# Patient Record
Sex: Female | Born: 1935 | Race: White | Hispanic: No | Marital: Married | Smoking: Never smoker
Health system: Southern US, Community
[De-identification: ages and names within clinical notes are randomized; demographics above are authoritative.]

## PROBLEM LIST (undated history)

## (undated) DIAGNOSIS — I2699 Other pulmonary embolism without acute cor pulmonale: Secondary | ICD-10-CM

## (undated) DIAGNOSIS — F32A Depression, unspecified: Secondary | ICD-10-CM

## (undated) DIAGNOSIS — L03032 Cellulitis of left toe: Secondary | ICD-10-CM

## (undated) DIAGNOSIS — E785 Hyperlipidemia, unspecified: Secondary | ICD-10-CM

## (undated) DIAGNOSIS — Q211 Atrial septal defect: Secondary | ICD-10-CM

## (undated) DIAGNOSIS — Z9289 Personal history of other medical treatment: Secondary | ICD-10-CM

## (undated) DIAGNOSIS — Q2112 Patent foramen ovale: Secondary | ICD-10-CM

## (undated) DIAGNOSIS — N189 Chronic kidney disease, unspecified: Secondary | ICD-10-CM

## (undated) DIAGNOSIS — L02612 Cutaneous abscess of left foot: Secondary | ICD-10-CM

## (undated) DIAGNOSIS — I10 Essential (primary) hypertension: Secondary | ICD-10-CM

## (undated) DIAGNOSIS — R002 Palpitations: Secondary | ICD-10-CM

## (undated) DIAGNOSIS — I639 Cerebral infarction, unspecified: Secondary | ICD-10-CM

## (undated) DIAGNOSIS — M1990 Unspecified osteoarthritis, unspecified site: Secondary | ICD-10-CM

## (undated) DIAGNOSIS — K219 Gastro-esophageal reflux disease without esophagitis: Secondary | ICD-10-CM

## (undated) DIAGNOSIS — K449 Diaphragmatic hernia without obstruction or gangrene: Secondary | ICD-10-CM

## (undated) DIAGNOSIS — F329 Major depressive disorder, single episode, unspecified: Secondary | ICD-10-CM

## (undated) HISTORY — DX: Palpitations: R00.2

## (undated) HISTORY — DX: Atrial septal defect: Q21.1

## (undated) HISTORY — DX: Personal history of other medical treatment: Z92.89

## (undated) HISTORY — PX: CHOLECYSTECTOMY: SHX55

## (undated) HISTORY — DX: Diaphragmatic hernia without obstruction or gangrene: K44.9

## (undated) HISTORY — DX: Hyperlipidemia, unspecified: E78.5

## (undated) HISTORY — DX: Patent foramen ovale: Q21.12

---

## 2000-04-29 ENCOUNTER — Encounter: Admission: RE | Admit: 2000-04-29 | Discharge: 2000-04-29 | Payer: Self-pay | Admitting: *Deleted

## 2000-04-29 ENCOUNTER — Encounter: Payer: Self-pay | Admitting: *Deleted

## 2001-05-05 ENCOUNTER — Encounter: Payer: Self-pay | Admitting: *Deleted

## 2001-05-05 ENCOUNTER — Encounter: Admission: RE | Admit: 2001-05-05 | Discharge: 2001-05-05 | Payer: Self-pay | Admitting: *Deleted

## 2001-07-23 ENCOUNTER — Ambulatory Visit (HOSPITAL_COMMUNITY): Admission: RE | Admit: 2001-07-23 | Discharge: 2001-07-23 | Payer: Self-pay | Admitting: Family Medicine

## 2001-07-23 ENCOUNTER — Other Ambulatory Visit: Admission: RE | Admit: 2001-07-23 | Discharge: 2001-07-23 | Payer: Self-pay | Admitting: Family Medicine

## 2001-07-23 ENCOUNTER — Encounter: Payer: Self-pay | Admitting: Family Medicine

## 2001-07-24 ENCOUNTER — Encounter: Payer: Self-pay | Admitting: Family Medicine

## 2001-07-24 ENCOUNTER — Ambulatory Visit (HOSPITAL_COMMUNITY): Admission: RE | Admit: 2001-07-24 | Discharge: 2001-07-24 | Payer: Self-pay | Admitting: Family Medicine

## 2001-08-01 ENCOUNTER — Encounter (INDEPENDENT_AMBULATORY_CARE_PROVIDER_SITE_OTHER): Payer: Self-pay | Admitting: Specialist

## 2001-08-01 ENCOUNTER — Other Ambulatory Visit: Admission: RE | Admit: 2001-08-01 | Discharge: 2001-08-01 | Payer: Self-pay | Admitting: *Deleted

## 2001-10-13 ENCOUNTER — Ambulatory Visit (HOSPITAL_COMMUNITY): Admission: RE | Admit: 2001-10-13 | Discharge: 2001-10-13 | Payer: Self-pay | Admitting: Gastroenterology

## 2002-02-04 ENCOUNTER — Ambulatory Visit (HOSPITAL_COMMUNITY): Admission: RE | Admit: 2002-02-04 | Discharge: 2002-02-04 | Payer: Self-pay | Admitting: Family Medicine

## 2002-02-04 ENCOUNTER — Encounter: Payer: Self-pay | Admitting: Family Medicine

## 2002-05-12 ENCOUNTER — Encounter: Payer: Self-pay | Admitting: Family Medicine

## 2002-05-12 ENCOUNTER — Encounter: Admission: RE | Admit: 2002-05-12 | Discharge: 2002-05-12 | Payer: Self-pay | Admitting: Family Medicine

## 2002-07-17 ENCOUNTER — Emergency Department (HOSPITAL_COMMUNITY): Admission: EM | Admit: 2002-07-17 | Discharge: 2002-07-17 | Payer: Self-pay | Admitting: Emergency Medicine

## 2002-09-29 ENCOUNTER — Encounter: Payer: Self-pay | Admitting: Family Medicine

## 2002-09-29 ENCOUNTER — Ambulatory Visit (HOSPITAL_COMMUNITY): Admission: RE | Admit: 2002-09-29 | Discharge: 2002-09-29 | Payer: Self-pay | Admitting: Family Medicine

## 2002-10-03 HISTORY — PX: CARDIAC CATHETERIZATION: SHX172

## 2002-10-05 ENCOUNTER — Ambulatory Visit (HOSPITAL_COMMUNITY): Admission: RE | Admit: 2002-10-05 | Discharge: 2002-10-05 | Payer: Self-pay | Admitting: Family Medicine

## 2002-10-26 ENCOUNTER — Ambulatory Visit (HOSPITAL_COMMUNITY): Admission: RE | Admit: 2002-10-26 | Discharge: 2002-10-26 | Payer: Self-pay | Admitting: Cardiology

## 2002-10-26 ENCOUNTER — Encounter: Payer: Self-pay | Admitting: Cardiology

## 2002-11-12 ENCOUNTER — Ambulatory Visit (HOSPITAL_COMMUNITY): Admission: RE | Admit: 2002-11-12 | Discharge: 2002-11-12 | Payer: Self-pay | Admitting: *Deleted

## 2003-05-14 ENCOUNTER — Encounter: Payer: Self-pay | Admitting: Family Medicine

## 2003-05-14 ENCOUNTER — Encounter: Admission: RE | Admit: 2003-05-14 | Discharge: 2003-05-14 | Payer: Self-pay | Admitting: Family Medicine

## 2003-08-18 ENCOUNTER — Encounter: Payer: Self-pay | Admitting: Family Medicine

## 2003-08-18 ENCOUNTER — Ambulatory Visit (HOSPITAL_COMMUNITY): Admission: RE | Admit: 2003-08-18 | Discharge: 2003-08-18 | Payer: Self-pay | Admitting: Family Medicine

## 2003-08-23 ENCOUNTER — Ambulatory Visit (HOSPITAL_COMMUNITY): Admission: RE | Admit: 2003-08-23 | Discharge: 2003-08-23 | Payer: Self-pay | Admitting: Family Medicine

## 2003-08-23 ENCOUNTER — Encounter: Payer: Self-pay | Admitting: Family Medicine

## 2003-12-16 ENCOUNTER — Inpatient Hospital Stay (HOSPITAL_COMMUNITY): Admission: AD | Admit: 2003-12-16 | Discharge: 2003-12-18 | Payer: Self-pay | Admitting: Family Medicine

## 2003-12-20 ENCOUNTER — Ambulatory Visit (HOSPITAL_COMMUNITY): Admission: RE | Admit: 2003-12-20 | Discharge: 2003-12-20 | Payer: Self-pay | Admitting: Family Medicine

## 2003-12-24 ENCOUNTER — Ambulatory Visit (HOSPITAL_BASED_OUTPATIENT_CLINIC_OR_DEPARTMENT_OTHER): Admission: RE | Admit: 2003-12-24 | Discharge: 2003-12-24 | Payer: Self-pay | Admitting: Urology

## 2004-05-15 ENCOUNTER — Encounter: Admission: RE | Admit: 2004-05-15 | Discharge: 2004-05-15 | Payer: Self-pay | Admitting: Family Medicine

## 2004-09-13 ENCOUNTER — Ambulatory Visit (HOSPITAL_COMMUNITY): Admission: RE | Admit: 2004-09-13 | Discharge: 2004-09-13 | Payer: Self-pay | Admitting: Family Medicine

## 2005-05-16 ENCOUNTER — Encounter: Admission: RE | Admit: 2005-05-16 | Discharge: 2005-05-16 | Payer: Self-pay | Admitting: Family Medicine

## 2005-09-26 ENCOUNTER — Ambulatory Visit (HOSPITAL_COMMUNITY): Admission: RE | Admit: 2005-09-26 | Discharge: 2005-09-26 | Payer: Self-pay | Admitting: Family Medicine

## 2006-05-20 ENCOUNTER — Encounter: Admission: RE | Admit: 2006-05-20 | Discharge: 2006-05-20 | Payer: Self-pay | Admitting: Family Medicine

## 2006-10-01 ENCOUNTER — Ambulatory Visit (HOSPITAL_COMMUNITY): Admission: RE | Admit: 2006-10-01 | Discharge: 2006-10-01 | Payer: Self-pay | Admitting: Family Medicine

## 2006-10-16 ENCOUNTER — Encounter (INDEPENDENT_AMBULATORY_CARE_PROVIDER_SITE_OTHER): Payer: Self-pay | Admitting: Specialist

## 2006-10-16 ENCOUNTER — Ambulatory Visit (HOSPITAL_COMMUNITY): Admission: RE | Admit: 2006-10-16 | Discharge: 2006-10-16 | Payer: Self-pay | Admitting: Gastroenterology

## 2006-12-03 DIAGNOSIS — Z9289 Personal history of other medical treatment: Secondary | ICD-10-CM

## 2006-12-03 HISTORY — DX: Personal history of other medical treatment: Z92.89

## 2007-05-22 ENCOUNTER — Encounter: Admission: RE | Admit: 2007-05-22 | Discharge: 2007-05-22 | Payer: Self-pay | Admitting: Family Medicine

## 2007-09-14 ENCOUNTER — Emergency Department (HOSPITAL_COMMUNITY): Admission: EM | Admit: 2007-09-14 | Discharge: 2007-09-14 | Payer: Self-pay | Admitting: Emergency Medicine

## 2007-09-19 ENCOUNTER — Ambulatory Visit (HOSPITAL_COMMUNITY): Admission: RE | Admit: 2007-09-19 | Discharge: 2007-09-19 | Payer: Self-pay | Admitting: Family Medicine

## 2007-09-23 ENCOUNTER — Encounter (HOSPITAL_COMMUNITY): Admission: RE | Admit: 2007-09-23 | Discharge: 2007-10-23 | Payer: Self-pay | Admitting: Family Medicine

## 2007-10-13 DIAGNOSIS — Z9289 Personal history of other medical treatment: Secondary | ICD-10-CM

## 2007-10-13 HISTORY — DX: Personal history of other medical treatment: Z92.89

## 2007-10-15 ENCOUNTER — Observation Stay (HOSPITAL_COMMUNITY): Admission: RE | Admit: 2007-10-15 | Discharge: 2007-10-16 | Payer: Self-pay | Admitting: General Surgery

## 2007-10-15 ENCOUNTER — Encounter (INDEPENDENT_AMBULATORY_CARE_PROVIDER_SITE_OTHER): Payer: Self-pay | Admitting: General Surgery

## 2007-11-24 ENCOUNTER — Ambulatory Visit (HOSPITAL_COMMUNITY): Admission: RE | Admit: 2007-11-24 | Discharge: 2007-11-24 | Payer: Self-pay | Admitting: Family Medicine

## 2008-05-24 ENCOUNTER — Encounter: Admission: RE | Admit: 2008-05-24 | Discharge: 2008-05-24 | Payer: Self-pay | Admitting: Family Medicine

## 2009-05-25 ENCOUNTER — Encounter: Admission: RE | Admit: 2009-05-25 | Discharge: 2009-05-25 | Payer: Self-pay | Admitting: Family Medicine

## 2010-01-09 ENCOUNTER — Ambulatory Visit (HOSPITAL_COMMUNITY): Admission: RE | Admit: 2010-01-09 | Discharge: 2010-01-09 | Payer: Self-pay | Admitting: Family Medicine

## 2010-05-29 ENCOUNTER — Encounter: Admission: RE | Admit: 2010-05-29 | Discharge: 2010-05-29 | Payer: Self-pay | Admitting: Family Medicine

## 2010-12-18 ENCOUNTER — Emergency Department (HOSPITAL_COMMUNITY)
Admission: EM | Admit: 2010-12-18 | Discharge: 2010-12-18 | Payer: Self-pay | Source: Home / Self Care | Admitting: Emergency Medicine

## 2010-12-20 LAB — BASIC METABOLIC PANEL
BUN: 16 mg/dL (ref 6–23)
CO2: 27 mEq/L (ref 19–32)
Calcium: 9.4 mg/dL (ref 8.4–10.5)
Chloride: 106 mEq/L (ref 96–112)
Creatinine, Ser: 0.89 mg/dL (ref 0.4–1.2)
GFR calc Af Amer: >60 mL/min (ref >60)
GFR calc non Af Amer: >60 mL/min (ref >60)
Glucose, Bld: 114 mg/dL — ABNORMAL HIGH (ref 70–99)
Potassium: 4.3 mEq/L (ref 3.5–5.1)
Sodium: 139 mEq/L (ref 135–145)

## 2010-12-20 LAB — DIFFERENTIAL
Basophils Absolute: 0 10*3/uL (ref 0.0–0.1)
Basophils Relative: 0 % (ref 0–1)
Eosinophils Absolute: 0.1 10*3/uL (ref 0.0–0.7)
Eosinophils Relative: 1 % (ref 0–5)
Lymphocytes Relative: 14 % (ref 12–46)
Lymphs Abs: 1.2 10*3/uL (ref 0.7–4.0)
Monocytes Absolute: 0.4 10*3/uL (ref 0.1–1.0)
Monocytes Relative: 4 % (ref 3–12)
Neutro Abs: 6.8 10*3/uL (ref 1.7–7.7)
Neutrophils Relative %: 81 % — ABNORMAL HIGH (ref 43–77)

## 2010-12-20 LAB — URINALYSIS, ROUTINE W REFLEX MICROSCOPIC
Bilirubin Urine: NEGATIVE
Leukocytes, UA: NEGATIVE
Nitrite: NEGATIVE
Protein, ur: 30 mg/dL — AB
Specific Gravity, Urine: >1.03 — ABNORMAL HIGH (ref 1.005–1.030)
Urine Glucose, Fasting: NEGATIVE mg/dL
Urobilinogen, UA: 0.2 mg/dL (ref 0.0–1.0)
pH: 5.5 (ref 5.0–8.0)

## 2010-12-20 LAB — CBC
HCT: 36.4 % (ref 36.0–46.0)
Hemoglobin: 12.4 g/dL (ref 12.0–15.0)
MCH: 31.4 pg (ref 26.0–34.0)
MCHC: 34.1 g/dL (ref 30.0–36.0)
MCV: 92.2 fL (ref 78.0–100.0)
Platelets: 204 10*3/uL (ref 150–400)
RBC: 3.95 MIL/uL (ref 3.87–5.11)
RDW: 12.3 % (ref 11.5–15.5)
WBC: 8.5 10*3/uL (ref 4.0–10.5)

## 2010-12-20 LAB — URINE MICROSCOPIC-ADD ON

## 2011-04-05 ENCOUNTER — Ambulatory Visit (HOSPITAL_COMMUNITY)
Admission: RE | Admit: 2011-04-05 | Discharge: 2011-04-05 | Disposition: A | Discharge status: Home / Self Care | Payer: Medicare Other | Source: Ambulatory Visit | Attending: Gastroenterology | Admitting: Gastroenterology

## 2011-04-05 DIAGNOSIS — K219 Gastro-esophageal reflux disease without esophagitis: Secondary | ICD-10-CM | POA: Insufficient documentation

## 2011-04-05 DIAGNOSIS — Z79899 Other long term (current) drug therapy: Secondary | ICD-10-CM | POA: Insufficient documentation

## 2011-04-05 DIAGNOSIS — K2960 Other gastritis without bleeding: Secondary | ICD-10-CM | POA: Insufficient documentation

## 2011-04-05 DIAGNOSIS — E7800 Pure hypercholesterolemia, unspecified: Secondary | ICD-10-CM | POA: Insufficient documentation

## 2011-04-05 DIAGNOSIS — Z7982 Long term (current) use of aspirin: Secondary | ICD-10-CM | POA: Insufficient documentation

## 2011-04-05 DIAGNOSIS — M1990 Unspecified osteoarthritis, unspecified site: Secondary | ICD-10-CM | POA: Insufficient documentation

## 2011-04-05 DIAGNOSIS — R195 Other fecal abnormalities: Secondary | ICD-10-CM | POA: Insufficient documentation

## 2011-04-05 DIAGNOSIS — R1310 Dysphagia, unspecified: Secondary | ICD-10-CM | POA: Insufficient documentation

## 2011-04-05 DIAGNOSIS — I10 Essential (primary) hypertension: Secondary | ICD-10-CM | POA: Insufficient documentation

## 2011-04-05 DIAGNOSIS — K222 Esophageal obstruction: Secondary | ICD-10-CM | POA: Insufficient documentation

## 2011-04-15 NOTE — Op Note (Signed)
Cassandra [name], [name] NO.:  [id number]  MEDICAL RECORD NO.:  [id number]           PATIENT TYPE:  O  LOCATION:  WLEN                         FACILITY:  [facility]  PHYSICIAN:  Danise Edge, M.D.   DATE OF BIRTH:  May 26, 1933  DATE OF PROCEDURE:  04/05/2011 DATE OF DISCHARGE:                              OPERATIVE REPORT   PROCEDURES DONE:  Esophagogastroduodenoscopy with esophageal stricture dilation and diagnostic colonoscopy.  REFERRING PHYSICIAN:  Edsel Petrin, D.O.  HISTORY:  [name] is a 77 year old female born 12-30-1932. The patient has chronic gastroesophageal reflux disease associated with a benign peptic stricture at the esophagogastric junction which required esophageal dilation in 2007.  In 2002, the patient underwent a normal screening colonoscopy.  The patient is experiencing intermittent solid food esophageal dysphagia and is scheduled to undergo an esophagogastroduodenoscopy with balloon dilation of the distal esophageal stricture.  She recently underwent her complete physical examination.  She submitted stool for Hemoccult testing.  Her stool returned positive for blood. She reports no difficulty having bowel movements and reports no blood in her stool.  The patient chronically takes aspirin 81 mg daily and naproxen 500 mg twice daily.  There is no history of peptic ulcer disease.  ENDOSCOPIST:  Danise Edge, M.D.  PREMEDICATIONS:  Propofol administered by Anesthesia.  PROCEDURE:  Esophagogastroduodenoscopy:  The patient was placed in the left lateral decubitus position.  The Pentax gastroscope was passed through the posterior hypopharynx into the proximal esophagus without difficulty.  The hypopharynx, larynx and vocal cords appeared normal.  Esophagoscopy:  The proximal and mid segments of the esophagus appeared normal.  There is a shallow superficial benign peptic stricture at the esophagogastric junction noted  at approximately 40 cm from the incisor teeth.  There is no endoscopic evidence for the presence of erosive esophagitis or Barrett's esophagus.  The esophageal stricture was dilated to 16.5 mm using the CRE balloon dilator.  Gastroscopy:  Retroflex view of the gastric cardia and fundus was normal.  The gastric body appeared normal.  In the gastric antrum, there were a few shallow prepyloric gastric erosions.  The pylorus appears normal.  Duodenoscopy:  The duodenal bulb and descending duodenum appeared normal.  ASSESSMENT: 1. Benign peptic stricture at the esophagogastric junction, dilated to     16.5 mm with the CRE balloon dilator. 2. Several small superficial mucosal erosions are noted in the     prepyloric gastric antrum probably related to the use of aspirin     and naproxen.  PROCEDURE:  Diagnostic colonoscopy:  Anal inspection and digital rectal exam were normal.  The Pentax pediatric colonoscope was introduced into the rectum and easily advanced to the cecum.  A normal-appearing ileocecal valve and appendiceal orifice were identified.  Colonic preparation for the exam today was good.  Rectum normal:  Retroflex view of the distal rectum normal. Sigmoid colon and descending colon normal. Splenic flexure normal. Transverse colon normal. Hepatic flexure normal. Ascending colon normal. Cecum and ileocecal valve normal.  ASSESSMENT:  Normal proctocolonoscopy to the cecum.  RECOMMENDATIONS:  Repeat screening colonoscopy is probably not necessary.  ______________________________ Danise Edge, M.D.     MJ/MEDQ  D:  04/05/2011  T:  04/05/2011  Job:  [id number]  cc:   Edsel Petrin, D.O. Fax: [phone]  Electronically Signed by Danise Edge M.D. on 04/15/2011 08:53:09 AM

## 2011-04-17 NOTE — Op Note (Signed)
NAMEARNICE, VANEPPS NO.:  [id number]   MEDICAL RECORD NO.:  [id number]          PATIENT TYPE:  OBV   LOCATION:  A339                          FACILITY:  [facility]   PHYSICIAN:  Dalia Heading, M.D.  DATE OF BIRTH:  Aug 05, 1933   DATE OF PROCEDURE:  10/15/2007  DATE OF DISCHARGE:                               OPERATIVE REPORT   PREOPERATIVE DIAGNOSIS:  Chronic cholecystitis.   POSTOPERATIVE DIAGNOSIS:  Chronic cholecystitis.   PROCEDURE:  Laparoscopic cholecystectomy.   SURGEON:  Franky Macho, M.D.   ANESTHESIA:  General endotracheal.   INDICATIONS:  The patient is a 77 year old white female who presents  with biliary colic secondary to chronic cholecystitis.  The risks and  benefits of the procedure including bleeding, infection, hepatobiliary  injury, and the possibly of an open procedure were fully explained to  the patient, who gave informed consent.   PROCEDURE NOTE:  The patient was placed in the supine position.  After  induction of general endotracheal anesthesia, the abdomen was prepped  and draped using the usual sterile technique with Betadine.  Surgical  site confirmation was performed.   An infraumbilical incision was made down to the fascia.  Veress needle  was introduced into the abdominal cavity and confirmation of placement  was done using the saline drop test.  The abdomen was then insufflated  to 16 mmHg pressure.  An 11 mm trocar was introduced into the abdominal  cavity under direct visualization without difficulty.  The patient was  placed in reverse Trendelenburg position.  An additional 11 mm trocar  was placed in the epigastric region and 5 mm trocars placed in the right  upper quadrant and right flank regions.  The liver was inspected and  noted to be within normal limits.  The gallbladder was retracted  superior and laterally.  The dissection was begun around the  infundibulum of the gallbladder.  The cystic duct was first  identified.  Juncture to the infundibulum fully identified.  Endo clips were placed  proximally and distally on the cystic duct and the cystic duct was  divided.  This likewise done on the cystic artery.  The gallbladder was  then freed away from the gallbladder fossa using Bovie electrocautery.  The gallbladder was delivered through the epigastric trocar site using  EndoCatch bag.  The gallbladder fossa was inspected.  No abnormal  bleeding or bile leakage was noted.  Surgicel was placed in the  gallbladder fossa.  All fluid and air were then evacuated from the  abdominal cavity prior to removal of the trocars.   All wounds were irrigated with normal saline.  All wounds were injected  with 0.5% Sensorcaine.  The infraumbilical fascia was reapproximated  using an 0 Vicryl interrupted suture.  All skin incisions were closed  using staples.  Betadine ointment and dry sterile dressings were  applied.   All tape and needle counts correct at the end of the procedure.  The  patient was extubated in the operating room and went back to recovery  room awake in stable condition.   COMPLICATIONS:  None.  SPECIMEN:  Gallbladder.   BLOOD LOSS:  Minimal.      Dalia Heading, M.D.  Electronically Signed     MAJ/MEDQ  D:  10/15/2007  T:  10/16/2007  Job:  [id number]   cc:   Dalia Heading, M.D.  Fax: [phone]   Dr. Venia Carbon, M.D.  Fax: [phone]

## 2011-04-17 NOTE — H&P (Signed)
NAMEAMBERLY, Cassandra [name]             ACCOUNT NO.:  [id number]   MEDICAL RECORD NO.:  [id number]          PATIENT TYPE:  AMB   LOCATION:  DAY                           FACILITY:  [facility]   PHYSICIAN:  Dalia Heading, M.D.  DATE OF BIRTH:  [birth year]   DATE OF ADMISSION:  DATE OF DISCHARGE:  LH                              HISTORY & PHYSICAL   CHIEF COMPLAINT:  Chronic cholecystitis.   HISTORY OF PRESENT ILLNESS:  The patient is a 77 year old white female  who was referred for evaluation and treatment of biliary colic secondary  to chronic cholecystitis.  She has been having right upper quadrant  abdominal pain, nausea, bloating for many weeks.  She does have fatty  food intolerance.  No fever, chills or jaundice had been noted.   PAST MEDICAL HISTORY:  1. Coronary artery disease.  2. Hypertension.  3. Chronic back pain.   PAST SURGICAL HISTORY:  Unremarkable.   CURRENT MEDICATIONS:  1. Metoprolol.  2. Nitrofurantoin.  3. Tramadol.  4. Promethazine.  5. Naprosyn.  6. Baby aspirin.   ALLERGIES:  No known drug allergies.   REVIEW OF SYSTEMS:  The patient denies drinking or smoking.  She denies  any recent chest pain, MI, CVA or diabetes mellitus.  She denies any  bleeding disorders.   PHYSICAL EXAMINATION:  GENERAL:  The patient is a well-developed, well-  nourished white female in no acute distress.  HEENT:  Examination reveals no scleral icterus.  LUNGS:  Clear to auscultation with equal breath sounds bilaterally.  HEART:  Examination reveals a regular rate and rhythm without S3, S4 or  murmurs.  ABDOMEN:  Soft and nondistended.  She is tender in the right upper  quadrant to palpation.  No hepatosplenomegaly, masses or hernias  identified.   DIAGNOSTIC DATA:  HIDA scan reveals chronic cholecystitis with a low  gallbladder ejection fraction.   IMPRESSION:  Chronic cholecystitis.   PLAN:  The patient is scheduled for laparoscopic cholecystectomy on  October 15, 2007.   The risks and benefits of the procedure including  bleeding, infection, hepatobiliary injury and the possibility of an open  procedure were fully explained to the patient, who gave informed  consent.  She is having a cardiac stress test by Dr. Domingo Sep prior to  surgery.      Dalia Heading, M.D.  Electronically Signed     MAJ/MEDQ  D:  10/09/2007  T:  10/09/2007  Job:  [id number]   cc:   Dalia Heading, M.D.  Fax: [phone]   Patrica Duel, M.D.  Fax: [phone]   Dani Gobble, MD  Fax: [phone]

## 2011-04-20 NOTE — H&P (Signed)
NAME:  Cassandra [name], Cassandra [name]                       ACCOUNT NO.:  [id number]   MEDICAL RECORD NO.:  [id number]                   PATIENT TYPE:  INP   LOCATION:  A338                                 FACILITY:  [facility]   PHYSICIAN:  Patrica Duel, M.D.                 DATE OF BIRTH:  14-Nov-1933   DATE OF ADMISSION:  12/16/2003  DATE OF DISCHARGE:                                HISTORY & PHYSICAL   CHIEF COMPLAINT:  Nausea, vomiting, and diarrhea.   HISTORY OF PRESENT ILLNESS:  This 77 year old female presents to the office  today reporting onset of some right lower quadrant pain that felt similar to  gas on Tuesday.  However, the pain has since localized into the right flank  and is quite severe.  It was so severe yesterday she was putting ice packs  on her flank and was unable to move.  As well, through the course of the  last 72 hours she has had persistent nausea, vomiting and diarrhea.  She is  unable to keep down even p.o. liquids.  She has been running a fever; which,  today in the office is 101.2.  She reports that her last episode of vomiting  was around 12 p.m. and she continued to have diarrhea through the day today.  She also complains of intense urinary frequency and urgency, but no dysuria  or hematuria.  She had a colonoscopy in 2002 which was completely normal  with no evidence of diverticular disease.  She had abdominal ultrasound in  2004 which showed only mild fatty liver with no evidence of gallbladder  disease or biliary duct dilatation.  She has no previous history of  abdominal abscesses or surgery.  She still has her appendix intact.  She  does have a history of irritable bowel syndrome.   REVIEW OF SYSTEMS:  She denies any chest pain, shortness of breath, cough,  sore throat, runny nose, blood in her stools, headaches, syncope or rash.  Review of systems is otherwise negative.   PAST MEDICAL HISTORY:  Significant for osteoarthritis and irritable bowel  syndrome and  mild hypertension.   CURRENT MEDICATIONS:  1. Zantac 150 mg p.o. b.i.d.  2. Naprosyn 500 mg once or twice daily.  3. Xanax 0.5 mg as needed.  4. Aspirin 81 mg daily.  5. Metoprolol 50 mg 1/2 tablet p.o. b.i.d.   ALLERGIES:  No known drug allergies.   PAST SURGICAL HISTORY:  Noncontributory.   SOCIAL HISTORY:  The patient is retired and has no history of tobacco or  alcohol use.  She has been brought here today by her grandson.   FAMILY HISTORY:  Father deceased of leukemia and emphysema.  Mother has  passed away.  Had a history of heart surgery, thyroid disease and  hypertension.  Grandparents generally deceased of unknown causes with the  exception of her maternal grandmother who died at 39 of a hemorrhage and  then hypertension.  She has 2 half-sisters one of whom has had knee  replacement.  She has 3 daughters 1 of whom has hypertension.  There is no  history of colon, pelvic, or breast cancer within the family.   PHYSICAL EXAMINATION:  VITAL SIGNS:  Age 77, weight is 190.5 which is  decreased about 4 pounds since her physical in September.  Temperature is 101.2 orally, pulse is 92 and regular.  Blood pressure  140/80.  Height is 69.5 inches.  GENERAL:  Mild to moderately ill, but alert, pleasant, 77 year old well-  developed, well-nourished, healthy appearing Caucasian female.  HEENT AND NECK:  Her neck is supple without lymphadenopathy.  Mucous  membranes are moist.  Face is symmetric without redness or rash.  There is  no obvious facial droop.  Speech is intact and appropriate.  CHEST:  Clear to auscultation bilaterally without wheezes, rales or rhonchi  or crackles.  CARDIAC:  Regular rate and rhythm without murmur, rub, or gallop.  ABDOMEN:  Abdomen is fairly obese with hypoactive but present bowel sounds  in all quadrants. She has some mild tenderness in the right lower quadrant,  but no rebound or guarding. She has no other focal abdominal tenderness,  rebound,  rigidity, or distention.  She has no appreciable organomegaly.  She  does have exquisite right CVA tenderness with compression which is  reproducible.  RECTAL:  Exam reveals a very soft, brownish-orange stool that is Hemoccult-  negative with normal rectal tone.  GENITOURINARY:  Exam is deferred otherwise; it is not pertinent to present  illness.  SKIN:  Generally pale without any evidence of obvious rash.  NEUROLOGIC:  Gait is stable.  Affect is flat, slightly lethargic, but  appropriate and cooperative.   OFFICE COURSE:  Urinalysis reveals pH is 5, specific gravity 1.020 with  evidence of white blood cells, positive nitrate, trace protein, positive  blood, and microscopic exam reveals white and red blood cells too numerous  to count.   ASSESSMENT:  1. Urinary tract infection with clinical evidence of acute right     pyelonephritis.  2. History of right lower quadrant pain with mild tenderness.  3. Nausea, vomiting, diarrhea for 72 hours.  4. Probable mild dehydration.  5. Fever.   PLAN:  The patient is to be admitted for fluid resuscitation.  She will be  started on IV Levaquin 500 mg daily.  Pending results of urine, blood  cultures, CBC, metabolic panel.  Obtain a STAT CT scan of the abdomen and  pelvis with contrast as well as a renal protocol scan to rule out kidney  stones as the source of a possible infection of flank pain and also rule out  any other intra-abdominal infection or acute process.  Specifically we are  looking for appendicitis or colonic abscess.  She will continue all of her  routine home medications and will also be placed on the majority of [facility]  Standing Orders. She is also briefly seen n the office by Dr. Nobie Putnam who  will follow her during this admission.  Discharged from the office in fair  condition for immediate transport to [facility] with her grandson.    _____________________________________   ___________________________________________  Tarri Fuller, P.A.                   Patrica Duel, M.D.   JR/MEDQ  D:  12/16/2003  T:  12/16/2003  Job:  [id number]

## 2011-04-20 NOTE — Op Note (Signed)
NAME:  Cassandra [name], Cassandra [name]                       ACCOUNT NO.:  [id number]   MEDICAL RECORD NO.:  [id number]                   PATIENT TYPE:  AMB   LOCATION:  ENDO                                 FACILITY:  [facility]   PHYSICIAN:  Dani Gobble, M.D.                  DATE OF BIRTH:  Nov 09, 1933   DATE OF PROCEDURE:  11/12/2002  DATE OF DISCHARGE:                                 OPERATIVE REPORT   PROCEDURE:  Transesophageal echocardiography.   INDICATION:  Evaluation for the possibility of PFO/ASD.  The patient is a 77-  year-old female who was initially evaluated for chest discomfort.  She  underwent a nuclear stress test which was abnormal prompting cardiac  catheterization, which revealed normal coronary arteries and normal left  ventricular systolic function.  An echocardiogram revealed a quite mobile  interatrial septal aneurysm prompting this study for definitive delineation  of a possible PFO/ASD.  The patient was brought to the endoscopy suite in  the fasting state.  She was sedated with Versed and fentanyl per the  protocol sheet.  Upon completion of the procedure, the probe was removed.  There were no immediate complications.   RESULTS:  1. Normal left ventricular chamber size and systolic function.  2. Aortic valve is thin, trileaflet, and pliable.  No aortic insufficiency     is noted.  3. Mitral valve is structurally normal with trivial mitral regurgitation.  4. Pulmonic valve is structurally normal without pulmonic insufficiency     noted.  5. The tricuspid valve was structurally normal with mild tricuspid     regurgitation.  6. The right atrium and right ventricle are normal in structure and     function.  7. Mild basal septal thickening is appreciated.  8. The left atrium is at the upper limits of normal to mildly dilated.  9. The left atrial appendage is small and has normal velocity.  10.      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  11.      Positive bubble study with minimal right to left shunt via a small     PFO.  12.      The aorta is without significant atheroma.   RECOMMENDATIONS:  Would recommend no change in current medical therapy at  this time, specifically, continue aspirin at 325 mg p.o. daily with strict  blood pressure control.                                               Dani Gobble, M.D.    AB/MEDQ  D:  11/12/2002  T:  11/13/2002  Job:  [id number]   cc:   Corrie Mckusick, M.D.  [phone]  [address] Laurell Josephs. A  [place]  [place] [address]  Fax: [phone]

## 2011-04-20 NOTE — Discharge Summary (Signed)
NAME:  Cassandra [name], Cassandra [name]                       ACCOUNT NO.:  [id number]   MEDICAL RECORD NO.:  [id number]                   PATIENT TYPE:  INP   LOCATION:  A338                                 FACILITY:  [facility]   PHYSICIAN:  Patrica Duel, M.D.                 DATE OF BIRTH:  May 05, 1933   DATE OF ADMISSION:  12/16/2003  DATE OF DISCHARGE:                                 DISCHARGE SUMMARY   DISCHARGE DIAGNOSES:  Pyelonephritis/ureterolithiasis, symptoms resolved.  E. coli on urine culture.   HISTORY OF PRESENT ILLNESS:  (For details regarding admission, please refer  to admitting note).  Briefly, this 77 year old female presented to the  office with abdominal pain, nausea, vomiting and was found to have urinary  tract infection and a clinical pyelonephritis.  She was admitted for  definitive therapy.   HOSPITAL COURSE:  The patient was placed on IV fluids as well as Levaquin.  A urine culture was obtained which revealed E. coli.  She had a CT scan  which revealed obstructing 3 x 7 mm calculus, right distal ureter.  The  stone appearing to be partially within the bladder.  No other renal calculi  were noted.  She had a mild leukocytosis on admission and this has resolved  and currently, she is stable and doing very well and requesting discharge.  Her pain has resolved.   Of note, she has not passed the stone from her bladder at this time.  An  ultrasound will be obtained as an outpatient to confirm passage and  resolution of hydroureter.   DISCHARGE MEDICATION:  Levaquin 500 mg daily.   FOLLOWUP:  She will be followed as an outpatient.     ___________________________________________                                         Patrica Duel, M.D.   MC/MEDQ  D:  12/18/2003  T:  12/18/2003  Job:  [id number]

## 2011-04-20 NOTE — Procedures (Signed)
[facility]  Patient:    Cassandra [name], Cassandra [name] Visit Number: [id number] MRN: [id number]          Service Type: Attending:  Verlin Grills, M.D. Dictated by:   Verlin Grills, M.D. Proc. Date: 10/13/01   CC:         Dorthey Sawyer, M.D., [facility], [address], [place] [address]                           Procedure Report  PROCEDURE:  Colonoscopy  ENDOSCOPIST: Verlin Grills, M.D.  PROCEDURE INDICATIONS:  Cassandra [name] (date of birth [birth year]) is a 77 year old female who is due for first screening colonoscopy with polypectomy to prevent colon cancer.  I explained to Cassandra [name] the complications associated with colonoscopy and polypectomy including a 15/1000 risk of bleeding and 03/999 risk of colon perforation requiring surgical repair.  Cassandra [name] has signed the operative permit.  PREMEDICATIONS:  Versed 7.5 mg, Demerol 50 mg.  ENDOSCOPE:  Olympus pediatric colonoscope.  DESCRIPTION OF PROCEDURE:  After obtaining informed consent the patient was placed in the left lateral decubitus position.  She was administered intravenous Demerol and intravenous Versed to achieve conscious sedation for the procedure. The patients blood pressure, oxygen saturation, and cardiac rhythm were monitored throughout the procedure and documented in the medical record.  Anal inspection was normal.  Digital rectal examination was normal.  The Olympus pediatric videocolonoscope was introduced into the rectum and easily advanced to the cecum with the patient remaining in the left lateral decubitus position.  Colonic preparation for the exam today was satisfactory.  Ms. Monds took the Visicol colonic preparation.  Rectum:  Normal.  Sigmoid colon and descending colon:  Normal.  Splenic Flexure:  Normal.  Transverse Colon:  Normal.  Hepatic Flexure:  Normal.  Ascending Colon:  Normal.  Cecum and ileocecal  valve:  Normal.  ASSESSMENT:  Normal proctocolonoscopy to the cecum.  No endoscopic evidence for the presence of colorectal neoplasia. Dictated by:   Verlin Grills, M.D. Attending:  Verlin Grills, M.D. DD:  10/13/01 TD:  10/14/01 Job: [id number]

## 2011-04-20 NOTE — Op Note (Signed)
Cassandra [name], Cassandra [name] NO.:  [id number]   MEDICAL RECORD NO.:  [id number]          PATIENT TYPE:  AMB   LOCATION:  ENDO                         FACILITY:  [facility]   PHYSICIAN:  Danise Edge, M.D.   DATE OF BIRTH:  12-07-1932   DATE OF PROCEDURE:  10/16/2006  DATE OF DISCHARGE:                                 OPERATIVE REPORT   PROCEDURE:  Esophagogastroduodenoscopy.   REFERRING PHYSICIAN:  Corrie Mckusick, M.D.   PROCEDURE INDICATIONS:  [name] is a 77 year old female born  1933/07/10.  Cassandra [name] has had intermittent solid food dysphagia  localized to the mid retrosternal area for years.  She does have  gastroesophageal reflux associated with a hiatal hernia and has been taking  Zantac to control her reflux symptoms.  On occasion, the obstructing bolus  Mobic on will pass with simply consuming liquids; on other occasions, she  has had to regurgitate the obstructing food bolus.  She denies weight loss  or gastrointestinal bleeding.  On October 13, 2001, her screening  proctocolonoscopy to the cecum was normal.   ALLERGIES:  CODEINE.   CHRONIC MEDICATIONS:  Aspirin, metoprolol, Zantac, Naprosyn.   PAST MEDICAL HISTORY:  Hypertension, gastroesophageal reflux disease, hiatal  hernia, osteoarthritis, remote hysterectomy.   ENDOSCOPIST:  Danise Edge, M.D.   PREMEDICATION:  Fentanyl 50 mcg, Versed 6 mg.   PROCEDURE:  After obtaining informed consent, Cassandra [name] was placed in  the left lateral decubitus position on the fluoroscopy table.  I  administered intravenous fentanyl and intravenous Versed to achieve  conscious sedation for the procedure.  The patient's blood pressure, oxygen  saturation and cardiac rhythm were monitored throughout the procedure and  documented in the medical record.   The Olympus gastroscope was passed through the posterior hypopharynx into  the proximal esophagus without difficulty.  The hypopharynx, larynx  and  vocal cords appeared normal.   Esophagoscopy:  The proximal, mid, and lower segments of the esophageal  mucosa appeared normal except for the presence of a benign peptic stricture  at the esophagogastric junction.   Gastroscopy:  Cassandra [name] has a small hiatal hernia.  Retroflex view of  the gastric cardia and fundus was normal.  The gastric body appeared normal.  There are scattered erosions without deep ulceration in the gastric antrum  probably due to her nonsteroidal anti-inflammatory medication.  The pylorus  is normal.  Biopsies were taken from the distal gastric antrum.   Duodenoscopy:  The duodenal bulb and descending duodenum appear normal.   Savary esophageal dilation:  The Savary dilator wire was passed through the  gastroscope and advanced to the distal gastric antrum as confirmed  endoscopically and fluoroscopically.  Under fluoroscopic guidance, the 15-mm  Savary dilator passed without resistance.  Repeat esophagogastrostomy  confirmed satisfactory dilation of the benign peptic stricture at the  esophagogastric junction and no gastric trauma due to the guidewire.   ASSESSMENT:  1. Gastroesophageal reflux associated with a benign peptic stricture at      the esophagogastric junction and a small hiatal hernia.  The 15-mm  Savary dilator was passed to dilate the benign peptic stricture.  2. Erosions in the distal gastric antrum probably due to aspirin and      Naprosyn.  Biopsies to rule out H pylori are pending.           ______________________________  Danise Edge, M.D.     MJ/MEDQ  D:  10/16/2006  T:  10/16/2006  Job:  [id number]   cc:   Corrie Mckusick, M.D.

## 2011-04-20 NOTE — Cardiovascular Report (Signed)
NAME:  Cassandra [name], Cassandra [name]                       ACCOUNT NO.:  [id number]   MEDICAL RECORD NO.:  [id number]                   PATIENT TYPE:  OIB   LOCATION:  2899                                 FACILITY:  [facility]   PHYSICIAN:  Nanetta Batty, M.D.                DATE OF BIRTH:  05-18-1933   DATE OF PROCEDURE:  DATE OF DISCHARGE:                              CARDIAC CATHETERIZATION   CLINICAL HISTORY:  The patient is a 77 year old white female patient of Drs.  Domingo Sep and Phillips Odor.  She has a history of chest pain with borderline  positive Cardiolite stress test and a long history of dyspnea.  Other  problems include hypertension.  There is a question of atrial septal defect  versus aneurysm.  The patient presents now for diagnostic coronary  arteriography to rule out an ischemic etiology.   DESCRIPTION OF PROCEDURE:  The patient was brought to the second floor [facility] in the postabsorptive state.  After  premedication with p.o. Valium, the right groin was prepped and shaved in  the usual sterile fashion.  Xylocaine, 1%, was used for local anesthesia.  A  #6 French sheath was inserted into the right femoral artery using the  standard Seldinger technique.  The #6 French right and left Judkins catheter  along with a #6 French pigtail catheter were used for selective coronary  angiography and left ventriculography, respectively.  Omnipaque dye was used  for the entirety of the case.  Retrograde aortic, left ventricular, and  pullback pressures were recorded.   HEMODYNAMICS:  1. Aortic systolic pressure 132, diastolic pressure 68.  2. Left ventricular systolic pressure 134, end diastolic pressure 4.   SELECTIVE CORONARY ANGIOGRAPHY:  1. Left main:  Normal.  2. LAD:  Normal.  3. Left circumflex:  Normal.  4. Right coronary artery:  Dominant and normal.  5. Left ventriculography:  RAO left ventriculogram using 25 cc of Omnipaque     dye at 12 cc/second.   There was an LVEF estimated at greater than 60%     without focal wall motion abnormalities.   IMPRESSION:  The patient has normal coronary arteries and normal left  ventricular function.  I believe her Cardiolite was false positive, possibly  related to breast attenuation and her chest pain is most likely noncardiac.   PLAN:  Discharge home later today and an outpatient transesophageal  echocardiogram to rule out ASD.   Sheaths were removed and pressure was held on the groin to achieve  hemostasis.  The patient left the lab in stable condition.                                                 Nanetta Batty, M.D.    Cordelia Pen  D:  10/26/2002  T:  10/26/2002  Job:  [id number]   cc:   Cardiac Catheterization Lab   [facility]   Dani Gobble, M.D.  [address]  [place]  [place] [address]  Fax: [phone]   Dorthey Sawyer, M.D.  [facility]  [place], [place]

## 2011-04-20 NOTE — Op Note (Signed)
NAME:  Cassandra [name], Cassandra [name]                       ACCOUNT NO.:  [id number]   MEDICAL RECORD NO.:  [id number]                   PATIENT TYPE:  AMB   LOCATION:  NESC                                 [facility]ACILITY:  [facility]   PHYSICIAN:  Boston Service, M.D.             DATE OF BIRTH:  [birth year]   DATE OF PROCEDURE:  12/24/2003  DATE OF DISCHARGE:                                 OPERATIVE REPORT   LOCAL MEDICAL DOCTOR:  Patrica Duel, MD.   PREOPERATIVE DIAGNOSES:  1. A 2-3 month history of atypical right-sided abdominal pain.  2. Long history of acute cystitis.  Recent x-rays demonstrated 7 mm calculus     impacted in the right distal ureter.   POSTOPERATIVE DIAGNOSES:  1. A 2-3 month history of atypical right-sided abdominal pain.  2. Long history of acute cystitis.  Recent x-rays demonstrated 7 mm calculus     impacted in the right distal ureter.   PROCEDURE:  1. Cystoscopy.  2. Retrograde.  3. Ureteroscopy.  4. Stone extraction.   ANESTHESIA:  General.   DRAINS:  None.   COMPLICATIONS:  None.   DESCRIPTION OF PROCEDURE:  The patient was prepped and draped in the dorsal  lithotomy position after institution of an adequate level of general  anesthesia.  Well-lubricated 21 French panendoscope was gently inserted at  the urethral meatus.  Normal urethra and sphincter.  Normal left ureteral  orifice.  Patient had obvious calcific density protruding from the right  ureteral orifice with edema and erythema of the surrounding urothelium.  Stone was unable to be manipulated.  Collins knife was selected, and a small  incision was made along the distal portion of the ureteral orifice for a  distance of probably 2-3 mm.  This released the tension on the stone, and  about a third of the stone could be seen to be protruding from the orifice.  It was gently grasped with alligator forceps and using a gentle rocking  motion, stone was extracted from the distal ureter and recovered intact.  Upon removal of the stone, there was immediate efflux of concentrated urine  at the right ureteral orifice.  Retrogrades showed considerable amount of  hydroureter and hydronephrosis which confirmed findings on CT scan.  Ureteroscope was inserted  without difficulty at the right ureteral orifice and advanced to a spot just  below the UPJ.  No additional stony fragments were identified within the  ureter.  The ureteroscope was withdrawn; bladder was drained.  The patient  was given a B&O suppository and returned to recovery in satisfactory  condition.                                               Boston Service, M.D.    RH/MEDQ  D:  12/24/2003  T:  12/24/2003  Job:  [id number]   cc:   Patrica Duel, M.D.  [address]  [place]  [place] [address]  Fax: [phone]

## 2011-04-26 ENCOUNTER — Other Ambulatory Visit: Payer: Self-pay | Admitting: Family Medicine

## 2011-04-26 DIAGNOSIS — Z1231 Encounter for screening mammogram for malignant neoplasm of breast: Secondary | ICD-10-CM

## 2011-05-31 ENCOUNTER — Ambulatory Visit
Admission: RE | Admit: 2011-05-31 | Discharge: 2011-05-31 | Disposition: A | Discharge status: Home / Self Care | Payer: Medicare Other | Source: Ambulatory Visit | Attending: Family Medicine | Admitting: Family Medicine

## 2011-05-31 DIAGNOSIS — Z1231 Encounter for screening mammogram for malignant neoplasm of breast: Secondary | ICD-10-CM

## 2011-09-08 ENCOUNTER — Other Ambulatory Visit (HOSPITAL_COMMUNITY): Payer: Self-pay | Admitting: Family Medicine

## 2011-09-08 ENCOUNTER — Ambulatory Visit (HOSPITAL_COMMUNITY)
Admission: RE | Admit: 2011-09-08 | Discharge: 2011-09-08 | Disposition: A | Discharge status: Home / Self Care | Payer: Medicare Other | Source: Ambulatory Visit | Attending: Family Medicine | Admitting: Family Medicine

## 2011-09-08 DIAGNOSIS — M25476 Effusion, unspecified foot: Secondary | ICD-10-CM | POA: Insufficient documentation

## 2011-09-08 DIAGNOSIS — M25579 Pain in unspecified ankle and joints of unspecified foot: Secondary | ICD-10-CM

## 2011-09-08 DIAGNOSIS — M25473 Effusion, unspecified ankle: Secondary | ICD-10-CM | POA: Insufficient documentation

## 2011-09-11 LAB — CBC
Hemoglobin: 12.6
MCHC: 33.7
MCV: 92.9
Platelets: 250
RDW: 12.9
WBC: 5.3

## 2011-09-11 LAB — BASIC METABOLIC PANEL
Calcium: 9.6
Chloride: 103

## 2011-09-11 LAB — HEPATIC FUNCTION PANEL
AST: 19
Albumin: 4
Bilirubin, Direct: 0.1
Total Protein: 6.4

## 2011-09-13 LAB — CBC
HCT: 39.3
Hemoglobin: 13.6
MCHC: 34.5
MCV: 92.9
RBC: 4.23
RDW: 12.3

## 2011-09-13 LAB — URINALYSIS, ROUTINE W REFLEX MICROSCOPIC
Ketones, ur: NEGATIVE
Protein, ur: NEGATIVE
Urobilinogen, UA: 1
pH: 5.5

## 2011-09-13 LAB — HEPATIC FUNCTION PANEL
Alkaline Phosphatase: 50
Indirect Bilirubin: 0.6

## 2011-09-13 LAB — I-STAT 8, (EC8 V) (CONVERTED LAB)
BUN: 17
Bicarbonate: 27.8 — ABNORMAL HIGH
Chloride: 107
Glucose, Bld: 107 — ABNORMAL HIGH
HCT: 41
Hemoglobin: 13.9
TCO2: 29
pCO2, Ven: 43 — ABNORMAL LOW

## 2011-09-13 LAB — URINE MICROSCOPIC-ADD ON

## 2011-09-13 LAB — DIFFERENTIAL
Eosinophils Absolute: 0.1
Lymphocytes Relative: 31

## 2011-10-09 ENCOUNTER — Other Ambulatory Visit (HOSPITAL_COMMUNITY): Payer: Self-pay | Admitting: Internal Medicine

## 2011-10-09 ENCOUNTER — Ambulatory Visit (HOSPITAL_COMMUNITY)
Admission: RE | Admit: 2011-10-09 | Discharge: 2011-10-09 | Disposition: A | Discharge status: Home / Self Care | Payer: Medicare Other | Source: Ambulatory Visit | Attending: Internal Medicine | Admitting: Internal Medicine

## 2011-10-09 DIAGNOSIS — M76899 Other specified enthesopathies of unspecified lower limb, excluding foot: Secondary | ICD-10-CM

## 2011-10-09 DIAGNOSIS — M79609 Pain in unspecified limb: Secondary | ICD-10-CM

## 2011-10-09 DIAGNOSIS — M25569 Pain in unspecified knee: Secondary | ICD-10-CM | POA: Insufficient documentation

## 2011-10-09 DIAGNOSIS — M25469 Effusion, unspecified knee: Secondary | ICD-10-CM | POA: Insufficient documentation

## 2012-01-16 ENCOUNTER — Other Ambulatory Visit: Payer: Self-pay

## 2012-01-16 ENCOUNTER — Emergency Department (HOSPITAL_COMMUNITY)
Admission: EM | Admit: 2012-01-16 | Discharge: 2012-01-16 | Disposition: A | Discharge status: Home / Self Care | Payer: Medicare Other | Attending: Emergency Medicine | Admitting: Emergency Medicine

## 2012-01-16 ENCOUNTER — Emergency Department (HOSPITAL_COMMUNITY): Payer: Medicare Other

## 2012-01-16 ENCOUNTER — Encounter (HOSPITAL_COMMUNITY): Payer: Self-pay | Admitting: *Deleted

## 2012-01-16 DIAGNOSIS — Z7982 Long term (current) use of aspirin: Secondary | ICD-10-CM | POA: Insufficient documentation

## 2012-01-16 DIAGNOSIS — I498 Other specified cardiac arrhythmias: Secondary | ICD-10-CM | POA: Insufficient documentation

## 2012-01-16 DIAGNOSIS — R51 Headache: Secondary | ICD-10-CM | POA: Insufficient documentation

## 2012-01-16 DIAGNOSIS — R109 Unspecified abdominal pain: Secondary | ICD-10-CM | POA: Insufficient documentation

## 2012-01-16 DIAGNOSIS — M129 Arthropathy, unspecified: Secondary | ICD-10-CM | POA: Insufficient documentation

## 2012-01-16 DIAGNOSIS — I10 Essential (primary) hypertension: Secondary | ICD-10-CM | POA: Insufficient documentation

## 2012-01-16 DIAGNOSIS — R42 Dizziness and giddiness: Secondary | ICD-10-CM

## 2012-01-16 HISTORY — DX: Essential (primary) hypertension: I10

## 2012-01-16 HISTORY — DX: Unspecified osteoarthritis, unspecified site: M19.90

## 2012-01-16 LAB — CBC
HCT: 35.8 % — ABNORMAL LOW (ref 36.0–46.0)
MCH: 31.8 pg (ref 26.0–34.0)
MCV: 94.2 fL (ref 78.0–100.0)
RDW: 12.7 % (ref 11.5–15.5)
WBC: 9.7 10*3/uL (ref 4.0–10.5)

## 2012-01-16 LAB — BASIC METABOLIC PANEL
BUN: 12 mg/dL (ref 6–23)
CO2: 30 mEq/L (ref 19–32)
Chloride: 103 mEq/L (ref 96–112)
Creatinine, Ser: 0.65 mg/dL (ref 0.50–1.10)

## 2012-01-16 LAB — DIFFERENTIAL
Basophils Absolute: 0 10*3/uL (ref 0.0–0.1)
Eosinophils Absolute: 0.1 10*3/uL (ref 0.0–0.7)
Eosinophils Relative: 1 % (ref 0–5)
Lymphocytes Relative: 20 % (ref 12–46)
Monocytes Absolute: 0.7 10*3/uL (ref 0.1–1.0)

## 2012-01-16 LAB — URINALYSIS, ROUTINE W REFLEX MICROSCOPIC
Bilirubin Urine: NEGATIVE
Glucose, UA: NEGATIVE mg/dL
Ketones, ur: NEGATIVE mg/dL
pH: 6.5 (ref 5.0–8.0)

## 2012-01-16 LAB — URINE MICROSCOPIC-ADD ON

## 2012-01-16 NOTE — Discharge Instructions (Signed)
A urine culture was sent. Your your doctor will hear from [place] if it grows bacteria not susceptible to medicine that you're on.    Arterial Hypertension Arterial hypertension (high blood pressure) is a condition of elevated pressure in your blood vessels. Hypertension over a long period of time is a risk factor for strokes, heart attacks, and heart failure. It is also the leading cause of kidney (renal) failure.  CAUSES   In Adults -- Over 90% of all hypertension has no known cause. This is called essential or primary hypertension. In the other 10% of people with hypertension, the increase in blood pressure is caused by another disorder. This is called secondary hypertension. Important causes of secondary hypertension are:   Heavy alcohol use.   Obstructive sleep apnea.   Hyperaldosterosim (Conn's syndrome).   Steroid use.   Chronic kidney failure.   Hyperparathyroidism.   Medications.   Renal artery stenosis.   Pheochromocytoma.   Cushing's disease.   Coarctation of the aorta.   Scleroderma renal crisis.   Licorice (in excessive amounts).   Drugs (cocaine, methamphetamine).  Your caregiver can explain any items above that apply to you.  In Children -- Secondary hypertension is more common and should always be considered.   Pregnancy -- Few women of childbearing age have high blood pressure. However, up to 10% of them develop hypertension of pregnancy. Generally, this will not harm the woman. It may be a sign of 3 complications of pregnancy: preeclampsia, HELLP syndrome, and eclampsia. Follow up and control with medication is necessary.  SYMPTOMS   This condition normally does not produce any noticeable symptoms. It is usually found during a routine exam.   Malignant hypertension is a late problem of high blood pressure. It may have the following symptoms:   Headaches.   Blurred vision.   End-organ damage (this means your kidneys, heart, lungs, and other organs are  being damaged).   Stressful situations can increase the blood pressure. If a person with normal blood pressure has their blood pressure go up while being seen by their caregiver, this is often termed "white coat hypertension." Its importance is not known. It may be related with eventually developing hypertension or complications of hypertension.   Hypertension is often confused with mental tension, stress, and anxiety.  DIAGNOSIS  The diagnosis is made by 3 separate blood pressure measurements. They are taken at least 1 week apart from each other. If there is organ damage from hypertension, the diagnosis may be made without repeat measurements. Hypertension is usually identified by having blood pressure readings:  Above 140/90 mmHg measured in both arms, at 3 separate times, over a couple weeks.   Over 130/80 mmHg should be considered a risk factor and may require treatment in patients with diabetes.  Blood pressure readings over 120/80 mmHg are called "pre-hypertension" even in non-diabetic patients. To get a true blood pressure measurement, use the following guidelines. Be aware of the factors that can alter blood pressure readings.  Take measurements at least 1 hour after caffeine.   Take measurements 30 minutes after smoking and without any stress. This is another reason to quit smoking - it raises your blood pressure.   Use a proper cuff size. Ask your caregiver if you are not sure about your cuff size.   Most home blood pressure cuffs are automatic. They will measure systolic and diastolic pressures. The systolic pressure is the pressure reading at the start of sounds. Diastolic pressure is the pressure at which  the sounds disappear. If you are elderly, measure pressures in multiple postures. Try sitting, lying or standing.   Sit at rest for a minimum of 5 minutes before taking measurements.   You should not be on any medications like decongestants. These are found in many cold  medications.   Record your blood pressure readings and review them with your caregiver.  If you have hypertension:  Your caregiver may do tests to be sure you do not have secondary hypertension (see "causes" above).   Your caregiver may also look for signs of metabolic syndrome. This is also called Syndrome X or Insulin Resistance Syndrome. You may have this syndrome if you have type 2 diabetes, abdominal obesity, and abnormal blood lipids in addition to hypertension.   Your caregiver will take your medical and family history and perform a physical exam.   Diagnostic tests may include blood tests (for glucose, cholesterol, potassium, and kidney function), a urinalysis, or an EKG. Other tests may also be necessary depending on your condition.  PREVENTION  There are important lifestyle issues that you can adopt to reduce your chance of developing hypertension:  Maintain a normal weight.   Limit the amount of salt (sodium) in your diet.   Exercise often.   Limit alcohol intake.   Get enough potassium in your diet. Discuss specific advice with your caregiver.   Follow a DASH diet (dietary approaches to stop hypertension). This diet is rich in fruits, vegetables, and low-fat dairy products, and avoids certain fats.  PROGNOSIS  Essential hypertension cannot be cured. Lifestyle changes and medical treatment can lower blood pressure and reduce complications. The prognosis of secondary hypertension depends on the underlying cause. Many people whose hypertension is controlled with medicine or lifestyle changes can live a normal, healthy life.  RISKS AND COMPLICATIONS  While high blood pressure alone is not an illness, it often requires treatment due to its short- and long-term effects on many organs. Hypertension increases your risk for:  CVAs or strokes (cerebrovascular accident).   Heart failure due to chronically high blood pressure (hypertensive cardiomyopathy).   Heart attack  (myocardial infarction).   Damage to the retina (hypertensive retinopathy).   Kidney failure (hypertensive nephropathy).  Your caregiver can explain list items above that apply to you. Treatment of hypertension can significantly reduce the risk of complications. TREATMENT   For overweight patients, weight loss and regular exercise are recommended. Physical fitness lowers blood pressure.   Mild hypertension is usually treated with diet and exercise. A diet rich in fruits and vegetables, fat-free dairy products, and foods low in fat and salt (sodium) can help lower blood pressure. Decreasing salt intake decreases blood pressure in a 1/3 of people.   Stop smoking if you are a smoker.  The steps above are highly effective in reducing blood pressure. While these actions are easy to suggest, they are difficult to achieve. Most patients with moderate or severe hypertension end up requiring medications to bring their blood pressure down to a normal level. There are several classes of medications for treatment. Blood pressure pills (antihypertensives) will lower blood pressure by their different actions. Lowering the blood pressure by 10 mmHg may decrease the risk of complications by as much as 25%. The goal of treatment is effective blood pressure control. This will reduce your risk for complications. Your caregiver will help you determine the best treatment for you according to your lifestyle. What is excellent treatment for one person, may not be for you. HOME CARE INSTRUCTIONS  Do not smoke.   Follow the lifestyle changes outlined in the "Prevention" section.   If you are on medications, follow the directions carefully. Blood pressure medications must be taken as prescribed. Skipping doses reduces their benefit. It also puts you at risk for problems.   Follow up with your caregiver, as directed.   If you are asked to monitor your blood pressure at home, follow the guidelines in the "Diagnosis"  section above.  SEEK MEDICAL CARE IF:   You think you are having medication side effects.   You have recurrent headaches or lightheadedness.   You have swelling in your ankles.   You have trouble with your vision.  SEEK IMMEDIATE MEDICAL CARE IF:   You have sudden onset of chest pain or pressure, difficulty breathing, or other symptoms of a heart attack.   You have a severe headache.   You have symptoms of a stroke (such as sudden weakness, difficulty speaking, difficulty walking).  MAKE SURE YOU:   Understand these instructions.   Will watch your condition.   Will get help right away if you are not doing well or get worse.  Document Released: 11/19/2005 Document Revised: 08/01/2011 Document Reviewed: 06/19/2007 [facility] Patient Information 2012 [place], [place].Dizziness Dizziness is a common problem. It is a feeling of unsteadiness or lightheadedness. You may feel like you are about to faint. Dizziness can lead to injury if you stumble or fall. A person of any age group can suffer from dizziness, but dizziness is more common in older adults. CAUSES  Dizziness can be caused by many different things, including:  Middle ear problems.   Standing for too long.   Infections.   An allergic reaction.   Aging.   An emotional response to something, such as the sight of blood.   Side effects of medicines.   Fatigue.   Problems with circulation or blood pressure.   Excess use of alcohol, medicines, or illegal drug use.   Breathing too fast (hyperventilation).   An arrhythmia or problems with your heart rhythm.   Anemia or a low blood count.   Pregnancy.   Vomiting, diarrhea, fever, or other illnesses that cause dehydration.   Diseases or conditions such as Parkinson's disease, high blood pressure (hypertension), diabetes, and thyroid problems.   Exposure to extreme heat.  DIAGNOSIS  To find the cause of your dizziness, your caregiver may do a physical exam, lab  tests, radiologic imaging scans, or an electrocardiography test (ECG).  TREATMENT  Treatment of dizziness depends on the cause of your symptoms and can vary greatly. HOME CARE INSTRUCTIONS   Drink enough fluids to keep your urine clear or pale yellow. This is especially important in very hot weather. In the elderly, it is also important in cold weather.   If your dizziness is caused by medicines, take them exactly as directed. When taking blood pressure medicines, it is especially important to get up slowly.   Rise slowly from chairs and steady yourself until you feel okay.   In the morning, first sit up on the side of the bed. When this seems okay, stand slowly while holding onto something until you know your balance is fine.   If you need to stand in one place for a long time, be sure to move your legs often. Tighten and relax the muscles in your legs while standing.   If dizziness continues to be a problem, have someone stay with you for a day or two. Do this until you  feel you are well enough to stay alone. Have the person call your caregiver if he or she notices changes in you that are concerning.   Do not drive or use heavy machinery if you feel dizzy.  SEEK IMMEDIATE MEDICAL CARE IF:   Your dizziness or lightheadedness gets worse.   You feel nauseous or vomit.   You develop problems with talking, walking, weakness, or using your arms, hands, or legs.   You are not thinking clearly or you have difficulty forming sentences. It may take a friend or family member to determine if your thinking is normal.   You develop chest pain, abdominal pain, shortness of breath, or sweating.   Your vision changes.   You notice any bleeding.   You have side effects from medicine that seems to be getting worse rather than better.  MAKE SURE YOU:   Understand these instructions.   Will watch your condition.   Will get help right away if you are not doing well or get worse.  Document  Released: 05/15/2001 Document Revised: 07/24/2011 Document Reviewed: 06/08/2011 [facility] Patient Information 2012 [place], [place].

## 2012-01-16 NOTE — ED Provider Notes (Signed)
History     CSN: [id number]  Arrival date & time 01/16/12  1253   First MD Initiated Contact with Patient 01/16/12 1359      Chief Complaint  Patient presents with  . Hypertension  . Dizziness    (Consider location/radiation/quality/duration/timing/severity/associated sxs/prior treatment) Patient is a 78 y.o. female presenting with hypertension. The history is provided by the patient.  Hypertension This is a new problem. Associated symptoms include abdominal pain and headaches. Pertinent negatives include no chest pain and no shortness of breath.   patient states she was at home and felt dizzy. She states she checked her blood pressure was elevated. He was 180/100. She states she feels like this her blood pressure is high. She is on medicines to keep her heart rate slowed, but not necessarily for blood pressure. She states she's currently on antibiotics for a urinary tract infection. She is halfway through a 14 day course. She states she does get abdominal pain and flank pain. These pains have been improving somewhat. No numbness or weakness. Chest pain. She does have a dull headache.  Past Medical History  Diagnosis Date  . Hypertension   . Arthritis     Past Surgical History  Procedure Date  . Cholecystectomy     No family history on file.  History  Substance Use Topics  . Smoking status: Never Smoker   . Smokeless tobacco: Not on file  . Alcohol Use: No    OB History    Grav Para Term Preterm Abortions TAB SAB Ect Mult Living                  Review of Systems  Constitutional: Negative for activity change and appetite change.  HENT: Negative for neck stiffness.   Eyes: Negative for pain.  Respiratory: Negative for chest tightness and shortness of breath.   Cardiovascular: Negative for chest pain and leg swelling.  Gastrointestinal: Positive for abdominal pain. Negative for nausea, vomiting and diarrhea.  Genitourinary: Positive for flank pain. Negative for  dysuria.  Musculoskeletal: Negative for back pain.  Skin: Negative for rash.  Neurological: Positive for dizziness and headaches. Negative for weakness and numbness.  Psychiatric/Behavioral: Negative for behavioral problems.    Allergies  Codeine and Hydrocodone  Home Medications   Current Outpatient Rx  Name Route Sig Dispense Refill  . ASPIRIN 81 MG PO CHEW Oral Chew 81 mg by mouth daily.    [name] CALCIUM CARBONATE-VITAMIN D 600-200 MG-UNIT PO TABS Oral Take 1 tablet by mouth daily.    [name] LATANOPROST 0.005 % OP SOLN Both Eyes Place 1 drop into both eyes at bedtime.    [name] MECLIZINE HCL 25 MG PO TABS Oral Take 25 mg by mouth 3 (three) times daily as needed. For dizziness    . METOPROLOL TARTRATE 25 MG PO TABS Oral Take 25 mg by mouth 2 (two) times daily.    [name] NAPROXEN 500 MG PO TABS Oral Take 500 mg by mouth 2 (two) times daily as needed. For pain    . NITROFURANTOIN MACROCRYSTAL 100 MG PO CAPS Oral Take 100 mg by mouth 2 (two) times daily.     [name] RANITIDINE HCL 150 MG PO TABS Oral Take 150 mg by mouth 2 (two) times daily.    [name] SIMVASTATIN 10 MG PO TABS Oral Take 10 mg by mouth at bedtime.    . TRAMADOL HCL 50 MG PO TABS Oral Take 50-100 mg by mouth every 6 (six) hours as needed. For pain    .  ZOLPIDEM TARTRATE 10 MG PO TABS Oral Take 10 mg by mouth at bedtime as needed. For sleep      BP 130/72  Pulse 58  Temp(Src) 97.9 F (36.6 C) (Oral)  Resp 16  Ht 5\' 9"  (1.753 m)  Wt 185 lb (83.915 kg)  BMI 27.32 kg/m2  SpO2 96%  Physical Exam  Nursing note and vitals reviewed. Constitutional: She is oriented to person, place, and time. She appears well-developed and well-nourished.  HENT:  Head: Normocephalic and atraumatic.  Eyes: EOM are normal. Pupils are equal, round, and reactive to light.  Neck: Normal range of motion. Neck supple.  Cardiovascular: Normal rate, regular rhythm and normal heart sounds.   No murmur heard. Pulmonary/Chest: Effort normal and breath sounds normal. No  respiratory distress. She has no wheezes. She has no rales.  Abdominal: Soft. Bowel sounds are normal. She exhibits no distension. There is no tenderness. There is no rebound and no guarding.  Musculoskeletal: Normal range of motion.  Neurological: She is alert and oriented to person, place, and time. No cranial nerve deficit.  Skin: Skin is warm and dry.  Psychiatric: She has a normal mood and affect. Her speech is normal.    ED Course  Procedures (including critical care time)  Labs Reviewed  CBC - Abnormal; Notable for the following:    RBC 3.80 (*)    HCT 35.8 (*)    All other components within normal limits  BASIC METABOLIC PANEL - Abnormal; Notable for the following:    Glucose, Bld 106 (*)    GFR calc non Af Amer 84 (*)    All other components within normal limits  URINALYSIS, ROUTINE W REFLEX MICROSCOPIC - Abnormal; Notable for the following:    Leukocytes, UA TRACE (*)    All other components within normal limits  URINE MICROSCOPIC-ADD ON - Abnormal; Notable for the following:    Squamous Epithelial / LPF FEW (*)    Bacteria, UA FEW (*)    All other components within normal limits  DIFFERENTIAL  TROPONIN I  URINE CULTURE   Dg Chest 2 View  01/16/2012  *RADIOLOGY REPORT*  Clinical Data: Dizziness, hypertension.  CHEST - 2 VIEW  Comparison: 11/26/2007.  10/01/2006.  Findings: Heart is upper limits normal in size.  Lungs are clear. No effusions.  No acute bony abnormality.  No change.  IMPRESSION: No active disease.  Original Report Authenticated By: Cyndie Chime, M.D.     1. Hypertension   2. Dizziness      Date: 21  Rhythm: sinus bradycardia  QRS Axis: normal  Intervals: normal  ST/T Wave abnormalities: nonspecific ST/T changes  Conduction Disutrbances:none  Narrative Interpretation:   Old EKG Reviewed: none available    MDM  Hypertension. Resolved on its own. She was dizzy during the event. This improved since his come down. Lab work is reassuring.  Enzymes are negative. EKG had some nonspecific changes. Urine showed a few white cells a few bacteria. She started on antibiotics. Culture was sent which will be followed as needed.        Juliet Rude. Rubin Payor, MD 01/16/12 1615

## 2012-01-16 NOTE — ED Notes (Signed)
Pt started experiencing dizziness, headache this morning while walking to mailbox.  Reports checked her bp at home and it was high.  Pt reports taking med for kidney infection and has had congestion x 3 days.  Speech clear at this time.

## 2012-01-17 LAB — URINE CULTURE
Colony Count: NO GROWTH
Culture  Setup Time: 201302140157
Culture: NO GROWTH

## 2012-03-04 ENCOUNTER — Other Ambulatory Visit: Payer: Self-pay | Admitting: Neurology

## 2012-03-04 DIAGNOSIS — G459 Transient cerebral ischemic attack, unspecified: Secondary | ICD-10-CM

## 2012-03-13 ENCOUNTER — Ambulatory Visit
Admission: RE | Admit: 2012-03-13 | Discharge: 2012-03-13 | Disposition: A | Discharge status: Home / Self Care | Payer: Medicare Other | Source: Ambulatory Visit | Attending: Neurology | Admitting: Neurology

## 2012-03-13 DIAGNOSIS — G459 Transient cerebral ischemic attack, unspecified: Secondary | ICD-10-CM

## 2012-03-13 MED ORDER — GADOBENATE DIMEGLUMINE 529 MG/ML IV SOLN
17.0000 mL | Freq: Once | INTRAVENOUS | Status: AC | PRN
  Administered 2012-03-13: 17 mL via INTRAVENOUS

## 2012-03-27 ENCOUNTER — Ambulatory Visit (HOSPITAL_COMMUNITY)
Admission: RE | Admit: 2012-03-27 | Discharge: 2012-03-27 | Disposition: A | Discharge status: Home / Self Care | Payer: Medicare Other | Source: Ambulatory Visit | Attending: Family Medicine | Admitting: Family Medicine

## 2012-03-27 ENCOUNTER — Other Ambulatory Visit (HOSPITAL_COMMUNITY): Payer: Self-pay | Admitting: Family Medicine

## 2012-03-27 DIAGNOSIS — N200 Calculus of kidney: Secondary | ICD-10-CM | POA: Insufficient documentation

## 2012-03-27 DIAGNOSIS — R109 Unspecified abdominal pain: Secondary | ICD-10-CM

## 2012-03-27 DIAGNOSIS — R911 Solitary pulmonary nodule: Secondary | ICD-10-CM | POA: Insufficient documentation

## 2012-03-27 DIAGNOSIS — R1031 Right lower quadrant pain: Secondary | ICD-10-CM | POA: Insufficient documentation

## 2012-04-21 ENCOUNTER — Other Ambulatory Visit: Payer: Self-pay | Admitting: Family Medicine

## 2012-04-21 DIAGNOSIS — Z1231 Encounter for screening mammogram for malignant neoplasm of breast: Secondary | ICD-10-CM

## 2012-06-03 ENCOUNTER — Ambulatory Visit
Admission: RE | Admit: 2012-06-03 | Discharge: 2012-06-03 | Disposition: A | Discharge status: Home / Self Care | Payer: Medicare Other | Source: Ambulatory Visit | Attending: Family Medicine | Admitting: Family Medicine

## 2012-06-03 DIAGNOSIS — Z1231 Encounter for screening mammogram for malignant neoplasm of breast: Secondary | ICD-10-CM

## 2012-06-12 ENCOUNTER — Encounter (INDEPENDENT_AMBULATORY_CARE_PROVIDER_SITE_OTHER): Payer: Medicare Other

## 2012-06-12 ENCOUNTER — Other Ambulatory Visit: Payer: Self-pay | Admitting: *Deleted

## 2012-06-12 DIAGNOSIS — M7989 Other specified soft tissue disorders: Secondary | ICD-10-CM

## 2012-06-12 DIAGNOSIS — M79609 Pain in unspecified limb: Secondary | ICD-10-CM

## 2012-09-18 ENCOUNTER — Other Ambulatory Visit: Payer: Self-pay | Admitting: Physical Medicine and Rehabilitation

## 2012-09-18 DIAGNOSIS — M545 Low back pain: Secondary | ICD-10-CM

## 2012-09-18 DIAGNOSIS — M79605 Pain in left leg: Secondary | ICD-10-CM

## 2012-09-24 ENCOUNTER — Other Ambulatory Visit: Payer: Medicare Other

## 2012-09-25 ENCOUNTER — Ambulatory Visit
Admission: RE | Admit: 2012-09-25 | Discharge: 2012-09-25 | Disposition: A | Discharge status: Home / Self Care | Payer: Medicare Other | Source: Ambulatory Visit | Attending: Physical Medicine and Rehabilitation | Admitting: Physical Medicine and Rehabilitation

## 2012-09-25 DIAGNOSIS — M545 Low back pain: Secondary | ICD-10-CM

## 2012-09-25 DIAGNOSIS — M79605 Pain in left leg: Secondary | ICD-10-CM

## 2012-09-29 ENCOUNTER — Other Ambulatory Visit (HOSPITAL_COMMUNITY): Payer: Self-pay | Admitting: Internal Medicine

## 2012-09-29 DIAGNOSIS — M810 Age-related osteoporosis without current pathological fracture: Secondary | ICD-10-CM

## 2012-09-30 ENCOUNTER — Ambulatory Visit (HOSPITAL_COMMUNITY)
Admission: RE | Admit: 2012-09-30 | Discharge: 2012-09-30 | Disposition: A | Discharge status: Home / Self Care | Payer: Medicare Other | Source: Ambulatory Visit | Attending: Internal Medicine | Admitting: Internal Medicine

## 2012-09-30 DIAGNOSIS — M810 Age-related osteoporosis without current pathological fracture: Secondary | ICD-10-CM

## 2012-09-30 DIAGNOSIS — Z780 Asymptomatic menopausal state: Secondary | ICD-10-CM | POA: Insufficient documentation

## 2012-09-30 DIAGNOSIS — M818 Other osteoporosis without current pathological fracture: Secondary | ICD-10-CM | POA: Insufficient documentation

## 2012-10-01 ENCOUNTER — Other Ambulatory Visit (HOSPITAL_COMMUNITY): Payer: Medicare Other

## 2013-04-28 ENCOUNTER — Other Ambulatory Visit: Payer: Self-pay

## 2013-04-28 DIAGNOSIS — Z1231 Encounter for screening mammogram for malignant neoplasm of breast: Secondary | ICD-10-CM

## 2013-05-22 ENCOUNTER — Emergency Department (HOSPITAL_COMMUNITY): Payer: Medicare Other

## 2013-05-22 ENCOUNTER — Emergency Department (HOSPITAL_COMMUNITY)
Admission: EM | Admit: 2013-05-22 | Discharge: 2013-05-22 | Disposition: A | Discharge status: Home / Self Care | Payer: Medicare Other | Attending: Emergency Medicine | Admitting: Emergency Medicine

## 2013-05-22 ENCOUNTER — Encounter (HOSPITAL_COMMUNITY): Payer: Self-pay | Admitting: *Deleted

## 2013-05-22 DIAGNOSIS — M549 Dorsalgia, unspecified: Secondary | ICD-10-CM | POA: Insufficient documentation

## 2013-05-22 DIAGNOSIS — Z9089 Acquired absence of other organs: Secondary | ICD-10-CM | POA: Insufficient documentation

## 2013-05-22 DIAGNOSIS — R109 Unspecified abdominal pain: Secondary | ICD-10-CM | POA: Insufficient documentation

## 2013-05-22 DIAGNOSIS — IMO0001 Reserved for inherently not codable concepts without codable children: Secondary | ICD-10-CM | POA: Insufficient documentation

## 2013-05-22 DIAGNOSIS — F3289 Other specified depressive episodes: Secondary | ICD-10-CM | POA: Insufficient documentation

## 2013-05-22 DIAGNOSIS — J029 Acute pharyngitis, unspecified: Secondary | ICD-10-CM | POA: Insufficient documentation

## 2013-05-22 DIAGNOSIS — R079 Chest pain, unspecified: Secondary | ICD-10-CM | POA: Insufficient documentation

## 2013-05-22 DIAGNOSIS — Z79899 Other long term (current) drug therapy: Secondary | ICD-10-CM | POA: Insufficient documentation

## 2013-05-22 DIAGNOSIS — H538 Other visual disturbances: Secondary | ICD-10-CM | POA: Insufficient documentation

## 2013-05-22 DIAGNOSIS — J3489 Other specified disorders of nose and nasal sinuses: Secondary | ICD-10-CM | POA: Insufficient documentation

## 2013-05-22 DIAGNOSIS — F329 Major depressive disorder, single episode, unspecified: Secondary | ICD-10-CM | POA: Insufficient documentation

## 2013-05-22 DIAGNOSIS — I10 Essential (primary) hypertension: Secondary | ICD-10-CM | POA: Insufficient documentation

## 2013-05-22 DIAGNOSIS — Z7982 Long term (current) use of aspirin: Secondary | ICD-10-CM | POA: Insufficient documentation

## 2013-05-22 DIAGNOSIS — R112 Nausea with vomiting, unspecified: Secondary | ICD-10-CM | POA: Insufficient documentation

## 2013-05-22 DIAGNOSIS — M129 Arthropathy, unspecified: Secondary | ICD-10-CM | POA: Insufficient documentation

## 2013-05-22 DIAGNOSIS — K5289 Other specified noninfective gastroenteritis and colitis: Secondary | ICD-10-CM | POA: Insufficient documentation

## 2013-05-22 DIAGNOSIS — K529 Noninfective gastroenteritis and colitis, unspecified: Secondary | ICD-10-CM

## 2013-05-22 HISTORY — DX: Major depressive disorder, single episode, unspecified: F32.9

## 2013-05-22 HISTORY — DX: Depression, unspecified: F32.A

## 2013-05-22 LAB — URINALYSIS, ROUTINE W REFLEX MICROSCOPIC
Glucose, UA: NEGATIVE mg/dL
Hgb urine dipstick: NEGATIVE
Ketones, ur: NEGATIVE mg/dL
Protein, ur: NEGATIVE mg/dL

## 2013-05-22 LAB — CBC WITH DIFFERENTIAL/PLATELET
Basophils Relative: 0 % (ref 0–1)
HCT: 38.3 % (ref 36.0–46.0)
Hemoglobin: 13 g/dL (ref 12.0–15.0)
Lymphocytes Relative: 17 % (ref 12–46)
MCHC: 33.9 g/dL (ref 30.0–36.0)
MCV: 86.8 fL (ref 78.0–100.0)
Monocytes Absolute: 0.6 10*3/uL (ref 0.1–1.0)
Monocytes Relative: 8 % (ref 3–12)
Neutro Abs: 5.4 10*3/uL (ref 1.7–7.7)

## 2013-05-22 LAB — BASIC METABOLIC PANEL
BUN: 20 mg/dL (ref 6–23)
CO2: 26 mEq/L (ref 19–32)
Chloride: 104 mEq/L (ref 96–112)
Creatinine, Ser: 0.91 mg/dL (ref 0.50–1.10)

## 2013-05-22 LAB — HEPATIC FUNCTION PANEL
ALT: 20 U/L (ref 0–35)
AST: 22 U/L (ref 0–37)
Bilirubin, Direct: 0.1 mg/dL (ref 0.0–0.3)
Total Bilirubin: 0.5 mg/dL (ref 0.3–1.2)

## 2013-05-22 MED ORDER — IOHEXOL 300 MG/ML  SOLN
100.0000 mL | Freq: Once | INTRAMUSCULAR | Status: AC | PRN
  Administered 2013-05-22: 100 mL via INTRAVENOUS

## 2013-05-22 MED ORDER — SODIUM CHLORIDE 0.9 % IV BOLUS (SEPSIS): 250.0000 mL | Freq: Once | INTRAVENOUS | Status: DC

## 2013-05-22 MED ORDER — SODIUM CHLORIDE 0.9 % IV SOLN: INTRAVENOUS | Status: DC

## 2013-05-22 MED ORDER — ONDANSETRON HCL 4 MG/2ML IJ SOLN
INTRAMUSCULAR | Status: AC
  Filled 2013-05-22: qty 2

## 2013-05-22 MED ORDER — SODIUM CHLORIDE 0.9 % IV SOLN
INTRAVENOUS | Status: DC
  Administered 2013-05-22: 09:00:00 via INTRAVENOUS

## 2013-05-22 MED ORDER — IOHEXOL 300 MG/ML  SOLN: 50.0000 mL | Freq: Once | INTRAMUSCULAR | Status: DC | PRN

## 2013-05-22 MED ORDER — IOHEXOL 300 MG/ML  SOLN
50.0000 mL | Freq: Once | INTRAMUSCULAR | Status: AC | PRN
  Administered 2013-05-22: 50 mL via ORAL

## 2013-05-22 MED ORDER — ONDANSETRON HCL 4 MG/2ML IJ SOLN: 4.0000 mg | Freq: Once | INTRAMUSCULAR | Status: DC

## 2013-05-22 MED ORDER — ONDANSETRON 8 MG PO TBDP: 8.0000 mg | ORAL_TABLET | Freq: Three times a day (TID) | ORAL | Status: DC | PRN

## 2013-05-22 NOTE — ED Notes (Signed)
Pt in CT.

## 2013-05-22 NOTE — ED Provider Notes (Signed)
History    This chart was scribed for Shelda Jakes, MD by Leone Payor, ED Scribe. This patient was seen in room [facility] and the patient's care was started 9:11 AM.   CSN: [id number]  Arrival date & time 05/22/13  0858   None     Chief Complaint  Patient presents with  . Emesis  . Diarrhea     The history is provided by the patient. No language interpreter was used.    HPI Comments: Cassandra [name] is a 79 y.o. female who presents to the Emergency Department complaining of ongoing, unchanged emesis and diarrhea that started about 1 week ago. She has associated abdominal pain. States 11 other family members have been sick but her symptoms are lasting longer than theirs. States she had black and tarry diarrhea 3-4 days ago. She rates the pain as 8-9/10 prior to her episode of emesis currently. She had several episodes of emesis and diarrhea when symptoms started but episodes have decreased because she has not been eating or drinking well.     Past Medical History  Diagnosis Date  . Hypertension   . Arthritis   . Depression     Past Surgical History  Procedure Laterality Date  . Cholecystectomy      No family history on file.  History  Substance Use Topics  . Smoking status: Never Smoker   . Smokeless tobacco: Not on file  . Alcohol Use: No    OB History   Grav Para Term Preterm Abortions TAB SAB Ect Mult Living                  Review of Systems  Constitutional: Negative for fever.  HENT: Positive for sore throat and rhinorrhea.   Eyes: Positive for visual disturbance.  Respiratory: Negative for cough and shortness of breath.   Cardiovascular: Positive for chest pain. Negative for leg swelling.  Gastrointestinal: Positive for nausea, vomiting, abdominal pain and diarrhea. Negative for blood in stool.  Genitourinary: Negative for dysuria.  Musculoskeletal: Positive for myalgias and back pain. Negative for joint swelling.  Skin: Negative for rash.   Neurological: Negative for headaches.  Hematological: Does not bruise/bleed easily.  Psychiatric/Behavioral: Negative for confusion.    Allergies  Codeine and Hydrocodone  Home Medications   Current Outpatient Rx  Name  Route  Sig  Dispense  Refill  . aspirin 81 MG chewable tablet   Oral   Chew 81 mg by mouth daily.         . Calcium Carbonate-Vitamin D (CALCIUM + D) 600-200 MG-UNIT TABS   Oral   Take 1 tablet by mouth daily.         [name] latanoprost (XALATAN) 0.005 % ophthalmic solution   Both Eyes   Place 1 drop into both eyes at bedtime.         . naproxen (NAPROSYN) 500 MG tablet   Oral   Take 500 mg by mouth 2 (two) times daily as needed. For pain         . ranitidine (ZANTAC) 150 MG tablet   Oral   Take 150 mg by mouth 2 (two) times daily.         . simvastatin (ZOCOR) 10 MG tablet   Oral   Take 10 mg by mouth at bedtime.         . traMADol (ULTRAM) 50 MG tablet   Oral   Take 50-100 mg by mouth every 6 (six) hours as needed. For  pain         . ondansetron (ZOFRAN ODT) 8 MG disintegrating tablet   Oral   Take 1 tablet (8 mg total) by mouth every 8 (eight) hours as needed for nausea.   20 tablet   0     BP 174/73  Pulse 57  Temp(Src) 98.3 F (36.8 C) (Oral)  Resp 16  Ht 5\' 8"  (1.727 m)  Wt 169 lb (76.658 kg)  BMI 25.7 kg/m2  SpO2 99%  Physical Exam  Nursing note and vitals reviewed. Constitutional: She is oriented to person, place, and time. She appears well-developed and well-nourished. No distress.  HENT:  Head: Normocephalic and atraumatic.  Mouth/Throat: Uvula is midline, oropharynx is clear and moist and mucous membranes are normal.  Eyes: EOM are normal.  Neck: Neck supple. No tracheal deviation present.  Cardiovascular: Normal rate, regular rhythm and normal heart sounds.   No murmur heard. Pulmonary/Chest: Effort normal and breath sounds normal. No respiratory distress. She has no wheezes. She has no rales.  Abdominal:  Soft. Bowel sounds are decreased. There is no tenderness.  Musculoskeletal: Normal range of motion.  Neurological: She is alert and oriented to person, place, and time.  Skin: Skin is warm and dry.  Psychiatric: She has a normal mood and affect. Her behavior is normal.    ED Course  Procedures (including critical care time)  DIAGNOSTIC STUDIES: Oxygen Saturation is 96% on RA, adequate by my interpretation.    COORDINATION OF CARE: 9:32 AM Discussed treatment plan with pt at bedside and pt agreed to plan.   Results for orders placed during the hospital encounter of 05/22/13  CBC WITH DIFFERENTIAL      Result Value Range   WBC 7.4  4.0 - 10.5 K/uL   RBC 4.41  3.87 - 5.11 MIL/uL   Hemoglobin 13.0  12.0 - 15.0 g/dL   HCT 84.6  96.2 - 95.2 %   MCV 86.8  78.0 - 100.0 fL   MCH 29.5  26.0 - 34.0 pg   MCHC 33.9  30.0 - 36.0 g/dL   RDW 84.1  32.4 - 40.1 %   Platelets 248  150 - 400 K/uL   Neutrophils Relative % 72  43 - 77 %   Neutro Abs 5.4  1.7 - 7.7 K/uL   Lymphocytes Relative 17  12 - 46 %   Lymphs Abs 1.3  0.7 - 4.0 K/uL   Monocytes Relative 8  3 - 12 %   Monocytes Absolute 0.6  0.1 - 1.0 K/uL   Eosinophils Relative 2  0 - 5 %   Eosinophils Absolute 0.2  0.0 - 0.7 K/uL   Basophils Relative 0  0 - 1 %   Basophils Absolute 0.0  0.0 - 0.1 K/uL  BASIC METABOLIC PANEL      Result Value Range   Sodium 140  135 - 145 mEq/L   Potassium 3.7  3.5 - 5.1 mEq/L   Chloride 104  96 - 112 mEq/L   CO2 26  19 - 32 mEq/L   Glucose, Bld 121 (*) 70 - 99 mg/dL   BUN 20  6 - 23 mg/dL   Creatinine, Ser 0.27  0.50 - 1.10 mg/dL   Calcium 9.4  8.4 - 25.3 mg/dL   GFR calc non Af Amer 59 (*) >90 mL/min   GFR calc Af Amer 69 (*) >90 mL/min  LIPASE, BLOOD      Result Value Range   Lipase 36  11 -  59 U/L  HEPATIC FUNCTION PANEL      Result Value Range   Total Protein 6.6  6.0 - 8.3 g/dL   Albumin 3.7  3.5 - 5.2 g/dL   AST 22  0 - 37 U/L   ALT 20  0 - 35 U/L   Alkaline Phosphatase 67  39 - 117  U/L   Total Bilirubin 0.5  0.3 - 1.2 mg/dL   Bilirubin, Direct 0.1  0.0 - 0.3 mg/dL   Indirect Bilirubin 0.4  0.3 - 0.9 mg/dL  TROPONIN I      Result Value Range   Troponin I <0.30  <0.30 ng/mL  URINALYSIS, ROUTINE W REFLEX MICROSCOPIC      Result Value Range   Color, Urine YELLOW  YELLOW   APPearance CLEAR  CLEAR   Specific Gravity, Urine <1.005 (*) 1.005 - 1.030   pH 5.5  5.0 - 8.0   Glucose, UA NEGATIVE  NEGATIVE mg/dL   Hgb urine dipstick NEGATIVE  NEGATIVE   Bilirubin Urine NEGATIVE  NEGATIVE   Ketones, ur NEGATIVE  NEGATIVE mg/dL   Protein, ur NEGATIVE  NEGATIVE mg/dL   Urobilinogen, UA 0.2  0.0 - 1.0 mg/dL   Nitrite NEGATIVE  NEGATIVE   Leukocytes, UA NEGATIVE  NEGATIVE   Dg Chest 2 View  05/22/2013   *RADIOLOGY REPORT*  Clinical Data: Diarrhea, emesis, hypertension  CHEST - 2 VIEW  Comparison: 01/16/2012  Findings:  Grossly unchanged cardiac silhouette and mediastinal contours.  The lungs appear hyperexpanded. There is mild eventration of the anterior medial aspect the right hemidiaphragm.  No focal airspace opacity.  No pleural effusion or pneumothorax.  No evidence of edema.  Unchanged bones.  Post cholecystectomy.  IMPRESSION:  Mild lung hyperexpansion without acute cardiopulmonary disease.   Original Report Authenticated By: Tacey Ruiz, MD   Ct Abdomen Pelvis W Contrast  05/22/2013   *RADIOLOGY REPORT*  Clinical Data: Right side abdominal pain, diarrhea  CT ABDOMEN AND PELVIS WITH CONTRAST  Technique:  Multidetector CT imaging of the abdomen and pelvis was performed following the standard protocol during bolus administration of intravenous contrast.  Contrast: 50mL OMNIPAQUE IOHEXOL 300 MG/ML  SOLN, OMNIPAQUE IOHEXOL 300 MG/ML  SOLN  Comparison: 03/27/2012  Findings: Two tiny nodules right base laterally again noted the largest measures 3 mm stable in size and appearance from prior exam.  Sagittal images of the spine shows again degenerative changes lumbar spine.  Liver,  spleen, pancreas and adrenals are unremarkable.  The patient is status post cholecystectomy.  Kidneys are symmetrical in size and enhancement.  Bilateral lobulated renal contour.  No hydronephrosis or hydroureter. Delayed renal images shows bilateral renal symmetrical excretion. Bilateral visualized proximal ureter is unremarkable.  No aortic aneurysm.  No small bowel obstruction.  No ascites or free air.  No adenopathy.  No pericecal inflammation.  Limited assessment of the colon which is not opacified with contrast.  The terminal ileum is unremarkable.  Uterus and adnexa are unremarkable.  Bilateral distal ureter is unremarkable.  No pelvic ascites or adenopathy.  Small bilateral inguinal hernia containing fat without evidence of acute complication.  IMPRESSION:  1.  No acute inflammatory process within abdomen or pelvis. 2.  Status post cholecystectomy. 3.  No pericecal inflammation. 4.  No small bowel obstruction.   Original Report Authenticated By: Natasha Mead, M.D.   Medications  sodium chloride 0.9 % bolus 250 mL (0 mLs Intravenous Stopped 05/22/13 1027)  ondansetron (ZOFRAN) injection 4 mg (not administered)  0.9 %  sodium chloride infusion ( Intravenous Stopped 05/22/13 1130)  iohexol (OMNIPAQUE) 300 MG/ML solution 50 mL (50 mLs Oral Contrast Given 05/22/13 1102)  iohexol (OMNIPAQUE) 300 MG/ML solution 100 mL (100 mLs Intravenous Contrast Given 05/22/13 1102)     Dg Chest 2 View  05/22/2013   *RADIOLOGY REPORT*  Clinical Data: Diarrhea, emesis, hypertension  CHEST - 2 VIEW  Comparison: 01/16/2012  Findings:  Grossly unchanged cardiac silhouette and mediastinal contours.  The lungs appear hyperexpanded. There is mild eventration of the anterior medial aspect the right hemidiaphragm.  No focal airspace opacity.  No pleural effusion or pneumothorax.  No evidence of edema.  Unchanged bones.  Post cholecystectomy.  IMPRESSION:  Mild lung hyperexpansion without acute cardiopulmonary disease.   Original  Report Authenticated By: Tacey Ruiz, MD   Ct Abdomen Pelvis W Contrast  05/22/2013   *RADIOLOGY REPORT*  Clinical Data: Right side abdominal pain, diarrhea  CT ABDOMEN AND PELVIS WITH CONTRAST  Technique:  Multidetector CT imaging of the abdomen and pelvis was performed following the standard protocol during bolus administration of intravenous contrast.  Contrast: 50mL OMNIPAQUE IOHEXOL 300 MG/ML  SOLN, OMNIPAQUE IOHEXOL 300 MG/ML  SOLN  Comparison: 03/27/2012  Findings: Two tiny nodules right base laterally again noted the largest measures 3 mm stable in size and appearance from prior exam.  Sagittal images of the spine shows again degenerative changes lumbar spine.  Liver, spleen, pancreas and adrenals are unremarkable.  The patient is status post cholecystectomy.  Kidneys are symmetrical in size and enhancement.  Bilateral lobulated renal contour.  No hydronephrosis or hydroureter. Delayed renal images shows bilateral renal symmetrical excretion. Bilateral visualized proximal ureter is unremarkable.  No aortic aneurysm.  No small bowel obstruction.  No ascites or free air.  No adenopathy.  No pericecal inflammation.  Limited assessment of the colon which is not opacified with contrast.  The terminal ileum is unremarkable.  Uterus and adnexa are unremarkable.  Bilateral distal ureter is unremarkable.  No pelvic ascites or adenopathy.  Small bilateral inguinal hernia containing fat without evidence of acute complication.  IMPRESSION:  1.  No acute inflammatory process within abdomen or pelvis. 2.  Status post cholecystectomy. 3.  No pericecal inflammation. 4.  No small bowel obstruction.   Original Report Authenticated By: Natasha Mead, M.D.     Date: 05/22/2013  Rate: 60  Rhythm: normal sinus rhythm  QRS Axis: normal  Intervals: normal  ST/T Wave abnormalities: nonspecific ST/T changes  Conduction Disutrbances:none  Narrative Interpretation:   Old EKG Reviewed: unchanged EKG is unchanged  compared to 01/16/2012   1. Gastroenteritis       MDM  Extensive workup without any significant findings. Suspect this may have just been a prolonged gastroenteritis-type illness. Patient improves significantly with fluid hydration but clinically no signs of dehydration no signs of dehydration by labs. CT scan of the abdomen without any acute intra-abdominal abnormalities. Patient improved significantly with Zofran we'll discharge home with Zofran she has tramadol already at home to take for the right-sided discomfort not able to specifically explain the right-sided pain. But no evidence of appendicitis.     I personally performed the services described in this documentation, which was scribed in my presence. The recorded information has been reviewed and is accurate.     Shelda Jakes, MD 05/22/13 1351

## 2013-05-22 NOTE — ED Notes (Signed)
Pt vomiting at this time 

## 2013-05-22 NOTE — ED Notes (Signed)
Pt finished contrast, CT aware. Nad.

## 2013-05-22 NOTE — ED Notes (Signed)
V/D since last Saturday. Pain to right abdomen also. Pt states 11 other family members have also been sick but hers sickness is lasting longer.

## 2013-05-22 NOTE — ED Notes (Signed)
Left in c/o family for transport home; instructions, prescriptions and f/u information given/reviewed - verbalizes understanding.  

## 2013-05-22 NOTE — ED Notes (Signed)
Attempted to obtain urine with nuns caps.  Urine missed nuns cap.  Pt states she will be able to go again in a few minutes.

## 2013-06-04 ENCOUNTER — Ambulatory Visit
Admission: RE | Admit: 2013-06-04 | Discharge: 2013-06-04 | Disposition: A | Discharge status: Home / Self Care | Payer: Medicare Other | Source: Ambulatory Visit

## 2013-06-04 DIAGNOSIS — Z1231 Encounter for screening mammogram for malignant neoplasm of breast: Secondary | ICD-10-CM

## 2013-11-03 ENCOUNTER — Encounter: Payer: Self-pay | Admitting: Cardiology

## 2013-11-03 DIAGNOSIS — Z9289 Personal history of other medical treatment: Secondary | ICD-10-CM

## 2013-11-03 DIAGNOSIS — R002 Palpitations: Secondary | ICD-10-CM | POA: Insufficient documentation

## 2013-11-03 DIAGNOSIS — I10 Essential (primary) hypertension: Secondary | ICD-10-CM | POA: Insufficient documentation

## 2013-11-03 DIAGNOSIS — Q211 Atrial septal defect: Secondary | ICD-10-CM | POA: Insufficient documentation

## 2013-11-03 DIAGNOSIS — Q2112 Patent foramen ovale: Secondary | ICD-10-CM | POA: Insufficient documentation

## 2013-11-03 DIAGNOSIS — E785 Hyperlipidemia, unspecified: Secondary | ICD-10-CM | POA: Insufficient documentation

## 2013-11-04 ENCOUNTER — Encounter: Payer: Self-pay | Admitting: Cardiovascular Disease

## 2013-11-04 ENCOUNTER — Ambulatory Visit (INDEPENDENT_AMBULATORY_CARE_PROVIDER_SITE_OTHER): Payer: Medicare Other | Admitting: Cardiovascular Disease

## 2013-11-04 VITALS — BP 140/80 | HR 58 | Ht 68.5 in | Wt 178.2 lb

## 2013-11-04 DIAGNOSIS — E785 Hyperlipidemia, unspecified: Secondary | ICD-10-CM

## 2013-11-04 DIAGNOSIS — I10 Essential (primary) hypertension: Secondary | ICD-10-CM

## 2013-11-04 NOTE — Assessment & Plan Note (Signed)
On statin therapy followed by her PCP 

## 2013-11-04 NOTE — Progress Notes (Signed)
11/04/2013 Cassandra [name]   03-29-1933  [id number]  Primary Physician Colette Ribas, MD Primary Cardiologist: Runell Gess MD [name]   HPI:  The patient is a 79 year old, mildly overweight, married Caucasian female, mother of 3, grandmother to 4 grandchildren who I last saw a year ago. Her risk factors for heart disease are positive for hyperlipidemia as well as hypertension. She was cath'd November 2003 revealing normal coronary arteries and normal left ventricular function. She does have a history of a small PFO which she is asymptomatic from. Her last echo performed in 2008 was normal. She is otherwise asymptomatic. Dr. Phillips Odor recently checked her lipid profile and told her that it was excellent. We'll obtain a copy of this.   Current Outpatient Prescriptions  Medication Sig Dispense Refill  . aspirin 81 MG chewable tablet Chew 81 mg by mouth daily.      [name] atenolol (TENORMIN) 100 MG tablet Take 1 tablet by mouth daily.      . Biotin 5000 MCG CAPS Take 10,000 mcg by mouth continuous dialysis.      . Calcium Carbonate-Vitamin D (CALCIUM + D) 600-200 MG-UNIT TABS Take 1 tablet by mouth daily.      . Cholecalciferol (VITAMIN D-3 PO) Take 400 Units by mouth daily.      . Cholecalciferol (VITAMIN D3) 2000 UNITS CHEW Chew 1 each by mouth daily.      . Cyanocobalamin (VITAMIN B-12 IJ) Inject as directed every 30 (thirty) days.      . dorzolamide (TRUSOPT) 2 % ophthalmic solution Place 1 drop into both eyes 3 (three) times daily.      [name] latanoprost (XALATAN) 0.005 % ophthalmic solution Place 1 drop into both eyes at bedtime.      [name] losartan (COZAAR) 50 MG tablet Take 1 tablet by mouth daily.      . mupirocin ointment (BACTROBAN) 2 % 2 (two) times daily.      . naproxen (NAPROSYN) 500 MG tablet Take 500 mg by mouth 2 (two) times daily as needed. For pain      . ondansetron (ZOFRAN ODT) 8 MG disintegrating tablet Take 1 tablet (8 mg total) by mouth every 8 (eight)  hours as needed for nausea.  20 tablet  0  . ranitidine (ZANTAC) 150 MG tablet Take 150 mg by mouth 2 (two) times daily.      . sertraline (ZOLOFT) 100 MG tablet Take 1 tablet by mouth daily.      . simvastatin (ZOCOR) 10 MG tablet Take 10 mg by mouth at bedtime.      . traMADol (ULTRAM) 50 MG tablet Take 50-100 mg by mouth every 6 (six) hours as needed. For pain       No current facility-administered medications for this visit.    Allergies  Allergen Reactions  . Codeine Shortness Of Breath and Nausea And Vomiting  . Hydrocodone Shortness Of Breath and Nausea And Vomiting    History   Social History  . Marital Status: Married    Spouse Name: N/A    Number of Children: N/A  . Years of Education: N/A   Occupational History  . Not on file.   Social History Main Topics  . Smoking status: Never Smoker   . Smokeless tobacco: Never Used  . Alcohol Use: No  . Drug Use: No  . Sexual Activity: Not on file   Other Topics Concern  . Not on file   Social History Narrative  .  No narrative on file     Review of Systems: General: negative for chills, fever, night sweats or weight changes.  Cardiovascular: negative for chest pain, dyspnea on exertion, edema, orthopnea, palpitations, paroxysmal nocturnal dyspnea or shortness of breath Dermatological: negative for rash Respiratory: negative for cough or wheezing Urologic: negative for hematuria Abdominal: negative for nausea, vomiting, diarrhea, bright red blood per rectum, melena, or hematemesis Neurologic: negative for visual changes, syncope, or dizziness All other systems reviewed and are otherwise negative except as noted above.    Blood pressure 140/80, pulse 58, height 5' 8.5" (1.74 m), weight 178 lb 3.2 oz (80.831 kg).  General appearance: alert and no distress Neck: no adenopathy, no carotid bruit, no JVD, supple, symmetrical, trachea midline and thyroid not enlarged, symmetric, no tenderness/mass/nodules Lungs: clear to  auscultation bilaterally Heart: regular rate and rhythm, S1, S2 normal, no murmur, click, rub or gallop Extremities: extremities normal, atraumatic, no cyanosis or edema  EKG Sinus bradycardia at 58 with nonspecific ST and T wave changes, unchanged from prior EKGs.  ASSESSMENT AND PLAN:   Hyperlipidemia On statin therapy followed by her PCP  Hypertension Well-controlled on current medications      Runell Gess MD [facility], [facility] 11/04/2013 9:27 AM

## 2013-11-04 NOTE — Patient Instructions (Signed)
Your physician wants you to follow-up in: 1 year with Dr Berry. You will receive a reminder letter in the mail two months in advance. If you don't receive a letter, please call our office to schedule the follow-up appointment.  

## 2013-11-04 NOTE — Assessment & Plan Note (Signed)
Well-controlled on current medications 

## 2014-04-28 ENCOUNTER — Other Ambulatory Visit: Payer: Self-pay

## 2014-04-28 DIAGNOSIS — Z1231 Encounter for screening mammogram for malignant neoplasm of breast: Secondary | ICD-10-CM

## 2014-06-08 ENCOUNTER — Ambulatory Visit
Admission: RE | Admit: 2014-06-08 | Discharge: 2014-06-08 | Disposition: A | Discharge status: Home / Self Care | Payer: Medicare Other | Source: Ambulatory Visit

## 2014-06-08 DIAGNOSIS — Z1231 Encounter for screening mammogram for malignant neoplasm of breast: Secondary | ICD-10-CM

## 2014-06-14 ENCOUNTER — Emergency Department (HOSPITAL_COMMUNITY)
Admission: EM | Admit: 2014-06-14 | Discharge: 2014-06-14 | Disposition: A | Discharge status: Home / Self Care | Payer: Medicare Other | Attending: Emergency Medicine | Admitting: Emergency Medicine

## 2014-06-14 ENCOUNTER — Encounter (HOSPITAL_COMMUNITY): Payer: Self-pay | Admitting: Emergency Medicine

## 2014-06-14 ENCOUNTER — Emergency Department (HOSPITAL_COMMUNITY): Payer: Medicare Other

## 2014-06-14 DIAGNOSIS — Y929 Unspecified place or not applicable: Secondary | ICD-10-CM | POA: Insufficient documentation

## 2014-06-14 DIAGNOSIS — Z791 Long term (current) use of non-steroidal anti-inflammatories (NSAID): Secondary | ICD-10-CM | POA: Insufficient documentation

## 2014-06-14 DIAGNOSIS — Z7982 Long term (current) use of aspirin: Secondary | ICD-10-CM | POA: Insufficient documentation

## 2014-06-14 DIAGNOSIS — S0990XA Unspecified injury of head, initial encounter: Secondary | ICD-10-CM | POA: Insufficient documentation

## 2014-06-14 DIAGNOSIS — Z79899 Other long term (current) drug therapy: Secondary | ICD-10-CM | POA: Insufficient documentation

## 2014-06-14 DIAGNOSIS — Q211 Atrial septal defect: Secondary | ICD-10-CM | POA: Insufficient documentation

## 2014-06-14 DIAGNOSIS — Q2111 Secundum atrial septal defect: Secondary | ICD-10-CM | POA: Insufficient documentation

## 2014-06-14 DIAGNOSIS — E785 Hyperlipidemia, unspecified: Secondary | ICD-10-CM | POA: Insufficient documentation

## 2014-06-14 DIAGNOSIS — W1809XA Striking against other object with subsequent fall, initial encounter: Secondary | ICD-10-CM | POA: Insufficient documentation

## 2014-06-14 DIAGNOSIS — I10 Essential (primary) hypertension: Secondary | ICD-10-CM | POA: Insufficient documentation

## 2014-06-14 DIAGNOSIS — H538 Other visual disturbances: Secondary | ICD-10-CM | POA: Insufficient documentation

## 2014-06-14 DIAGNOSIS — M129 Arthropathy, unspecified: Secondary | ICD-10-CM | POA: Insufficient documentation

## 2014-06-14 DIAGNOSIS — Y9389 Activity, other specified: Secondary | ICD-10-CM | POA: Insufficient documentation

## 2014-06-14 NOTE — ED Notes (Signed)
MD at bedside. 

## 2014-06-14 NOTE — ED Provider Notes (Signed)
CSN: [id number]     Arrival date & time 06/14/14  1602 History   First MD Initiated Contact with Patient 06/14/14 1740     Chief Complaint  Patient presents with  . Fall     (Consider location/radiation/quality/duration/timing/severity/associated sxs/prior Treatment) HPI 80 year old female with unsteady gait at baseline lost her balance and fell when she was trying to hold off of a folding chair which collapsed causing her to fall and she hit the back of her head on the ground outside and has no headache or loss of consciousness however had transient blurred vision without blindness or double vision or vertigo after her fall it lasted several minutes and is now resolved. She has no neck pain no back pain no chest pain no shortness breath no palpitations no focal or lateralizing weakness or numbness or other concerns. There is no change in speech swallowing or understanding. Her tetanus shot is up-to-date. Was no treatment prior to arrival. Her only concern now is that she has a small amount of bleeding from the back of her head and wants to make sure she does not have a scalp laceration. Past Medical History  Diagnosis Date  . Hypertension   . Arthritis   . Depression   . Hyperlipidemia   . PFO (patent foramen ovale)     small  . H/O echocardiogram 2008    prolapse of post. mitral leaflet, tr MR, tr TR, EF >55%, mild concentric LVH  . H/O cardiovascular stress test 10/13/2007    low risk scan, no changes from previous test  . Heart palpitations     controlled on BB  . Hiatal hernia    Past Surgical History  Procedure Laterality Date  . Cholecystectomy    . Cardiac catheterization  10/2002    normal coronary arteries, normal LV function   Family History  Problem Relation Age of Onset  . Heart disease Mother   . Cancer Father   . Kidney disease Brother   . Hypertension Daughter   . Hyperlipidemia Son    History  Substance Use Topics  . Smoking status: Never Smoker   .  Smokeless tobacco: Never Used  . Alcohol Use: No   OB History   Grav Para Term Preterm Abortions TAB SAB Ect Mult Living                 Review of Systems  10 Systems reviewed and are negative for acute change except as noted in the HPI.  Allergies  Codeine and Hydrocodone  Home Medications   Prior to Admission medications   Medication Sig Start Date End Date Taking? Authorizing Provider  aspirin 81 MG chewable tablet Chew 81 mg by mouth daily.   Yes Historical Provider, MD  atenolol (TENORMIN) 100 MG tablet Take 1 tablet by mouth daily. 08/26/13  Yes Historical Provider, MD  Biotin 5000 MCG CAPS Take 10,000 mcg by mouth continuous dialysis.   Yes Historical Provider, MD  Calcium Carbonate-Vitamin D (CALCIUM + D) 600-200 MG-UNIT TABS Take 1 tablet by mouth daily.   Yes Historical Provider, MD  Coenzyme Q10 (CO Q 10) 10 MG CAPS Take 1 capsule by mouth daily.   Yes Historical Provider, MD  Cyanocobalamin (VITAMIN B-12 IJ) Inject as directed every 30 (thirty) days.   Yes Historical Provider, MD  latanoprost (XALATAN) 0.005 % ophthalmic solution Place 1 drop into both eyes at bedtime.   Yes Historical Provider, MD  losartan (COZAAR) 100 MG tablet Take 100 mg by mouth  daily.   Yes Historical Provider, MD  naproxen (NAPROSYN) 500 MG tablet Take 500 mg by mouth daily. For pain   Yes Historical Provider, MD  ranitidine (ZANTAC) 150 MG tablet Take 150 mg by mouth 2 (two) times daily.   Yes Historical Provider, MD  sertraline (ZOLOFT) 100 MG tablet Take 1 tablet by mouth daily. 08/11/13  Yes Historical Provider, MD  SIMBRINZA 1-0.2 % SUSP Place 1 drop into both eyes 3 (three) times daily. 06/14/14  Yes Historical Provider, MD  simvastatin (ZOCOR) 10 MG tablet Take 10 mg by mouth at bedtime.   Yes Historical Provider, MD  traMADol (ULTRAM) 50 MG tablet Take 50-100 mg by mouth every 6 (six) hours as needed. For pain   Yes Historical Provider, MD   BP 153/65  Pulse 55  Temp(Src) 98 F (36.7 C)  (Oral)  Resp 16  Ht 5' 8.5" (1.74 m)  Wt 172 lb (78.019 kg)  BMI 25.77 kg/m2  SpO2 99% Physical Exam  Nursing note and vitals reviewed. Constitutional:  Awake, alert, nontoxic appearance with baseline speech for patient.  HENT:  Mouth/Throat: No oropharyngeal exudate.  Mild occipital scalp abrasion without swelling  Eyes: EOM are normal. Pupils are equal, round, and reactive to light. Right eye exhibits no discharge. Left eye exhibits no discharge.  Neck: Neck supple.  Cardiovascular: Normal rate and regular rhythm.   No murmur heard. Pulmonary/Chest: Effort normal and breath sounds normal. No stridor. No respiratory distress. She has no wheezes. She has no rales. She exhibits no tenderness.  Abdominal: Soft. Bowel sounds are normal. She exhibits no mass. There is no tenderness. There is no rebound.  Musculoskeletal: She exhibits no tenderness.  Baseline ROM, moves extremities with no obvious new focal weakness.  Lymphadenopathy:    She has no cervical adenopathy.  Neurological: She is alert.  Awake, alert, cooperative and aware of situation; motor strength bilaterally; sensation normal to light touch bilaterally; peripheral visual fields full to confrontation; no facial asymmetry; tongue midline; major cranial nerves appear intact; no pronator drift, normal finger to nose bilaterally, baseline gait without new ataxia.  Skin: No rash noted.  Psychiatric: She has a normal mood and affect.    ED Course  Procedures (including critical care time) Patient / Family / Caregiver informed of clinical course, understand medical decision-making process, and agree with plan. Labs Review Labs Reviewed - No data to display  Imaging Review No results found. Ct Head Wo Contrast  06/14/2014   CLINICAL DATA:  Headache after head injury.  EXAM: CT HEAD WITHOUT CONTRAST  TECHNIQUE: Contiguous axial images were obtained from the base of the skull through the vertex without intravenous contrast.   COMPARISON:  None.  FINDINGS: Bony calvarium appears intact. Mild diffuse cortical atrophy is noted. No mass effect or midline shift is noted. Ventricular size is within normal limits. There is no evidence of mass lesion, hemorrhage or acute infarction.  IMPRESSION: Mild diffuse cortical atrophy. No acute intracranial abnormality seen.   Electronically Signed   By: Roque Lias M.D.   On: 06/14/2014 18:27    EKG Interpretation None      MDM   Final diagnoses:  Minor head injury without loss of consciousness, initial encounter   I doubt any other EMC precluding discharge at this time including, but not necessarily limited to the following:ICH, CSI.     Hurman Horn, MD 06/18/14 [phone]

## 2014-06-14 NOTE — ED Notes (Signed)
Pt states she was attempting to place a bucket of squash in a chair and the chair folded up on her. States she fell and hit the back of her head. Pt states she has a laceration to the back of her head. No LOC but states things were black for a few seconds

## 2014-06-14 NOTE — Discharge Instructions (Signed)

## 2014-07-18 ENCOUNTER — Emergency Department (HOSPITAL_COMMUNITY): Payer: Medicare Other

## 2014-07-18 ENCOUNTER — Encounter (HOSPITAL_COMMUNITY): Payer: Self-pay | Admitting: Emergency Medicine

## 2014-07-18 ENCOUNTER — Inpatient Hospital Stay (HOSPITAL_COMMUNITY)
Admission: EM | Admit: 2014-07-18 | Discharge: 2014-07-23 | DRG: 572 | Disposition: A | Discharge status: Home Health | Payer: Medicare Other | Attending: Family Medicine | Admitting: Family Medicine

## 2014-07-18 DIAGNOSIS — L0291 Cutaneous abscess, unspecified: Secondary | ICD-10-CM

## 2014-07-18 DIAGNOSIS — L97509 Non-pressure chronic ulcer of other part of unspecified foot with unspecified severity: Secondary | ICD-10-CM | POA: Diagnosis present

## 2014-07-18 DIAGNOSIS — N390 Urinary tract infection, site not specified: Secondary | ICD-10-CM | POA: Diagnosis present

## 2014-07-18 DIAGNOSIS — M129 Arthropathy, unspecified: Secondary | ICD-10-CM | POA: Diagnosis present

## 2014-07-18 DIAGNOSIS — L02619 Cutaneous abscess of unspecified foot: Principal | ICD-10-CM | POA: Diagnosis present

## 2014-07-18 DIAGNOSIS — L851 Acquired keratosis [keratoderma] palmaris et plantaris: Secondary | ICD-10-CM | POA: Diagnosis present

## 2014-07-18 DIAGNOSIS — Z7982 Long term (current) use of aspirin: Secondary | ICD-10-CM | POA: Diagnosis not present

## 2014-07-18 DIAGNOSIS — Z79899 Other long term (current) drug therapy: Secondary | ICD-10-CM

## 2014-07-18 DIAGNOSIS — L03115 Cellulitis of right lower limb: Secondary | ICD-10-CM

## 2014-07-18 DIAGNOSIS — E785 Hyperlipidemia, unspecified: Secondary | ICD-10-CM | POA: Diagnosis present

## 2014-07-18 DIAGNOSIS — L0390 Cellulitis, unspecified: Secondary | ICD-10-CM | POA: Diagnosis present

## 2014-07-18 DIAGNOSIS — I10 Essential (primary) hypertension: Secondary | ICD-10-CM | POA: Diagnosis present

## 2014-07-18 DIAGNOSIS — M79609 Pain in unspecified limb: Secondary | ICD-10-CM | POA: Diagnosis present

## 2014-07-18 DIAGNOSIS — L97519 Non-pressure chronic ulcer of other part of right foot with unspecified severity: Secondary | ICD-10-CM | POA: Diagnosis present

## 2014-07-18 DIAGNOSIS — Z8249 Family history of ischemic heart disease and other diseases of the circulatory system: Secondary | ICD-10-CM

## 2014-07-18 DIAGNOSIS — L03119 Cellulitis of unspecified part of limb: Principal | ICD-10-CM

## 2014-07-18 LAB — CBC WITH DIFFERENTIAL/PLATELET
BASOS ABS: 0 10*3/uL (ref 0.0–0.1)
BASOS PCT: 0 % (ref 0–1)
EOS ABS: 0.1 10*3/uL (ref 0.0–0.7)
EOS PCT: 0 % (ref 0–5)
HCT: 34 % — ABNORMAL LOW (ref 36.0–46.0)
Hemoglobin: 11.4 g/dL — ABNORMAL LOW (ref 12.0–15.0)
Lymphocytes Relative: 7 % — ABNORMAL LOW (ref 12–46)
Lymphs Abs: 0.9 10*3/uL (ref 0.7–4.0)
MCH: 31 pg (ref 26.0–34.0)
MCHC: 33.5 g/dL (ref 30.0–36.0)
MCV: 92.4 fL (ref 78.0–100.0)
MONOS PCT: 6 % (ref 3–12)
Monocytes Absolute: 0.7 10*3/uL (ref 0.1–1.0)
NEUTROS ABS: 10.3 10*3/uL — AB (ref 1.7–7.7)
Neutrophils Relative %: 87 % — ABNORMAL HIGH (ref 43–77)
Platelets: 201 10*3/uL (ref 150–400)
RBC: 3.68 MIL/uL — ABNORMAL LOW (ref 3.87–5.11)
RDW: 13 % (ref 11.5–15.5)
WBC: 12 10*3/uL — ABNORMAL HIGH (ref 4.0–10.5)

## 2014-07-18 LAB — COMPREHENSIVE METABOLIC PANEL
ALT: 12 U/L (ref 0–35)
ANION GAP: 12 (ref 5–15)
AST: 14 U/L (ref 0–37)
Albumin: 3.4 g/dL — ABNORMAL LOW (ref 3.5–5.2)
Alkaline Phosphatase: 79 U/L (ref 39–117)
BILIRUBIN TOTAL: 0.8 mg/dL (ref 0.3–1.2)
BUN: 16 mg/dL (ref 6–23)
CALCIUM: 9.2 mg/dL (ref 8.4–10.5)
CHLORIDE: 107 meq/L (ref 96–112)
CO2: 24 mEq/L (ref 19–32)
CREATININE: 0.85 mg/dL (ref 0.50–1.10)
GFR calc Af Amer: 74 mL/min — ABNORMAL LOW (ref >90)
GFR calc non Af Amer: 63 mL/min — ABNORMAL LOW (ref >90)
Glucose, Bld: 101 mg/dL — ABNORMAL HIGH (ref 70–99)
Potassium: 3.8 mEq/L (ref 3.7–5.3)
Sodium: 143 mEq/L (ref 137–147)
TOTAL PROTEIN: 6.5 g/dL (ref 6.0–8.3)

## 2014-07-18 LAB — URINE MICROSCOPIC-ADD ON

## 2014-07-18 LAB — URINALYSIS, ROUTINE W REFLEX MICROSCOPIC
Bilirubin Urine: NEGATIVE
GLUCOSE, UA: NEGATIVE mg/dL
HGB URINE DIPSTICK: NEGATIVE
KETONES UR: NEGATIVE mg/dL
Nitrite: NEGATIVE
PROTEIN: NEGATIVE mg/dL
Specific Gravity, Urine: 1.01 (ref 1.005–1.030)
Urobilinogen, UA: 0.2 mg/dL (ref 0.0–1.0)
pH: 7 (ref 5.0–8.0)

## 2014-07-18 LAB — LACTIC ACID, PLASMA: LACTIC ACID, VENOUS: 0.8 mmol/L (ref 0.5–2.2)

## 2014-07-18 MED ORDER — CALCIUM CARBONATE-VITAMIN D 500-200 MG-UNIT PO TABS
1.0000 | ORAL_TABLET | Freq: Every day | ORAL | Status: DC
  Administered 2014-07-18 – 2014-07-23 (×6): 1 via ORAL
  Filled 2014-07-18 (×6): qty 1

## 2014-07-18 MED ORDER — LATANOPROST 0.005 % OP SOLN
1.0000 [drp] | Freq: Every day | OPHTHALMIC | Status: DC
  Administered 2014-07-20 – 2014-07-22 (×4): 1 [drp] via OPHTHALMIC
  Filled 2014-07-18: qty 2.5

## 2014-07-18 MED ORDER — VANCOMYCIN HCL IN DEXTROSE 1-5 GM/200ML-% IV SOLN
1000.0000 mg | Freq: Once | INTRAVENOUS | Status: AC
  Administered 2014-07-18: 1000 mg via INTRAVENOUS
  Filled 2014-07-18: qty 200

## 2014-07-18 MED ORDER — ASPIRIN 81 MG PO CHEW
81.0000 mg | CHEWABLE_TABLET | Freq: Every day | ORAL | Status: DC
  Administered 2014-07-18 – 2014-07-22 (×4): 81 mg via ORAL
  Filled 2014-07-18 (×5): qty 1

## 2014-07-18 MED ORDER — ATENOLOL 25 MG PO TABS
100.0000 mg | ORAL_TABLET | Freq: Every day | ORAL | Status: DC
  Administered 2014-07-18 – 2014-07-22 (×4): 100 mg via ORAL
  Filled 2014-07-18 (×5): qty 4

## 2014-07-18 MED ORDER — SIMVASTATIN 10 MG PO TABS
10.0000 mg | ORAL_TABLET | Freq: Every day | ORAL | Status: DC
  Administered 2014-07-18 – 2014-07-22 (×5): 10 mg via ORAL
  Filled 2014-07-18 (×5): qty 1

## 2014-07-18 MED ORDER — TRAMADOL HCL 50 MG PO TABS
50.0000 mg | ORAL_TABLET | Freq: Four times a day (QID) | ORAL | Status: DC | PRN
  Administered 2014-07-20: 50 mg via ORAL
  Filled 2014-07-18: qty 1

## 2014-07-18 MED ORDER — ACETAMINOPHEN 650 MG RE SUPP: 650.0000 mg | Freq: Four times a day (QID) | RECTAL | Status: DC | PRN

## 2014-07-18 MED ORDER — FAMOTIDINE 20 MG PO TABS
20.0000 mg | ORAL_TABLET | Freq: Two times a day (BID) | ORAL | Status: DC
  Administered 2014-07-18 – 2014-07-23 (×10): 20 mg via ORAL
  Filled 2014-07-18 (×10): qty 1

## 2014-07-18 MED ORDER — VANCOMYCIN HCL 10 G IV SOLR
1500.0000 mg | Freq: Once | INTRAVENOUS | Status: DC
  Filled 2014-07-18: qty 1500

## 2014-07-18 MED ORDER — PIPERACILLIN-TAZOBACTAM 3.375 G IVPB
3.3750 g | Freq: Three times a day (TID) | INTRAVENOUS | Status: DC
  Administered 2014-07-18 – 2014-07-23 (×14): 3.375 g via INTRAVENOUS
  Filled 2014-07-18 (×25): qty 50

## 2014-07-18 MED ORDER — VANCOMYCIN HCL IN DEXTROSE 750-5 MG/150ML-% IV SOLN
750.0000 mg | Freq: Two times a day (BID) | INTRAVENOUS | Status: DC
  Administered 2014-07-18 – 2014-07-21 (×6): 750 mg via INTRAVENOUS
  Filled 2014-07-18 (×6): qty 150

## 2014-07-18 MED ORDER — POTASSIUM CHLORIDE IN NACL 20-0.9 MEQ/L-% IV SOLN
INTRAVENOUS | Status: DC
  Administered 2014-07-18 – 2014-07-21 (×4): via INTRAVENOUS

## 2014-07-18 MED ORDER — LATANOPROST 0.005 % OP SOLN
OPHTHALMIC | Status: AC
  Filled 2014-07-18: qty 2.5

## 2014-07-18 MED ORDER — ONDANSETRON HCL 4 MG/2ML IJ SOLN: 4.0000 mg | Freq: Four times a day (QID) | INTRAMUSCULAR | Status: DC | PRN

## 2014-07-18 MED ORDER — ACETAMINOPHEN 325 MG PO TABS
650.0000 mg | ORAL_TABLET | Freq: Four times a day (QID) | ORAL | Status: DC | PRN
Start: 2014-07-18 — End: 2014-07-23

## 2014-07-18 MED ORDER — SERTRALINE HCL 50 MG PO TABS
100.0000 mg | ORAL_TABLET | Freq: Every day | ORAL | Status: DC
  Administered 2014-07-18 – 2014-07-22 (×5): 100 mg via ORAL
  Filled 2014-07-18 (×5): qty 2

## 2014-07-18 MED ORDER — ENOXAPARIN SODIUM 40 MG/0.4ML ~~LOC~~ SOLN
40.0000 mg | SUBCUTANEOUS | Status: DC
  Administered 2014-07-18 – 2014-07-22 (×4): 40 mg via SUBCUTANEOUS
  Filled 2014-07-18 (×4): qty 0.4

## 2014-07-18 MED ORDER — NAPROXEN 250 MG PO TABS
500.0000 mg | ORAL_TABLET | Freq: Every day | ORAL | Status: DC
  Administered 2014-07-18 – 2014-07-19 (×2): 500 mg via ORAL
  Filled 2014-07-18 (×3): qty 2

## 2014-07-18 MED ORDER — LOSARTAN POTASSIUM 50 MG PO TABS
100.0000 mg | ORAL_TABLET | Freq: Every day | ORAL | Status: DC
  Administered 2014-07-18 – 2014-07-22 (×5): 100 mg via ORAL
  Filled 2014-07-18 (×5): qty 2

## 2014-07-18 MED ORDER — ONDANSETRON HCL 4 MG PO TABS
4.0000 mg | ORAL_TABLET | Freq: Four times a day (QID) | ORAL | Status: DC | PRN
Start: 2014-07-18 — End: 2014-07-23

## 2014-07-18 NOTE — ED Notes (Signed)
Dr Knapp at bedside,  

## 2014-07-18 NOTE — H&P (Addendum)
[facility] History and Physical  RICKIE GUTIERRES ZOX:[id number] DOB: 07/22/1933 DOA: 07/18/2014  Referring physician: Ivery Quale, PA PCP: Colette Ribas, MD   Chief Complaint: swelling in foot  HPI: Cassandra [name] is a 80 y.o. female who was in her usual state of health approximately one week ago she had felt a small rock in her shoe. She did not pay any attention to it, and reports the following day she removed from her foot. Over the next week, she noticed worsening swelling, redness, pain in her right foot. She reports subjective fevers, although did not check her temperature. She describes some mild serous discharge from the wound. She is unable to walk on her foot. She was brought to the emergency room for evaluation where her foot was noted to be significantly swollen, erythematous. CT scan of the foot did not show any underlying foreign body or abscess. She is being admitted for further treatments   Review of Systems:  Pertinent positives as per HPI, otherwise negative  Past Medical History  Diagnosis Date  . Hypertension   . Arthritis   . Depression   . Hyperlipidemia   . PFO (patent foramen ovale)     small  . H/O echocardiogram 2008    prolapse of post. mitral leaflet, tr MR, tr TR, EF >55%, mild concentric LVH  . H/O cardiovascular stress test 10/13/2007    low risk scan, no changes from previous test  . Heart palpitations     controlled on BB  . Hiatal hernia    Past Surgical History  Procedure Laterality Date  . Cholecystectomy    . Cardiac catheterization  10/2002    normal coronary arteries, normal LV function   Social History:  reports that she has never smoked. She has never used smokeless tobacco. She reports that she does not drink alcohol or use illicit drugs.  Allergies  Allergen Reactions  . Codeine Shortness Of Breath and Nausea And Vomiting  . Hydrocodone Shortness Of Breath and Nausea And Vomiting    Family History  Problem  Relation Age of Onset  . Heart disease Mother   . Cancer Father   . Kidney disease Brother   . Hypertension Daughter   . Hyperlipidemia Son      Prior to Admission medications   Medication Sig Start Date End Date Taking? Authorizing Provider  aspirin 81 MG chewable tablet Chew 81 mg by mouth daily.   Yes Historical Provider, MD  atenolol (TENORMIN) 100 MG tablet Take 1 tablet by mouth daily. 08/26/13  Yes Historical Provider, MD  Biotin 5000 MCG CAPS Take 10,000 mcg by mouth daily.    Yes Historical Provider, MD  Calcium Carbonate-Vitamin D (CALCIUM + D) 600-200 MG-UNIT TABS Take 1 tablet by mouth daily.   Yes Historical Provider, MD  carboxymethylcellulose (REFRESH PLUS) 0.5 % SOLN Place 1 drop into both eyes 3 (three) times daily as needed (Dry Eyes).   Yes Historical Provider, MD  Coenzyme Q10 (CO Q 10) 10 MG CAPS Take 10 mg by mouth daily.    Yes Historical Provider, MD  Cyanocobalamin (VITAMIN B-12 IJ) Inject as directed every 30 (thirty) days.   Yes Historical Provider, MD  latanoprost (XALATAN) 0.005 % ophthalmic solution Place 1 drop into both eyes at bedtime.   Yes Historical Provider, MD  losartan (COZAAR) 100 MG tablet Take 100 mg by mouth daily.   Yes Historical Provider, MD  Multiple Vitamin (MULTIVITAMIN WITH MINERALS) TABS tablet Take 2 tablets by  mouth daily.   Yes Historical Provider, MD  naproxen (NAPROSYN) 500 MG tablet Take 500 mg by mouth daily. For pain   Yes Historical Provider, MD  ranitidine (ZANTAC) 150 MG tablet Take 150 mg by mouth 2 (two) times daily.   Yes Historical Provider, MD  sertraline (ZOLOFT) 100 MG tablet Take 100 mg by mouth daily.  08/11/13  Yes Historical Provider, MD  SIMBRINZA 1-0.2 % SUSP Place 1 drop into both eyes 3 (three) times daily. 06/14/14  Yes Historical Provider, MD  simvastatin (ZOCOR) 10 MG tablet Take 10 mg by mouth at bedtime.   Yes Historical Provider, MD  traMADol (ULTRAM) 50 MG tablet Take 50-100 mg by mouth every 6 (six) hours as  needed. For pain   Yes Historical Provider, MD   Physical Exam: Filed Vitals:   07/18/14 1131 07/18/14 1206 07/18/14 1259 07/18/14 1505  BP: 110/55 121/51  115/52  Pulse: 61 62  64  Temp: 97.9 F (36.6 C) 98 F (36.7 C)  99 F (37.2 C)  TempSrc: Oral Oral  Oral  Resp: 16   16  Height:  5\' 8"  (1.727 m)    Weight:   78.926 kg (174 lb)   SpO2: 97% 99%  98%    Wt Readings from Last 3 Encounters:  07/18/14 78.926 kg (174 lb)  06/14/14 78.019 kg (172 lb)  11/04/13 80.831 kg (178 lb 3.2 oz)    General:  Appears calm and comfortable Eyes: PERRL, normal lids, irises & conjunctiva ENT: grossly normal hearing, lips & tongue Neck: no LAD, masses or thyromegaly Cardiovascular: RRR, no m/r/g. No LE edema. Telemetry: SR, no arrhythmias  Respiratory: CTA bilaterally, no w/r/r. Normal respiratory effort. Abdomen: soft, ntnd Skin: Right foot appears to be edematous, erythematous and warm, more towards the medial aspect. On the plantar surface, there is a swollen area under callus that appears to be draining serous fluid Musculoskeletal: grossly normal tone BUE/BLE Psychiatric: grossly normal mood and affect, speech fluent and appropriate Neurologic: grossly non-focal.          Labs on Admission:  Basic Metabolic Panel:  Recent Labs Lab 07/18/14 0927  NA 143  K 3.8  CL 107  CO2 24  GLUCOSE 101*  BUN 16  CREATININE 0.85  CALCIUM 9.2   Liver Function Tests:  Recent Labs Lab 07/18/14 0927  AST 14  ALT 12  ALKPHOS 79  BILITOT 0.8  PROT 6.5  ALBUMIN 3.4*   No results found for this basename: LIPASE, AMYLASE,  in the last 168 hours No results found for this basename: AMMONIA,  in the last 168 hours CBC:  Recent Labs Lab 07/18/14 0927  WBC 12.0*  NEUTROABS 10.3*  HGB 11.4*  HCT 34.0*  MCV 92.4  PLT 201   Cardiac Enzymes: No results found for this basename: CKTOTAL, CKMB, CKMBINDEX, TROPONINI,  in the last 168 hours  BNP (last 3 results) No results found for  this basename: PROBNP,  in the last 8760 hours CBG: No results found for this basename: GLUCAP,  in the last 168 hours  Radiological Exams on Admission: Ct Foot Right Wo Contrast  07/18/2014   CLINICAL DATA:  Patient stepped on stone a few days ago later removing a small piece of drop. Now with foot pain and swelling. Questionable residual foreign body.  EXAM: CT OF THE RIGHT FOOT WITHOUT CONTRAST  TECHNIQUE: Multidetector CT imaging was performed according to the standard protocol. Multiplanar CT image reconstructions were also generated.  COMPARISON:  None.  FINDINGS: There is diffuse soft tissue edema over the plantar aspect of the forefoot centered on the first metatarsal head and first metatarsophalangeal joint. There is no radiopaque foreign body. There is no defined fluid collection to suggest an abscess.  No fracture. There is no bone resorption to suggest osteomyelitis. There are arthropathic changes involving multiple interphalangeal joints. Moderate-sized plantar and dorsal calcaneal spurs are noted. Bones are diffusely demineralized.  IMPRESSION: 1. No radiopaque foreign body. 2. No fracture. 3. No evidence of osteomyelitis. 4. Diffuse soft tissue edema/cellulitis centered along the plantar aspect of the forefoot below the first metatarsal head and first metatarsophalangeal joint. No defined abscess.   Electronically Signed   By: Amie Portland M.D.   On: 07/18/2014 10:04    Assessment/Plan Active Problems:   Hypertension   Hyperlipidemia   Cellulitis   Cellulitis of foot, right   1. Cellulitis of right foot. CT imaging does not indicate any underlying abscess. We'll treat empirically with intravenous antibiotics with vancomycin and Zosyn. Keep foot elevated. 2. Hypertension. Continue outpatient regimen 3. Hyperlipidemia. Continue statin 4.  possible UTI. Urinalysis indicates significant WBCs in urine. We'll followup on urine culture. She is on antibiotics.  Code Status: Full  code DVT Prophylaxis: Lovenox Family Communication: Discussed with patient and family at the bedside Disposition Plan: Discharge home once improved   Time spent:  [facility] Pager [phone]  **Disclaimer: This note may have been dictated with voice recognition software. Similar sounding words can inadvertently be transcribed and this note may contain transcription errors which may not have been corrected upon publication of note.**

## 2014-07-18 NOTE — Progress Notes (Signed)
ANTIBIOTIC CONSULT NOTE - INITIAL  Pharmacy Consult for Vancomycin and Zosyn Indication: cellulitis  Allergies  Allergen Reactions  . Codeine Shortness Of Breath and Nausea And Vomiting  . Hydrocodone Shortness Of Breath and Nausea And Vomiting   Patient Measurements: Height: 5\' 8"  (172.7 cm) Weight: 174 lb (78.926 kg) IBW/kg (Calculated) : 63.9  Vital Signs: Temp: 98 F (36.7 C) (08/16 1206) Temp src: Oral (08/16 1206) BP: 121/51 mmHg (08/16 1206) Pulse Rate: 62 (08/16 1206) Intake/Output from previous day:   Intake/Output from this shift:    Labs:  Recent Labs  07/18/14 0927  WBC 12.0*  HGB 11.4*  PLT 201  CREATININE 0.85   Estimated Creatinine Clearance: 59.2 ml/min (by C-G formula based on Cr of 0.85). No results found for this basename: VANCOTROUGH, VANCOPEAK, VANCORANDOM, GENTTROUGH, GENTPEAK, GENTRANDOM, TOBRATROUGH, TOBRAPEAK, TOBRARND, AMIKACINPEAK, AMIKACINTROU, AMIKACIN,  in the last 72 hours   Microbiology: No results found for this or any previous visit (from the past 720 hour(s)).  Medical History: Past Medical History  Diagnosis Date  . Hypertension   . Arthritis   . Depression   . Hyperlipidemia   . PFO (patent foramen ovale)     small  . H/O echocardiogram 2008    prolapse of post. mitral leaflet, tr MR, tr TR, EF >55%, mild concentric LVH  . H/O cardiovascular stress test 10/13/2007    low risk scan, no changes from previous test  . Heart palpitations     controlled on BB  . Hiatal hernia    Vancomycin 8/16>> Zosyn 8/16>>  Assessment: 80yo female admitted with cellulitis of foot.  Renal fxn is at baseline.  Estimated Creatinine Clearance: 59.2 ml/min (by C-G formula based on Cr of 0.85).  Pt received Vancomycin 1000mg  IV on admission  Goal of Therapy:  Vancomycin trough level 10-15 mcg/ml Eradicate infection.  Plan:  Vancomycin 750mg  IV q12hrs Zosyn 3.375gm IV q8h, each dose over 4 hrs Check Vancomycin trough at steady  state Monitor labs, renal fxn, and cultures  Cassandra [name], Cassandra [name] 07/18/2014,1:09 PM

## 2014-07-18 NOTE — ED Notes (Signed)
Report given to floor, Morrie SheldonAshley, [employer]

## 2014-07-18 NOTE — ED Provider Notes (Signed)
CSN: [id number]     Arrival date & time 07/18/14  0848 History   First MD Initiated Contact with Patient 07/18/14 [phone]     Chief Complaint  Patient presents with  . Flank Pain     (Consider location/radiation/quality/duration/timing/severity/associated sxs/prior Treatment) HPI Comments: Patient is a 80 year old female who presents to the emergency department with complaint of redness and swelling of the right first toe, as well as bilateral flank pain.  The patient states that about 2 weeks ago she stepped on a" black rock". A piece of the rock went through her shoe and into her foot. She has gradually noted increasing swelling and redness and warmth of the right great toe. She states she's noticed a little bit of" yellow" drainage from the bottom of her foot. She states at times she gets some tingling in her first and second toes. She also states that yesterday she noted some chills and felt as though she had the flu. She has only mild to moderate pain of the foot. She has not measured any temperature elevation.  The patient states that she passed a kidney stone last week. She has bilateral flank pain. And she is concerned she may have more problems with kidney stones. She has also noticed that she is going to bathroom more frequently. She has not noted any blood in the urine recently. She presents now for assistance with these medical issues.  PRIMARY CARE MD - Dr. Phillips Odor.  The history is provided by the patient.    Past Medical History  Diagnosis Date  . Hypertension   . Arthritis   . Depression   . Hyperlipidemia   . PFO (patent foramen ovale)     small  . H/O echocardiogram 2008    prolapse of post. mitral leaflet, tr MR, tr TR, EF >55%, mild concentric LVH  . H/O cardiovascular stress test 10/13/2007    low risk scan, no changes from previous test  . Heart palpitations     controlled on BB  . Hiatal hernia    Past Surgical History  Procedure Laterality Date  .  Cholecystectomy    . Cardiac catheterization  10/2002    normal coronary arteries, normal LV function   Family History  Problem Relation Age of Onset  . Heart disease Mother   . Cancer Father   . Kidney disease Brother   . Hypertension Daughter   . Hyperlipidemia Son    History  Substance Use Topics  . Smoking status: Never Smoker   . Smokeless tobacco: Never Used  . Alcohol Use: No   OB History   Grav Para Term Preterm Abortions TAB SAB Ect Mult Living                 Review of Systems  Constitutional: Negative for activity change.       All ROS Neg except as noted in HPI  HENT: Negative for nosebleeds.   Eyes: Negative for photophobia and discharge.  Respiratory: Negative for cough, shortness of breath and wheezing.   Cardiovascular: Negative for chest pain and palpitations.  Gastrointestinal: Negative for abdominal pain and blood in stool.  Genitourinary: Positive for dysuria and flank pain. Negative for frequency and hematuria.  Musculoskeletal: Positive for arthralgias. Negative for back pain and neck pain.  Skin: Positive for wound.  Neurological: Negative for dizziness, seizures and speech difficulty.  Psychiatric/Behavioral: Negative for hallucinations and confusion.      Allergies  Codeine and Hydrocodone  Home Medications  Prior to Admission medications   Medication Sig Start Date End Date Taking? Authorizing Provider  aspirin 81 MG chewable tablet Chew 81 mg by mouth daily.   Yes Historical Provider, MD  atenolol (TENORMIN) 100 MG tablet Take 1 tablet by mouth daily. 08/26/13  Yes Historical Provider, MD  Biotin 5000 MCG CAPS Take 10,000 mcg by mouth daily.    Yes Historical Provider, MD  Calcium Carbonate-Vitamin D (CALCIUM + D) 600-200 MG-UNIT TABS Take 1 tablet by mouth daily.   Yes Historical Provider, MD  carboxymethylcellulose (REFRESH PLUS) 0.5 % SOLN Place 1 drop into both eyes 3 (three) times daily as needed (Dry Eyes).   Yes Historical Provider,  MD  Coenzyme Q10 (CO Q 10) 10 MG CAPS Take 10 mg by mouth daily.    Yes Historical Provider, MD  Cyanocobalamin (VITAMIN B-12 IJ) Inject as directed every 30 (thirty) days.   Yes Historical Provider, MD  latanoprost (XALATAN) 0.005 % ophthalmic solution Place 1 drop into both eyes at bedtime.   Yes Historical Provider, MD  losartan (COZAAR) 100 MG tablet Take 100 mg by mouth daily.   Yes Historical Provider, MD  Multiple Vitamin (MULTIVITAMIN WITH MINERALS) TABS tablet Take 2 tablets by mouth daily.   Yes Historical Provider, MD  naproxen (NAPROSYN) 500 MG tablet Take 500 mg by mouth daily. For pain   Yes Historical Provider, MD  ranitidine (ZANTAC) 150 MG tablet Take 150 mg by mouth 2 (two) times daily.   Yes Historical Provider, MD  sertraline (ZOLOFT) 100 MG tablet Take 100 mg by mouth daily.  08/11/13  Yes Historical Provider, MD  SIMBRINZA 1-0.2 % SUSP Place 1 drop into both eyes 3 (three) times daily. 06/14/14  Yes Historical Provider, MD  simvastatin (ZOCOR) 10 MG tablet Take 10 mg by mouth at bedtime.   Yes Historical Provider, MD  traMADol (ULTRAM) 50 MG tablet Take 50-100 mg by mouth every 6 (six) hours as needed. For pain   Yes Historical Provider, MD   BP 147/101  Pulse 72  Temp(Src) 98 F (36.7 C) (Oral)  Resp 16  SpO2 99% Physical Exam  Nursing note and vitals reviewed. Constitutional: She is oriented to person, place, and time. She appears well-developed and well-nourished.  Non-toxic appearance.  HENT:  Head: Normocephalic.  Right Ear: Tympanic membrane and external ear normal.  Left Ear: Tympanic membrane and external ear normal.  Eyes: EOM and lids are normal. Pupils are equal, round, and reactive to light.  Neck: Normal range of motion. Neck supple. Carotid bruit is not present.  Cardiovascular: Normal rate, regular rhythm, intact distal pulses and normal pulses.   Murmur heard. Pulmonary/Chest: Breath sounds normal. No respiratory distress.  Abdominal: Soft. Bowel  sounds are normal. She exhibits no mass. There is no tenderness. There is no guarding.  Bilat CVAT  No mass or pulsating mass of the abd.  Musculoskeletal: Normal range of motion.  See images below. There is good range of motion with mild to moderate crepitus of the right hip and knee. There is no increased warmth or deformity of the anterior tibial area. There is redness and mild increased warmth of the dorsum of the right foot extending from the great toe to the mid dorsum of the foot. There is evidence of a puncture wound surrounded by pus like material under the skin surrounded by increased redness and warmth just below the metatarsal phalangeal head and extending to the mid foot. There is minimal tenderness of the dorsum or  the plantar surface of the foot. There is minimal drainage, and this is mostly yellow to clear. The dorsalis pedis pulse is 2+.  Lymphadenopathy:       Head (right side): No submandibular adenopathy present.       Head (left side): No submandibular adenopathy present.    She has no cervical adenopathy.  Neurological: She is alert and oriented to person, place, and time. She has normal strength. No cranial nerve deficit or sensory deficit.  Skin: Skin is warm and dry.  Psychiatric: She has a normal mood and affect. Her speech is normal.    ED Course  Procedures (including critical care time) Labs Review Labs Reviewed  URINALYSIS, ROUTINE W REFLEX MICROSCOPIC - Abnormal; Notable for the following:    APPearance HAZY (*)    Leukocytes, UA LARGE (*)    All other components within normal limits  CBC WITH DIFFERENTIAL - Abnormal; Notable for the following:    WBC 12.0 (*)    RBC 3.68 (*)    Hemoglobin 11.4 (*)    HCT 34.0 (*)    Neutrophils Relative % 87 (*)    Neutro Abs 10.3 (*)    Lymphocytes Relative 7 (*)    All other components within normal limits  COMPREHENSIVE METABOLIC PANEL - Abnormal; Notable for the following:    Glucose, Bld 101 (*)    Albumin 3.4  (*)    GFR calc non Af Amer 63 (*)    GFR calc Af Amer 74 (*)    All other components within normal limits  URINE MICROSCOPIC-ADD ON - Abnormal; Notable for the following:    Squamous Epithelial / LPF MANY (*)    Bacteria, UA FEW (*)    All other components within normal limits  LACTIC ACID, PLASMA    Imaging Review Ct Foot Right Wo Contrast  07/18/2014   CLINICAL DATA:  Patient stepped on stone a few days ago later removing a small piece of drop. Now with foot pain and swelling. Questionable residual foreign body.  EXAM: CT OF THE RIGHT FOOT WITHOUT CONTRAST  TECHNIQUE: Multidetector CT imaging was performed according to the standard protocol. Multiplanar CT image reconstructions were also generated.  COMPARISON:  None.  FINDINGS: There is diffuse soft tissue edema over the plantar aspect of the forefoot centered on the first metatarsal head and first metatarsophalangeal joint. There is no radiopaque foreign body. There is no defined fluid collection to suggest an abscess.  No fracture. There is no bone resorption to suggest osteomyelitis. There are arthropathic changes involving multiple interphalangeal joints. Moderate-sized plantar and dorsal calcaneal spurs are noted. Bones are diffusely demineralized.  IMPRESSION: 1. No radiopaque foreign body. 2. No fracture. 3. No evidence of osteomyelitis. 4. Diffuse soft tissue edema/cellulitis centered along the plantar aspect of the forefoot below the first metatarsal head and first metatarsophalangeal joint. No defined abscess.   Electronically Signed   By: Amie Portland M.D.   On: 07/18/2014 10:04     EKG Interpretation None      MDM Pt seen with me by Dr Lynelle Doctor.  Blood pressure is elevated at 147/101, the remainder of the vital signs are well within normal limits. Pulse oximetry is 99% on room air. Within normal limits by my interpretation.  Complete blood count shows an elevation in white blood cells of 12,000. The hemoglobin is slightly low  11.4, hematocrit slightly low at 34. There's no shift to the left. Comprehensive metabolic panel shows the glucose to be elevated  at 101, albumin low at 3.4, otherwise well within normal limits. Urine analysis shows a hazy L. specimen with a specific gravity 1.0.0. There is large leukocyte esterase present. The white blood cells are elevated at 21-50, the white blood cells are 0-2, and they're few bacteria present with many squamous cells.  CT scan of the right foot is negative for any foreign body, negative for fracture, negative for osteomyelitis. There is diffuse soft tissue edema cellulitis noted mostly at the plantar aspect of the forefoot between the first metatarsal head and the metatarsal phalangeal joint. There was no abscess noted.  Patient started on vancomycin. Patient discussed with [facility]. Pt to be admitted to Med/Surg room.    Final diagnoses:  None    **I have reviewed nursing notes, vital signs, and all appropriate lab and imaging results for this patient.Kathie Dike, PA-C 07/18/14 1102

## 2014-07-18 NOTE — ED Notes (Signed)
Pt complain of bilateral flank pain. States she passed a kidney stone last week. Also, states she stepped on a black rock about two weeks ago. Right great toe and ball of foot is red, swollen with yellow exudate

## 2014-07-18 NOTE — ED Provider Notes (Signed)
Patient reports she had a rock in her shoes that she ignore about 8 or 9 days ago. She states she did pull a small rock out of the bottom of her right foot. She reports since then she's been getting progressively more uncomfortable. Since last night she has developed redness and swelling of her foot. She describes undocumented fever and has been having chills.  Patient's noted to have moderate redness and swelling of her right great toe, metatarsals of the medial toes, the dorsum of the foot to the ankle. There is a laceration on the sole of her right foot under a bunion.  She has good equal pulses. She appears to have a collection of pus under the skin where she removed the rock. There isn't any femoral lymphadenopathy present yet.       Pt presents with abscess/cellulitis of her Right food after removal of a foreign body.    Medical screening examination/treatment/procedure(s) were conducted as a shared visit with non-physician practitioner(s) and myself.  I personally evaluated the patient during the encounter.   EKG Interpretation None       Devoria AlbeIva Neyla Gauntt, MD, Armando GangFACEP   Ward GivensIva L Akiya Morr, MD 07/18/14 1031

## 2014-07-18 NOTE — ED Provider Notes (Signed)
See prior note   Ward GivensIva L Ivanna Kocak, MD 07/18/14 1312

## 2014-07-18 NOTE — ED Notes (Addendum)
Pt c/o right flank pain, increase in urination for the past two weeks or so, has hx of kidney stones and states that she passed one two weeks ago, pt also stepped on rock with right foot a week ago, right ball of joint is red, swollen, yellow. Pt states that she did get a "black rock " out of her foot after stepping on the rock a few days later. Cms intact distal, pt also c/o generalized body aches, states "i felt like i had the flu",

## 2014-07-18 NOTE — ED Notes (Signed)
Hobson PA at bedside,  

## 2014-07-19 DIAGNOSIS — N390 Urinary tract infection, site not specified: Secondary | ICD-10-CM

## 2014-07-19 LAB — URINE CULTURE: Colony Count: 15000

## 2014-07-19 LAB — BASIC METABOLIC PANEL
ANION GAP: 11 (ref 5–15)
BUN: 15 mg/dL (ref 6–23)
CALCIUM: 8.9 mg/dL (ref 8.4–10.5)
CO2: 26 meq/L (ref 19–32)
Chloride: 109 mEq/L (ref 96–112)
Creatinine, Ser: 0.93 mg/dL (ref 0.50–1.10)
GFR calc Af Amer: 66 mL/min — ABNORMAL LOW (ref >90)
GFR calc non Af Amer: 57 mL/min — ABNORMAL LOW (ref >90)
Glucose, Bld: 110 mg/dL — ABNORMAL HIGH (ref 70–99)
Potassium: 4.3 mEq/L (ref 3.7–5.3)
Sodium: 146 mEq/L (ref 137–147)

## 2014-07-19 LAB — CBC
HCT: 32.5 % — ABNORMAL LOW (ref 36.0–46.0)
HEMOGLOBIN: 10.6 g/dL — AB (ref 12.0–15.0)
MCH: 30.6 pg (ref 26.0–34.0)
MCHC: 32.6 g/dL (ref 30.0–36.0)
MCV: 93.9 fL (ref 78.0–100.0)
Platelets: 194 10*3/uL (ref 150–400)
RBC: 3.46 MIL/uL — ABNORMAL LOW (ref 3.87–5.11)
RDW: 13.1 % (ref 11.5–15.5)
WBC: 7.9 10*3/uL (ref 4.0–10.5)

## 2014-07-19 NOTE — Consult Note (Signed)
Podiatry Consult Note  Reason for Consultation:  Right foot infection  History of Present Illness: Cassandra [name] is a 80 y.o. female who presented to the emergency room on 07/18/2014 complaining of redness, swelling pain foot.  She does recall finding a rock in her shoe about 2 weeks ago around the time the problem first began.  She has noticed some drainage from her right foot.  The drainage is yellow in color.  At the time of her admission, she was complaining of chills and flulike symptoms.  Currently, she feels better but right foot remains tender.  She denies history of diabetes.  Family members are present at bedside including her daughter.   Past Medical History  Diagnosis Date  . Hypertension   . Arthritis   . Depression   . Hyperlipidemia   . PFO (patent foramen ovale)     small  . H/O echocardiogram 2008    prolapse of post. mitral leaflet, tr MR, tr TR, EF >55%, mild concentric LVH  . H/O cardiovascular stress test 10/13/2007    low risk scan, no changes from previous test  . Heart palpitations     controlled on BB  . Hiatal hernia     Scheduled Meds: . aspirin  81 mg Oral Daily  . atenolol  100 mg Oral Daily  . calcium-vitamin D  1 tablet Oral Daily  . enoxaparin (LOVENOX) injection  40 mg Subcutaneous Q24H  . famotidine  20 mg Oral BID  . latanoprost  1 drop Both Eyes QHS  . losartan  100 mg Oral Daily  . naproxen  500 mg Oral Daily  . piperacillin-tazobactam (ZOSYN)  IV  3.375 g Intravenous Q8H  . sertraline  100 mg Oral Daily  . simvastatin  10 mg Oral QHS  . vancomycin  750 mg Intravenous Q12H   Continuous Infusions: . 0.9 % NaCl with KCl 20 mEq / L 75 mL/hr at 07/19/14 0700   PRN Meds:.acetaminophen, acetaminophen, ondansetron (ZOFRAN) IV, ondansetron, traMADol  Allergies  Allergen Reactions  . Codeine Shortness Of Breath and Nausea And Vomiting  . Hydrocodone Shortness Of Breath and Nausea And Vomiting   Past Surgical History  Procedure  Laterality Date  . Cholecystectomy    . Cardiac catheterization  10/2002    normal coronary arteries, normal LV function    Family History  Problem Relation Age of Onset  . Heart disease Mother   . Cancer Father   . Kidney disease Brother   . Hypertension Daughter   . Hyperlipidemia Son     Social History:  reports that she has never smoked. She has never used smokeless tobacco. She reports that she does not drink alcohol or use illicit drugs.  Review of Systems: Constitutional:  [No] fever.  Some chills. GI Symptoms:  [No] nausea.  [No] vomiting. Musculoskeletal:  Some flank pain.  Right foot pain with associated redness and swelling.     Physical Examination: Vital signs in last 24 hours:   Temp:  [98 F (36.7 C)-98.9 F (37.2 C)] 98.9 F (37.2 C) (08/17 1456) Pulse Rate:  [56-63] 56 (08/17 1456) Resp:  [16-18] 18 (08/17 1456) BP: (116-136)/(49-58) 136/49 mmHg (08/17 1456) SpO2:  [96 %-100 %] 100 % (08/17 [id number])  This 80 year old female is alert and in no acute distress.  She is sitting at bedside talking to her family members.  No dressing is in place on the right foot.  There is an open wound along the medial plantar aspect  of the right foot with surrounding erythema extending plantarly and dorsally to the midfoot.  The erythema does appear to have receded from a skin marker placed on her right foot.  An area of fluctuance is noted along the medial aspect of the right first metatarsophalangeal joint.  A focal amount of purulence was expressed.  Dorsalis pedis pulses diminished bilaterally.  Posterior tibial pulses palpable on the left.  I was unable to palpate a posterior tibial pulse on the right foot.  There is edema of the right foot.  The medial aspect of the right foot is tender to light touch.  Calves are soft and nontender.  Light touch sensation is grossly intact.  Lab/Test Results:   Recent Labs  07/18/14 0927 07/19/14 0530  WBC 12.0* 7.9  HGB 11.4* 10.6*  HCT  34.0* 32.5*  PLT 201 194  NA 143 146  K 3.8 4.3  CL 107 109  CO2 24 26  BUN 16 15  CREATININE 0.85 0.93  GLUCOSE 101* 110*  CALCIUM 9.2 8.9    No results found for this or any previous visit (from the past 240 hour(s)).   Ct Foot Right Wo Contrast  07/18/2014   CLINICAL DATA:  Patient stepped on stone a few days ago later removing a small piece of drop. Now with foot pain and swelling. Questionable residual foreign body.  EXAM: CT OF THE RIGHT FOOT WITHOUT CONTRAST  TECHNIQUE: Multidetector CT imaging was performed according to the standard protocol. Multiplanar CT image reconstructions were also generated.  COMPARISON:  None.  FINDINGS: There is diffuse soft tissue edema over the plantar aspect of the forefoot centered on the first metatarsal head and first metatarsophalangeal joint. There is no radiopaque foreign body. There is no defined fluid collection to suggest an abscess.  No fracture. There is no bone resorption to suggest osteomyelitis. There are arthropathic changes involving multiple interphalangeal joints. Moderate-sized plantar and dorsal calcaneal spurs are noted. Bones are diffusely demineralized.  IMPRESSION: 1. No radiopaque foreign body. 2. No fracture. 3. No evidence of osteomyelitis. 4. Diffuse soft tissue edema/cellulitis centered along the plantar aspect of the forefoot below the first metatarsal head and first metatarsophalangeal joint. No defined abscess.   Electronically Signed   By: Amie Portland M.D.   On: 07/18/2014 10:04    Assessment: 1.  Cellulitis, right foot. 2.  Abscess, right foot.  Plan: The podiatric pathology and treatment options were explained to the patient and her family.  Given the clinical presentation, I recommend incision and drainage/debridement of the right foot.  The benefits and risk of this procedure were explained.  Surgery is planned for Wednesday morning.  A noninvasive arterial study has been ordered.  Continue with current antibiotics  per primary team.  Thank you for this consultation and for allowing me to participate in the care of this patient.  Titianna Loomis IVAN 07/19/2014, 8:25 PM

## 2014-07-19 NOTE — Progress Notes (Signed)
Dr Craige CottaKirby paged for abd distention and urine retention; [facility], bladder scan completed with 200ml; patient uncomfortable having to get [facility] the [facility] every 15-30 minutes

## 2014-07-19 NOTE — Progress Notes (Signed)
[facility] PROGRESS NOTE  Cassandra [name] ZOX:[id number] DOB: 09-01-1933 DOA: 07/18/2014 PCP: Colette Ribas, MD  Assessment/Plan: 1. Cellulitis right foot. Initial CT imaging did not indicate any underlying abscess. She is on vancomycin and Zosyn and continues to have significant erythema. Small area of fluid collection is developing on the medial aspect of her great toe. Will continue with current antibiotics. We'll request podiatry evaluation for assistance 2. Hypertension. Will continue outpatient regimen 3. Hyperlipidemia. Continue statin. 4. Possible UTI. She's currently on antibiotics. Followup urine culture  Code Status: full code Family Communication: discussed with patient Disposition Plan: discharge home when improved   Consultants:    Procedures:    Antibiotics:  Vancomycin 8/16  Zosyn 8/16  HPI/Subjective: Feels that foot is improving, still painful   Objective: Filed Vitals:   07/19/14 1456  BP: 136/49  Pulse: 56  Temp: 98.9 F (37.2 C)  Resp: 18    Intake/Output Summary (Last 24 hours) at 07/19/14 1718 Last data filed at 07/19/14 1634  Gross per 24 hour  Intake   1702 ml  Output   1425 ml  Net    277 ml   Filed Weights   07/18/14 1259  Weight: 78.926 kg (174 lb)    Exam:   General:  NAD  Cardiovascular: S1, S2 RRR  Respiratory: CTA B  Abdomen: soft, nt, nd, bs+  Musculoskeletal: right foot erythema appears to be receding from demarcated borders, area of fluid collection on medial aspect of great toe is developing.   Data Reviewed: Basic Metabolic Panel:  Recent Labs Lab 07/18/14 0927 07/19/14 0530  NA 143 146  K 3.8 4.3  CL 107 109  CO2 24 26  GLUCOSE 101* 110*  BUN 16 15  CREATININE 0.85 0.93  CALCIUM 9.2 8.9   Liver Function Tests:  Recent Labs Lab 07/18/14 0927  AST 14  ALT 12  ALKPHOS 79  BILITOT 0.8  PROT 6.5  ALBUMIN 3.4*   No results found for this basename: LIPASE, AMYLASE,  in the last  168 hours No results found for this basename: AMMONIA,  in the last 168 hours CBC:  Recent Labs Lab 07/18/14 0927 07/19/14 0530  WBC 12.0* 7.9  NEUTROABS 10.3*  --   HGB 11.4* 10.6*  HCT 34.0* 32.5*  MCV 92.4 93.9  PLT 201 194   Cardiac Enzymes: No results found for this basename: CKTOTAL, CKMB, CKMBINDEX, TROPONINI,  in the last 168 hours BNP (last 3 results) No results found for this basename: PROBNP,  in the last 8760 hours CBG: No results found for this basename: GLUCAP,  in the last 168 hours  No results found for this or any previous visit (from the past 240 hour(s)).   Studies: Ct Foot Right Wo Contrast  07/18/2014   CLINICAL DATA:  Patient stepped on stone a few days ago later removing a small piece of drop. Now with foot pain and swelling. Questionable residual foreign body.  EXAM: CT OF THE RIGHT FOOT WITHOUT CONTRAST  TECHNIQUE: Multidetector CT imaging was performed according to the standard protocol. Multiplanar CT image reconstructions were also generated.  COMPARISON:  None.  FINDINGS: There is diffuse soft tissue edema over the plantar aspect of the forefoot centered on the first metatarsal head and first metatarsophalangeal joint. There is no radiopaque foreign body. There is no defined fluid collection to suggest an abscess.  No fracture. There is no bone resorption to suggest osteomyelitis. There are arthropathic changes involving multiple interphalangeal joints. Moderate-sized  plantar and dorsal calcaneal spurs are noted. Bones are diffusely demineralized.  IMPRESSION: 1. No radiopaque foreign body. 2. No fracture. 3. No evidence of osteomyelitis. 4. Diffuse soft tissue edema/cellulitis centered along the plantar aspect of the forefoot below the first metatarsal head and first metatarsophalangeal joint. No defined abscess.   Electronically Signed   By: Amie Portlandavid  Ormond M.D.   On: 07/18/2014 10:04    Scheduled Meds: . aspirin  81 mg Oral Daily  . atenolol  100 mg Oral  Daily  . calcium-vitamin D  1 tablet Oral Daily  . enoxaparin (LOVENOX) injection  40 mg Subcutaneous Q24H  . famotidine  20 mg Oral BID  . latanoprost  1 drop Both Eyes QHS  . losartan  100 mg Oral Daily  . naproxen  500 mg Oral Daily  . piperacillin-tazobactam (ZOSYN)  IV  3.375 g Intravenous Q8H  . sertraline  100 mg Oral Daily  . simvastatin  10 mg Oral QHS  . vancomycin  750 mg Intravenous Q12H   Continuous Infusions: . 0.9 % NaCl with KCl 20 mEq / L 75 mL/hr at 07/18/14 1840    Active Problems:   Hypertension   Hyperlipidemia   Cellulitis   Cellulitis of foot, right   UTI (urinary tract infection)    Time spent: 30mins    MEMON,JEHANZEB  [facility] Pager [phone]. If 7PM-7AM, please contact night-coverage at [email or web address], password [email or web address] 07/19/2014, 5:18 PM  LOS: 1 day

## 2014-07-19 NOTE — Care Management Note (Addendum)
    Page 1 of 2   07/23/2014     10:54:45 AM CARE MANAGEMENT NOTE 07/23/2014  Patient:  Cassandra [name],Cassandra [name]   Account Number:  [id number]  Date Initiated:  07/19/2014  Documentation initiated by:  Sharrie RothmanBLACKWELL,Analycia Khokhar C  Subjective/Objective Assessment:   Pt admitted from home with cellulitis. Pt lives with her husband and will return home at discharge. Pt is independent with ADL's.     Action/Plan:   No CM needs noted.   Anticipated DC Date:  07/20/2014   Anticipated DC Plan:  HOME/SELF CARE      DC Planning Services  CM consult      PAC Choice  DURABLE MEDICAL EQUIPMENT  HOME HEALTH   Choice offered to / List presented to:  C-1 Patient   DME arranged  [facility]an [facility]stWALKER - [facility]      DME agency  [facility]     [facility]  [facility]      [facility]   Status of service:  Completed, signed off Medicare Important Message given?  YES (If response is "NO", the following Medicare IM given date fields will be blank) Date Medicare IM given:  07/23/2014 Medicare IM given by:  Sharrie RothmanBLACKWELL,Herchel Hopkin C Date Additional Medicare IM given:   Additional Medicare IM given by:    Discharge Disposition:  HOME W HOME HEALTH SERVICES  Per UR Regulation:    If discussed at Long Length of Stay Meetings, dates discussed:    Comments:  07/23/14 1050 Arlyss Queenammy Arkel Cartwright, RN BSN CM Pt [facility] be [facility] (per pts choice). Alroy BailiffLinda Lothian of [facility] is aware and will collect the pts information from the chart. HH services to start within 48 hours of discharge. Rolling walk[facility] ([facility]). Pt and pts nurse aware of discharge arrangements.  07/19/14 1110 Arlyss Queenammy Evany Schecter, RN BSN CM

## 2014-07-19 NOTE — Progress Notes (Signed)
UR chart review completed.  

## 2014-07-20 ENCOUNTER — Inpatient Hospital Stay (HOSPITAL_COMMUNITY): Payer: Medicare Other

## 2014-07-20 DIAGNOSIS — L0291 Cutaneous abscess, unspecified: Secondary | ICD-10-CM

## 2014-07-20 DIAGNOSIS — L0390 Cellulitis, unspecified: Secondary | ICD-10-CM

## 2014-07-20 LAB — CBC
HCT: 32.9 % — ABNORMAL LOW (ref 36.0–46.0)
Hemoglobin: 10.8 g/dL — ABNORMAL LOW (ref 12.0–15.0)
MCH: 30.7 pg (ref 26.0–34.0)
MCHC: 32.8 g/dL (ref 30.0–36.0)
MCV: 93.5 fL (ref 78.0–100.0)
Platelets: 190 10*3/uL (ref 150–400)
RBC: 3.52 MIL/uL — AB (ref 3.87–5.11)
RDW: 12.9 % (ref 11.5–15.5)
WBC: 6 10*3/uL (ref 4.0–10.5)

## 2014-07-20 LAB — BASIC METABOLIC PANEL
Anion gap: 10 (ref 5–15)
BUN: 13 mg/dL (ref 6–23)
CALCIUM: 9.1 mg/dL (ref 8.4–10.5)
CO2: 25 meq/L (ref 19–32)
CREATININE: 0.99 mg/dL (ref 0.50–1.10)
Chloride: 110 mEq/L (ref 96–112)
GFR calc Af Amer: 61 mL/min — ABNORMAL LOW (ref >90)
GFR, EST NON AFRICAN AMERICAN: 53 mL/min — AB (ref >90)
GLUCOSE: 90 mg/dL (ref 70–99)
Potassium: 4.6 mEq/L (ref 3.7–5.3)
Sodium: 145 mEq/L (ref 137–147)

## 2014-07-20 LAB — VANCOMYCIN, TROUGH: Vancomycin Tr: 18.1 ug/mL (ref 10.0–20.0)

## 2014-07-20 NOTE — Progress Notes (Signed)
07/20/14 1201 Zosyn am dose not given per night shift RN since vancomycin not yet finished per report. Off schedule now due to loss of IV access this morning. Nursing attempting to gain new IV access. Notified pharmacy, stated to give next scheduled dose once new IV access obtained. Earnstine RegalAshley Demyan Fugate, RN

## 2014-07-20 NOTE — Progress Notes (Signed)
[facility] PROGRESS NOTE  Cassandra [name] ZOX:[id number] DOB: [birth year] DOA: 07/18/2014 PCP: Colette Ribas, MD  Assessment/Plan: 1. Cellulitis right foot. Initial CT imaging did not indicate any underlying abscess. She is on vancomycin and Zosyn and continued to have significant erythema. Small area of fluid collection was developing on the medial aspect of her great toe with concerns for developing abscess. Will continue with current antibiotics. Appreciate podiatry assistance.  Plans are for incision and drainage on 8/19. Arterial dopplers show ABIs greater than 1 bilaterally 2. Hypertension. Will continue outpatient regimen 3. Hyperlipidemia. Continue statin. 4. Possible UTI. Urine culture shows insignificant growth. Unlikely active infection.  Code Status: full code Family Communication: discussed with patient Disposition Plan: discharge home when improved   Consultants:  Podiatry  Procedures:    Antibiotics:  Vancomycin 8/16>>  Zosyn 8/16>>  HPI/Subjective: No new complaints.  Objective: Filed Vitals:   07/20/14 1718  BP: 141/54  Pulse: 54  Temp:   Resp:     Intake/Output Summary (Last 24 hours) at 07/20/14 1950 Last data filed at 07/20/14 1508  Gross per 24 hour  Intake    790 ml  Output    750 ml  Net     40 ml   Filed Weights   07/18/14 1259  Weight: 78.926 kg (174 lb)    Exam:   General:  NAD  Cardiovascular: S1, S2 RRR  Respiratory: CTA B  Abdomen: soft, nt, nd, bs+  Musculoskeletal: right foot has been wrapped with gauze and ACE bandage and was not removed  Data Reviewed: Basic Metabolic Panel:  Recent Labs Lab 07/18/14 0927 07/19/14 0530 07/20/14 0539  NA 143 146 145  K 3.8 4.3 4.6  CL 107 109 110  CO2 24 26 25   GLUCOSE 101* 110* 90  BUN 16 15 13   CREATININE 0.85 0.93 0.99  CALCIUM 9.2 8.9 9.1   Liver Function Tests:  Recent Labs Lab 07/18/14 0927  AST 14  ALT 12  ALKPHOS 79  BILITOT 0.8  PROT 6.5   ALBUMIN 3.4*   No results found for this basename: LIPASE, AMYLASE,  in the last 168 hours No results found for this basename: AMMONIA,  in the last 168 hours CBC:  Recent Labs Lab 07/18/14 0927 07/19/14 0530 07/20/14 0539  WBC 12.0* 7.9 6.0  NEUTROABS 10.3*  --   --   HGB 11.4* 10.6* 10.8*  HCT 34.0* 32.5* 32.9*  MCV 92.4 93.9 93.5  PLT 201 194 190   Cardiac Enzymes: No results found for this basename: CKTOTAL, CKMB, CKMBINDEX, TROPONINI,  in the last 168 hours BNP (last 3 results) No results found for this basename: PROBNP,  in the last 8760 hours CBG: No results found for this basename: GLUCAP,  in the last 168 hours  Recent Results (from the past 240 hour(s))  URINE CULTURE     Status: None   Collection Time    07/18/14  9:07 AM      Result Value Ref Range Status   Specimen Description URINE, CLEAN CATCH   Final   Special Requests NONE   Final   Culture  Setup Time     Final   Value: 07/18/2014 21:20     Performed at [facility] Count     Final   Value: 15,000 COLONIES/ML     Performed at [facility]   Culture     Final   Value: Multiple bacterial morphotypes present, none predominant. Suggest  appropriate recollection if clinically indicated.     [facility]   Report Status 07/19/2014 FINAL   Final     Studies: [place] Arterial Seg Multiple  07/20/2014   CLINICAL DATA:  Bilateral lower extremity rest pain and claudication  EXAM: NONINVASIVE PHYSIOLOGIC VASCULAR STUDY OF BILATERAL LOWER EXTREMITIES  TECHNIQUE: Evaluation of both lower extremities was performed at rest, including calculation of ankle-brachial indices, multiple segmental pressure evaluation, segmental Doppler and segmental pulse volume recording.  COMPARISON:  None.  FINDINGS: Right ABI:  1.18  Left ABI:  1.14  Right Lower Extremity: The femoral and posterior tibial waveforms are triphasic. Superficial femoral and popliteal waveforms hemi monophasic  appearance of unknown significance. Dorsalis pedis is monophasic. No pressure gradient  Left Lower Extremity: Left common femoral, femoral, popliteal, and posterior tibial waveforms are triphasic. Dorsalis pedis is monophasic.  IMPRESSION: No significant left lower extremity arterial disease.  There is a triphasic waveform in the right posterior tibial artery but monophasic waveforms in the mid thigh. I suspect that this is artifactual and that there is no significant arterial occlusive disease in the right lower extremity. If there is strong clinical concern for arterial insufficiency, CT angiogram can be performed.   Electronically Signed   By: Maryclare BeanArt  Hoss M.D.   On: 07/20/2014 12:14    Scheduled Meds: . aspirin  81 mg Oral Daily  . atenolol  100 mg Oral Daily  . calcium-vitamin D  1 tablet Oral Daily  . enoxaparin (LOVENOX) injection  40 mg Subcutaneous Q24H  . famotidine  20 mg Oral BID  . latanoprost  1 drop Both Eyes QHS  . losartan  100 mg Oral Daily  . naproxen  500 mg Oral Daily  . piperacillin-tazobactam (ZOSYN)  IV  3.375 g Intravenous Q8H  . sertraline  100 mg Oral Daily  . simvastatin  10 mg Oral QHS  . vancomycin  750 mg Intravenous Q12H   Continuous Infusions: . 0.9 % NaCl with KCl 20 mEq / L 75 mL/hr at 07/19/14 2325    Active Problems:   Hypertension   Hyperlipidemia   Cellulitis   Cellulitis of foot, right   UTI (urinary tract infection)    Time spent: 30mins    Marcus Groll  [facility] Pager [phone]. If 7PM-7AM, please contact night-coverage at [email or web address], password [email or web address] 07/20/2014, 7:50 PM  LOS: 2 days

## 2014-07-20 NOTE — Progress Notes (Signed)
Podiatry Progress Note  Subjective: Cassandra [name] is a 80 y.o. female is being followed for cellulitis of the right foot.  She denies any nausea, vomiting, fever or chills.  Past Medical History  Diagnosis Date  . Hypertension   . Arthritis   . Depression   . Hyperlipidemia   . PFO (patent foramen ovale)     small  . H/O echocardiogram 2008    prolapse of post. mitral leaflet, tr MR, tr TR, EF >55%, mild concentric LVH  . H/O cardiovascular stress test 10/13/2007    low risk scan, no changes from previous test  . Heart palpitations     controlled on BB  . Hiatal hernia     Scheduled Meds: . aspirin  81 mg Oral Daily  . atenolol  100 mg Oral Daily  . calcium-vitamin D  1 tablet Oral Daily  . enoxaparin (LOVENOX) injection  40 mg Subcutaneous Q24H  . famotidine  20 mg Oral BID  . latanoprost  1 drop Both Eyes QHS  . losartan  100 mg Oral Daily  . naproxen  500 mg Oral Daily  . piperacillin-tazobactam (ZOSYN)  IV  3.375 g Intravenous Q8H  . sertraline  100 mg Oral Daily  . simvastatin  10 mg Oral QHS  . vancomycin  750 mg Intravenous Q12H   Continuous Infusions: . 0.9 % NaCl with KCl 20 mEq / L 75 mL/hr at 07/19/14 2325   PRN Meds:.acetaminophen, acetaminophen, ondansetron (ZOFRAN) IV, ondansetron, traMADol  Allergies  Allergen Reactions  . Codeine Shortness Of Breath and Nausea And Vomiting  . Hydrocodone Shortness Of Breath and Nausea And Vomiting   Past Surgical History  Procedure Laterality Date  . Cholecystectomy    . Cardiac catheterization  10/2002    normal coronary arteries, normal LV function    Family History  Problem Relation Age of Onset  . Heart disease Mother   . Cancer Father   . Kidney disease Brother   . Hypertension Daughter   . Hyperlipidemia Son     Social History:  reports that she has never smoked. She has never used smokeless tobacco. She reports that she does not drink alcohol or use illicit drugs.  Physical  Examination: Vital signs in last 24 hours:   Temp:  [98 F (36.7 C)-98.9 F (37.2 C)] 98 F (36.7 C) (08/18 0500) Pulse Rate:  [50-56] 50 (08/18 0500) Resp:  [18-20] 18 (08/18 0500) BP: (136-159)/(49-58) 140/56 mmHg (08/18 0500) SpO2:  [98 %-100 %] 98 % (08/18 72050760)  This 80 year old female is alert and in no acute distress.  A dressing is in place on the right foot.  There is an open wound along the medial plantar aspect of the right foot with surrounding erythema extending plantarly and dorsally to the midfoot.  The erythema does appear to have receded from a skin marker placed on her right foot.  An area of fluctuance is noted along the medial aspect of the right first metatarsophalangeal joint.  Dorsalis pedis pulses diminished bilaterally.  Posterior tibial pulses palpable on the left.  I was unable to palpate a posterior tibial pulse on the right foot.  There is edema of the right foot.  The medial aspect of the right foot is tender to light touch.  Calves are soft and nontender.  Light touch sensation is grossly intact.  Lab/Test Results:    Recent Labs  07/19/14 0530 07/20/14 0539  WBC 7.9 6.0  HGB 10.6* 10.8*  HCT 32.5*  32.9*  PLT 194 190  NA 146 145  K 4.3 4.6  CL 109 110  CO2 26 25  BUN 15 13  CREATININE 0.93 0.99  GLUCOSE 110* 90  CALCIUM 8.9 9.1    Recent Results (from the past 240 hour(s))  URINE CULTURE     Status: None   Collection Time    07/18/14  9:07 AM      Result Value Ref Range Status   Specimen Description URINE, CLEAN CATCH   Final   Special Requests NONE   Final   Culture  Setup Time     Final   Value: 07/18/2014 21:20     Performed at [facility] Count     Final   Value: 15,000 COLONIES/ML     Performed at [facility]   Culture     Final   Value: Multiple bacterial morphotypes present, none predominant. Suggest appropriate recollection if clinically indicated.     Performed at [facility]   Report Status  07/19/2014 FINAL   Final     US Arterial Seg Multiple  07/20/2014   CLINICAL DATA:  Bilateral lower extremity rest pain and claudication  EXAM: NONINVASIVE PHYSIOLOGIC VASCULAR STUDY OF BILATERAL LOWER EXTREMITIES  TECHNIQUE: Evaluation of both lower extremities was performed at rest, including calculation of ankle-brachial indices, multiple segmental pressure evaluation, segmental Doppler and segmental pulse volume recording.  COMPARISON:  None.  FINDINGS: Right ABI:  1.18  Left ABI:  1.14  Right Lower Extremity: The femoral and posterior tibial waveforms are triphasic. Superficial femoral and popliteal waveforms hemi monophasic appearance of unknown significance. Dorsalis pedis is monophasic. No pressure gradient  Left Lower Extremity: Left common femoral, femoral, popliteal, and posterior tibial waveforms are triphasic. Dorsalis pedis is monophasic.  IMPRESSION: No significant left lower extremity arterial disease.  There is a triphasic waveform in the right posterior tibial artery but monophasic waveforms in the mid thigh. I suspect that this is artifactual and that there is no significant arterial occlusive disease in the right lower extremity. If there is strong clinical concern for arterial insufficiency, CT angiogram can be performed.   Electronically Signed   By: Maryclare Bean M.D.   On: 07/20/2014 12:14    Assessment: 1.  Cellulitis, right foot. 2.  Abscess, right foot.  Plan: A dressing was reapplied to the right foot.  Incision and drainage of the right foot is scheduled for 7:30 AM tomorrow.  The benefits and risks were explained to the patient.  A consent will be obtained.  She is NPO after midnight.    Lawrie Tunks IVAN 07/20/2014, 1:00 PM

## 2014-07-21 ENCOUNTER — Inpatient Hospital Stay (HOSPITAL_COMMUNITY): Payer: Medicare Other | Admitting: Anesthesiology

## 2014-07-21 ENCOUNTER — Encounter (HOSPITAL_COMMUNITY): Payer: Medicare Other | Admitting: Anesthesiology

## 2014-07-21 ENCOUNTER — Encounter (HOSPITAL_COMMUNITY)
Admission: EM | Disposition: A | Discharge status: Home Health | Payer: Self-pay | Source: Home / Self Care | Attending: Internal Medicine

## 2014-07-21 ENCOUNTER — Encounter (HOSPITAL_COMMUNITY): Payer: Self-pay | Admitting: *Deleted

## 2014-07-21 HISTORY — PX: INCISION AND DRAINAGE: SHX5863

## 2014-07-21 LAB — BASIC METABOLIC PANEL
Anion gap: 8 (ref 5–15)
BUN: 11 mg/dL (ref 6–23)
CALCIUM: 9.1 mg/dL (ref 8.4–10.5)
CHLORIDE: 110 meq/L (ref 96–112)
CO2: 28 meq/L (ref 19–32)
CREATININE: 1.06 mg/dL (ref 0.50–1.10)
GFR calc Af Amer: 56 mL/min — ABNORMAL LOW (ref >90)
GFR calc non Af Amer: 49 mL/min — ABNORMAL LOW (ref >90)
Glucose, Bld: 98 mg/dL (ref 70–99)
Potassium: 4.7 mEq/L (ref 3.7–5.3)
Sodium: 146 mEq/L (ref 137–147)

## 2014-07-21 LAB — CBC
HEMATOCRIT: 33.3 % — AB (ref 36.0–46.0)
HEMOGLOBIN: 10.8 g/dL — AB (ref 12.0–15.0)
MCH: 30.2 pg (ref 26.0–34.0)
MCHC: 32.4 g/dL (ref 30.0–36.0)
MCV: 93 fL (ref 78.0–100.0)
Platelets: 238 10*3/uL (ref 150–400)
RBC: 3.58 MIL/uL — ABNORMAL LOW (ref 3.87–5.11)
RDW: 12.7 % (ref 11.5–15.5)
WBC: 5.6 10*3/uL (ref 4.0–10.5)

## 2014-07-21 LAB — SURGICAL PCR SCREEN
MRSA, PCR: NEGATIVE
Staphylococcus aureus: NEGATIVE

## 2014-07-21 SURGERY — INCISION AND DRAINAGE
Anesthesia: Monitor Anesthesia Care | Site: Foot | Laterality: Right

## 2014-07-21 MED ORDER — ONDANSETRON HCL 4 MG/2ML IJ SOLN: 4.0000 mg | Freq: Once | INTRAMUSCULAR | Status: DC | PRN

## 2014-07-21 MED ORDER — FENTANYL CITRATE 0.05 MG/ML IJ SOLN
25.0000 ug | INTRAMUSCULAR | Status: AC
  Administered 2014-07-21 (×2): 25 ug via INTRAVENOUS

## 2014-07-21 MED ORDER — 0.9 % SODIUM CHLORIDE (POUR BTL) OPTIME
TOPICAL | Status: DC | PRN
  Administered 2014-07-21: 1000 mL

## 2014-07-21 MED ORDER — ONDANSETRON HCL 4 MG/2ML IJ SOLN
4.0000 mg | Freq: Once | INTRAMUSCULAR | Status: AC
  Administered 2014-07-21: 4 mg via INTRAVENOUS

## 2014-07-21 MED ORDER — LIDOCAINE HCL 1 % IJ SOLN
INTRAMUSCULAR | Status: DC | PRN
  Administered 2014-07-21: 10 mg via INTRADERMAL

## 2014-07-21 MED ORDER — FENTANYL CITRATE 0.05 MG/ML IJ SOLN
INTRAMUSCULAR | Status: AC
  Filled 2014-07-21: qty 2

## 2014-07-21 MED ORDER — MIDAZOLAM HCL 2 MG/2ML IJ SOLN
1.0000 mg | INTRAMUSCULAR | Status: DC | PRN
  Administered 2014-07-21: 2 mg via INTRAVENOUS

## 2014-07-21 MED ORDER — PROPOFOL 10 MG/ML IV BOLUS
INTRAVENOUS | Status: DC | PRN
  Administered 2014-07-21: 10 mg via INTRAVENOUS

## 2014-07-21 MED ORDER — BUPIVACAINE HCL (PF) 0.5 % IJ SOLN
INTRAMUSCULAR | Status: AC
  Filled 2014-07-21: qty 30

## 2014-07-21 MED ORDER — ONDANSETRON HCL 4 MG/2ML IJ SOLN
INTRAMUSCULAR | Status: AC
  Filled 2014-07-21: qty 2

## 2014-07-21 MED ORDER — PROPOFOL 10 MG/ML IV BOLUS
INTRAVENOUS | Status: AC
  Filled 2014-07-21: qty 20

## 2014-07-21 MED ORDER — FENTANYL CITRATE 0.05 MG/ML IJ SOLN: 25.0000 ug | INTRAMUSCULAR | Status: DC | PRN

## 2014-07-21 MED ORDER — MIDAZOLAM HCL 2 MG/2ML IJ SOLN
INTRAMUSCULAR | Status: AC
  Filled 2014-07-21: qty 2

## 2014-07-21 MED ORDER — BUPIVACAINE HCL (PF) 0.5 % IJ SOLN
INTRAMUSCULAR | Status: DC | PRN
  Administered 2014-07-21: 20 mL

## 2014-07-21 MED ORDER — LACTATED RINGERS IV SOLN
INTRAVENOUS | Status: DC
  Administered 2014-07-21: 1000 mL via INTRAVENOUS

## 2014-07-21 MED ORDER — VANCOMYCIN HCL 500 MG IV SOLR
500.0000 mg | Freq: Two times a day (BID) | INTRAVENOUS | Status: DC
  Administered 2014-07-21 – 2014-07-23 (×4): 500 mg via INTRAVENOUS
  Filled 2014-07-21 (×10): qty 500

## 2014-07-21 MED ORDER — PROPOFOL INFUSION 10 MG/ML OPTIME
INTRAVENOUS | Status: DC | PRN
  Administered 2014-07-21: 25 ug/kg/min via INTRAVENOUS

## 2014-07-21 SURGICAL SUPPLY — 33 items
BAG HAMPER (MISCELLANEOUS) ×2 IMPLANT
BANDAGE ELASTIC 4 VELCRO NS (GAUZE/BANDAGES/DRESSINGS) ×2 IMPLANT
BLADE SURG 15 STRL LF DISP TIS (BLADE) IMPLANT
BLADE SURG 15 STRL SS (BLADE) ×4
BNDG GAUZE ELAST 4 BULKY (GAUZE/BANDAGES/DRESSINGS) ×2 IMPLANT
CHLORAPREP W/TINT 26ML (MISCELLANEOUS) ×2 IMPLANT
CLOTH BEACON ORANGE TIMEOUT ST (SAFETY) ×2 IMPLANT
COVER LIGHT HANDLE STERIS (MISCELLANEOUS) ×4 IMPLANT
CUFF TOURNIQUET SINGLE 18IN (TOURNIQUET CUFF) ×1 IMPLANT
DECANTER SPIKE VIAL GLASS SM (MISCELLANEOUS) ×2 IMPLANT
DRSG ADAPTIC 3X8 NADH LF (GAUZE/BANDAGES/DRESSINGS) ×2 IMPLANT
ELECT REM PT RETURN 9FT ADLT (ELECTROSURGICAL) ×2
ELECTRODE REM PT RTRN 9FT ADLT (ELECTROSURGICAL) ×1 IMPLANT
GAUZE SPONGE 4X4 12PLY STRL (GAUZE/BANDAGES/DRESSINGS) ×2 IMPLANT
GLOVE BIO SURGEON STRL SZ7.5 (GLOVE) ×2 IMPLANT
GLOVE BIOGEL PI IND STRL 7.0 (GLOVE) IMPLANT
GLOVE BIOGEL PI INDICATOR 7.0 (GLOVE) ×3
GLOVE ECLIPSE 6.5 STRL STRAW (GLOVE) ×1 IMPLANT
GLOVE EXAM NITRILE LRG STRL (GLOVE) ×1 IMPLANT
GLOVE OPTIFIT SS 6.5 STRL BRWN (GLOVE) ×1 IMPLANT
GOWN STRL REUS W/TWL LRG LVL3 (GOWN DISPOSABLE) ×6 IMPLANT
KIT ROOM TURNOVER APOR (KITS) ×2 IMPLANT
MARKER SKIN DUAL TIP RULER LAB (MISCELLANEOUS) ×2 IMPLANT
NDL HYPO 27GX1-1/4 (NEEDLE) ×1 IMPLANT
NEEDLE HYPO 27GX1-1/4 (NEEDLE) ×4 IMPLANT
NS IRRIG 1000ML POUR BTL (IV SOLUTION) ×2 IMPLANT
PACK BASIC LIMB (CUSTOM PROCEDURE TRAY) ×2 IMPLANT
PAD ARMBOARD 7.5X6 YLW CONV (MISCELLANEOUS) ×2 IMPLANT
SET BASIN LINEN APH (SET/KITS/TRAYS/PACK) ×2 IMPLANT
SPONGE LAP 18X18 X RAY DECT (DISPOSABLE) ×1 IMPLANT
SYRINGE CONTROL L 12CC (SYRINGE) ×4 IMPLANT
SYRINGE CONTROL LL 12CC (SYRINGE) ×2 IMPLANT
TOWEL OR 17X26 4PK STRL BLUE (TOWEL DISPOSABLE) ×1 IMPLANT

## 2014-07-21 NOTE — Anesthesia Preprocedure Evaluation (Signed)
Anesthesia Evaluation  Patient identified by MRN, date of birth, ID band Patient awake    Reviewed: Allergy & Precautions, H&P , NPO status , Patient's Chart, lab work & pertinent test results, reviewed documented beta blocker date and time   Airway Mallampati: I TM Distance: >3 FB     Dental  (+) Edentulous Upper, Edentulous Lower   Pulmonary  breath sounds clear to auscultation        Cardiovascular hypertension, Pt. on medications and Pt. on home beta blockers + Valvular Problems/Murmurs (hx patent foramen ovale) MVP Rhythm:Regular Rate:Normal     Neuro/Psych PSYCHIATRIC DISORDERS Depression    GI/Hepatic hiatal hernia,   Endo/Other    Renal/GU      Musculoskeletal   Abdominal   Peds  Hematology   Anesthesia Other Findings   Reproductive/Obstetrics                           Anesthesia Physical Anesthesia Plan  ASA: III  Anesthesia Plan: MAC   Post-op Pain Management:    Induction: Intravenous  Airway Management Planned: Simple Face Mask  Additional Equipment:   Intra-op Plan:   Post-operative Plan:   Informed Consent: I have reviewed the patients History and Physical, chart, labs and discussed the procedure including the risks, benefits and alternatives for the proposed anesthesia with the patient or authorized representative who has indicated his/her understanding and acceptance.     Plan Discussed with:   Anesthesia Plan Comments:         Anesthesia Quick Evaluation

## 2014-07-21 NOTE — Progress Notes (Signed)
UR chart review completed.  

## 2014-07-21 NOTE — Op Note (Signed)
OPERATIVE NOTE  DATE OF PROCEDURE:  07/21/2014  SURGEON:   Dallas SchimkeBenjamin Ivan Dasan Hardman, DPM  OR STAFF:   Circulator: Larwance RoteHolly Renee Protzek, RN Relief Scrub: Nicki Reaperynthia S Wrenn, RN Scrub Person: Hurshel PartyAngela Maria Witt, CST   PREOPERATIVE DIAGNOSIS:   1.  Abscess, right foot (ICD-9 682.7) 2.  Cellulitis, right foot (ICD-9 682.7) 3.  Pain, right foot (ICD-9 729.5)  POSTOPERATIVE DIAGNOSIS: 1.  Abscess, right foot (ICD-9 682.7) 2.  Cellulitis, right foot (ICD-9 682.7) 3.  Ulceration, right foot (ICD-9 707.15) 4.  Pain, right foot (ICD-9 729.5)  PROCEDURE: Incision and drainage of abscess, right foot (CPT 20005)  ANESTHESIA:  Monitor Anesthesia Care   HEMOSTASIS:   Pneumatic ankle tourniquet set at 250 mmHg  ESTIMATED BLOOD LOSS:   Minimal (<5 cc)  MATERIALS USED:  Iodoform 1/2 inch packing  INJECTABLES: 0.5% Marcaine plain  PATHOLOGY:   1.  Aerobic culture 2.  Anaerobic culture  COMPLICATIONS:   None  DESCRIPTION OF THE PROCEDURE:   The patient was brought to the operating room and placed on the operative table in the supine position.  A pneumatic ankle tourniquet was applied to the patient's ankle.  Following sedation, the surgical site was anesthetized with 0.5% Marcaine plain.  The foot was then prepped, scrubbed, and draped in the usual sterile technique.  The foot was elevated and the pneumatic ankle tourniquet inflated to 250 mmHg.    Attention was directed to the plantar aspect of the first metatarsal head.  A hemorrhagic hyperkeratotic lesion was identified.  Following excisional debridement using a #15 blade a full-thickness ulceration was encountered.  The wound bed was comprised of fibrotic tissue.  The fibrotic tissue was debrided using a #15 blade.  The ulceration tracked medially and dorsally for the medial aspect of the first metatarsal head.  A pocket of purulence was encountered and expressed.  Attention was directed to the medial aspect of the first  metatarsophalangeal joint of the right foot.  A bulla was noted along the medial aspect of the first metatarsal head.  The bulla was deroofed.  3 distinct full-thickness ulcerations were identified deep to the blood.  There was undermining of the skin bridge connecting the ulcerations.  The skin appeared nonviable and was debrided using a #15 blade down to and including subcutaneous tissue.  Significant nonviable and fibrous tissue was debrided from the wound bed.  The inferior margin tunneled plantarly and was continuous with the plantar ulceration.  Aerobic and anaerobic cultures were obtained.  The wounds were irrigated with copious amounts of sterile irrigant.  The wound was packed with 1/2 inch iodoform gauze.  A sterile compressive dressing was applied to the operative foot.  The pneumatic ankle tourniquet was deflated and a prompt hyperemic response was noted to all digits of the operative foot.   The patient tolerated the procedure well.  The patient was then transferred to PACU with vital signs stable and vascular status intact to all toes of the operative foot.  Following a period of postoperative monitoring, the patient will be transferred to her room.

## 2014-07-21 NOTE — Progress Notes (Signed)
ANTIBIOTIC CONSULT NOTE - follow up  Pharmacy Consult for Vancomycin and Zosyn Indication: cellulitis  Allergies  Allergen Reactions  . Codeine Shortness Of Breath and Nausea And Vomiting  . Hydrocodone Shortness Of Breath and Nausea And Vomiting   Patient Measurements: Height: 5\' 8"  (172.7 cm) Weight: 174 lb (78.926 kg) IBW/kg (Calculated) : 63.9  Vital Signs: Temp: 97.4 F (36.3 C) (08/19 0944) Temp src: Oral (08/19 0944) BP: 144/53 mmHg (08/19 0944) Pulse Rate: 46 (08/19 0944) Intake/Output from previous day: 08/18 0701 - 08/19 0700 In: 480 [P.O.:480] Out: 2350 [Urine:2350] Intake/Output from this shift: Total I/O In: 600 [I.V.:600] Out: 610 [Urine:600; Blood:10]  Labs:  Recent Labs  07/19/14 0530 07/20/14 0539 07/21/14 0555  WBC 7.9 6.0 5.6  HGB 10.6* 10.8* 10.8*  PLT 194 190 238  CREATININE 0.93 0.99 1.06   Estimated Creatinine Clearance: 47.5 ml/min (by C-G formula based on Cr of 1.06).  Recent Labs  07/20/14 1642  VANCOTROUGH 18.1    Microbiology: Recent Results (from the past 720 hour(s))  URINE CULTURE     Status: None   Collection Time    07/18/14  9:07 AM      Result Value Ref Range Status   Specimen Description URINE, CLEAN CATCH   Final   Special Requests NONE   Final   Culture  Setup Time     Final   Value: 07/18/2014 21:20     Performed at [facility] Count     Final   Value: 15,000 COLONIES/ML     Performed at [facility]   Culture     Final   Value: Multiple bacterial morphotypes present, none predominant. Suggest appropriate recollection if clinically indicated.     Performed at [facility]   Report Status 07/19/2014 FINAL   Final  SURGICAL PCR SCREEN     Status: None   Collection Time    07/20/14 11:55 PM      Result Value Ref Range Status   MRSA, PCR NEGATIVE  NEGATIVE Final   Staphylococcus aureus NEGATIVE  NEGATIVE Final   Comment:            The Xpert SA Assay (FDA     approved for  NASAL specimens     in patients over 36 years of age),     is one component of     a comprehensive surveillance     program.  Test performance has     been validated by [employer] for patients greater     than or equal to 20 year old.     It is not intended     to diagnose infection nor to     guide or monitor treatment.   Medical History: Past Medical History  Diagnosis Date  . Hypertension   . Arthritis   . Depression   . Hyperlipidemia   . PFO (patent foramen ovale)     small  . H/O echocardiogram 2008    prolapse of post. mitral leaflet, tr MR, tr TR, EF >55%, mild concentric LVH  . H/O cardiovascular stress test 10/13/2007    low risk scan, no changes from previous test  . Heart palpitations     controlled on BB  . Hiatal hernia    Vancomycin 8/16>> Zosyn 8/16>>  Assessment: 80yo female admitted with cellulitis of foot.  Renal fxn is stable.   Estimated Creatinine Clearance: 47.5 ml/min (by C-G formula based  on Cr of 1.06).   Trough level is slightly above goal for cellulitis diagnosis.    Goal of Therapy:  Vancomycin trough level 10-15 mcg/ml Eradicate infection.  Plan:  Reduce Vancomycin to 500mg  IV q12hrs Zosyn 3.375gm IV q8h, each dose over 4 hrs Check Vancomycin trough level weekly or sooner if indicated Monitor labs, renal fxn, and cultures  Valrie HartHall, Josephina Melcher A 07/21/2014,9:59 AM

## 2014-07-21 NOTE — Progress Notes (Signed)
  PROGRESS NOTE  Cassandra [name] YNW:[id number] DOB: 07/21/1933 DOA: 07/18/2014 PCP: Colette RibasGOLDING, JOHJudieth Keens CABOT, MD  Summary: 80 year old woman who found a small rock in her shoe which became embedded in her foot. This was subsequently removed. She developed increased swelling and erythema. She was admitted for cellulitis of the right foot.  Assessment/Plan: 1. Cellulitis right foot. Status post incision and drainage 8/19. CT negative for abscess. 2. Hypertension. Stable.   Overall stable. Continue antibiotics, followup wound care per podiatry. Hopefully in the next 48 hours on oral antibiotics.  Code Status: full code DVT prophylaxis: Lovenox Family Communication: none present Disposition Plan: home   Brendia Sacksaniel Cecila Satcher, MD  [facility]  Pager [phone] If 7PM-7AM, please contact night-coverage at [email or web address], password [email or web address] 07/21/2014, 3:59 PM  LOS: 3 days   Consultants:  Podiatry  Procedures: 8/19 Incision and drainage of abscess, right foot (CPT 20005)   Antibiotics:  Vancomycin 8/16  >>  Zosyn 8/16 >>    HPI/Subjective: Status post incision and drainage today. Feels much better today.  Objective: Filed Vitals:   07/21/14 1020 07/21/14 1050 07/21/14 1150 07/21/14 1250  BP: 154/60 105/58 118/67 152/53  Pulse: 46 48 50 52  Temp: 97.6 F (36.4 C)   97.5 F (36.4 C)  TempSrc: Oral   Oral  Resp: 18 18 20 20   Height:      Weight:      SpO2: 100% 98% 99% 98%    Intake/Output Summary (Last 24 hours) at 07/21/14 1559 Last data filed at 07/21/14 1300  Gross per 24 hour  Intake   1080 ml  Output   2360 ml  Net  -1280 ml     Filed Weights   07/18/14 1259  Weight: 78.926 kg (174 lb)    Exam:     Afebrile, vital signs stable. No hypoxia. Gen. Appears calm and comfortable.  Psych. Alert. Speech fluent and clear.  Cardiovascular. Regular rate and rhythm. No murmur, rub or gallop.  Respiratory. Clear to auscultation bilaterally. No wheezes, rales or  rhonchi. Normal respiratory effort.  Data Reviewed:  Chemistry: Basic metabolic panel unremarkable  Heme: CBC stable, no leukocytosis. Hemoglobin stable 10.8.  ID: Foot cultures pending  Scheduled Meds: . aspirin  81 mg Oral Daily  . atenolol  100 mg Oral Daily  . calcium-vitamin D  1 tablet Oral Daily  . enoxaparin (LOVENOX) injection  40 mg Subcutaneous Q24H  . famotidine  20 mg Oral BID  . latanoprost  1 drop Both Eyes QHS  . losartan  100 mg Oral Daily  . piperacillin-tazobactam (ZOSYN)  IV  3.375 g Intravenous Q8H  . sertraline  100 mg Oral Daily  . simvastatin  10 mg Oral QHS  . vancomycin  500 mg Intravenous Q12H   Continuous Infusions: . 0.9 % NaCl with KCl 20 mEq / L 75 mL/hr at 07/21/14 1106    Principal Problem:   Cellulitis of foot, right Active Problems:   Hypertension   Hyperlipidemia   Time spent 20 minutes

## 2014-07-21 NOTE — Transfer of Care (Signed)
Immediate Anesthesia Transfer of Care Note  Patient: Cassandra [name]  Procedure(s) Performed: Procedure(s): INCISION AND DRAINAGE (Right)  Patient Location: PACU  Anesthesia Type:MAC  Level of Consciousness: awake  Airway & Oxygen Therapy: Patient Spontanous Breathing and Patient connected to face mask oxygen  Post-op Assessment: Report given to PACU RN  Post vital signs: Reviewed and stable  Complications: No apparent anesthesia complications

## 2014-07-21 NOTE — Anesthesia Postprocedure Evaluation (Signed)
  Anesthesia Post-op Note  Patient: Cassandra KeensBetty R [name]  Procedure(s) Performed: Procedure(s): INCISION AND DRAINAGE (Right)  Patient Location: PACU  Anesthesia Type:MAC  Level of Consciousness: awake, alert  and oriented  Airway and Oxygen Therapy: Patient Spontanous Breathing and Patient connected to face mask oxygen  Post-op Pain: none  Post-op Assessment: Post-op Vital signs reviewed, Patient's Cardiovascular Status Stable, Respiratory Function Stable, Patent Airway and No signs of Nausea or vomiting  Post-op Vital Signs: Reviewed and stable  Last Vitals:  Filed Vitals:   07/21/14 0715  BP: 153/70  Pulse:   Temp:   Resp: 21    Complications: No apparent anesthesia complications

## 2014-07-22 ENCOUNTER — Encounter (HOSPITAL_COMMUNITY): Payer: Self-pay | Admitting: Podiatry

## 2014-07-22 MED ORDER — POVIDONE-IODINE 10 % EX SOLN
CUTANEOUS | Status: AC
  Administered 2014-07-22: 12:00:00
  Filled 2014-07-22: qty 118

## 2014-07-22 NOTE — Addendum Note (Signed)
Addendum created 07/22/14 0755 by Earleen NewportAmy A Adams, CRNA   Modules edited: Notes Section   Notes Section:  File: [id number]

## 2014-07-22 NOTE — Anesthesia Postprocedure Evaluation (Signed)
  Anesthesia Post-op Note  Patient: Cassandra KeensBetty R Stille  Procedure(s) Performed: Procedure(s): INCISION AND DRAINAGE (Right)  Patient Location: Room 311  Anesthesia Type:MAC  Level of Consciousness: awake, alert , oriented and patient cooperative  Airway and Oxygen Therapy: Patient Spontanous Breathing  Post-op Pain: mild  Post-op Assessment: Post-op Vital signs reviewed, Patient's Cardiovascular Status Stable, Respiratory Function Stable, Patent Airway and Pain level controlled  Post-op Vital Signs: Reviewed and stable  Last Vitals:  Filed Vitals:   07/22/14 0540  BP: 141/58  Pulse: 50  Temp: 36.8 C  Resp: 20    Complications: No apparent anesthesia complications

## 2014-07-22 NOTE — Progress Notes (Signed)
Podiatry Progress Note  Subjective: Cassandra [name] is a 80 y.o. female POD#1 following incision and drainage of the right foot.  Her pain is well controlled.  She denies any nausea, vomiting, fever or chills.  Her daughter is present for today's visit.  Past Medical History  Diagnosis Date  . Hypertension   . Arthritis   . Depression   . Hyperlipidemia   . PFO (patent foramen ovale)     small  . H/O echocardiogram 2008    prolapse of post. mitral leaflet, tr MR, tr TR, EF >55%, mild concentric LVH  . H/O cardiovascular stress test 10/13/2007    low risk scan, no changes from previous test  . Heart palpitations     controlled on BB  . Hiatal hernia     Scheduled Meds: . aspirin  81 mg Oral Daily  . atenolol  100 mg Oral Daily  . calcium-vitamin D  1 tablet Oral Daily  . enoxaparin (LOVENOX) injection  40 mg Subcutaneous Q24H  . famotidine  20 mg Oral BID  . latanoprost  1 drop Both Eyes QHS  . losartan  100 mg Oral Daily  . piperacillin-tazobactam (ZOSYN)  IV  3.375 g Intravenous Q8H  . sertraline  100 mg Oral Daily  . simvastatin  10 mg Oral QHS  . vancomycin  500 mg Intravenous Q12H   Continuous Infusions:   PRN Meds:.acetaminophen, acetaminophen, ondansetron (ZOFRAN) IV, ondansetron, traMADol  Allergies  Allergen Reactions  . Codeine Shortness Of Breath and Nausea And Vomiting  . Hydrocodone Shortness Of Breath and Nausea And Vomiting   Past Surgical History  Procedure Laterality Date  . Cholecystectomy    . Cardiac catheterization  10/2002    normal coronary arteries, normal LV function  . Incision and drainage Right 07/21/2014    Procedure: INCISION AND DRAINAGE;  Surgeon: Dallas SchimkeBenjamin Ivan Christianjames Soule, DPM;  Location: [facility];  Service: Podiatry;  Laterality: Right;    Family History  Problem Relation Age of Onset  . Heart disease Mother   . Cancer Father   . Kidney disease Brother   . Hypertension Daughter   . Hyperlipidemia Son     Social History:  reports that she has never smoked. She has never used smokeless tobacco. She reports that she does not drink alcohol or use illicit drugs.  Physical Examination: Vital signs in last 24 hours:   Temp:  [98.3 F (36.8 C)-98.7 F (37.1 C)] 98.3 F (36.8 C) (08/20 1500) Pulse Rate:  [50-62] 52 (08/20 1500) Resp:  [20] 20 (08/20 1500) BP: (122-141)/(55-58) 122/55 mmHg (08/20 1500) SpO2:  [92 %-98 %] 92 % (08/20 1500)  Surgical dressing is clean, dry and intact without strikethrough.  Erythema is improved.  Wound bed is comprised of fibrotic and granular tissue.  Pedal pulses are palpable.  Calves are soft and nontender.  Lab/Test Results:    Recent Labs  07/20/14 0539 07/21/14 0555  WBC 6.0 5.6  HGB 10.8* 10.8*  HCT 32.9* 33.3*  PLT 190 238  NA 145 146  K 4.6 4.7  CL 110 110  CO2 25 28  BUN 13 11  CREATININE 0.99 1.06  GLUCOSE 90 98  CALCIUM 9.1 9.1    Recent Results (from the past 240 hour(s))  URINE CULTURE     Status: None   Collection Time    07/18/14  9:07 AM      Result Value Ref Range Status   Specimen Description URINE, CLEAN CATCH   Final  Special Requests NONE   Final   Culture  Setup Time     Final   Value: 07/18/2014 21:20     Performed at [facility] Count     Final   Value: 15,000 COLONIES/ML     Performed at [facility]   Culture     Final   Value: Multiple bacterial morphotypes present, none predominant. Suggest appropriate recollection if clinically indicated.     Performed at [facility]   Report Status 07/19/2014 FINAL   Final  SURGICAL PCR SCREEN     Status: None   Collection Time    07/20/14 11:55 PM      Result Value Ref Range Status   MRSA, PCR NEGATIVE  NEGATIVE Final   Staphylococcus aureus NEGATIVE  NEGATIVE Final   Comment:            The Xpert SA Assay (FDA     approved for NASAL specimens     in patients over 34 years of age),     is one component of     a comprehensive surveillance      program.  Test performance has     been validated by [employer] for patients greater     than or equal to 64 year old.     It is not intended     to diagnose infection nor to     guide or monitor treatment.  CULTURE, ROUTINE-ABSCESS     Status: None   Collection Time    07/21/14  8:12 AM      Result Value Ref Range Status   Specimen Description ABSCESS RIGHT FOOT   Final   Special Requests ZOSYN 3.375g   Final   Gram Stain     Final   Value: FEW WBC PRESENT, PREDOMINANTLY MONONUCLEAR     RARE SQUAMOUS EPITHELIAL CELLS PRESENT     NO ORGANISMS SEEN     Performed at [facility]   Culture     Final   Value: NO GROWTH 1 DAY     Performed at [facility]   Report Status PENDING   Incomplete  ANAEROBIC CULTURE     Status: None   Collection Time    07/21/14  8:12 AM      Result Value Ref Range Status   Specimen Description ABSCESS RIGHT FOOT   Final   Special Requests ZOSYN 3.375g   Final   Gram Stain     Final   Value: FEW WBC PRESENT, PREDOMINANTLY MONONUCLEAR     RARE SQUAMOUS EPITHELIAL CELLS PRESENT     NO ORGANISMS SEEN     Performed at [facility]   Culture     Final   Value: NO ANAEROBES ISOLATED; CULTURE IN PROGRESS FOR 5 DAYS     Performed at [facility]   Report Status PENDING   Incomplete     No results found.  Assessment: POD #1 S/P I&D of the right foot,  Plan: The packing was changed.  A dressing was reapplied to the right foot.  I will change her dressing at lunch tomorrow.  She is okay for discharge on PO antibiotics.  I recommend Cipro 500 mg BID x 10 days.  Her dressing and packing needs to be changed daily.  She is to follow-up with me at my [place] office on 07/27/2014 at 10:00 AM.  Amalio Loe IVAN 07/22/2014, 7:50 PM

## 2014-07-22 NOTE — Progress Notes (Signed)
  PROGRESS NOTE  Cassandra [name] [id number] DOB: 01/26/1933 DOA: 07/18/2014 PCP: Colette RibasGOLDING, JOHN CABOT, MD  Summary: 80 year old woman who found a small rock in her shoe which became embedded in her foot. This was subsequently removed. She developed increased swelling and erythema. She was admitted for cellulitis of the right foot.  Assessment/Plan: 1. Cellulitis right foot. Remains stable. Status post incision and drainage 8/19. CT negative for abscess. 2. Hypertension. Remains stable.   Stable. Continue antibiotics. Likely change to oral antibiotics and discharged on 8/21.   Discussed with granddaughter at bedside.  Code Status: full code DVT prophylaxis: Lovenox Family Communication:  Disposition Plan: home   Brendia Sacksaniel Goodrich, MD  [facility]  Pager [phone] If 7PM-7AM, please contact night-coverage at [email or web address], password [email or web address] 07/22/2014, 12:07 PM  LOS: 4 days   Consultants:  Podiatry  Procedures: 8/19 Incision and drainage of abscess, right foot (CPT 20005)   Antibiotics:  Vancomycin 8/16  >>  Zosyn 8/16 >>    HPI/Subjective: Doing fine. No foot pain.  Objective: Filed Vitals:   07/21/14 1150 07/21/14 1250 07/21/14 2209 07/22/14 0540  BP: 118/67 152/53 135/58 141/58  Pulse: 50 52 62 50  Temp:  97.5 F (36.4 C) 98.7 F (37.1 C) 98.3 F (36.8 C)  TempSrc:  Oral Oral Oral  Resp: 20 20 20 20   Height:      Weight:      SpO2: 99% 98% 98% 97%    Intake/Output Summary (Last 24 hours) at 07/22/14 1207 Last data filed at 07/22/14 1020  Gross per 24 hour  Intake    640 ml  Output   2350 ml  Net  -1710 ml     Filed Weights   07/18/14 1259  Weight: 78.926 kg (174 lb)    Exam:     Afebrile, vitals stable. No hypoxia.  Gen. Appears calm and comfortable. Speech fluent and clear.  Respiratory clear to auscultation bilaterally. No wheezes, rales or rhonchi. Normal respiratory effort.  Cardiovascular regular rate and rhythm. No murmur, rub  or gallop.  Data Reviewed:  Excellent urine output  Wound cultures pending. MRSA screen negative.  Scheduled Meds: . aspirin  81 mg Oral Daily  . atenolol  100 mg Oral Daily  . calcium-vitamin D  1 tablet Oral Daily  . enoxaparin (LOVENOX) injection  40 mg Subcutaneous Q24H  . famotidine  20 mg Oral BID  . latanoprost  1 drop Both Eyes QHS  . losartan  100 mg Oral Daily  . piperacillin-tazobactam (ZOSYN)  IV  3.375 g Intravenous Q8H  . sertraline  100 mg Oral Daily  . simvastatin  10 mg Oral QHS  . vancomycin  500 mg Intravenous Q12H   Continuous Infusions:    Principal Problem:   Cellulitis of foot, right Active Problems:   Hypertension   Hyperlipidemia   Time spent 15 minutes

## 2014-07-23 LAB — BASIC METABOLIC PANEL
Anion gap: 9 (ref 5–15)
BUN: 11 mg/dL (ref 6–23)
CO2: 29 mEq/L (ref 19–32)
Calcium: 9.4 mg/dL (ref 8.4–10.5)
Chloride: 108 mEq/L (ref 96–112)
Creatinine, Ser: 1.12 mg/dL — ABNORMAL HIGH (ref 0.50–1.10)
GFR calc Af Amer: 53 mL/min — ABNORMAL LOW (ref >90)
GFR, EST NON AFRICAN AMERICAN: 45 mL/min — AB (ref >90)
GLUCOSE: 100 mg/dL — AB (ref 70–99)
POTASSIUM: 4.5 meq/L (ref 3.7–5.3)
Sodium: 146 mEq/L (ref 137–147)

## 2014-07-23 MED ORDER — SODIUM CHLORIDE 0.9 % IJ SOLN
3.0000 mL | Freq: Two times a day (BID) | INTRAMUSCULAR | Status: DC
  Administered 2014-07-23: 3 mL via INTRAVENOUS

## 2014-07-23 MED ORDER — SODIUM CHLORIDE 0.9 % IV SOLN
250.0000 mL | INTRAVENOUS | Status: DC | PRN
  Administered 2014-07-23: 250 mL via INTRAVENOUS

## 2014-07-23 MED ORDER — SODIUM CHLORIDE 0.9 % IJ SOLN: 3.0000 mL | INTRAMUSCULAR | Status: DC | PRN

## 2014-07-23 MED ORDER — CIPROFLOXACIN HCL 500 MG PO TABS: 500.0000 mg | ORAL_TABLET | Freq: Two times a day (BID) | ORAL | Status: DC

## 2014-07-23 MED ORDER — CIPROFLOXACIN HCL 250 MG PO TABS: 500.0000 mg | ORAL_TABLET | Freq: Two times a day (BID) | ORAL | Status: DC

## 2014-07-23 NOTE — Progress Notes (Signed)
Discharge instructions discussed with pt and family.  IV d/ced w/o adverse effects.  Pt was taken to entrance via wheelchair to meet family at car.

## 2014-07-23 NOTE — Evaluation (Signed)
Physical Therapy Evaluation Patient Details Name: Cassandra [name] MRN: [id number] DOB: 02/18/1933 Today's Date: 07/23/2014   History of Present Illness  Cassandra [name] is a 80 y.o. female who was in her usual state of health approximately one week ago she had felt a small rock in her shoe. She did not pay any attention to it, and reports the following day she removed from her foot. Over the next week, she noticed worsening swelling, redness, pain in her right foot. She reports subjective fevers, although did not check her temperature. She describes some mild serous discharge from the wound. She is unable to walk on her foot. She was brought to the emergency room for evaluation where her foot was noted to be significantly swollen, erythematous. CT scan of the foot did not show any underlying foreign body or abscess. She is being admitted for further treatments.   Pt underwent I&D on 07/21/14 secodnary to abscess, cellulitis, and ulceration to Rt foot.    Clinical Impression  Pt is a 80 year old female who presents to PT after undergoing I&D on the Rt foot on 07/21/14.  Pt does not report complaints of pain in the Rt foot, with complaints only of pressure with WB.  Pt was (I) with bed mobility skills, mod (I) with transfers, and mod (I)/supervision and use of RW for gait of 100 feet.  Noted good step length and foot clearance during gait, though decreased cadence and WB to Rt foot during toe off secondary to recent surgical procedure.  Pt is at baseline level of function from acute PT standpoint, though may benefit from OOPT services in the future once wound heals to address ROM, balance, proprioception, and activity tolerance once allowed by MD.  Pt does report a trip and fall resulting in a concussion and passing of a kidney stone in the past month which has decreased pt activity tolerance, which may require OOPT services.  Recommend use of RW for gait to decrease WB to surgical foot.      Follow Up  Recommendations  (Pt may benefit from OOPT services after wound heals for ROM/proprioception/balance/endurance if needed)    Equipment Recommendations  Rolling walker with 5" wheels    Recommendations for Other Services       Precautions / Restrictions Precautions Precautions: Fall Precaution Comments: Wound to Rt foot Restrictions Weight Bearing Restrictions: No      Mobility  Bed Mobility Overal bed mobility: Independent                Transfers Overall transfer level: Modified independent Equipment used: Rolling walker (2 wheeled)                Ambulation/Gait Ambulation/Gait assistance: Modified independent (Device/Increase time);Supervision Ambulation Distance (Feet): 100 Feet Assistive device: Rolling walker (2 wheeled) Gait Pattern/deviations: WFL(Within Functional Limits)   Gait velocity interpretation: Below normal speed for age/gender General Gait Details: Educated pt on technique for ambulation with use of RW.       Balance Overall balance assessment: No apparent balance deficits (not formally assessed)                                           Pertinent Vitals/Pain Pain Assessment:  (Complaints of pressure with WB)    Home Living Family/patient expects to be discharged to:: Private residence Living Arrangements: Spouse/significant other (Daughters live nearby) Available Help  at Discharge: Family;Available 24 hours/day Type of Home: House Home Access: Stairs to enter Entrance Stairs-Rails: Left Entrance Stairs-Number of Steps: 2 steps to front door, 5-6 steps to back door with Lt handrail Home Layout: Laundry or work area in basement (Basement stairs with (B) handrail) Home Equipment: None Additional Comments: Tub shower    Prior Function Level of Independence: Independent         Comments: Pt reports (I) with bed mobility skills, transfers, and ambulatory skills in home and in community.  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          Extremity/Trunk Assessment               Lower Extremity Assessment: RLE deficits/detail RLE Deficits / Details: Rt foot wound packed and ACE wrap applied.  Pt able to complete complete AROM through available ROM (limited due to bandage) without complaints of pain.        Communication   Communication: No difficulties  Cognition Arousal/Alertness: Awake/alert Behavior During Therapy: WFL for tasks assessed/performed Overall Cognitive Status: Within Functional Limits for tasks assessed                               Assessment/Plan    PT Assessment Patent does not need any further PT services  PT Diagnosis     PT Problem List    PT Treatment Interventions     PT Goals (Current goals can be found in the Care Plan section) Acute Rehab PT Goals PT Goal Formulation: No goals set, d/c therapy     End of Session Equipment Utilized During Treatment: Gait belt Activity Tolerance: Patient tolerated treatment well Patient left: in bed;with call bell/phone within reach           Time: 0810-0840 PT Time Calculation (min): 30 min   Charges:   PT Evaluation $Initial PT Evaluation Tier I: 1 Procedure PT Treatments $Self Care/Home Management: 8-22 (Educated pt on appropriate HEP, use of RW, possible need for OOPT services after wound heals)    Cassandra [name] 07/23/2014, 8:55 AM

## 2014-07-23 NOTE — Discharge Summary (Signed)
Physician Discharge Summary  Cassandra [name] ZOX:[id number] DOB: [birth year] DOA: 07/18/2014  PCP: Colette Ribas, MD  Admit date: 07/18/2014 Discharge date: 07/23/2014  Recommendations for Outpatient Follow-up:  1. Resolution of cellulitis, abscess right foot 2. Follow-up wound culture 3. Home health RN for wound care assistance   Follow-up Information   Follow up with [facility].   Contact information:   [address] [place] [address] [phone]       Follow up with Dallas Schimke, DPM On 07/27/2014. (10 AM)    Specialty:  Podiatry   Contact information:   [address] [place] [address] [phone]       Follow up with Colette Ribas, MD. Schedule an appointment as soon as possible for a visit in 2 weeks.   Specialty:  Family Medicine   Contact information:   [address] [place] [address] [phone]      Discharge Diagnoses:  1. Cellulitis, abscess right foot 2. Hypertension  Discharge Condition: Improved Disposition: Home with home health RN  Diet recommendation: Heart healthy  Filed Weights   07/18/14 1259  Weight: 78.926 kg (174 lb)    History of present illness:  80 year old woman who found a small rock in her shoe which became embedded in her foot. This was subsequently removed. She developed increased swelling and erythema. She was admitted for cellulitis of the right foot.  Hospital Course:  Cassandra [name] was treated with empiric antibiotics and seen in consultation with podiatry. She underwent incision and drainage of right foot abscess and has clinically improved. She has now been cleared for discharge by podiatry, we will complete oral antibiotics and followup next week. Wound care per dietary, home health will assist.  Consultants:  Podiatry  PT: rolling walker Procedures:  8/19 Incision and drainage of abscess, right foot (CPT 20005)  Antibiotics:  Vancomycin 8/16 >>  8/21  Zosyn 8/16 >> 8/21  Ciprofloxacin 8/21 >> 8/30  Discharge Instructions  Discharge Instructions   Diet - low sodium heart healthy    Complete by:  As directed      Discharge instructions    Complete by:  As directed   Call physician or seek immediate medical attention for increased pain, drainage, fever or worsening of condition. Be sure to finish antibiotic Cipro. Dressing and packing needs to be changed daily. May put weight on right heel while wearing a postoperative shoe. Follow-up with Dr. Nolen Mu at [place] office on 07/27/2014 at 10:00 AM.            Medication List         aspirin 81 MG chewable tablet  Chew 81 mg by mouth daily.     atenolol 100 MG tablet  Commonly known as:  TENORMIN  Take 1 tablet by mouth daily.     Biotin 5000 MCG Caps  Take 10,000 mcg by mouth daily.     Calcium Carbonate-Vitamin D 600-200 MG-UNIT Tabs  Take 1 tablet by mouth daily.     carboxymethylcellulose 0.5 % Soln  Commonly known as:  REFRESH PLUS  Place 1 drop into both eyes 3 (three) times daily as needed (Dry Eyes).     ciprofloxacin 500 MG tablet  Commonly known as:  CIPRO  Take 1 tablet (500 mg total) by mouth 2 (two) times daily. Start evening 8/21.     Co Q 10 10 MG Caps  Take 10 mg by mouth daily.     latanoprost 0.005 %  ophthalmic solution  Commonly known as:  XALATAN  Place 1 drop into both eyes at bedtime.     losartan 100 MG tablet  Commonly known as:  COZAAR  Take 100 mg by mouth daily.     multivitamin with minerals Tabs tablet  Take 2 tablets by mouth daily.     naproxen 500 MG tablet  Commonly known as:  NAPROSYN  Take 500 mg by mouth daily. For pain     ranitidine 150 MG tablet  Commonly known as:  ZANTAC  Take 150 mg by mouth 2 (two) times daily.     sertraline 100 MG tablet  Commonly known as:  ZOLOFT  Take 100 mg by mouth daily.     SIMBRINZA 1-0.2 % Susp  Generic drug:  Brinzolamide-Brimonidine  Place 1 drop into both eyes 3 (three)  times daily.     simvastatin 10 MG tablet  Commonly known as:  ZOCOR  Take 10 mg by mouth at bedtime.     traMADol 50 MG tablet  Commonly known as:  ULTRAM  Take 50-100 mg by mouth every 6 (six) hours as needed. For pain     VITAMIN B-12 IJ  Inject as directed every 30 (thirty) days.       Allergies  Allergen Reactions  . Codeine Shortness Of Breath and Nausea And Vomiting  . Hydrocodone Shortness Of Breath and Nausea And Vomiting    The results of significant diagnostics from this hospitalization (including imaging, microbiology, ancillary and laboratory) are listed below for reference.    Significant Diagnostic Studies: Ct Foot Right Wo Contrast  07/18/2014   CLINICAL DATA:  Patient stepped on stone a few days ago later removing a small piece of drop. Now with foot pain and swelling. Questionable residual foreign body.  EXAM: CT OF THE RIGHT FOOT WITHOUT CONTRAST  TECHNIQUE: Multidetector CT imaging was performed according to the standard protocol. Multiplanar CT image reconstructions were also generated.  COMPARISON:  None.  FINDINGS: There is diffuse soft tissue edema over the plantar aspect of the forefoot centered on the first metatarsal head and first metatarsophalangeal joint. There is no radiopaque foreign body. There is no defined fluid collection to suggest an abscess.  No fracture. There is no bone resorption to suggest osteomyelitis. There are arthropathic changes involving multiple interphalangeal joints. Moderate-sized plantar and dorsal calcaneal spurs are noted. Bones are diffusely demineralized.  IMPRESSION: 1. No radiopaque foreign body. 2. No fracture. 3. No evidence of osteomyelitis. 4. Diffuse soft tissue edema/cellulitis centered along the plantar aspect of the forefoot below the first metatarsal head and first metatarsophalangeal joint. No defined abscess.   Electronically Signed   By: Amie Portland M.D.   On: 07/18/2014 10:04   US Arterial Seg Multiple  07/20/2014    CLINICAL DATA:  Bilateral lower extremity rest pain and claudication  EXAM: NONINVASIVE PHYSIOLOGIC VASCULAR STUDY OF BILATERAL LOWER EXTREMITIES  TECHNIQUE: Evaluation of both lower extremities was performed at rest, including calculation of ankle-brachial indices, multiple segmental pressure evaluation, segmental Doppler and segmental pulse volume recording.  COMPARISON:  None.  FINDINGS: Right ABI:  1.18  Left ABI:  1.14  Right Lower Extremity: The femoral and posterior tibial waveforms are triphasic. Superficial femoral and popliteal waveforms hemi monophasic appearance of unknown significance. Dorsalis pedis is monophasic. No pressure gradient  Left Lower Extremity: Left common femoral, femoral, popliteal, and posterior tibial waveforms are triphasic. Dorsalis pedis is monophasic.  IMPRESSION: No significant left lower extremity arterial disease.  There  is a triphasic waveform in the right posterior tibial artery but monophasic waveforms in the mid thigh. I suspect that this is artifactual and that there is no significant arterial occlusive disease in the right lower extremity. If there is strong clinical concern for arterial insufficiency, CT angiogram can be performed.   Electronically Signed   By: Maryclare BeanArt  Hoss M.D.   On: 07/20/2014 12:14    Microbiology: Recent Results (from the past 240 hour(s))  URINE CULTURE     Status: None   Collection Time    07/18/14  9:07 AM      Result Value Ref Range Status   Specimen Description URINE, CLEAN CATCH   Final   Special Requests NONE   Final   Culture  Setup Time     Final   Value: 07/18/2014 21:20     Performed at [facility]   Colony Count     Final   Value: 15,000 COLONIES/ML     Performed at [facility]   Culture     Final   Value: Multiple bacterial morphotypes present, none predominant. Suggest appropriate recollection if clinically indicated.     Performed at [facility]   Report Status 07/19/2014 FINAL   Final    SURGICAL PCR SCREEN     Status: None   Collection Time    07/20/14 11:55 PM      Result Value Ref Range Status   MRSA, PCR NEGATIVE  NEGATIVE Final   Staphylococcus aureus NEGATIVE  NEGATIVE Final   Comment:            The Xpert SA Assay (FDA     approved for NASAL specimens     in patients over 78 years of age),     is one component of     a comprehensive surveillance     program.  Test performance has     been validated by [facility] for patients greater     than or equal to 78 year old.     It is not intended     to diagnose infection nor to     guide or monitor treatment.  CULTURE, ROUTINE-ABSCESS     Status: None   Collection Time    07/21/14  8:12 AM      Result Value Ref Range Status   Specimen Description ABSCESS RIGHT FOOT   Final   Special Requests ZOSYN 3.375g   Final   Gram Stain     Final   Value: FEW WBC PRESENT, PREDOMINANTLY MONONUCLEAR     RARE SQUAMOUS EPITHELIAL CELLS PRESENT     NO ORGANISMS SEEN     Performed at [facility]   Culture     Final   Value: NO GROWTH 2 DAYS     Performed at [facility]   Report Status PENDING   Incomplete  ANAEROBIC CULTURE     Status: None   Collection Time    07/21/14  8:12 AM      Result Value Ref Range Status   Specimen Description ABSCESS RIGHT FOOT   Final   Special Requests ZOSYN 3.375g   Final   Gram Stain     Final   Value: FEW WBC PRESENT, PREDOMINANTLY MONONUCLEAR     RARE SQUAMOUS EPITHELIAL CELLS PRESENT     NO ORGANISMS SEEN     Performed at [facility]   Culture     Final  Value: NO ANAEROBES ISOLATED; CULTURE IN PROGRESS FOR 5 DAYS     Performed at [facility]   Report Status PENDING   Incomplete     Labs: Basic Metabolic Panel:  Recent Labs Lab 07/18/14 0927 07/19/14 0530 07/20/14 0539 07/21/14 0555 07/23/14 0519  NA 143 146 145 146 146  K 3.8 4.3 4.6 4.7 4.5  CL 107 109 110 110 108  CO2 24 26 25 28 29   GLUCOSE 101* 110* 90 98 100*  BUN 16 15  13 11 11   CREATININE 0.85 0.93 0.99 1.06 1.12*  CALCIUM 9.2 8.9 9.1 9.1 9.4   Liver Function Tests:  Recent Labs Lab 07/18/14 0927  AST 14  ALT 12  ALKPHOS 79  BILITOT 0.8  PROT 6.5  ALBUMIN 3.4*   CBC:  Recent Labs Lab 07/18/14 0927 07/19/14 0530 07/20/14 0539 07/21/14 0555  WBC 12.0* 7.9 6.0 5.6  NEUTROABS 10.3*  --   --   --   HGB 11.4* 10.6* 10.8* 10.8*  HCT 34.0* 32.5* 32.9* 33.3*  MCV 92.4 93.9 93.5 93.0  PLT 201 194 190 238    Principal Problem:   Cellulitis of foot, right Active Problems:   Hypertension   Hyperlipidemia   Time coordinating discharge: 25 minutes  Signed:  Brendia Sacks, MD [facility] 07/23/2014, 2:19 PM

## 2014-07-23 NOTE — Progress Notes (Addendum)
Podiatry Progress Note  Subjective: Cassandra [name] is a 80 y.o. female POD #2 following incision and drainage of the right foot.  Her pain is well controlled.  She denies any nausea, vomiting, fever or chills.    Past Medical History  Diagnosis Date  . Hypertension   . Arthritis   . Depression   . Hyperlipidemia   . PFO (patent foramen ovale)     small  . H/O echocardiogram 2008    prolapse of post. mitral leaflet, tr MR, tr TR, EF >55%, mild concentric LVH  . H/O cardiovascular stress test 10/13/2007    low risk scan, no changes from previous test  . Heart palpitations     controlled on BB  . Hiatal hernia     Scheduled Meds: . aspirin  81 mg Oral Daily  . atenolol  100 mg Oral Daily  . calcium-vitamin D  1 tablet Oral Daily  . enoxaparin (LOVENOX) injection  40 mg Subcutaneous Q24H  . famotidine  20 mg Oral BID  . latanoprost  1 drop Both Eyes QHS  . losartan  100 mg Oral Daily  . piperacillin-tazobactam (ZOSYN)  IV  3.375 g Intravenous Q8H  . sertraline  100 mg Oral Daily  . simvastatin  10 mg Oral QHS  . sodium chloride  3 mL Intravenous Q12H  . vancomycin  500 mg Intravenous Q12H   Continuous Infusions:   PRN Meds:.sodium chloride, acetaminophen, acetaminophen, ondansetron (ZOFRAN) IV, ondansetron, sodium chloride, traMADol  Allergies  Allergen Reactions  . Codeine Shortness Of Breath and Nausea And Vomiting  . Hydrocodone Shortness Of Breath and Nausea And Vomiting   Past Surgical History  Procedure Laterality Date  . Cholecystectomy    . Cardiac catheterization  10/2002    normal coronary arteries, normal LV function  . Incision and drainage Right 07/21/2014    Procedure: INCISION AND DRAINAGE;  Surgeon: Dallas SchimkeBenjamin Ivan Thaila Bottoms, DPM;  Location: [facility];  Service: Podiatry;  Laterality: Right;    Family History  Problem Relation Age of Onset  . Heart disease Mother   . Cancer Father   . Kidney disease Brother   . Hypertension Daughter   .  Hyperlipidemia Son     Social History:  reports that she has never smoked. She has never used smokeless tobacco. She reports that she does not drink alcohol or use illicit drugs.  Physical Examination: Vital signs in last 24 hours:   Temp:  [97.8 F (36.6 C)-98.3 F (36.8 C)] 97.8 F (36.6 C) (08/21 0605) Pulse Rate:  [51-52] 51 (08/21 0605) Resp:  [20] 20 (08/21 0605) BP: (122-160)/(55-70) 160/70 mmHg (08/21 0605) SpO2:  [92 %-100 %] 100 % (08/21 0605)  Surgical dressing is clean, dry and intact without strikethrough.  Erythema is improved.  Wound bed is comprised of fibrotic and granular tissue.  Pedal pulses are palpable.  Calves are soft and nontender.  Lab/Test Results:    Recent Labs  07/21/14 0555 07/23/14 0519  WBC 5.6  --   HGB 10.8*  --   HCT 33.3*  --   PLT 238  --   NA 146 146  K 4.7 4.5  CL 110 108  CO2 28 29  BUN 11 11  CREATININE 1.06 1.12*  GLUCOSE 98 100*  CALCIUM 9.1 9.4    Recent Results (from the past 240 hour(s))  URINE CULTURE     Status: None   Collection Time    07/18/14  9:07 AM  Result Value Ref Range Status   Specimen Description URINE, CLEAN CATCH   Final   Special Requests NONE   Final   Culture  Setup Time     Final   Value: 07/18/2014 21:20     Performed at [facility] Count     Final   Value: 15,000 COLONIES/ML     Performed at [facility]   Culture     Final   Value: Multiple bacterial morphotypes present, none predominant. Suggest appropriate recollection if clinically indicated.     Performed at [facility]   Report Status 07/19/2014 FINAL   Final  SURGICAL PCR SCREEN     Status: None   Collection Time    07/20/14 11:55 PM      Result Value Ref Range Status   MRSA, PCR NEGATIVE  NEGATIVE Final   Staphylococcus aureus NEGATIVE  NEGATIVE Final   Comment:            The Xpert SA Assay (FDA     approved for NASAL specimens     in patients over 21 years of age),     is one component  of     a comprehensive surveillance     program.  Test performance has     been validated by [facility] for patients greater     than or equal to 65 year old.     It is not intended     to diagnose infection nor to     guide or monitor treatment.  CULTURE, ROUTINE-ABSCESS     Status: None   Collection Time    07/21/14  8:12 AM      Result Value Ref Range Status   Specimen Description ABSCESS RIGHT FOOT   Final   Special Requests ZOSYN 3.375g   Final   Gram Stain     Final   Value: FEW WBC PRESENT, PREDOMINANTLY MONONUCLEAR     RARE SQUAMOUS EPITHELIAL CELLS PRESENT     NO ORGANISMS SEEN     Performed at [facility]   Culture     Final   Value: NO GROWTH 2 DAYS     Performed at [facility]   Report Status PENDING   Incomplete  ANAEROBIC CULTURE     Status: None   Collection Time    07/21/14  8:12 AM      Result Value Ref Range Status   Specimen Description ABSCESS RIGHT FOOT   Final   Special Requests ZOSYN 3.375g   Final   Gram Stain     Final   Value: FEW WBC PRESENT, PREDOMINANTLY MONONUCLEAR     RARE SQUAMOUS EPITHELIAL CELLS PRESENT     NO ORGANISMS SEEN     Performed at [facility]   Culture     Final   Value: NO ANAEROBES ISOLATED; CULTURE IN PROGRESS FOR 5 DAYS     Performed at [facility]   Report Status PENDING   Incomplete     No results found.  Assessment: POD #2 S/P I&D of the right foot,  Plan: The packing was changed.  A dressing was reapplied to the right foot.  She is okay for discharge on PO antibiotics.  I recommend Cipro 500 mg BID x 10 days.  Her dressing and packing needs to be changed daily.  She may put weight on her right heel while wearing a postoperative shoe.  She is to follow-up with me at my [place] office on 07/27/2014 at 10:00 AM.  Ferman Hamming IVAN 07/23/2014, 12:27 PM

## 2014-07-23 NOTE — Progress Notes (Signed)
UR chart review completed.  

## 2014-07-23 NOTE — Progress Notes (Signed)
  PROGRESS NOTE  Cassandra KeensBetty R [name] ZOX:[id number] DOB: 07/23/1933 DOA: 07/18/2014 PCP: Colette RibasGOLDING, JOHN CABOT, MD  Summary: 80 year old woman who found a small rock in her shoe which became embedded in her foot. This was subsequently removed. She developed increased swelling and erythema. She was admitted for cellulitis of the right foot.  Assessment/Plan: 1. Cellulitis right foot. Stable, clear for discharge per Dr. Nolen MuMcKinney. Status post incision and drainage 8/19. CT negative for abscess. 2. Hypertension. Stable.   [facility], rolling walker  Cipro per Dr. Nolen MuMcKinney  Dressing and packing needs to be changed daily.   May put weight on her right heel while wearing a postoperative shoe.   She is to follow-up with Dr. Nolen MuMcKinney at [place] on 07/27/2014 at 10:00 AM.  Brendia Sacksaniel Goodrich, MD  [facility]  Pager [phone] If 7PM-7AM, please contact night-coverage at [email or web address], password [email or web address] 07/23/2014, 2:05 PM  LOS: 5 days   Consultants:  Podiatry  PT: rolling walker  Procedures: 8/19 Incision and drainage of abscess, right foot (CPT 20005)   Antibiotics:  Vancomycin 8/16  >> 8/21 Zosyn 8/16 >> 8/21 Ciprofloxacin 8/21 >> 8/30  HPI/Subjective: Overall doing well.  Objective: Filed Vitals:   07/21/14 2209 07/22/14 0540 07/22/14 1500 07/23/14 0605  BP: 135/58 141/58 122/55 160/70  Pulse: 62 50 52 51  Temp: 98.7 F (37.1 C) 98.3 F (36.8 C) 98.3 F (36.8 C) 97.8 F (36.6 C)  TempSrc: Oral Oral Oral Oral  Resp: 20 20 20 20   Height:      Weight:      SpO2: 98% 97% 92% 100%    Intake/Output Summary (Last 24 hours) at 07/23/14 1405 Last data filed at 07/23/14 1241  Gross per 24 hour  Intake    843 ml  Output   1425 ml  Net   -582 ml     Filed Weights   07/18/14 1259  Weight: 78.926 kg (174 lb)    Exam:     Afebrile, vital signs stable. No hypoxia.  Gen. Appears calm and comfortable.  Cardiovascular regular rate and rhythm. No murmur,  rub or gallop.  Respiratory clear to auscultation bilaterally. No wheezes, rales or rhonchi. Normal respiratory effort.  Right foot bandaged.  Data Reviewed:  Basic metabolic panel unremarkable.  Scheduled Meds: . aspirin  81 mg Oral Daily  . atenolol  100 mg Oral Daily  . calcium-vitamin D  1 tablet Oral Daily  . enoxaparin (LOVENOX) injection  40 mg Subcutaneous Q24H  . famotidine  20 mg Oral BID  . latanoprost  1 drop Both Eyes QHS  . losartan  100 mg Oral Daily  . piperacillin-tazobactam (ZOSYN)  IV  3.375 g Intravenous Q8H  . sertraline  100 mg Oral Daily  . simvastatin  10 mg Oral QHS  . sodium chloride  3 mL Intravenous Q12H  . vancomycin  500 mg Intravenous Q12H   Continuous Infusions:    Principal Problem:   Cellulitis of foot, right Active Problems:   Hypertension   Hyperlipidemia

## 2014-07-24 LAB — CULTURE, ROUTINE-ABSCESS: Culture: NO GROWTH

## 2014-07-26 LAB — ANAEROBIC CULTURE

## 2014-07-26 NOTE — Progress Notes (Signed)
UR chart review completed.  

## 2014-09-14 ENCOUNTER — Other Ambulatory Visit (HOSPITAL_COMMUNITY): Payer: Self-pay | Admitting: Podiatry

## 2014-09-14 DIAGNOSIS — M8580 Other specified disorders of bone density and structure, unspecified site: Principal | ICD-10-CM

## 2014-09-14 DIAGNOSIS — M869 Osteomyelitis, unspecified: Principal | ICD-10-CM

## 2014-09-14 DIAGNOSIS — M659 Synovitis and tenosynovitis, unspecified: Principal | ICD-10-CM

## 2014-09-14 DIAGNOSIS — L709 Acne, unspecified: Principal | ICD-10-CM

## 2014-09-14 DIAGNOSIS — L403 Pustulosis palmaris et plantaris: Secondary | ICD-10-CM

## 2014-09-15 ENCOUNTER — Encounter (HOSPITAL_COMMUNITY): Payer: Self-pay | Admitting: Emergency Medicine

## 2014-09-15 ENCOUNTER — Emergency Department (HOSPITAL_COMMUNITY): Payer: Medicare Other

## 2014-09-15 ENCOUNTER — Inpatient Hospital Stay (HOSPITAL_COMMUNITY)
Admission: EM | Admit: 2014-09-15 | Discharge: 2014-09-22 | DRG: 478 | Disposition: A | Discharge status: Skilled Nursing Facility | Payer: Medicare Other | Attending: Internal Medicine | Admitting: Internal Medicine

## 2014-09-15 ENCOUNTER — Ambulatory Visit (HOSPITAL_COMMUNITY)
Admission: RE | Admit: 2014-09-15 | Discharge: 2014-09-15 | Disposition: A | Discharge status: Home / Self Care | Payer: Medicare Other | Source: Ambulatory Visit | Attending: Podiatry | Admitting: Podiatry

## 2014-09-15 DIAGNOSIS — Q2112 Patent foramen ovale: Secondary | ICD-10-CM

## 2014-09-15 DIAGNOSIS — M7989 Other specified soft tissue disorders: Secondary | ICD-10-CM | POA: Diagnosis not present

## 2014-09-15 DIAGNOSIS — M8580 Other specified disorders of bone density and structure, unspecified site: Principal | ICD-10-CM

## 2014-09-15 DIAGNOSIS — L03039 Cellulitis of unspecified toe: Secondary | ICD-10-CM | POA: Diagnosis present

## 2014-09-15 DIAGNOSIS — L97519 Non-pressure chronic ulcer of other part of right foot with unspecified severity: Secondary | ICD-10-CM

## 2014-09-15 DIAGNOSIS — M869 Osteomyelitis, unspecified: Principal | ICD-10-CM

## 2014-09-15 DIAGNOSIS — E780 Pure hypercholesterolemia: Secondary | ICD-10-CM | POA: Diagnosis present

## 2014-09-15 DIAGNOSIS — Z7982 Long term (current) use of aspirin: Secondary | ICD-10-CM

## 2014-09-15 DIAGNOSIS — Z9889 Other specified postprocedural states: Secondary | ICD-10-CM

## 2014-09-15 DIAGNOSIS — Z79899 Other long term (current) drug therapy: Secondary | ICD-10-CM

## 2014-09-15 DIAGNOSIS — L403 Pustulosis palmaris et plantaris: Secondary | ICD-10-CM

## 2014-09-15 DIAGNOSIS — E785 Hyperlipidemia, unspecified: Secondary | ICD-10-CM

## 2014-09-15 DIAGNOSIS — I10 Essential (primary) hypertension: Secondary | ICD-10-CM

## 2014-09-15 DIAGNOSIS — Z8249 Family history of ischemic heart disease and other diseases of the circulatory system: Secondary | ICD-10-CM

## 2014-09-15 DIAGNOSIS — M009 Pyogenic arthritis, unspecified: Secondary | ICD-10-CM | POA: Diagnosis not present

## 2014-09-15 DIAGNOSIS — D649 Anemia, unspecified: Secondary | ICD-10-CM

## 2014-09-15 DIAGNOSIS — B9561 Methicillin susceptible Staphylococcus aureus infection as the cause of diseases classified elsewhere: Secondary | ICD-10-CM | POA: Diagnosis present

## 2014-09-15 DIAGNOSIS — E876 Hypokalemia: Secondary | ICD-10-CM | POA: Diagnosis present

## 2014-09-15 DIAGNOSIS — Q211 Atrial septal defect: Secondary | ICD-10-CM

## 2014-09-15 DIAGNOSIS — Z9289 Personal history of other medical treatment: Secondary | ICD-10-CM

## 2014-09-15 DIAGNOSIS — Z809 Family history of malignant neoplasm, unspecified: Secondary | ICD-10-CM

## 2014-09-15 DIAGNOSIS — L02612 Cutaneous abscess of left foot: Secondary | ICD-10-CM

## 2014-09-15 DIAGNOSIS — R002 Palpitations: Secondary | ICD-10-CM

## 2014-09-15 DIAGNOSIS — M659 Synovitis and tenosynovitis, unspecified: Principal | ICD-10-CM

## 2014-09-15 DIAGNOSIS — L709 Acne, unspecified: Principal | ICD-10-CM

## 2014-09-15 DIAGNOSIS — L03032 Cellulitis of left toe: Secondary | ICD-10-CM

## 2014-09-15 DIAGNOSIS — M1990 Unspecified osteoarthritis, unspecified site: Secondary | ICD-10-CM | POA: Diagnosis present

## 2014-09-15 HISTORY — DX: Cutaneous abscess of left foot: L02.612

## 2014-09-15 HISTORY — DX: Cellulitis of left toe: L03.032

## 2014-09-15 MED ORDER — SODIUM CHLORIDE 0.9 % IV BOLUS (SEPSIS)
500.0000 mL | Freq: Once | INTRAVENOUS | Status: AC
  Administered 2014-09-16: 500 mL via INTRAVENOUS

## 2014-09-15 MED ORDER — GADOBENATE DIMEGLUMINE 529 MG/ML IV SOLN
15.0000 mL | Freq: Once | INTRAVENOUS | Status: AC | PRN
  Administered 2014-09-15: 15 mL via INTRAVENOUS

## 2014-09-15 MED ORDER — SODIUM CHLORIDE 0.9 % IV SOLN
INTRAVENOUS | Status: DC
  Administered 2014-09-16: via INTRAVENOUS

## 2014-09-15 MED ORDER — MORPHINE SULFATE 4 MG/ML IJ SOLN
4.0000 mg | Freq: Once | INTRAMUSCULAR | Status: AC
  Administered 2014-09-16: 4 mg via INTRAVENOUS
  Filled 2014-09-15: qty 1

## 2014-09-15 MED ORDER — VANCOMYCIN HCL IN DEXTROSE 1-5 GM/200ML-% IV SOLN
1000.0000 mg | Freq: Once | INTRAVENOUS | Status: AC
  Administered 2014-09-16: 1000 mg via INTRAVENOUS
  Filled 2014-09-15: qty 200

## 2014-09-15 MED ORDER — PIPERACILLIN-TAZOBACTAM 3.375 G IVPB 30 MIN
3.3750 g | Freq: Once | INTRAVENOUS | Status: AC
  Administered 2014-09-16: 3.375 g via INTRAVENOUS
  Filled 2014-09-15: qty 50

## 2014-09-15 MED ORDER — ONDANSETRON HCL 4 MG/2ML IJ SOLN
4.0000 mg | Freq: Once | INTRAMUSCULAR | Status: AC
  Administered 2014-09-16: 4 mg via INTRAVENOUS
  Filled 2014-09-15: qty 2

## 2014-09-15 NOTE — ED Provider Notes (Signed)
CSN: [id number]     Arrival date & time 09/15/14  2002 History  This chart was scribed for Ward Givens, MD by Murriel Hopper, ED Scribe. This patient was seen in room [facility] and the patient's care was started at 11:18 PM.   Chief Complaint  Patient presents with  . Foot Pain     (Consider location/radiation/quality/duration/timing/severity/associated sxs/prior Treatment) The history is provided by the patient. No language interpreter was used.     HPI Comments: Cassandra [name] is a 80 y.o. female who presents to the Emergency Department complaining of pain in L big toe with associated swelling, redness, and drainage that began about a week ago. Pt notes onset of fever and chills 2 days ago with temperature of 99. Pt notes that two days PTA toe did not have as much edema, or swelling.. Pt was seen by Dr. Nolen Mu, a foot specialist, yesterday in followup for the infection she had in her right great toe in August when she was admitted. and he did a xray of her left foot when she pointed out it was starting to swell. Dr. Nolen Mu sent her to get an MRI on foot today. Her next appointment with Dr. Nolen Mu is in two days. She reports today the toe has developed a purple area on it and is starting to drain fluid. Pt denies having diabetes or lack of circulation in the foot. Pt also denies Nausea, Vomiting, or Diarrhea.    PCP: Dr. Alycia Rossetti Dr Nolen Mu   Past Medical History  Diagnosis Date  . Hypertension   . Arthritis   . Depression   . Hyperlipidemia   . PFO (patent foramen ovale)     small  . H/O echocardiogram 2008    prolapse of post. mitral leaflet, tr MR, tr TR, EF >55%, mild concentric LVH  . H/O cardiovascular stress test 10/13/2007    low risk scan, no changes from previous test  . Heart palpitations     controlled on BB  . Hiatal hernia    Past Surgical History  Procedure Laterality Date  . Cholecystectomy    . Cardiac catheterization  10/2002    normal  coronary arteries, normal LV function  . Incision and drainage Right 07/21/2014    Procedure: INCISION AND DRAINAGE;  Surgeon: Dallas Schimke, DPM;  Location: [facility];  Service: Podiatry;  Laterality: Right;   Family History  Problem Relation Age of Onset  . Heart disease Mother   . Cancer Father   . Kidney disease Brother   . Hypertension Daughter   . Hyperlipidemia Son    History  Substance Use Topics  . Smoking status: Never Smoker   . Smokeless tobacco: Never Used  . Alcohol Use: No   Lives at home Lives alone  OB History   Grav Para Term Preterm Abortions TAB SAB Ect Mult Living                 Review of Systems  Constitutional: Positive for fever and chills.  Skin: Positive for color change and wound.  All other systems reviewed and are negative.     Allergies  Codeine and Hydrocodone  Home Medications   Prior to Admission medications   Medication Sig Start Date End Date Taking? Authorizing Provider  aspirin 81 MG chewable tablet Chew 81 mg by mouth daily.   Yes Historical Provider, MD  atenolol (TENORMIN) 100 MG tablet Take 1 tablet by mouth daily. 08/26/13  Yes Historical Provider, MD  Biotin 5000 MCG CAPS Take 10,000 mcg by mouth daily.    Yes Historical Provider, MD  Calcium Carbonate-Vitamin D (CALCIUM + D) 600-200 MG-UNIT TABS Take 1 tablet by mouth daily.   Yes Historical Provider, MD  Coenzyme Q10 (CO Q 10) 10 MG CAPS Take 10 mg by mouth daily.    Yes Historical Provider, MD  latanoprost (XALATAN) 0.005 % ophthalmic solution Place 1 drop into both eyes at bedtime.   Yes Historical Provider, MD  Multiple Vitamin (MULTIVITAMIN WITH MINERALS) TABS tablet Take 2 tablets by mouth daily.   Yes Historical Provider, MD  ranitidine (ZANTAC) 150 MG tablet Take 150 mg by mouth 2 (two) times daily.   Yes Historical Provider, MD  sertraline (ZOLOFT) 100 MG tablet Take 100 mg by mouth daily.  08/11/13  Yes Historical Provider, MD  silver sulfADIAZINE (SILVADENE) 1  % cream Apply 1 application topically 2 (two) times daily. 08/31/14  Yes Historical Provider, MD  SIMBRINZA 1-0.2 % SUSP Place 1 drop into both eyes 3 (three) times daily. 06/14/14  Yes Historical Provider, MD  simvastatin (ZOCOR) 10 MG tablet Take 10 mg by mouth at bedtime.   Yes Historical Provider, MD  carboxymethylcellulose (REFRESH PLUS) 0.5 % SOLN Place 1 drop into both eyes 3 (three) times daily as needed (Dry Eyes).    Historical Provider, MD  Cyanocobalamin (VITAMIN B-12 IJ) Inject as directed every 30 (thirty) days.    Historical Provider, MD  naproxen (NAPROSYN) 500 MG tablet Take 500 mg by mouth daily. For pain    Historical Provider, MD  traMADol (ULTRAM) 50 MG tablet Take 50-100 mg by mouth every 6 (six) hours as needed. For pain    Historical Provider, MD   BP 114/48  Pulse 87  Temp(Src) 98.9 F (37.2 C) (Oral)  Resp 20  Ht 5\' 8"  (1.727 m)  Wt 175 lb (79.379 kg)  BMI 26.61 kg/m2  SpO2 97%  Vital signs normal   Physical Exam  Nursing note and vitals reviewed. Constitutional: She is oriented to person, place, and time. She appears well-developed and well-nourished.  Non-toxic appearance. She does not appear ill. No distress.  HENT:  Head: Normocephalic and atraumatic.  Right Ear: External ear normal.  Left Ear: External ear normal.  Nose: Nose normal. No mucosal edema or rhinorrhea.  Mouth/Throat: Oropharynx is clear and moist and mucous membranes are normal. No dental abscesses or uvula swelling.  Eyes: Conjunctivae and EOM are normal. Pupils are equal, round, and reactive to light.  Neck: Normal range of motion and full passive range of motion without pain. Neck supple.  Cardiovascular: Normal rate, regular rhythm and normal heart sounds.  Exam reveals no gallop and no friction rub.   No murmur heard. Pulmonary/Chest: Effort normal and breath sounds normal. No respiratory distress. She has no wheezes. She has no rhonchi. She has no rales. She exhibits no tenderness and  no crepitus.  Abdominal: Soft. Normal appearance and bowel sounds are normal. She exhibits no distension. There is no tenderness. There is no rebound and no guarding.  Musculoskeletal: Normal range of motion. She exhibits edema and tenderness.  Red streak half-way up left lower leg with bright red discoloration of distal foot and swelling. Swelling to L great toe with purplish discoloration and drainage. See Photo.  Neurological: She is alert and oriented to person, place, and time. She has normal strength. No cranial nerve deficit.  Skin: Skin is warm, dry and intact. No rash noted. No erythema. No pallor.  Psychiatric: She has a normal mood and affect. Her speech is normal and behavior is normal. Her mood appears not anxious.        ED Course  Procedures (including critical care time)  Medications  0.9 %  sodium chloride infusion ( Intravenous New Bag/Given 09/16/14 0025)  vancomycin (VANCOCIN) IVPB 1000 mg/200 mL premix (1,000 mg Intravenous New Bag/Given 09/16/14 0104)  piperacillin-tazobactam (ZOSYN) IVPB 3.375 g (0 g Intravenous Stopped 09/16/14 0056)  morphine 4 MG/ML injection 4 mg (4 mg Intravenous Given 09/16/14 0001)  ondansetron (ZOFRAN) injection 4 mg (4 mg Intravenous Given 09/16/14 0002)  sodium chloride 0.9 % bolus 500 mL (0 mLs Intravenous Stopped 09/16/14 0104)  sodium chloride 0.9 % bolus 1,000 mL (1,000 mLs Intravenous New Bag/Given 09/16/14 0158)     DIAGNOSTIC STUDIES: Oxygen Saturation is 97% on room air, normal by my interpretation.    COORDINATION OF CARE: 11:30 PM Discussed treatment plan with pt at bedside and pt agreed to plan. Patient was given IV fluids, pain and nausea medication. She was started on broad-spectrum antibiotics for presumed infected toe with possible osteomyelitis.  Review of prior studies shows patient had outpatient MRI of her foot today. However it has not been read by the radiologist. Dr. Adriana Reams x-ray was done in his office and is  not available to me. Patient was admitted in August for an infection in her right great toe. At that time, August 18 she had arterial studies done which showed she had normal arterial blood flow in her left lower leg.  23:50 I spoke to radiology who felt her MR could be read in the am since she was not going to the OR tonight.   01:50 Pt given her test results and is agreeable to admission, wants to stay at [facility], states her orthopedics are [facility].   01:57 Dr Barnie Del admit to med-surg team 2   Labs Review Results for orders placed during the hospital encounter of 09/15/14  CBC WITH DIFFERENTIAL      Result Value Ref Range   WBC 20.5 (*) 4.0 - 10.5 K/uL   RBC 3.70 (*) 3.87 - 5.11 MIL/uL   Hemoglobin 11.3 (*) 12.0 - 15.0 g/dL   HCT 91.4 (*) 78.2 - 95.6 %   MCV 90.0  78.0 - 100.0 fL   MCH 30.5  26.0 - 34.0 pg   MCHC 33.9  30.0 - 36.0 g/dL   RDW 21.3  08.6 - 57.8 %   Platelets 237  150 - 400 K/uL   Neutrophils Relative % 88 (*) 43 - 77 %   Neutro Abs 18.0 (*) 1.7 - 7.7 K/uL   Lymphocytes Relative 5 (*) 12 - 46 %   Lymphs Abs 1.0  0.7 - 4.0 K/uL   Monocytes Relative 7  3 - 12 %   Monocytes Absolute 1.4 (*) 0.1 - 1.0 K/uL   Eosinophils Relative 0  0 - 5 %   Eosinophils Absolute 0.0  0.0 - 0.7 K/uL   Basophils Relative 0  0 - 1 %   Basophils Absolute 0.0  0.0 - 0.1 K/uL  COMPREHENSIVE METABOLIC PANEL      Result Value Ref Range   Sodium 136 (*) 137 - 147 mEq/L   Potassium 4.0  3.7 - 5.3 mEq/L   Chloride 100  96 - 112 mEq/L   CO2 25  19 - 32 mEq/L   Glucose, Bld 115 (*) 70 - 99 mg/dL   BUN 20  6 -  23 mg/dL   Creatinine, Ser 1.610.91  0.50 - 1.10 mg/dL   Calcium 9.5  8.4 - 09.610.5 mg/dL   Total Protein 7.2  6.0 - 8.3 g/dL   Albumin 3.4 (*) 3.5 - 5.2 g/dL   AST 14  0 - 37 U/L   ALT 13  0 - 35 U/L   Alkaline Phosphatase 107  39 - 117 U/L   Total Bilirubin 1.2  0.3 - 1.2 mg/dL   GFR calc non Af Amer 58 (*) >90 mL/min   GFR calc Af Amer 68 (*) >90 mL/min   Anion gap 11  5 -  15  SEDIMENTATION RATE      Result Value Ref Range   Sed Rate 66 (*) 0 - 22 mm/hr  LACTIC ACID, PLASMA      Result Value Ref Range   Lactic Acid, Venous 1.2  0.5 - 2.2 mmol/L  CBG MONITORING, ED      Result Value Ref Range   Glucose-Capillary 123 (*) 70 - 99 mg/dL    Laboratory interpretation all normal except for leukocytosis and elevated sedimentation rate    Imaging Review Dg Toe Great Left  09/16/2014   CLINICAL DATA:  Left great toe swelling and drainage. Initial encounter.  EXAM: LEFT GREAT TOE  COMPARISON:  Left forefoot MRI - 09/15/2014  FINDINGS: There is diffuse soft tissue swelling about the great toe. This finding is associated with osteolysis involving the base of the distal phalanx of the great toe and deformity of the IP joint. No definite fracture or dislocation. No radiopaque foreign body.  Remaining joint spaces appear preserved.  IMPRESSION: Findings worrisome for septic arthritis involving the IP joint of the great toe with osteomyelitis involving at least the base of the distal phalanx of the great toe. Correlation with MRI performed 09/15/2014 is recommended.   Electronically Signed   By: Simonne ComeJohn  Watts M.D.   On: 09/16/2014 01:13     EKG Interpretation None      MDM   Final diagnoses:  Septic arthritis of interphalangeal joint of toe of left foot   Plan admission  Devoria AlbeIva Allean Montfort, MD, FACEP   I personally performed the services described in this documentation, which was scribed in my presence. The recorded information has been reviewed and considered.  Devoria AlbeIva Shamonica Schadt, MD, Armando GangFACEP    Ward GivensIva L Narda Fundora, MD 09/16/14 [phone]

## 2014-09-15 NOTE — ED Notes (Signed)
Pt c/o left foot pain x one week with drainage from great toe.

## 2014-09-16 ENCOUNTER — Encounter (HOSPITAL_COMMUNITY): Payer: Self-pay | Admitting: Internal Medicine

## 2014-09-16 DIAGNOSIS — Z79899 Other long term (current) drug therapy: Secondary | ICD-10-CM | POA: Diagnosis not present

## 2014-09-16 DIAGNOSIS — L97519 Non-pressure chronic ulcer of other part of right foot with unspecified severity: Secondary | ICD-10-CM | POA: Diagnosis present

## 2014-09-16 DIAGNOSIS — I10 Essential (primary) hypertension: Secondary | ICD-10-CM

## 2014-09-16 DIAGNOSIS — D649 Anemia, unspecified: Secondary | ICD-10-CM | POA: Diagnosis present

## 2014-09-16 DIAGNOSIS — E876 Hypokalemia: Secondary | ICD-10-CM | POA: Diagnosis present

## 2014-09-16 DIAGNOSIS — E785 Hyperlipidemia, unspecified: Secondary | ICD-10-CM | POA: Diagnosis present

## 2014-09-16 DIAGNOSIS — Z809 Family history of malignant neoplasm, unspecified: Secondary | ICD-10-CM | POA: Diagnosis not present

## 2014-09-16 DIAGNOSIS — M869 Osteomyelitis, unspecified: Secondary | ICD-10-CM | POA: Diagnosis present

## 2014-09-16 DIAGNOSIS — Z7982 Long term (current) use of aspirin: Secondary | ICD-10-CM | POA: Diagnosis not present

## 2014-09-16 DIAGNOSIS — M009 Pyogenic arthritis, unspecified: Secondary | ICD-10-CM | POA: Diagnosis present

## 2014-09-16 DIAGNOSIS — L03039 Cellulitis of unspecified toe: Secondary | ICD-10-CM | POA: Diagnosis present

## 2014-09-16 DIAGNOSIS — B9561 Methicillin susceptible Staphylococcus aureus infection as the cause of diseases classified elsewhere: Secondary | ICD-10-CM | POA: Diagnosis present

## 2014-09-16 DIAGNOSIS — L03032 Cellulitis of left toe: Secondary | ICD-10-CM

## 2014-09-16 DIAGNOSIS — M00872 Arthritis due to other bacteria, left ankle and foot: Secondary | ICD-10-CM

## 2014-09-16 DIAGNOSIS — M7989 Other specified soft tissue disorders: Secondary | ICD-10-CM | POA: Diagnosis present

## 2014-09-16 DIAGNOSIS — M1990 Unspecified osteoarthritis, unspecified site: Secondary | ICD-10-CM | POA: Diagnosis present

## 2014-09-16 DIAGNOSIS — Z8249 Family history of ischemic heart disease and other diseases of the circulatory system: Secondary | ICD-10-CM | POA: Diagnosis not present

## 2014-09-16 DIAGNOSIS — L02612 Cutaneous abscess of left foot: Secondary | ICD-10-CM | POA: Diagnosis present

## 2014-09-16 DIAGNOSIS — R002 Palpitations: Secondary | ICD-10-CM | POA: Diagnosis present

## 2014-09-16 DIAGNOSIS — E780 Pure hypercholesterolemia: Secondary | ICD-10-CM | POA: Diagnosis present

## 2014-09-16 HISTORY — DX: Cellulitis of left toe: L02.612

## 2014-09-16 HISTORY — DX: Cellulitis of left toe: L03.032

## 2014-09-16 LAB — CBC WITH DIFFERENTIAL/PLATELET
Basophils Absolute: 0 10*3/uL (ref 0.0–0.1)
Basophils Relative: 0 % (ref 0–1)
Eosinophils Absolute: 0 10*3/uL (ref 0.0–0.7)
Eosinophils Relative: 0 % (ref 0–5)
HCT: 33.3 % — ABNORMAL LOW (ref 36.0–46.0)
Hemoglobin: 11.3 g/dL — ABNORMAL LOW (ref 12.0–15.0)
Lymphocytes Relative: 5 % — ABNORMAL LOW (ref 12–46)
Lymphs Abs: 1 10*3/uL (ref 0.7–4.0)
MCH: 30.5 pg (ref 26.0–34.0)
MCHC: 33.9 g/dL (ref 30.0–36.0)
MCV: 90 fL (ref 78.0–100.0)
Monocytes Absolute: 1.4 10*3/uL — ABNORMAL HIGH (ref 0.1–1.0)
Monocytes Relative: 7 % (ref 3–12)
Neutro Abs: 18 10*3/uL — ABNORMAL HIGH (ref 1.7–7.7)
Neutrophils Relative %: 88 % — ABNORMAL HIGH (ref 43–77)
Platelets: 237 10*3/uL (ref 150–400)
RBC: 3.7 MIL/uL — ABNORMAL LOW (ref 3.87–5.11)
RDW: 13.5 % (ref 11.5–15.5)
WBC: 20.5 10*3/uL — ABNORMAL HIGH (ref 4.0–10.5)

## 2014-09-16 LAB — COMPREHENSIVE METABOLIC PANEL WITH GFR
ALT: 13 U/L (ref 0–35)
AST: 14 U/L (ref 0–37)
Albumin: 3.4 g/dL — ABNORMAL LOW (ref 3.5–5.2)
Alkaline Phosphatase: 107 U/L (ref 39–117)
Anion gap: 11 (ref 5–15)
BUN: 20 mg/dL (ref 6–23)
CO2: 25 meq/L (ref 19–32)
Calcium: 9.5 mg/dL (ref 8.4–10.5)
Chloride: 100 meq/L (ref 96–112)
Creatinine, Ser: 0.91 mg/dL (ref 0.50–1.10)
GFR calc Af Amer: 68 mL/min — ABNORMAL LOW
GFR calc non Af Amer: 58 mL/min — ABNORMAL LOW
Glucose, Bld: 115 mg/dL — ABNORMAL HIGH (ref 70–99)
Potassium: 4 meq/L (ref 3.7–5.3)
Sodium: 136 meq/L — ABNORMAL LOW (ref 137–147)
Total Bilirubin: 1.2 mg/dL (ref 0.3–1.2)
Total Protein: 7.2 g/dL (ref 6.0–8.3)

## 2014-09-16 LAB — CBG MONITORING, ED: Glucose-Capillary: 123 mg/dL — ABNORMAL HIGH (ref 70–99)

## 2014-09-16 LAB — SEDIMENTATION RATE: Sed Rate: 66 mm/h — ABNORMAL HIGH (ref 0–22)

## 2014-09-16 LAB — LACTIC ACID, PLASMA: Lactic Acid, Venous: 1.2 mmol/L (ref 0.5–2.2)

## 2014-09-16 MED ORDER — MORPHINE SULFATE 2 MG/ML IJ SOLN
1.0000 mg | INTRAMUSCULAR | Status: DC | PRN
  Administered 2014-09-20: 1 mg via INTRAVENOUS
  Filled 2014-09-16: qty 1

## 2014-09-16 MED ORDER — ACETAMINOPHEN 325 MG PO TABS
650.0000 mg | ORAL_TABLET | Freq: Four times a day (QID) | ORAL | Status: DC | PRN
  Administered 2014-09-16 – 2014-09-17 (×2): 650 mg via ORAL
  Filled 2014-09-16 (×3): qty 2

## 2014-09-16 MED ORDER — LATANOPROST 0.005 % OP SOLN
1.0000 [drp] | Freq: Every day | OPHTHALMIC | Status: DC
  Administered 2014-09-16 – 2014-09-21 (×6): 1 [drp] via OPHTHALMIC
  Filled 2014-09-16: qty 2.5

## 2014-09-16 MED ORDER — ONDANSETRON HCL 4 MG PO TABS: 4.0000 mg | ORAL_TABLET | Freq: Four times a day (QID) | ORAL | Status: DC | PRN

## 2014-09-16 MED ORDER — SODIUM CHLORIDE 0.9 % IV BOLUS (SEPSIS)
1000.0000 mL | Freq: Once | INTRAVENOUS | Status: AC
  Administered 2014-09-16: 1000 mL via INTRAVENOUS

## 2014-09-16 MED ORDER — POLYVINYL ALCOHOL 1.4 % OP SOLN: 1.0000 [drp] | Freq: Three times a day (TID) | OPHTHALMIC | Status: DC | PRN

## 2014-09-16 MED ORDER — ATENOLOL 25 MG PO TABS
100.0000 mg | ORAL_TABLET | Freq: Every day | ORAL | Status: DC
  Administered 2014-09-16 – 2014-09-17 (×2): 100 mg via ORAL
  Filled 2014-09-16 (×2): qty 4

## 2014-09-16 MED ORDER — FAMOTIDINE 20 MG PO TABS
20.0000 mg | ORAL_TABLET | Freq: Two times a day (BID) | ORAL | Status: DC
  Administered 2014-09-16 – 2014-09-22 (×11): 20 mg via ORAL
  Filled 2014-09-16 (×11): qty 1

## 2014-09-16 MED ORDER — ONDANSETRON HCL 4 MG/2ML IJ SOLN: 4.0000 mg | Freq: Four times a day (QID) | INTRAMUSCULAR | Status: DC | PRN

## 2014-09-16 MED ORDER — SIMVASTATIN 10 MG PO TABS
10.0000 mg | ORAL_TABLET | Freq: Every day | ORAL | Status: DC
  Administered 2014-09-16 – 2014-09-21 (×6): 10 mg via ORAL
  Filled 2014-09-16 (×6): qty 1

## 2014-09-16 MED ORDER — SODIUM CHLORIDE 0.9 % IV SOLN: INTRAVENOUS | Status: AC

## 2014-09-16 MED ORDER — SERTRALINE HCL 50 MG PO TABS
100.0000 mg | ORAL_TABLET | Freq: Every day | ORAL | Status: DC
  Administered 2014-09-16 – 2014-09-22 (×5): 100 mg via ORAL
  Filled 2014-09-16 (×5): qty 2

## 2014-09-16 MED ORDER — ACETAMINOPHEN 650 MG RE SUPP: 650.0000 mg | Freq: Four times a day (QID) | RECTAL | Status: DC | PRN

## 2014-09-16 MED ORDER — DOCUSATE SODIUM 100 MG PO CAPS
100.0000 mg | ORAL_CAPSULE | Freq: Two times a day (BID) | ORAL | Status: DC
  Administered 2014-09-16 – 2014-09-22 (×11): 100 mg via ORAL
  Filled 2014-09-16 (×11): qty 1

## 2014-09-16 MED ORDER — PIPERACILLIN-TAZOBACTAM 3.375 G IVPB
3.3750 g | Freq: Three times a day (TID) | INTRAVENOUS | Status: DC
  Administered 2014-09-16 – 2014-09-22 (×18): 3.375 g via INTRAVENOUS
  Filled 2014-09-16 (×24): qty 50

## 2014-09-16 MED ORDER — VANCOMYCIN HCL 10 G IV SOLR
1250.0000 mg | INTRAVENOUS | Status: AC
  Administered 2014-09-16 – 2014-09-19 (×4): 1250 mg via INTRAVENOUS
  Filled 2014-09-16 (×5): qty 1250

## 2014-09-16 MED ORDER — ALBUTEROL SULFATE (2.5 MG/3ML) 0.083% IN NEBU: 2.5000 mg | INHALATION_SOLUTION | RESPIRATORY_TRACT | Status: DC | PRN

## 2014-09-16 MED ORDER — TRAMADOL HCL 50 MG PO TABS
50.0000 mg | ORAL_TABLET | Freq: Four times a day (QID) | ORAL | Status: DC | PRN
  Administered 2014-09-18 – 2014-09-19 (×3): 50 mg via ORAL
  Administered 2014-09-19 – 2014-09-21 (×5): 100 mg via ORAL
  Filled 2014-09-16 (×3): qty 2
  Filled 2014-09-16: qty 1
  Filled 2014-09-16 (×2): qty 2
  Filled 2014-09-16 (×2): qty 1

## 2014-09-16 NOTE — Progress Notes (Signed)
ANTIBIOTIC CONSULT NOTE - INITIAL  Pharmacy Consult for vancomycin & Zosyn Indication: septic arthritis / osteomyelitis of left foot great toe  Allergies  Allergen Reactions  . Codeine Shortness Of Breath and Nausea And Vomiting  . Hydrocodone Shortness Of Breath and Nausea And Vomiting    Patient Measurements: Height: 5\' 8"  (172.7 cm) Weight: 179 lb 4.8 oz (81.33 kg) IBW/kg (Calculated) : 63.9 Adjusted Body Weight: 70 kg  Vital Signs: Temp: 98.2 F (36.8 C) (10/15 0306) Temp Source: Oral (10/15 0306) BP: 107/50 mmHg (10/15 0306) Pulse Rate: 71 (10/15 0306) Intake/Output from previous day:   Intake/Output from this shift:    Labs:  Recent Labs  09/15/14 2345  WBC 20.5*  HGB 11.3*  PLT 237  CREATININE 0.91   Estimated Creatinine Clearance: 56.1 ml/min (by C-G formula based on Cr of 0.91). No results found for this basename: VANCOTROUGH, VANCOPEAK, VANCORANDOM, GENTTROUGH, GENTPEAK, GENTRANDOM, TOBRATROUGH, TOBRAPEAK, TOBRARND, AMIKACINPEAK, AMIKACINTROU, AMIKACIN,  in the last 72 hours   Microbiology: No results found for this or any previous visit (from the past 720 hour(s)).  Medical History: Past Medical History  Diagnosis Date  . Hypertension   . Arthritis   . Depression   . Hyperlipidemia   . PFO (patent foramen ovale)     small  . H/O echocardiogram 2008    prolapse of post. mitral leaflet, tr MR, tr TR, EF >55%, mild concentric LVH  . H/O cardiovascular stress test 10/13/2007    low risk scan, no changes from previous test  . Heart palpitations     controlled on BB  . Hiatal hernia     Medications:  Scheduled:  . atenolol  100 mg Oral Daily  . docusate sodium  100 mg Oral BID  . famotidine  20 mg Oral BID  . latanoprost  1 drop Both Eyes QHS  . piperacillin-tazobactam (ZOSYN)  IV  3.375 g Intravenous Q8H  . sertraline  100 mg Oral Daily  . simvastatin  10 mg Oral QHS  . vancomycin  1,250 mg Intravenous Q24H   Infusions:  . sodium  chloride     PRN: acetaminophen, acetaminophen, albuterol, morphine injection, ondansetron (ZOFRAN) IV, ondansetron, polyvinyl alcohol, traMADol  Assessment: [age]yr female with R/O osteomyelitis / septic arthritis of left great toe (consult requests were filled out for cellulitis).  Single doses of vancomycin 1gm and Zosyn 3.375gm given approx 0100 tonight  Goal of Therapy:  Desire vancomycin trough serum level to be 12-6715mcg/ml.  Will utilize standard Zosyn regimen since CrCl>4720ml/min  Plan:  1.  Vancomycin 1250mg  IV q24h 2.  Zosyn 3.375gm IV q8h (4hr infusions) 3.  Monitor for indices of infection and renal function 4.  Measure actual steady state trough serum vancomycin level as clinically indicated.  Thayden Lemire, Lloyd HugerNeil E 09/16/2014,3:08 AM

## 2014-09-16 NOTE — Plan of Care (Signed)
Problem: Phase I Progression Outcomes Goal: Pain controlled with appropriate interventions Outcome: Completed/Met Date Met:  09/16/14 Patient denies pain at this time.

## 2014-09-16 NOTE — Progress Notes (Signed)
UR chart review completed.  

## 2014-09-16 NOTE — ED Notes (Signed)
Dr. Lynelle DoctorKnapp stated to leave wound on left great toe uncovered.

## 2014-09-16 NOTE — Consult Note (Signed)
Podiatry Consult Note  Reason for Consultation:  Septic joint left great toe.  History of Present Illness: Cassandra [name] is a 80 year old female who is well-known to me from my private office.  She has been followed for wounds of her right foot which continue to improve.  Her course has been uneventful until now.  On Tuesday, she presented with a red, hot, edematous left great toe.  She would never say exactly when the problem began.  She denied any trauma or inciting event.  Radiographs were performed which revealed increase in the interphalangeal joint space with osseous changes of the distal phalanx.  No well-defined erosive changes of the distal phalanx were visible.  However, given the appearance of the toe there was concern for osteomyelitis.  An MRI with contrast was ordered and performed on 09/15/2014.  Her dressing was removed last night and she has worsening in the appearance of her toe.  She went to the emergency room for evaluation and was admitted.  owever, she presented to my office on Tuesday with recent onset   Past Medical History  Diagnosis Date  . Hypertension   . Arthritis   . Depression   . Hyperlipidemia   . PFO (patent foramen ovale)     small  . H/O echocardiogram 2008    prolapse of post. mitral leaflet, tr MR, tr TR, EF >55%, mild concentric LVH  . H/O cardiovascular stress test 10/13/2007    low risk scan, no changes from previous test  . Heart palpitations     controlled on BB  . Hiatal hernia    Scheduled Meds: . atenolol  100 mg Oral Daily  . docusate sodium  100 mg Oral BID  . famotidine  20 mg Oral BID  . latanoprost  1 drop Both Eyes QHS  . piperacillin-tazobactam (ZOSYN)  IV  3.375 g Intravenous Q8H  . sertraline  100 mg Oral Daily  . simvastatin  10 mg Oral QHS  . vancomycin  1,250 mg Intravenous Q24H   Continuous Infusions:  PRN Meds:.acetaminophen, acetaminophen, albuterol, morphine injection, ondansetron (ZOFRAN) IV, ondansetron, polyvinyl  alcohol, traMADol  Allergies  Allergen Reactions  . Codeine Shortness Of Breath and Nausea And Vomiting  . Hydrocodone Shortness Of Breath and Nausea And Vomiting   Past Surgical History  Procedure Laterality Date  . Cholecystectomy    . Cardiac catheterization  10/2002    normal coronary arteries, normal LV function  . Incision and drainage Right 07/21/2014    Procedure: INCISION AND DRAINAGE;  Surgeon: Dallas SchimkeBenjamin Ivan Alonia Dibuono, DPM;  Location: [facility];  Service: Podiatry;  Laterality: Right;   Family History  Problem Relation Age of Onset  . Heart disease Mother   . Cancer Father   . Kidney disease Brother   . Hypertension Daughter   . Hyperlipidemia Son    Social History:  reports that she has never smoked. She has never used smokeless tobacco. She reports that she does not drink alcohol or use illicit drugs.  Review of Systems: Significant for skin changes and edema of the left great toe.  Significant for fever and chills.  She denies any nausea or vomiting.  Physical Examination: Vital signs in last 24 hours:   Temp:  [98.2 F (36.8 C)-99.3 F (37.4 C)] 99.3 F (37.4 C) (10/15 1344) Pulse Rate:  [71-87] 84 (10/15 1344) Resp:  [18-20] 18 (10/15 1344) BP: (106-122)/(48-61) 122/55 mmHg (10/15 1344) SpO2:  [93 %-100 %] 93 % (10/15 1344) Weight:  [175 lb (  79.379 kg)-179 lb 4.8 oz (81.33 kg)] 179 lb 4.8 oz (81.33 kg) (10/15 0306)  Right foot:  A dressing is intact.  There is a full-thickness wound present along the medial aspect of the right first metatarsophalangeal joint.  The wound bed is granular.  There are no acute signs of infection present.  There is a full-thickness ulceration present along the distal aspect of the great toe.  The ulceration is granular.  There are no acute signs of infection present.  Dorsalis pedis pulse is palpable.  Posterior tibial pulse is palpable. The calf is soft and nontender.  Left foot:  No dressing is in place.  The appearance of the left  great toe looked significantly worse from Tuesday.  The left great toe is red, warm and edematous.  There is an oval area of dark discoloration along the medial aspect of the left great toe with underlying fluctuance consistent with an abscess.  The left great toe is tender to palpation.  Dorsalis pedis pulse is palpable.  Posterior tibial pulses palpable.  There is edema of the right foot and lower leg.  The calf is soft and nontender.  Lab/Test Results:   Recent Labs  09/15/14 2345  WBC 20.5*  HGB 11.3*  HCT 33.3*  PLT 237  NA 136*  K 4.0  CL 100  CO2 25  BUN 20  CREATININE 0.91  GLUCOSE 115*  CALCIUM 9.5    No results found for this or any previous visit (from the past 240 hour(s)).   Mr Foot Left W Wo Contrast  09/16/2014   CLINICAL DATA:  Great toe pain and swelling for 1 week. Toenail fell off 1 year ago. Chronic drainage. SAPHO syndrome.  EXAM: MRI OF THE LEFT FOREFOOT WITHOUT AND WITH CONTRAST  TECHNIQUE: Multiplanar, multisequence MR imaging was performed both before and after administration of intravenous contrast.  CONTRAST:  15mL MULTIHANCE GADOBENATE DIMEGLUMINE 529 MG/ML IV SOLN  COMPARISON:  09/16/2014 radiographs  FINDINGS: Articular erosion of the base of the distal phalanx great toe all with abnormal soft tissue enhancement, abnormal fluid in the interphalangeal joint, and fluid extension from the joint especially medially and in the plantar region forming a cavity suspicious for abscess but possibly draining to the medial scan. Abnormal erosion and irregularity of the distal articular surface of the proximal phalanx with edema signal and enhancement in the proximal phalanx and surrounding soft tissues.  Low grade edema tracks along the plantar musculature of the foot. Lisfranc ligament intact and Lisfranc alignment normal. The remaining toes and bony structures appear intact.  IMPRESSION: 1. Suspected septic arthritis of the interphalangeal joint with extensive  extracapsular surrounding fluid collection compatible with abscess tracking in the toe and possibly draining to the medial skin. Surrounding soft tissue enhancement and bony destructive findings along the interphalangeal joint. The bony findings are clearly worse compared to the prior CT scan of 07/18/2014. This is not a typical joint to be involved in SAPHO syndrome, and the patient's age makes chronic recurrent multifocal osteomyelitis (CRMO) unlikely. Gout arthropathy is considered unlikely given the degree of fluid collections. The joint should probably be drained.   Electronically Signed   By: Herbie Baltimore M.D.   On: 09/16/2014 09:02   Dg Toe Great Left  09/16/2014   CLINICAL DATA:  Left great toe swelling and drainage. Initial encounter.  EXAM: LEFT GREAT TOE  COMPARISON:  Left forefoot MRI - 09/15/2014  FINDINGS: There is diffuse soft tissue swelling about the great toe.  This finding is associated with osteolysis involving the base of the distal phalanx of the great toe and deformity of the IP joint. No definite fracture or dislocation. No radiopaque foreign body.  Remaining joint spaces appear preserved.  IMPRESSION: Findings worrisome for septic arthritis involving the IP joint of the great toe with osteomyelitis involving at least the base of the distal phalanx of the great toe. Correlation with MRI performed 09/15/2014 is recommended.   Electronically Signed   By: Simonne ComeJohn  Watts M.D.   On: 09/16/2014 01:13    Assessment: 1.  Cellulitis of the left hallux. 2.  Septic joint of the interphalangeal joint of the left hallux. 3.  Concern for abscess of the left hallux. 4.  Concern for underlying osteomyelitis of the distal phalanx of the left hallux. 5.  Stable wound an ulceration of the right hallux.  Plan: The exam findings, diagnoses and treatment options were explained to the patient.  Given the clinical, radiographic and MRI findings, I recommend incision and drainage of the left hallux  with biopsy of the distal phalanx.  I have explained to the patient that she is at risk for partial or total amputation of the left great toe.  She understands this.  The planned surgical procedure was explained to her.  The benefits and risk of the procedure were explained.  All questions were answered.  No guarantees were given.  She has been placed nothing by mouth after midnight.  Surgery is scheduled for tomorrow at 8:45 AM.  Dallas SchimkeMCKINNEY,Goldy Calandra IVAN 09/16/2014, 5:24 PM

## 2014-09-16 NOTE — Care Management Note (Addendum)
    Page 1 of 1   09/22/2014     4:05:56 PM CARE MANAGEMENT NOTE 09/22/2014  Patient:  Cassandra KeensDAVENPORT,Cassandra R   Account Number:  [id number]  Date Initiated:  09/16/2014  Documentation initiated by:  Sharrie RothmanBLACKWELL,Cochise Dinneen C  Subjective/Objective Assessment:   Pt admitted from home with septic arthritis/osteomylitis of left great toe. Pt lives with her husband and will return home at discharge. Pt was active with [facility] in the recent past and has a rolling walker.     Action/Plan:   Will continue to follow for discharge planning needs.   Anticipated DC Date:  09/20/2014   Anticipated DC Plan:  HOME W HOME HEALTH SERVICES      DC Planning Services  CM consult      Choice offered to / List presented to:             Status of service:  Completed, signed off Medicare Important Message given?  YES (If response is "NO", the following Medicare IM given date fields will be blank) Date Medicare IM given:  09/17/2014 Medicare IM given by:  Arlyss QueenBLACKWELL,Alexza Norbeck C Date Additional Medicare IM given:  09/22/2014 Additional Medicare IM given by:  Sharrie RothmanAMMY C Albirtha Grinage  Discharge Disposition:  SKILLED NURSING FACILITY  Per UR Regulation:    If discussed at Long Length of Stay Meetings, dates discussed:    Comments:  09/22/14 1605 Arlyss Queenammy Prerna Harold, RN BNS CM Pt discharged to [facility] today. CSW to arrange discharge to facility.  09/17/14 1405 Arlyss Queenammy Omesha Bowerman, RN BSN CM Pt having surgery today. Will continue to follow for discharge planning needs. ? need for IV AB at discharge.  09/16/14 1455 Arlyss Queenammy Jinger Middlesworth, RN BSN CM

## 2014-09-16 NOTE — H&P (Signed)
[facility] History and Physical  NICLOE FRONTERA OXB:[id number] DOB: [birth year] DOA: 09/15/2014   PCP: Purvis Kilts, MD  Specialists: Dr. Caprice Beaver is her podiatrist  Chief Complaint:  Swelling and pain in the left first toe  HPI: Cassandra [name] is a 80 y.o. female with a past medical history of hypertension, hypercholesterolemia, who underwent incision and drainage of an abscess in the right foot in August. She went for followup with her podiatrist yesterday and was noted to have swelling of the left first toe. Patient is somewhat vague regarding the time of onset. She doesn't answer specifically. She tells me that all of a sudden yesterday her toe became worse. Dr. Caprice Beaver, apparently obtain a x-ray in his office and referred her for MRI. And, then because her toe was swelling up she decided to come in to the hospital. She has some pain in that toe, which is worse with walking. She is unable to quantify the pain. It's a dull pain. She had fever with a temperature up to 99.7, with some chills. No radiation of the pain. Denies any other symptoms at this time. She is worried about losing that toe. History is somewhat limited.  Home Medications: Prior to Admission medications   Medication Sig Start Date End Date Taking? Authorizing Provider  aspirin 81 MG chewable tablet Chew 81 mg by mouth daily.   Yes Historical Provider, MD  atenolol (TENORMIN) 100 MG tablet Take 1 tablet by mouth daily. 08/26/13  Yes Historical Provider, MD  Biotin 5000 MCG CAPS Take 10,000 mcg by mouth daily.    Yes Historical Provider, MD  Calcium Carbonate-Vitamin D (CALCIUM + D) 600-200 MG-UNIT TABS Take 1 tablet by mouth daily.   Yes Historical Provider, MD  Coenzyme Q10 (CO Q 10) 10 MG CAPS Take 10 mg by mouth daily.    Yes Historical Provider, MD  latanoprost (XALATAN) 0.005 % ophthalmic solution Place 1 drop into both eyes at bedtime.   Yes Historical Provider, MD  Multiple Vitamin (MULTIVITAMIN WITH  MINERALS) TABS tablet Take 2 tablets by mouth daily.   Yes Historical Provider, MD  ranitidine (ZANTAC) 150 MG tablet Take 150 mg by mouth 2 (two) times daily.   Yes Historical Provider, MD  sertraline (ZOLOFT) 100 MG tablet Take 100 mg by mouth daily.  08/11/13  Yes Historical Provider, MD  silver sulfADIAZINE (SILVADENE) 1 % cream Apply 1 application topically 2 (two) times daily. 08/31/14  Yes Historical Provider, MD  SIMBRINZA 1-0.2 % SUSP Place 1 drop into both eyes 3 (three) times daily. 06/14/14  Yes Historical Provider, MD  simvastatin (ZOCOR) 10 MG tablet Take 10 mg by mouth at bedtime.   Yes Historical Provider, MD  carboxymethylcellulose (REFRESH PLUS) 0.5 % SOLN Place 1 drop into both eyes 3 (three) times daily as needed (Dry Eyes).    Historical Provider, MD  Cyanocobalamin (VITAMIN B-12 IJ) Inject as directed every 30 (thirty) days.    Historical Provider, MD  naproxen (NAPROSYN) 500 MG tablet Take 500 mg by mouth daily. For pain    Historical Provider, MD  traMADol (ULTRAM) 50 MG tablet Take 50-100 mg by mouth every 6 (six) hours as needed. For pain    Historical Provider, MD    Allergies:  Allergies  Allergen Reactions  . Codeine Shortness Of Breath and Nausea And Vomiting  . Hydrocodone Shortness Of Breath and Nausea And Vomiting    Past Medical History: Past Medical History  Diagnosis Date  . Hypertension   .  Arthritis   . Depression   . Hyperlipidemia   . PFO (patent foramen ovale)     small  . H/O echocardiogram 2008    prolapse of post. mitral leaflet, tr MR, tr TR, EF >55%, mild concentric LVH  . H/O cardiovascular stress test 10/13/2007    low risk scan, no changes from previous test  . Heart palpitations     controlled on BB  . Hiatal hernia     Past Surgical History  Procedure Laterality Date  . Cholecystectomy    . Cardiac catheterization  10/2002    normal coronary arteries, normal LV function  . Incision and drainage Right 07/21/2014    Procedure:  INCISION AND DRAINAGE;  Surgeon: Marcheta Grammes, DPM;  Location: [facility];  Service: Podiatry;  Laterality: Right;    Social History: She lives in [place] with her husband. Denies smoking, alcohol use or illicit drug use. Usually independent with daily activities.  Family History:  Family History  Problem Relation Age of Onset  . Heart disease Mother   . Cancer Father   . Kidney disease Brother   . Hypertension Daughter   . Hyperlipidemia Son      Review of Systems - History obtained from the patient General ROS: negative Psychological ROS: negative Ophthalmic ROS: negative ENT ROS: negative Allergy and Immunology ROS: negative Hematological and Lymphatic ROS: negative Endocrine ROS: negative Respiratory ROS: no cough, shortness of breath, or wheezing Cardiovascular ROS: no chest pain or dyspnea on exertion Gastrointestinal ROS: no abdominal pain, change in bowel habits, or black or bloody stools Genito-Urinary ROS: no dysuria, trouble voiding, or hematuria Musculoskeletal ROS: as in hpi Neurological ROS: no TIA or stroke symptoms Dermatological ROS: redness of left toe and foot  Physical Examination  Filed Vitals:   09/15/14 2200 09/16/14 0003 09/16/14 0022 09/16/14 0130  BP: 106/53 113/61 113/61 109/57  Pulse: 80 81 80 80  Temp:   99 F (37.2 C)   TempSrc:   Oral   Resp:      Height:      Weight:      SpO2:   98%     BP 109/57  Pulse 80  Temp(Src) 99 F (37.2 C) (Oral)  Resp 20  Ht _0  (1.727 m)  Wt 79.379 kg (175 lb)  BMI 26.61 kg/m2  SpO2 98%  General appearance: alert, cooperative, appears stated age and no distress Head: Normocephalic, without obvious abnormality, atraumatic Eyes: conjunctivae/corneas clear. PERRL, EOM's intact.  Throat: lips, mucosa, and tongue normal; teeth and gums normal Neck: no adenopathy, no carotid bruit, no JVD, supple, symmetrical, trachea midline and thyroid not enlarged, symmetric, no  tenderness/mass/nodules Resp: clear to auscultation bilaterally Cardio: regular rate and rhythm, S1, S2 normal, no murmur, click, rub or gallop GI: soft, non-tender; bowel sounds normal; no masses,  no organomegaly Extremities: Swelling of the left first toe. Tender. Limited range of motion. Pulses: 2+ and symmetric Skin: Erythema with draining wound over the left first toe Neurologic: Alert and oriented x3. No focal neurological deficits are noted.  Laboratory Data: Results for orders placed during the hospital encounter of 09/15/14 (from the past 48 hour(s))  CBC WITH DIFFERENTIAL     Status: Abnormal   Collection Time    09/15/14 11:45 PM      Result Value Ref Range   WBC 20.5 (*) 4.0 - 10.5 K/uL   RBC 3.70 (*) 3.87 - 5.11 MIL/uL   Hemoglobin 11.3 (*) 12.0 - 15.0 g/dL  HCT 33.3 (*) 36.0 - 46.0 %   MCV 90.0  78.0 - 100.0 fL   MCH 30.5  26.0 - 34.0 pg   MCHC 33.9  30.0 - 36.0 g/dL   RDW 13.5  11.5 - 15.5 %   Platelets 237  150 - 400 K/uL   Neutrophils Relative % 88 (*) 43 - 77 %   Neutro Abs 18.0 (*) 1.7 - 7.7 K/uL   Lymphocytes Relative 5 (*) 12 - 46 %   Lymphs Abs 1.0  0.7 - 4.0 K/uL   Monocytes Relative 7  3 - 12 %   Monocytes Absolute 1.4 (*) 0.1 - 1.0 K/uL   Eosinophils Relative 0  0 - 5 %   Eosinophils Absolute 0.0  0.0 - 0.7 K/uL   Basophils Relative 0  0 - 1 %   Basophils Absolute 0.0  0.0 - 0.1 K/uL  COMPREHENSIVE METABOLIC PANEL     Status: Abnormal   Collection Time    09/15/14 11:45 PM      Result Value Ref Range   Sodium 136 (*) 137 - 147 mEq/L   Potassium 4.0  3.7 - 5.3 mEq/L   Chloride 100  96 - 112 mEq/L   CO2 25  19 - 32 mEq/L   Glucose, Bld 115 (*) 70 - 99 mg/dL   BUN 20  6 - 23 mg/dL   Creatinine, Ser 0.91  0.50 - 1.10 mg/dL   Calcium 9.5  8.4 - 10.5 mg/dL   Total Protein 7.2  6.0 - 8.3 g/dL   Albumin 3.4 (*) 3.5 - 5.2 g/dL   AST 14  0 - 37 U/L   ALT 13  0 - 35 U/L   Alkaline Phosphatase 107  39 - 117 U/L   Total Bilirubin 1.2  0.3 - 1.2 mg/dL    GFR calc non Af Amer 58 (*) >90 mL/min   GFR calc Af Amer 68 (*) >90 mL/min   Comment: (NOTE)     The eGFR has been calculated using the CKD EPI equation.     This calculation has not been validated in all clinical situations.     eGFR's persistently <90 mL/min signify possible Chronic Kidney     Disease.   Anion gap 11  5 - 15  SEDIMENTATION RATE     Status: Abnormal   Collection Time    09/15/14 11:45 PM      Result Value Ref Range   Sed Rate 66 (*) 0 - 22 mm/hr  LACTIC ACID, PLASMA     Status: None   Collection Time    09/15/14 11:45 PM      Result Value Ref Range   Lactic Acid, Venous 1.2  0.5 - 2.2 mmol/L  CBG MONITORING, ED     Status: Abnormal   Collection Time    09/16/14  1:10 AM      Result Value Ref Range   Glucose-Capillary 123 (*) 70 - 99 mg/dL    Radiology Reports: Dg Toe Great Left  09/16/2014   CLINICAL DATA:  Left great toe swelling and drainage. Initial encounter.  EXAM: LEFT GREAT TOE  COMPARISON:  Left forefoot MRI - 09/15/2014  FINDINGS: There is diffuse soft tissue swelling about the great toe. This finding is associated with osteolysis involving the base of the distal phalanx of the great toe and deformity of the IP joint. No definite fracture or dislocation. No radiopaque foreign body.  Remaining joint spaces appear preserved.  IMPRESSION: Findings  worrisome for septic arthritis involving the IP joint of the great toe with osteomyelitis involving at least the base of the distal phalanx of the great toe. Correlation with MRI performed 09/15/2014 is recommended.   Electronically Signed   By: Sandi Mariscal M.D.   On: 09/16/2014 01:13    Problem List  Principal Problem:   Septic arthritis of interphalangeal joint of toe of left foot Active Problems:   Hypertension   Cellulitis of toe, left   Assessment: This is a 80 year old, Caucasian female, who presents with pain and swelling of the left first toe. She has cellulitis of the toe and evidence for possible  septic arthritis and osteomyelitis. MRI was done last night however report is not available.   Plan: #1 septic arthritis/osteomyelitis involving the first toe on the left: Her white count is significantly elevated. Lactic acid is normal. She is hemodynamically stable. We'll get her admitted and started on intravenous vancomycin and Zosyn. We'll consult Dr. Caprice Beaver, as she most likely will end up requiring surgical intervention. Keep her n.p.o. for now. Pain control. Follow up on MRI report in the morning.  #2 history of glaucoma: Continue with eye drops.  #3 history of hypertension, and palpitations: Continue with atenolol.   DVT Prophylaxis: SCDs for now Code Status: Full code Family Communication: Discussed with the patient  Disposition Plan: Admit to MedSurg   Further management decisions will depend on results of further testing and patient's response to treatment.   [facility]  [facility] Pager [phone]  If 7PM-7AM, please contact night-coverage [email or web address] Password [email or web address]  09/16/2014, 2:07 AM

## 2014-09-16 NOTE — Progress Notes (Signed)
The patient is a 80 year old woman with a history of hyperlipidemia, hypertension, and abscess of her right foot, who was admitted by Dr. Rito EhrlichKrishnan on this morning for swelling and pain in her left great toe. Radiographic studies with an x-ray and MRI revealed suspected septic arthritis of the first interphalangeal joint, query multifocal osteomyelitis versus gout arthropathy. The patient was briefly seen and examined. Her chart, vitals signs, and laboratory studies were reviewed. We'll continue current management, but will discontinue n.p.o. status until her podiatrist, Dr. Nolen MuMcKinney, sees her. He has been notified of her admission.

## 2014-09-17 ENCOUNTER — Inpatient Hospital Stay (HOSPITAL_COMMUNITY): Payer: Medicare Other | Admitting: Anesthesiology

## 2014-09-17 ENCOUNTER — Encounter (HOSPITAL_COMMUNITY)
Admission: EM | Disposition: A | Discharge status: Skilled Nursing Facility | Payer: Self-pay | Source: Home / Self Care | Attending: Internal Medicine

## 2014-09-17 ENCOUNTER — Encounter (HOSPITAL_COMMUNITY): Payer: Self-pay | Admitting: Internal Medicine

## 2014-09-17 ENCOUNTER — Encounter (HOSPITAL_COMMUNITY): Payer: Medicare Other | Admitting: Anesthesiology

## 2014-09-17 ENCOUNTER — Inpatient Hospital Stay (HOSPITAL_COMMUNITY): Payer: Medicare Other

## 2014-09-17 DIAGNOSIS — D649 Anemia, unspecified: Secondary | ICD-10-CM | POA: Diagnosis present

## 2014-09-17 DIAGNOSIS — L02612 Cutaneous abscess of left foot: Secondary | ICD-10-CM

## 2014-09-17 HISTORY — PX: INCISION AND DRAINAGE: SHX5863

## 2014-09-17 HISTORY — PX: BONE BIOPSY: SHX375

## 2014-09-17 LAB — COMPREHENSIVE METABOLIC PANEL
ALK PHOS: 104 U/L (ref 39–117)
ALT: 11 U/L (ref 0–35)
ANION GAP: 10 (ref 5–15)
AST: 13 U/L (ref 0–37)
Albumin: 2.6 g/dL — ABNORMAL LOW (ref 3.5–5.2)
BUN: 16 mg/dL (ref 6–23)
CALCIUM: 8.7 mg/dL (ref 8.4–10.5)
CO2: 25 meq/L (ref 19–32)
Chloride: 107 mEq/L (ref 96–112)
Creatinine, Ser: 0.93 mg/dL (ref 0.50–1.10)
GFR calc Af Amer: 66 mL/min — ABNORMAL LOW (ref >90)
GFR, EST NON AFRICAN AMERICAN: 57 mL/min — AB (ref >90)
GLUCOSE: 105 mg/dL — AB (ref 70–99)
POTASSIUM: 3.7 meq/L (ref 3.7–5.3)
SODIUM: 142 meq/L (ref 137–147)
TOTAL PROTEIN: 6.3 g/dL (ref 6.0–8.3)
Total Bilirubin: 0.7 mg/dL (ref 0.3–1.2)

## 2014-09-17 LAB — CBC
HCT: 29.8 % — ABNORMAL LOW (ref 36.0–46.0)
Hemoglobin: 9.9 g/dL — ABNORMAL LOW (ref 12.0–15.0)
MCH: 30.3 pg (ref 26.0–34.0)
MCHC: 33.2 g/dL (ref 30.0–36.0)
MCV: 91.1 fL (ref 78.0–100.0)
PLATELETS: 217 10*3/uL (ref 150–400)
RBC: 3.27 MIL/uL — ABNORMAL LOW (ref 3.87–5.11)
RDW: 13.8 % (ref 11.5–15.5)
WBC: 12.4 10*3/uL — ABNORMAL HIGH (ref 4.0–10.5)

## 2014-09-17 LAB — SURGICAL PCR SCREEN
MRSA, PCR: NEGATIVE
STAPHYLOCOCCUS AUREUS: POSITIVE — AB

## 2014-09-17 SURGERY — INCISION AND DRAINAGE
Anesthesia: Monitor Anesthesia Care | Site: Toe | Laterality: Left

## 2014-09-17 MED ORDER — PROPOFOL 10 MG/ML IV BOLUS
INTRAVENOUS | Status: AC
  Filled 2014-09-17: qty 20

## 2014-09-17 MED ORDER — ATENOLOL 25 MG PO TABS
50.0000 mg | ORAL_TABLET | Freq: Every day | ORAL | Status: DC
  Administered 2014-09-18 – 2014-09-21 (×3): 50 mg via ORAL
  Filled 2014-09-17 (×3): qty 2
  Filled 2014-09-17: qty 1

## 2014-09-17 MED ORDER — ONDANSETRON HCL 4 MG/2ML IJ SOLN: 4.0000 mg | Freq: Once | INTRAMUSCULAR | Status: DC | PRN

## 2014-09-17 MED ORDER — MIDAZOLAM HCL 5 MG/5ML IJ SOLN
INTRAMUSCULAR | Status: DC | PRN
  Administered 2014-09-17: 1 mg via INTRAVENOUS

## 2014-09-17 MED ORDER — LACTATED RINGERS IV SOLN
INTRAVENOUS | Status: DC
  Administered 2014-09-17: 09:00:00 via INTRAVENOUS

## 2014-09-17 MED ORDER — MIDAZOLAM HCL 2 MG/2ML IJ SOLN
1.0000 mg | INTRAMUSCULAR | Status: DC | PRN
  Administered 2014-09-17: 2 mg via INTRAVENOUS
  Filled 2014-09-17: qty 2

## 2014-09-17 MED ORDER — FENTANYL CITRATE 0.05 MG/ML IJ SOLN: 25.0000 ug | INTRAMUSCULAR | Status: DC | PRN

## 2014-09-17 MED ORDER — FENTANYL CITRATE 0.05 MG/ML IJ SOLN
25.0000 ug | INTRAMUSCULAR | Status: AC
  Administered 2014-09-17 (×2): 25 ug via INTRAVENOUS
  Filled 2014-09-17: qty 2

## 2014-09-17 MED ORDER — FENTANYL CITRATE 0.05 MG/ML IJ SOLN
INTRAMUSCULAR | Status: AC
  Filled 2014-09-17: qty 2

## 2014-09-17 MED ORDER — BUPIVACAINE HCL (PF) 0.5 % IJ SOLN
INTRAMUSCULAR | Status: AC
  Filled 2014-09-17: qty 30

## 2014-09-17 MED ORDER — PROPOFOL INFUSION 10 MG/ML OPTIME
INTRAVENOUS | Status: DC | PRN
  Administered 2014-09-17: 50 ug/kg/min via INTRAVENOUS

## 2014-09-17 MED ORDER — BUPIVACAINE HCL (PF) 0.5 % IJ SOLN
INTRAMUSCULAR | Status: DC | PRN
  Administered 2014-09-17: 20 mL

## 2014-09-17 MED ORDER — 0.9 % SODIUM CHLORIDE (POUR BTL) OPTIME
TOPICAL | Status: DC | PRN
  Administered 2014-09-17: 1000 mL

## 2014-09-17 MED ORDER — FENTANYL CITRATE 0.05 MG/ML IJ SOLN
INTRAMUSCULAR | Status: DC | PRN
  Administered 2014-09-17: 25 ug via INTRAVENOUS

## 2014-09-17 MED ORDER — MIDAZOLAM HCL 2 MG/2ML IJ SOLN
INTRAMUSCULAR | Status: AC
  Filled 2014-09-17: qty 2

## 2014-09-17 SURGICAL SUPPLY — 60 items
APL SKNCLS STERI-STRIP NONHPOA (GAUZE/BANDAGES/DRESSINGS) ×2
BAG HAMPER (MISCELLANEOUS) ×3 IMPLANT
BANDAGE CONFORM 2X5YD N/S (GAUZE/BANDAGES/DRESSINGS) ×4 IMPLANT
BANDAGE ELASTIC 4 VELCRO NS (GAUZE/BANDAGES/DRESSINGS) ×3 IMPLANT
BANDAGE ELASTIC 4 VELCRO ST LF (GAUZE/BANDAGES/DRESSINGS) ×4 IMPLANT
BANDAGE ESMARK 4X12 BL STRL LF (DISPOSABLE) ×2 IMPLANT
BANDAGE GAUZE ELAST BULKY 4 IN (GAUZE/BANDAGES/DRESSINGS) ×2 IMPLANT
BENZOIN TINCTURE PRP APPL 2/3 (GAUZE/BANDAGES/DRESSINGS) ×3 IMPLANT
BLADE 15 SAFETY STRL DISP (BLADE) ×2 IMPLANT
BLADE AVERAGE 25X9 (BLADE) IMPLANT
BNDG CMPR 12X4 ELC STRL LF (DISPOSABLE) ×2
BNDG CONFORM 2 STRL LF (GAUZE/BANDAGES/DRESSINGS) ×3 IMPLANT
BNDG ESMARK 4X12 BLUE STRL LF (DISPOSABLE) ×3
BNDG GAUZE ELAST 4 BULKY (GAUZE/BANDAGES/DRESSINGS) ×3 IMPLANT
CHLORAPREP W/TINT 26ML (MISCELLANEOUS) ×1 IMPLANT
CLOTH BEACON ORANGE TIMEOUT ST (SAFETY) ×3 IMPLANT
COVER LIGHT HANDLE STERIS (MISCELLANEOUS) ×6 IMPLANT
CUFF TOURN SGL LL 12 (TOURNIQUET CUFF) IMPLANT
CUFF TOURNIQUET SINGLE 18IN (TOURNIQUET CUFF) ×3 IMPLANT
CUFF TOURNIQUET SINGLE 24IN (TOURNIQUET CUFF) IMPLANT
DECANTER SPIKE VIAL GLASS SM (MISCELLANEOUS) ×3 IMPLANT
DRAPE OEC MINIVIEW 54X84 (DRAPES) ×1 IMPLANT
DRSG ADAPTIC 3X8 NADH LF (GAUZE/BANDAGES/DRESSINGS) ×3 IMPLANT
DURA STEPPER LG (CAST SUPPLIES) IMPLANT
DURA STEPPER MED (CAST SUPPLIES) IMPLANT
DURA STEPPER SML (CAST SUPPLIES) IMPLANT
DURA STEPPER XL (SOFTGOODS) IMPLANT
ELECT REM PT RETURN 9FT ADLT (ELECTROSURGICAL) ×3
ELECTRODE REM PT RTRN 9FT ADLT (ELECTROSURGICAL) ×2 IMPLANT
GAUZE IODOFORM PACK 1/2 7832 (GAUZE/BANDAGES/DRESSINGS) ×2 IMPLANT
GAUZE SPONGE 4X4 12PLY STRL (GAUZE/BANDAGES/DRESSINGS) ×3 IMPLANT
GLOVE BIO SURGEON STRL SZ7.5 (GLOVE) ×3 IMPLANT
GLOVE EXAM NITRILE PF MED BLUE (GLOVE) ×2 IMPLANT
GLOVE INDICATOR 7.0 STRL GRN (GLOVE) ×2 IMPLANT
GLOVE SS BIOGEL STRL SZ 6.5 (GLOVE) ×1 IMPLANT
GLOVE SUPERSENSE BIOGEL SZ 6.5 (GLOVE) ×1
GOWN STRL REUS W/TWL LRG LVL3 (GOWN DISPOSABLE) ×6 IMPLANT
KIT ROOM TURNOVER APOR (KITS) ×3 IMPLANT
MANIFOLD NEPTUNE II (INSTRUMENTS) ×3 IMPLANT
MARKER SKIN DUAL TIP RULER LAB (MISCELLANEOUS) ×3 IMPLANT
NDL BIOPSY JAMSHIDI 11X4 (NEEDLE) IMPLANT
NDL HYPO 27GX1-1/4 (NEEDLE) ×2 IMPLANT
NEEDLE BIOPSY JAMSHIDI 11X4 (NEEDLE) ×3 IMPLANT
NEEDLE HYPO 27GX1-1/4 (NEEDLE) ×6 IMPLANT
NS IRRIG 1000ML POUR BTL (IV SOLUTION) ×3 IMPLANT
PACK BASIC LIMB (CUSTOM PROCEDURE TRAY) ×3 IMPLANT
PAD ARMBOARD 7.5X6 YLW CONV (MISCELLANEOUS) ×3 IMPLANT
RASP SM TEAR CROSS CUT (RASP) IMPLANT
SET BASIN LINEN APH (SET/KITS/TRAYS/PACK) ×3 IMPLANT
SOL PREP PROV IODINE SCRUB 4OZ (MISCELLANEOUS) ×3 IMPLANT
SPONGE GAUZE 4X4 12PLY (GAUZE/BANDAGES/DRESSINGS) ×4 IMPLANT
SPONGE LAP 18X18 X RAY DECT (DISPOSABLE) ×3 IMPLANT
SUT ETHILON 4 0 PS 2 18 (SUTURE) IMPLANT
SUT MON AB 2-0 CT1 36 (SUTURE) ×1 IMPLANT
SUT PROLENE 4 0 PS 2 18 (SUTURE) ×1 IMPLANT
SUT VIC AB 4-0 PS2 27 (SUTURE) ×3 IMPLANT
SUT VICRYL AB 3-0 FS1 BRD 27IN (SUTURE) ×3 IMPLANT
SWAB CULTURE LIQ STUART DBL (MISCELLANEOUS) ×2 IMPLANT
SYRINGE CONTROL L 12CC (SYRINGE) ×6 IMPLANT
SYRINGE CONTROL LL 12CC (SYRINGE) ×2 IMPLANT

## 2014-09-17 NOTE — Anesthesia Postprocedure Evaluation (Signed)
  Anesthesia Post-op Note  Patient: Cassandra [name]  Procedure(s) Performed: Procedure(s): INCISION AND DRAINAGE 1ST TOE LEFT FOOT (Left) BONE BIOPSY (Left)  Patient Location: PACU  Anesthesia Type:MAC  Level of Consciousness: awake, alert , oriented and patient cooperative  Airway and Oxygen Therapy: Patient Spontanous Breathing  Post-op Pain: 2 /10, mild  Post-op Assessment: Post-op Vital signs reviewed, Patient's Cardiovascular Status Stable, Respiratory Function Stable, Patent Airway and Pain level controlled  Post-op Vital Signs: Reviewed and stable  Last Vitals:  Filed Vitals:   09/17/14 1015  BP: 115/58  Pulse: 56  Temp:   Resp: 11    Complications: No apparent anesthesia complications

## 2014-09-17 NOTE — Anesthesia Preprocedure Evaluation (Signed)
Anesthesia Evaluation  Patient identified by MRN, date of birth, ID band Patient awake    Reviewed: Allergy & Precautions, H&P , NPO status , Patient's Chart, lab work & pertinent test results, reviewed documented beta blocker date and time   Airway Mallampati: I TM Distance: >3 FB     Dental  (+) Edentulous Upper, Edentulous Lower   Pulmonary  breath sounds clear to auscultation        Cardiovascular hypertension, Pt. on medications and Pt. on home beta blockers + Valvular Problems/Murmurs (hx patent foramen ovale) MVP Rhythm:Regular Rate:Normal     Neuro/Psych PSYCHIATRIC DISORDERS Depression    GI/Hepatic hiatal hernia,   Endo/Other    Renal/GU      Musculoskeletal  (+) Arthritis -,   Abdominal   Peds  Hematology   Anesthesia Other Findings   Reproductive/Obstetrics                           Anesthesia Physical Anesthesia Plan  ASA: III  Anesthesia Plan: MAC   Post-op Pain Management:    Induction: Intravenous  Airway Management Planned: Simple Face Mask  Additional Equipment:   Intra-op Plan:   Post-operative Plan:   Informed Consent: I have reviewed the patients History and Physical, chart, labs and discussed the procedure including the risks, benefits and alternatives for the proposed anesthesia with the patient or authorized representative who has indicated his/her understanding and acceptance.     Plan Discussed with:   Anesthesia Plan Comments:         Anesthesia Quick Evaluation  

## 2014-09-17 NOTE — Progress Notes (Signed)
BRIEF OPERATIVE NOTE  SURGEON:   Dallas SchimkeBenjamin Ivan Dacari Beckstrand, DPM  OR STAFF:   Circulator: Eliane Decreeatherine Jann Page, RN Scrub Person: Hurshel PartyAngela Maria Witt, CST   PREOPERATIVE DIAGNOSIS:   1.  Cellulitis of the left hallux 2.  Abscess of the left hallux. 3.  Septic interphalangeal joint of the left hallux 4.  Concern for osteomyelitis of the distal phalanx of the left hallux    POSTOPERATIVE DIAGNOSIS: Same  PROCEDURE: 1.  Incision and drainage of the left hallux and foot 2.  Bone biopsy of the distal phalanx of the left hallux  ANESTHESIA:  Monitor Anesthesia Care   HEMOSTASIS:   Pneumatic ankle tourniquet set at 250 mmHg  ESTIMATED BLOOD LOSS:   Minimal (<5 cc)  MATERIALS USED:  Jamshidi needle  INJECTABLES: Marcaine 0.5% plain; 20mL  PATHOLOGY:   1.  Aerobic culture 2.  Anaerobic culture 3.  Bone biopsy of the distal phalanx for pathology  COMPLICATIONS:   None

## 2014-09-17 NOTE — Transfer of Care (Signed)
Immediate Anesthesia Transfer of Care Note  Patient: Cassandra [name]  Procedure(s) Performed: Procedure(s): INCISION AND DRAINAGE 1ST TOE LEFT FOOT (Left) AMPUTATION 1ST DIGIT LEFT FOOT (Left)  Patient Location: PACU  Anesthesia Type:MAC  Level of Consciousness: awake and patient cooperative  Airway & Oxygen Therapy: Patient Spontanous Breathing and Patient connected to face mask oxygen  Post-op Assessment: Report given to PACU RN, Post -op Vital signs reviewed and stable and Patient moving all extremities  Post vital signs: Reviewed and stable  Complications: No apparent anesthesia complications

## 2014-09-17 NOTE — Progress Notes (Signed)
[facility] PROGRESS NOTE  Judieth KeensBetty R Hynson WUJ:[id number] DOB: 04/22/1933 DOA: 09/15/2014 PCP: Colette RibasGOLDING, JOHN CABOT, MD    Code Status: Full code Family Communication: Discussed with patient and daughter Disposition Plan: Discharge when clinically appropriate.   Consultants:  Podiatrist, Dr. Nolen MuMcKinney  Procedures:  09/17/14-planned incision and drainage of left hallux abscess  Antibiotics:  Zosyn 10/15>>  Vancomycin 10/15>>  HPI/Subjective: The patient is sitting up in bed, getting ready for surgery. She has no complaints.  Objective: Filed Vitals:   09/17/14 0804  BP: 114/69  Pulse: 64  Temp: 98.7 F (37.1 C)  Resp: 12   temperature max 101.1. oxygen saturation 99% on room air.  Intake/Output Summary (Last 24 hours) at 09/17/14 0837 Last data filed at 09/16/14 1900  Gross per 24 hour  Intake    830 ml  Output    300 ml  Net    530 ml   Filed Weights   09/15/14 2005 09/16/14 0306  Weight: 79.379 kg (175 lb) 81.33 kg (179 lb 4.8 oz)    Exam:   General:  Pleasant elderly woman sitting up in bed, in no acute distress.  Cardiovascular: S1, S2, with no murmurs rubs or gallops.  Respiratory:  Clear to auscultation bilaterally.  Abdomen: Positive bowel sounds, soft, nontender, nondistended.  Musculoskeletal/extremities: Bandage is on both feet-clean and dry and not taken off, but per exam yesterday the left foot was edematous and boggy with a swollen right great toe with ecchymosis and bluish blackish tint and with a small blood blister; palpation of fluctuance.  Neurologic: The patient is alert and oriented x3. Cranial nerves II through XII are intact.  Data Reviewed: Basic Metabolic Panel:  Recent Labs Lab 09/15/14 2345 09/17/14 0517  NA 136* 142  K 4.0 3.7  CL 100 107  CO2 25 25  GLUCOSE 115* 105*  BUN 20 16  CREATININE 0.91 0.93  CALCIUM 9.5 8.7   Liver Function Tests:  Recent Labs Lab 09/15/14 2345 09/17/14 0517  AST 14 13   ALT 13 11  ALKPHOS 107 104  BILITOT 1.2 0.7  PROT 7.2 6.3  ALBUMIN 3.4* 2.6*   No results found for this basename: LIPASE, AMYLASE,  in the last 168 hours No results found for this basename: AMMONIA,  in the last 168 hours CBC:  Recent Labs Lab 09/15/14 2345 09/17/14 0517  WBC 20.5* 12.4*  NEUTROABS 18.0*  --   HGB 11.3* 9.9*  HCT 33.3* 29.8*  MCV 90.0 91.1  PLT 237 217   Cardiac Enzymes: No results found for this basename: CKTOTAL, CKMB, CKMBINDEX, TROPONINI,  in the last 168 hours BNP (last 3 results) No results found for this basename: PROBNP,  in the last 8760 hours CBG:  Recent Labs Lab 09/16/14 0110  GLUCAP 123*    Recent Results (from the past 240 hour(s))  WOUND CULTURE     Status: None   Collection Time    09/16/14 12:17 AM      Result Value Ref Range Status   Specimen Description WOUND LEFT FOOT   Final   Special Requests NONE   Final   Gram Stain PENDING   Incomplete   Culture     Final   Value: MODERATE STAPHYLOCOCCUS AUREUS     Note: RIFAMPIN AND GENTAMICIN SHOULD NOT BE USED AS SINGLE DRUGS FOR TREATMENT OF STAPH INFECTIONS.     Performed at [facility]   Report Status PENDING   Incomplete     Studies: Mr  Foot Left W Wo Contrast  09/16/2014   CLINICAL DATA:  Great toe pain and swelling for 1 week. Toenail fell off 1 year ago. Chronic drainage. SAPHO syndrome.  EXAM: MRI OF THE LEFT FOREFOOT WITHOUT AND WITH CONTRAST  TECHNIQUE: Multiplanar, multisequence MR imaging was performed both before and after administration of intravenous contrast.  CONTRAST:  15mL MULTIHANCE GADOBENATE DIMEGLUMINE 529 MG/ML IV SOLN  COMPARISON:  09/16/2014 radiographs  FINDINGS: Articular erosion of the base of the distal phalanx great toe all with abnormal soft tissue enhancement, abnormal fluid in the interphalangeal joint, and fluid extension from the joint especially medially and in the plantar region forming a cavity suspicious for abscess but possibly draining  to the medial scan. Abnormal erosion and irregularity of the distal articular surface of the proximal phalanx with edema signal and enhancement in the proximal phalanx and surrounding soft tissues.  Low grade edema tracks along the plantar musculature of the foot. Lisfranc ligament intact and Lisfranc alignment normal. The remaining toes and bony structures appear intact.  IMPRESSION: 1. Suspected septic arthritis of the interphalangeal joint with extensive extracapsular surrounding fluid collection compatible with abscess tracking in the toe and possibly draining to the medial skin. Surrounding soft tissue enhancement and bony destructive findings along the interphalangeal joint. The bony findings are clearly worse compared to the prior CT scan of 07/18/2014. This is not a typical joint to be involved in SAPHO syndrome, and the patient's age makes chronic recurrent multifocal osteomyelitis (CRMO) unlikely. Gout arthropathy is considered unlikely given the degree of fluid collections. The joint should probably be drained.   Electronically Signed   By: Herbie Baltimore M.D.   On: 09/16/2014 09:02   Dg Toe Great Left  09/16/2014   CLINICAL DATA:  Left great toe swelling and drainage. Initial encounter.  EXAM: LEFT GREAT TOE  COMPARISON:  Left forefoot MRI - 09/15/2014  FINDINGS: There is diffuse soft tissue swelling about the great toe. This finding is associated with osteolysis involving the base of the distal phalanx of the great toe and deformity of the IP joint. No definite fracture or dislocation. No radiopaque foreign body.  Remaining joint spaces appear preserved.  IMPRESSION: Findings worrisome for septic arthritis involving the IP joint of the great toe with osteomyelitis involving at least the base of the distal phalanx of the great toe. Correlation with MRI performed 09/15/2014 is recommended.   Electronically Signed   By: Simonne Come M.D.   On: 09/16/2014 01:13    Scheduled Meds: . [facility] HOLD]  atenolol  100 mg Oral Daily  . [facility] HOLD] docusate sodium  100 mg Oral BID  . [facility] HOLD] famotidine  20 mg Oral BID  . [[facility] HOLD] latanoprost  1 drop Both Eyes QHS  . [[facility] HOLD] piperacillin-tazobactam (ZOSYN)  IV  3.375 g Intravenous Q8H  . [facility] HOLD] sertraline  100 mg Oral Daily  . [facility] HOLD] simvastatin  10 mg Oral QHS  . [facility] HOLD] vancomycin  1,250 mg Intravenous Q24H   Continuous Infusions:   Assessment and plan:  Principal Problem:   Cellulitis and abscess of toe of left foot Active Problems:   Septic arthritis of interphalangeal joint of toe of left foot   Osteomyelitis of toe of left foot   Hypertension   Normocytic anemia   1. Cellulitis/abscess/possible osteomyelitis of the left great/septic arthritis of the interphalangeal joint left hallux. She was febrile overnight, but afebrile now. Her white blood cell count is trending downward. The  patient was evaluated by podiatrist Dr. Nolen MuMcKinney. Incision and drainage scheduled for this morning. Per Dr. Nolen MuMcKinney, she is at risk with partial or total amputation of the left great toe. Drainage culture prior to the operation is growing out moderate Staphylococcus aureus-sensitivities pending. We'll continue vancomycin and Zosyn. Further wound care per Dr. Nolen MuMcKinney.  Normocytic anemia. This is likely the consequence of acute illness. We'll order an anemia panel. We'll continue to monitor for need of transfusion.  Hypertension. We'll continue atenolol, but will decrease the dose to 50 mg as her blood pressure is low-normal.  Time spent: 35 minutes    [facility] Center[facility]ISHER,Sevyn Markham  [facility] Pager [phone]. If 7PM-7AM, please contact night-coverage at [email or web address], password [email or web address] 09/17/2014, 8:37 AM  LOS: 2 days

## 2014-09-17 NOTE — Op Note (Signed)
OPERATIVE NOTE  DATE OF PROCEDURE:  09/17/2014  SURGEON:   Dallas SchimkeBenjamin Ivan Daveigh Batty, DPM  OR STAFF:   Circulator: Eliane Decreeatherine Jann Page, RN Scrub Person: Hurshel PartyAngela Maria Witt, CST   PREOPERATIVE DIAGNOSIS:   1.  Cellulitis of the left hallux 2.  Abscess of the left hallux. 3.  Septic interphalangeal joint of the left hallux 4.  Concern for osteomyelitis of the distal phalanx of the left hallux    POSTOPERATIVE DIAGNOSIS: Same  PROCEDURE: 1.  Incision and drainage of the left hallux and foot 2.  Bone biopsy of the distal phalanx of the left hallux  ANESTHESIA:  Monitor Anesthesia Care   HEMOSTASIS:   Pneumatic ankle tourniquet set at 250 mmHg  ESTIMATED BLOOD LOSS:   Minimal (<5 cc)  MATERIALS USED:  Jamshidi needle  INJECTABLES: Marcaine 0.5% plain; 20mL  PATHOLOGY:   1.  Aerobic culture 2.  Anaerobic culture 3.  Bone biopsy of the distal phalanx for pathology  COMPLICATIONS:   None  DESCRIPTION OF THE PROCEDURE:   The patient was brought to the operating room and placed on the operative table in the supine position.  A pneumatic ankle tourniquet was applied to the patient's ankle.  Following sedation, the surgical site was anesthetized with 0.5% Marcaine plain.  The foot was then prepped, scrubbed, and draped in the usual sterile technique.  The pneumatic ankle tourniquet inflated to 250 mmHg.    Attention was directed to the dorsal these aspect of the left great toe.  There was onycholysis of the distal 90% of the left great toenail.  The left great toenail was removed using a hemostat.  Attention was directed to the dorsal medial aspect of the left great toe.  An area of fluctuance was appreciated.  A linear longitudinal incision was made medial to the nail.  Fluctuance was expressed from the wound.  The wound tracked proximally and laterally.  The wound also tracked plantarly into the first interspace.  A second linear incision was made along the plantar aspect of the  left foot to allow for exposure plantarly.  The surgical wound was continuous with the first interspace laterally and extended to the medial aspect of the first metatarsalgia joint and dorsally to the medial aspect of the left great toe to the initial skin incision.  Aerobic and anaerobic cultures were obtained.  The wound was irrigated with copious amounts of sterile irrigant.  The proximal and distal phalanges were identified.  Both bones were firm.  A Jamshidi needle was used to perform a bone biopsy to the proximal medial aspect of the distal phalanx.  The specimen was sent to pathology for evaluation.  The surgical wounds were again irrigated with copious amounts of sterile irrigant.  Both wounds were packed with quarter-inch iodoform gauze.  A sterile compressive dressing was applied to the operative foot.  The pneumatic ankle tourniquet was deflated and a prompt hyperemic response was noted to all digits of the left foot.  The patient tolerated the procedure well.  The patient was then transferred to PACU with vital signs stable and vascular status intact to all toes of the operative foot.  Following a period of postoperative monitoring, the patient will be transferred back to the floor.

## 2014-09-18 LAB — WOUND CULTURE: GRAM STAIN: NONE SEEN

## 2014-09-18 LAB — CBC
HEMATOCRIT: 28.4 % — AB (ref 36.0–46.0)
HEMOGLOBIN: 9.4 g/dL — AB (ref 12.0–15.0)
MCH: 30 pg (ref 26.0–34.0)
MCHC: 33.1 g/dL (ref 30.0–36.0)
MCV: 90.7 fL (ref 78.0–100.0)
Platelets: 266 10*3/uL (ref 150–400)
RBC: 3.13 MIL/uL — AB (ref 3.87–5.11)
RDW: 14 % (ref 11.5–15.5)
WBC: 14 10*3/uL — ABNORMAL HIGH (ref 4.0–10.5)

## 2014-09-18 LAB — BASIC METABOLIC PANEL
Anion gap: 10 (ref 5–15)
BUN: 14 mg/dL (ref 6–23)
CALCIUM: 8.5 mg/dL (ref 8.4–10.5)
CO2: 25 meq/L (ref 19–32)
Chloride: 108 mEq/L (ref 96–112)
Creatinine, Ser: 0.87 mg/dL (ref 0.50–1.10)
GFR calc Af Amer: 72 mL/min — ABNORMAL LOW (ref >90)
GFR, EST NON AFRICAN AMERICAN: 62 mL/min — AB (ref >90)
Glucose, Bld: 93 mg/dL (ref 70–99)
Potassium: 3.4 mEq/L — ABNORMAL LOW (ref 3.7–5.3)
Sodium: 143 mEq/L (ref 137–147)

## 2014-09-18 MED ORDER — POTASSIUM CHLORIDE CRYS ER 20 MEQ PO TBCR
20.0000 meq | EXTENDED_RELEASE_TABLET | Freq: Every day | ORAL | Status: AC
  Administered 2014-09-18: 20 meq via ORAL
  Filled 2014-09-18: qty 1

## 2014-09-18 NOTE — Addendum Note (Signed)
Addendum created 09/18/14 1151 by Earleen NewportAmy A Adams, CRNA   Modules edited: Notes Section   Notes Section:  File: [id number]

## 2014-09-18 NOTE — Progress Notes (Signed)
Subjective: Cassandra [name] was seen today for dressing change of both feet.  She is postoperative day 1 following incision and drainage of the left foot with bone biopsy of the distal phalanx of the left great toe.  Overall, her pain is well controlled.  She states that she feels better than she did before surgery.  She states that "someone told me that Dr. Hilda Lias may be consulted".  Her daughter and granddaughter present for today's visit.  Dr. Sherrie Mustache was present for the dressing change.   Past Medical History  Diagnosis Date  . Hypertension   . Arthritis   . Depression   . Hyperlipidemia   . PFO (patent foramen ovale)     small  . H/O echocardiogram 2008    prolapse of post. mitral leaflet, tr MR, tr TR, EF >55%, mild concentric LVH  . H/O cardiovascular stress test 10/13/2007    low risk scan, no changes from previous test  . Heart palpitations     controlled on BB  . Hiatal hernia   . Cellulitis and abscess of toe of left foot 09/16/2014   Scheduled Meds: . atenolol  50 mg Oral Daily  . docusate sodium  100 mg Oral BID  . famotidine  20 mg Oral BID  . latanoprost  1 drop Both Eyes QHS  . piperacillin-tazobactam (ZOSYN)  IV  3.375 g Intravenous Q8H  . potassium chloride  20 mEq Oral Daily  . sertraline  100 mg Oral Daily  . simvastatin  10 mg Oral QHS  . vancomycin  1,250 mg Intravenous Q24H   Continuous Infusions:  PRN Meds:.acetaminophen, acetaminophen, albuterol, morphine injection, ondansetron (ZOFRAN) IV, ondansetron, polyvinyl alcohol, traMADol  Allergies  Allergen Reactions  . Codeine Shortness Of Breath and Nausea And Vomiting  . Hydrocodone Shortness Of Breath and Nausea And Vomiting   Past Surgical History  Procedure Laterality Date  . Cholecystectomy    . Cardiac catheterization  10/2002    normal coronary arteries, normal LV function  . Incision and drainage Right 07/21/2014    Procedure: INCISION AND DRAINAGE;  Surgeon: Dallas Schimke, DPM;   Location: [facility];  Service: Podiatry;  Laterality: Right;  . Incision and drainage Left 09/17/2014    Procedure: INCISION AND DRAINAGE 1ST TOE LEFT FOOT;  Surgeon: Dallas Schimke, DPM;  Location: [facility];  Service: Podiatry;  Laterality: Left;  . Bone biopsy Left 09/17/2014    Procedure: BONE BIOPSY;  Surgeon: Dallas Schimke, DPM;  Location: [facility];  Service: Podiatry;  Laterality: Left;   Family History  Problem Relation Age of Onset  . Heart disease Mother   . Cancer Father   . Kidney disease Brother   . Hypertension Daughter   . Hyperlipidemia Son    Social History:  reports that she has never smoked. She has never used smokeless tobacco. She reports that she does not drink alcohol or use illicit drugs.  Physical Examination: Vital signs in last 24 hours:   Temp:  [98.1 F (36.7 C)-99.8 F (37.7 C)] 98.1 F (36.7 C) (10/17 0611) Pulse Rate:  [71-79] 71 (10/17 1048) Resp:  [13-20] 13 (10/17 0611) BP: (116-143)/(46-66) 129/65 mmHg (10/17 1048) SpO2:  [97 %-99 %] 99 % (10/17 0611)  Right foot:  A dressing is clean, dry and intact.  There is a full-thickness wound present along the medial aspect of the right first metatarsophalangeal joint.  The wound bed is granular.  There are no acute signs of infection present.  There is a full-thickness ulceration present along the distal aspect of the great toe.  The ulceration is granular.  There are no acute signs of infection present.  Overall, both wounds are stable and appear to be progressing well.  Dorsalis pedis pulse is palpable.  Posterior tibial pulse is palpable. The calf is soft and nontender.  Left foot:  A surgical dressing is clean, dry and intact.  No strikethrogh.  There is persistent erythema of the left great toe.  The left great toenail is absent.  Packing is present in the surgical wounds dorsally and plantarly.  The packing was removed.  No purulence was expressed with proximal or distal palpation of the left  foot.  The wound beds are fibrotic.  No malodor was appreciated today.  Dorsalis pedis pulse is palpable.  Posterior tibial pulses palpable.  Lab/Test Results:    Recent Labs  09/17/14 0517 09/18/14 0552  WBC 12.4* 14.0*  HGB 9.9* 9.4*  HCT 29.8* 28.4*  PLT 217 266  NA 142 143  K 3.7 3.4*  CL 107 108  CO2 25 25  BUN 16 14  CREATININE 0.93 0.87  GLUCOSE 105* 93  CALCIUM 8.7 8.5    Recent Results (from the past 240 hour(s))  WOUND CULTURE     Status: None   Collection Time    09/16/14 12:17 AM      Result Value Ref Range Status   Specimen Description WOUND LEFT FOOT   Final   Special Requests NONE   Final   Gram Stain     Final   Value: NO WBC SEEN     NO SQUAMOUS EPITHELIAL CELLS SEEN     RARE GRAM POSITIVE COCCI     IN PAIRS     Performed at [facility]   Culture     Final   Value: MODERATE STAPHYLOCOCCUS AUREUS     Note: RIFAMPIN AND GENTAMICIN SHOULD NOT BE USED AS SINGLE DRUGS FOR TREATMENT OF STAPH INFECTIONS.     Performed at [facility]   Report Status 09/18/2014 FINAL   Final   Organism ID, Bacteria STAPHYLOCOCCUS AUREUS   Final  SURGICAL PCR SCREEN     Status: Abnormal   Collection Time    09/17/14  7:41 AM      Result Value Ref Range Status   MRSA, PCR NEGATIVE  NEGATIVE Final   Staphylococcus aureus POSITIVE (*) NEGATIVE Final   Comment:            The Xpert SA Assay (FDA     approved for NASAL specimens     in patients over 78 years of age),     is one component of     a comprehensive surveillance     program.  Test performance has     been validated by [facility] for patients greater     than or equal to 78 year old.     It is not intended     to diagnose infection nor to     guide or monitor treatment.     RESULT CALLED TO, READ BACK BY AND VERIFIED WITH:     JENIFFER AT 1106 ON 09/17/14 BY S.VANHOORNE  WOUND CULTURE     Status: None   Collection Time    09/17/14 10:27 AM      Result Value Ref Range Status    Specimen Description TOE LEFT BIG   Final   Special Requests  NONE   Final   Gram Stain     Final   Value: NO WBC SEEN     NO SQUAMOUS EPITHELIAL CELLS SEEN     MODERATE GRAM POSITIVE COCCI     IN PAIRS IN CLUSTERS     Performed at [facility]   Culture     Final   Value: Culture reincubated for better growth     Performed at [facility]   Report Status PENDING   Incomplete  ANAEROBIC CULTURE     Status: None   Collection Time    09/17/14 10:27 AM      Result Value Ref Range Status   Specimen Description TOE LEFT BIG   Final   Special Requests NONE   Final   Gram Stain     Final   Value: NO WBC SEEN     NO SQUAMOUS EPITHELIAL CELLS SEEN     MODERATE GRAM POSITIVE COCCI     IN PAIRS IN CLUSTERS     Performed at [facility]   Culture     Final   Value: NO ANAEROBES ISOLATED; CULTURE IN PROGRESS FOR 5 DAYS     Performed at [facility]   Report Status PENDING   Incomplete     Dg Foot Complete Left  09/17/2014   CLINICAL DATA:  Postop incision and drainage great toe  EXAM: LEFT FOOT - COMPLETE 3+ VIEW  COMPARISON:  09/16/2014  FINDINGS: Soft tissue swelling of the great toe. There is a wound along the medial aspect of the toe. Erosive changes in the interphalangeal joints suggestive of osteomyelitis unchanged. No new findings.  IMPRESSION: Postop changes as above. Findings likely related to septic arthritis of the first interphalangeal joint.   Electronically Signed   By: Marlan Palauharles  Clark M.D.   On: 09/17/2014 11:42    Assessment: 1.  Postoperative day one status post incision and drainage of left great toe and bone biopsy of distal phalanx of left great toe 2.  Cellulitis of the left hallux. 3.  Septic joint of the interphalangeal joint of the left hallux. 4.  Concern for underlying osteomyelitis of the distal phalanx of the left hallux. 5.  Stable wound an ulceration of the right hallux.  Plan: The surgical wounds of the left foot were packed  with iodoform gauze and covered with a dry dressing.  A dressing was applied to the right foot.  I explained to the patient that the appearance of the left great toe is very concerning and she may require an amputation of the left great toe.  I would like to see how she progresses over the next 24 hours.  If an amputation is performed, she will require 10-14 days of oral antibiotics based on culture results.  If an amputation is not performed, I anticipate that she will need approximately 6 weeks of IV antibiotic therapy.  I explained to her that I am not aware of a consult for orthopedic surgery.  I also stressed the importance of monitoring her feet daily.  I advised her to notify my office immediately of any changes in the future.  I explained to her the importance of prompt treatment.  I will change her dressings tomorrow.  I appreciate Dr. Bjorn PippinFischer's assistance with the care of Ms. Dipalma.  Sameul Tagle IVAN 09/18/2014, 1:18 PM

## 2014-09-18 NOTE — Anesthesia Postprocedure Evaluation (Signed)
  Anesthesia Post-op Note  Patient: Cassandra [name]  Procedure(s) Performed: Procedure(s): INCISION AND DRAINAGE 1ST TOE LEFT FOOT (Left) BONE BIOPSY (Left)  Patient Location:Room 302  Anesthesia Type:MAC  Level of Consciousness: awake, alert , oriented and patient cooperative  Airway and Oxygen Therapy: Patient Spontanous Breathing  Post-op Pain: mild  Post-op Assessment: Post-op Vital signs reviewed, Patient's Cardiovascular Status Stable, Respiratory Function Stable, Patent Airway, No signs of Nausea or vomiting and Pain level controlled  Post-op Vital Signs: Reviewed and stable  Last Vitals:  Filed Vitals:   09/18/14 1048  BP: 129/65  Pulse: 71  Temp:   Resp:     Complications: No apparent anesthesia complications

## 2014-09-18 NOTE — Progress Notes (Signed)
[facility] PROGRESS NOTE  Cassandra [name] JXB:[id number] DOB: 02/28/1933 DOA: 09/15/2014 PCP: Colette RibasGOLDING, JOHN CABOT, MD    Code Status: Full code Family Communication: Discussed with patient and daughter Disposition Plan: Discharge when clinically appropriate   Consultants:  Podiatrist, Dr. Nolen MuMcKinney  Procedures:  09/17/14-planned incision and drainage of left hallux abscess and foot; bone biopsy of the distal phalanx of the left hallux.  Antibiotics:  Zosyn 10/15>>  Vancomycin 10/15>>  HPI/Subjective: The patient has no new complaints. She does have some left foot soreness.  Objective: Filed Vitals:   09/18/14 1048  BP: 129/65  Pulse: 71  Temp:   Resp:    temperature 98.1. Respiratory rate 13. Oxygen saturation 99% on room air.  Intake/Output Summary (Last 24 hours) at 09/18/14 1222 Last data filed at 09/18/14 0900  Gross per 24 hour  Intake    560 ml  Output    400 ml  Net    160 ml   Filed Weights   09/15/14 2005 09/16/14 0306  Weight: 79.379 kg (175 lb) 81.33 kg (179 lb 4.8 oz)    Exam:   General:  Pleasant elderly woman sitting up in bed, in no acute distress.  Cardiovascular: S1, S2, with no murmurs rubs or gallops.  Respiratory:  Clear to auscultation bilaterally.  Abdomen: Positive bowel sounds, soft, nontender, nondistended.  Musculoskeletal/extremities: Postoperative bandages are on both feet-not taken off (will examine with Dr. Nolen MuMcKinney later). Patient able to wiggle her toes with no difficulty. No ankle edema.  Neurologic: The patient is alert and oriented x3. Cranial nerves II through XII are intact.  Data Reviewed: Basic Metabolic Panel:  Recent Labs Lab 09/15/14 2345 09/17/14 0517 09/18/14 0552  NA 136* 142 143  K 4.0 3.7 3.4*  CL 100 107 108  CO2 25 25 25   GLUCOSE 115* 105* 93  BUN 20 16 14   CREATININE 0.91 0.93 0.87  CALCIUM 9.5 8.7 8.5   Liver Function Tests:  Recent Labs Lab 09/15/14 2345 09/17/14 0517   AST 14 13  ALT 13 11  ALKPHOS 107 104  BILITOT 1.2 0.7  PROT 7.2 6.3  ALBUMIN 3.4* 2.6*   No results found for this basename: LIPASE, AMYLASE,  in the last 168 hours No results found for this basename: AMMONIA,  in the last 168 hours CBC:  Recent Labs Lab 09/15/14 2345 09/17/14 0517 09/18/14 0552  WBC 20.5* 12.4* 14.0*  NEUTROABS 18.0*  --   --   HGB 11.3* 9.9* 9.4*  HCT 33.3* 29.8* 28.4*  MCV 90.0 91.1 90.7  PLT 237 217 266   Cardiac Enzymes: No results found for this basename: CKTOTAL, CKMB, CKMBINDEX, TROPONINI,  in the last 168 hours BNP (last 3 results) No results found for this basename: PROBNP,  in the last 8760 hours CBG:  Recent Labs Lab 09/16/14 0110  GLUCAP 123*    Recent Results (from the past 240 hour(s))  WOUND CULTURE     Status: None   Collection Time    09/16/14 12:17 AM      Result Value Ref Range Status   Specimen Description WOUND LEFT FOOT   Final   Special Requests NONE   Final   Gram Stain     Final   Value: NO WBC SEEN     NO SQUAMOUS EPITHELIAL CELLS SEEN     RARE GRAM POSITIVE COCCI     IN PAIRS     Performed at [facility]   Culture  Final   Value: MODERATE STAPHYLOCOCCUS AUREUS     Note: RIFAMPIN AND GENTAMICIN SHOULD NOT BE USED AS SINGLE DRUGS FOR TREATMENT OF STAPH INFECTIONS.     Performed at [facility]   Report Status 09/18/2014 FINAL   Final   Organism ID, Bacteria STAPHYLOCOCCUS AUREUS   Final  SURGICAL PCR SCREEN     Status: Abnormal   Collection Time    09/17/14  7:41 AM      Result Value Ref Range Status   MRSA, PCR NEGATIVE  NEGATIVE Final   Staphylococcus aureus POSITIVE (*) NEGATIVE Final   Comment:            The Xpert SA Assay (FDA     approved for NASAL specimens     in patients over 78 years of age),     is one component of     a comprehensive surveillance     program.  Test performance has     been validated by [facility] for patients greater     than or equal to 1 year  old.     It is not intended     to diagnose infection nor to     guide or monitor treatment.     RESULT CALLED TO, READ BACK BY AND VERIFIED WITH:     JENIFFER AT 1106 ON 09/17/14 BY S.VANHOORNE  WOUND CULTURE     Status: None   Collection Time    09/17/14 10:27 AM      Result Value Ref Range Status   Specimen Description TOE LEFT BIG   Final   Special Requests NONE   Final   Gram Stain     Final   Value: NO WBC SEEN     NO SQUAMOUS EPITHELIAL CELLS SEEN     MODERATE GRAM POSITIVE COCCI     IN PAIRS IN CLUSTERS     Performed at [facility]   Culture     Final   Value: Culture reincubated for better growth     Performed at [facility]   Report Status PENDING   Incomplete  ANAEROBIC CULTURE     Status: None   Collection Time    09/17/14 10:27 AM      Result Value Ref Range Status   Specimen Description TOE LEFT BIG   Final   Special Requests NONE   Final   Gram Stain     Final   Value: NO WBC SEEN     NO SQUAMOUS EPITHELIAL CELLS SEEN     MODERATE GRAM POSITIVE COCCI     IN PAIRS IN CLUSTERS     Performed at [facility]   Culture     Final   Value: NO ANAEROBES ISOLATED; CULTURE IN PROGRESS FOR 5 DAYS     Performed at [facility]   Report Status PENDING   Incomplete     Studies: Dg Foot Complete Left  09/17/2014   CLINICAL DATA:  Postop incision and drainage great toe  EXAM: LEFT FOOT - COMPLETE 3+ VIEW  COMPARISON:  09/16/2014  FINDINGS: Soft tissue swelling of the great toe. There is a wound along the medial aspect of the toe. Erosive changes in the interphalangeal joints suggestive of osteomyelitis unchanged. No new findings.  IMPRESSION: Postop changes as above. Findings likely related to septic arthritis of the first interphalangeal joint.   Electronically Signed   By: Marlan Palauharles  Clark M.D.  On: 09/17/2014 11:42    Scheduled Meds: . atenolol  50 mg Oral Daily  . docusate sodium  100 mg Oral BID  . famotidine  20 mg Oral BID  .  latanoprost  1 drop Both Eyes QHS  . piperacillin-tazobactam (ZOSYN)  IV  3.375 g Intravenous Q8H  . sertraline  100 mg Oral Daily  . simvastatin  10 mg Oral QHS  . vancomycin  1,250 mg Intravenous Q24H   Continuous Infusions:   Assessment and plan:  Principal Problem:   Cellulitis and abscess of toe of left foot Active Problems:   Septic arthritis of interphalangeal joint of toe of left foot   Osteomyelitis of toe of left foot   Hypertension   Normocytic anemia   1. Staphylococcus aureus cellulitis/abscess/possible osteomyelitis of the left great/septic arthritis of the interphalangeal joint left hallux. Postoperative day #1, status post incision and drainage of left hallux and foot and bone biopsy of the distal phalanx of the left hallux. Wound culture prior to the operation is growing Staphylococcus aureus. Perioperative wound culture/bone biopsy is pending. She is now afebrile. Overall, her white blood cell count has trended downward. We'll continue vancomycin and Zosyn. We'll await Dr. Loralie Champagne followup recommendations. Not sure if the patient will need IV antibiotics and/or skilled nursing facility placement for wound care. This will be discussed with Dr. Nolen Mu.  Normocytic anemia. This is likely the consequence of acute illness and possibly hemodilution from IV fluids.. Anemia panel will be ordered.Maryclare Labrador continue to monitor for need of transfusion.  Hypertension. The dose of atenolol was decreased to 50 mg from 100 mg secondary to low-normal blood pressures. Blood pressure is currently stable and within normal limits.  Mild hypokalemia. Will supplement.  Time spent: 35 minutes    [facility]  [facility] Pager [phone]. If 7PM-7AM, please contact night-coverage at [email or web address], password [facility] 09/18/2014, 12:22 PM  LOS: 3 days

## 2014-09-19 LAB — IRON AND TIBC
IRON: 22 ug/dL — AB (ref 42–135)
Saturation Ratios: 14 % — ABNORMAL LOW (ref 20–55)
TIBC: 160 ug/dL — ABNORMAL LOW (ref 250–470)
UIBC: 138 ug/dL (ref 125–400)

## 2014-09-19 LAB — VANCOMYCIN, TROUGH: VANCOMYCIN TR: 9.8 ug/mL — AB (ref 10.0–20.0)

## 2014-09-19 LAB — FOLATE: Folate: 15.5 ng/mL

## 2014-09-19 LAB — BASIC METABOLIC PANEL
ANION GAP: 9 (ref 5–15)
BUN: 15 mg/dL (ref 6–23)
CALCIUM: 9 mg/dL (ref 8.4–10.5)
CO2: 27 meq/L (ref 19–32)
Chloride: 107 mEq/L (ref 96–112)
Creatinine, Ser: 0.87 mg/dL (ref 0.50–1.10)
GFR calc non Af Amer: 62 mL/min — ABNORMAL LOW (ref >90)
GFR, EST AFRICAN AMERICAN: 72 mL/min — AB (ref >90)
Glucose, Bld: 99 mg/dL (ref 70–99)
Potassium: 3.4 mEq/L — ABNORMAL LOW (ref 3.7–5.3)
SODIUM: 143 meq/L (ref 137–147)

## 2014-09-19 LAB — FERRITIN: FERRITIN: 231 ng/mL (ref 10–291)

## 2014-09-19 LAB — CBC
HCT: 27.2 % — ABNORMAL LOW (ref 36.0–46.0)
Hemoglobin: 9 g/dL — ABNORMAL LOW (ref 12.0–15.0)
MCH: 29.9 pg (ref 26.0–34.0)
MCHC: 33.1 g/dL (ref 30.0–36.0)
MCV: 90.4 fL (ref 78.0–100.0)
Platelets: 262 10*3/uL (ref 150–400)
RBC: 3.01 MIL/uL — AB (ref 3.87–5.11)
RDW: 13.6 % (ref 11.5–15.5)
WBC: 9.2 10*3/uL (ref 4.0–10.5)

## 2014-09-19 LAB — TSH: TSH: 3.18 u[IU]/mL (ref 0.350–4.500)

## 2014-09-19 LAB — VITAMIN B12: Vitamin B-12: 1469 pg/mL — ABNORMAL HIGH (ref 211–911)

## 2014-09-19 MED ORDER — VANCOMYCIN HCL IN DEXTROSE 750-5 MG/150ML-% IV SOLN
750.0000 mg | Freq: Two times a day (BID) | INTRAVENOUS | Status: DC
  Administered 2014-09-20 – 2014-09-22 (×5): 750 mg via INTRAVENOUS
  Filled 2014-09-19 (×7): qty 150

## 2014-09-19 MED ORDER — POTASSIUM CHLORIDE CRYS ER 20 MEQ PO TBCR
30.0000 meq | EXTENDED_RELEASE_TABLET | Freq: Every day | ORAL | Status: AC
  Administered 2014-09-19: 30 meq via ORAL
  Filled 2014-09-19 (×2): qty 1

## 2014-09-19 NOTE — Plan of Care (Signed)
Problem: Phase III Progression Outcomes Goal: Voiding independently Outcome: Completed/Met Date Met:  09/19/14 Pt continues to void independently with bedpan.

## 2014-09-19 NOTE — Progress Notes (Signed)
Notified pharmacy of patient's Vanc Trough.  Waiting on return call from pharmacy.  Will not give Vancomycin until pharmacy has confirmed.  Will continue to monitor patient.

## 2014-09-19 NOTE — Progress Notes (Signed)
ANTIBIOTIC CONSULT NOTE - FOLLOW UP  Pharmacy Consult for Vancomycin and Zosyn  Indication: cellulitis of the left hallux, and potential underlying osteomyelitis of the distal phalanx of the left hallux.  Allergies  Allergen Reactions  . Codeine Shortness Of Breath and Nausea And Vomiting  . Hydrocodone Shortness Of Breath and Nausea And Vomiting    Patient Measurements: Height: 5\' 8"  (172.7 cm) Weight: 179 lb 4.8 oz (81.33 kg) IBW/kg (Calculated) : 63.9  Vital Signs: BP: 139/66 mmHg (10/18 1427) Pulse Rate: 58 (10/18 1427) Intake/Output from previous day: 10/17 0701 - 10/18 0700 In: 650 [P.O.:600; IV Piggyback:50] Out: -  Intake/Output from this shift: Total I/O In: 480 [P.O.:480] Out: 200 [Urine:200]  Labs:  Recent Labs  09/17/14 0517 09/18/14 0552 09/19/14 0536  WBC 12.4* 14.0* 9.2  HGB 9.9* 9.4* 9.0*  PLT 217 266 262  CREATININE 0.93 0.87 0.87   Estimated Creatinine Clearance: 58.7 ml/min (by C-G formula based on Cr of 0.87).  Recent Labs  09/19/14 1653  VANCOTROUGH 9.8*     Microbiology: Recent Results (from the past 720 hour(s))  WOUND CULTURE     Status: None   Collection Time    09/16/14 12:17 AM      Result Value Ref Range Status   Specimen Description WOUND LEFT FOOT   Final   Special Requests NONE   Final   Gram Stain     Final   Value: NO WBC SEEN     NO SQUAMOUS EPITHELIAL CELLS SEEN     RARE GRAM POSITIVE COCCI     IN PAIRS     Performed at [facility]   Culture     Final   Value: MODERATE STAPHYLOCOCCUS AUREUS     Note: RIFAMPIN AND GENTAMICIN SHOULD NOT BE USED AS SINGLE DRUGS FOR TREATMENT OF STAPH INFECTIONS.     Performed at [facility]   Report Status 09/18/2014 FINAL   Final   Organism ID, Bacteria STAPHYLOCOCCUS AUREUS   Final  SURGICAL PCR SCREEN     Status: Abnormal   Collection Time    09/17/14  7:41 AM      Result Value Ref Range Status   MRSA, PCR NEGATIVE  NEGATIVE Final   Staphylococcus aureus  POSITIVE (*) NEGATIVE Final   Comment:            The Xpert SA Assay (FDA     approved for NASAL specimens     in patients over 421 years of age),     is one component of     a comprehensive surveillance     program.  Test performance has     been validated by [facility] for patients greater     than or equal to 501 year old.     It is not intended     to diagnose infection nor to     guide or monitor treatment.     RESULT CALLED TO, READ BACK BY AND VERIFIED WITH:     JENIFFER AT 1106 ON 09/17/14 BY S.VANHOORNE  WOUND CULTURE     Status: None   Collection Time    09/17/14 10:27 AM      Result Value Ref Range Status   Specimen Description TOE LEFT BIG   Final   Special Requests NONE   Final   Gram Stain     Final   Value: NO WBC SEEN     NO SQUAMOUS EPITHELIAL CELLS  SEEN     MODERATE GRAM POSITIVE COCCI     IN PAIRS IN CLUSTERS     Performed at [facility]   Culture     Final   Value: MODERATE STAPHYLOCOCCUS AUREUS     Note: RIFAMPIN AND GENTAMICIN SHOULD NOT BE USED AS SINGLE DRUGS FOR TREATMENT OF STAPH INFECTIONS.     Performed at [facility]   Report Status PENDING   Incomplete  ANAEROBIC CULTURE     Status: None   Collection Time    09/17/14 10:27 AM      Result Value Ref Range Status   Specimen Description TOE LEFT BIG   Final   Special Requests NONE   Final   Gram Stain     Final   Value: NO WBC SEEN     NO SQUAMOUS EPITHELIAL CELLS SEEN     MODERATE GRAM POSITIVE COCCI     IN PAIRS IN CLUSTERS     Performed at [facility]   Culture     Final   Value: NO ANAEROBES ISOLATED; CULTURE IN PROGRESS FOR 5 DAYS     Performed at [facility]   Report Status PENDING   Incomplete    Anti-infectives   Start     Dose/Rate Route Frequency Ordered Stop   09/16/14 1800  vancomycin (VANCOCIN) 1,250 mg in sodium chloride 0.9 % 250 mL IVPB     1,250 mg 166.7 mL/hr over 90 Minutes Intravenous Every 24 hours 09/16/14 0307     09/16/14  0800  piperacillin-tazobactam (ZOSYN) IVPB 3.375 g     3.375 g 12.5 mL/hr over 240 Minutes Intravenous Every 8 hours 09/16/14 0307     09/15/14 2345  vancomycin (VANCOCIN) IVPB 1000 mg/200 mL premix     1,000 mg 200 mL/hr over 60 Minutes Intravenous  Once 09/15/14 2330 09/16/14 0204   09/15/14 2330  piperacillin-tazobactam (ZOSYN) IVPB 3.375 g     3.375 g 100 mL/hr over 30 Minutes Intravenous  Once 09/15/14 2330 09/16/14 0056      Assessment: Okay for Protocol, renal stable.  Micro = MSSA to date.  Left great toe amputation planned for AM.  Trough below goal.  Goal of Therapy:  Vancomycin trough level 15-20 mcg/ml  Plan:  Zosyn 3.375gm IV every 8 hours. Follow-up micro data, labs, vitals.  Continue Vancomycin 1250mg  IV dose this PM and increase to 750mg  IV every 12 hours in AM. Measure antibiotic drug levels at steady state Follow up culture results   Mady GemmaHayes, Cyril Woodmansee R 09/19/2014,6:03 PM

## 2014-09-19 NOTE — Progress Notes (Signed)
ANTIBIOTIC CONSULT NOTE - FOLLOW UP  Pharmacy Consult for Vancomycin and Zosyn  Indication: cellulitis of the left hallux, and potential underlying osteomyelitis of the distal phalanx of the left hallux.  Allergies  Allergen Reactions  . Codeine Shortness Of Breath and Nausea And Vomiting  . Hydrocodone Shortness Of Breath and Nausea And Vomiting    Patient Measurements: Height: 5\' 8"  (172.7 cm) Weight: 179 lb 4.8 oz (81.33 kg) IBW/kg (Calculated) : 63.9  Vital Signs: Temp: 98.1 F (36.7 C) (10/18 0500) Temp Source: Oral (10/18 0500) BP: 134/58 mmHg (10/18 0936) Pulse Rate: 66 (10/18 0936) Intake/Output from previous day: 10/17 0701 - 10/18 0700 In: 650 [P.O.:600; IV Piggyback:50] Out: -  Intake/Output from this shift:    Labs:  Recent Labs  09/17/14 0517 09/18/14 0552 09/19/14 0536  WBC 12.4* 14.0* 9.2  HGB 9.9* 9.4* 9.0*  PLT 217 266 262  CREATININE 0.93 0.87 0.87   Estimated Creatinine Clearance: 58.7 ml/min (by C-G formula based on Cr of 0.87). No results found for this basename: VANCOTROUGH, VANCOPEAK, VANCORANDOM, GENTTROUGH, GENTPEAK, GENTRANDOM, TOBRATROUGH, TOBRAPEAK, TOBRARND, AMIKACINPEAK, AMIKACINTROU, AMIKACIN,  in the last 72 hours   Microbiology: Recent Results (from the past 720 hour(s))  WOUND CULTURE     Status: None   Collection Time    09/16/14 12:17 AM      Result Value Ref Range Status   Specimen Description WOUND LEFT FOOT   Final   Special Requests NONE   Final   Gram Stain     Final   Value: NO WBC SEEN     NO SQUAMOUS EPITHELIAL CELLS SEEN     RARE GRAM POSITIVE COCCI     IN PAIRS     Performed at [facility]   Culture     Final   Value: MODERATE STAPHYLOCOCCUS AUREUS     Note: RIFAMPIN AND GENTAMICIN SHOULD NOT BE USED AS SINGLE DRUGS FOR TREATMENT OF STAPH INFECTIONS.     Performed at [facility]   Report Status 09/18/2014 FINAL   Final   Organism ID, Bacteria STAPHYLOCOCCUS AUREUS   Final  SURGICAL PCR  SCREEN     Status: Abnormal   Collection Time    09/17/14  7:41 AM      Result Value Ref Range Status   MRSA, PCR NEGATIVE  NEGATIVE Final   Staphylococcus aureus POSITIVE (*) NEGATIVE Final   Comment:            The Xpert SA Assay (FDA     approved for NASAL specimens     in patients over 78 years of age),     is one component of     a comprehensive surveillance     program.  Test performance has     been validated by [facility] for patients greater     than or equal to 78 year old.     It is not intended     to diagnose infection nor to     guide or monitor treatment.     RESULT CALLED TO, READ BACK BY AND VERIFIED WITH:     JENIFFER AT 1106 ON 09/17/14 BY S.VANHOORNE  WOUND CULTURE     Status: None   Collection Time    09/17/14 10:27 AM      Result Value Ref Range Status   Specimen Description TOE LEFT BIG   Final   Special Requests NONE   Final   Gram Stain  Final   Value: NO WBC SEEN     NO SQUAMOUS EPITHELIAL CELLS SEEN     MODERATE GRAM POSITIVE COCCI     IN PAIRS IN CLUSTERS     Performed at [facility]   Culture     Final   Value: MODERATE STAPHYLOCOCCUS AUREUS     Note: RIFAMPIN AND GENTAMICIN SHOULD NOT BE USED AS SINGLE DRUGS FOR TREATMENT OF STAPH INFECTIONS.     Performed at [facility]   Report Status PENDING   Incomplete  ANAEROBIC CULTURE     Status: None   Collection Time    09/17/14 10:27 AM      Result Value Ref Range Status   Specimen Description TOE LEFT BIG   Final   Special Requests NONE   Final   Gram Stain     Final   Value: NO WBC SEEN     NO SQUAMOUS EPITHELIAL CELLS SEEN     MODERATE GRAM POSITIVE COCCI     IN PAIRS IN CLUSTERS     Performed at [facility]   Culture     Final   Value: NO ANAEROBES ISOLATED; CULTURE IN PROGRESS FOR 5 DAYS     Performed at [facility]   Report Status PENDING   Incomplete    Anti-infectives   Start     Dose/Rate Route Frequency Ordered Stop   09/16/14 1800   vancomycin (VANCOCIN) 1,250 mg in sodium chloride 0.9 % 250 mL IVPB     1,250 mg 166.7 mL/hr over 90 Minutes Intravenous Every 24 hours 09/16/14 0307     09/16/14 0800  piperacillin-tazobactam (ZOSYN) IVPB 3.375 g     3.375 g 12.5 mL/hr over 240 Minutes Intravenous Every 8 hours 09/16/14 0307     09/15/14 2345  vancomycin (VANCOCIN) IVPB 1000 mg/200 mL premix     1,000 mg 200 mL/hr over 60 Minutes Intravenous  Once 09/15/14 2330 09/16/14 0204   09/15/14 2330  piperacillin-tazobactam (ZOSYN) IVPB 3.375 g     3.375 g 100 mL/hr over 30 Minutes Intravenous  Once 09/15/14 2330 09/16/14 0056      Assessment: Okay for Protocol, renal stable.  Micro = MSSA to date.  Final ABX plan pending per amputation decision.  Goal of Therapy:  Vancomycin trough level 15-20 mcg/ml  Plan:  Zosyn 3.375gm IV every 8 hours. Follow-up micro data, labs, vitals.  Continue Vancomycin 1250mg  IV every 24 hours, trough this PM. Measure antibiotic drug levels at steady state Follow up culture results   Mady GemmaHayes, Ronan Duecker R 09/19/2014,11:42 AM

## 2014-09-19 NOTE — Progress Notes (Signed)
Subjective: Cassandra [name] is postoperative day 2 status post incision and drainage of abscess of left great toe.  She denies any nausea, vomiting, fever or chills.  Her daughter is present for today's visit.  Past Medical History  Diagnosis Date  . Hypertension   . Arthritis   . Depression   . Hyperlipidemia   . PFO (patent foramen ovale)     small  . H/O echocardiogram 2008    prolapse of post. mitral leaflet, tr MR, tr TR, EF >55%, mild concentric LVH  . H/O cardiovascular stress test 10/13/2007    low risk scan, no changes from previous test  . Heart palpitations     controlled on BB  . Hiatal hernia   . Cellulitis and abscess of toe of left foot 09/16/2014   Scheduled Meds: . atenolol  50 mg Oral Daily  . docusate sodium  100 mg Oral BID  . famotidine  20 mg Oral BID  . latanoprost  1 drop Both Eyes QHS  . piperacillin-tazobactam (ZOSYN)  IV  3.375 g Intravenous Q8H  . sertraline  100 mg Oral Daily  . simvastatin  10 mg Oral QHS  . vancomycin  1,250 mg Intravenous Q24H   Continuous Infusions:  PRN Meds:.acetaminophen, acetaminophen, albuterol, morphine injection, ondansetron (ZOFRAN) IV, ondansetron, polyvinyl alcohol, traMADol  Allergies  Allergen Reactions  . Codeine Shortness Of Breath and Nausea And Vomiting  . Hydrocodone Shortness Of Breath and Nausea And Vomiting   Past Surgical History  Procedure Laterality Date  . Cholecystectomy    . Cardiac catheterization  10/2002    normal coronary arteries, normal LV function  . Incision and drainage Right 07/21/2014    Procedure: INCISION AND DRAINAGE;  Surgeon: Dallas SchimkeBenjamin Ivan Leianna Barga, DPM;  Location: [facility];  Service: Podiatry;  Laterality: Right;  . Incision and drainage Left 09/17/2014    Procedure: INCISION AND DRAINAGE 1ST TOE LEFT FOOT;  Surgeon: Dallas SchimkeBenjamin Ivan Jeremey Bascom, DPM;  Location: [facility];  Service: Podiatry;  Laterality: Left;  . Bone biopsy Left 09/17/2014    Procedure: BONE BIOPSY;  Surgeon: Dallas SchimkeBenjamin Ivan  Alessio Bogan, DPM;  Location: [facility];  Service: Podiatry;  Laterality: Left;   Family History  Problem Relation Age of Onset  . Heart disease Mother   . Cancer Father   . Kidney disease Brother   . Hypertension Daughter   . Hyperlipidemia Son    Social History:  reports that she has never smoked. She has never used smokeless tobacco. She reports that she does not drink alcohol or use illicit drugs.  Physical Examination: Vital signs in last 24 hours:   Temp:  [98.1 F (36.7 C)-98.5 F (36.9 C)] 98.1 F (36.7 C) (10/18 0500) Pulse Rate:  [66-74] 66 (10/18 0936) Resp:  [18] 18 (10/18 0500) BP: (132-140)/(58-72) 134/58 mmHg (10/18 0936) SpO2:  [94 %-97 %] 94 % (10/18 0500)  Right foot:  A dressing is clean, dry and intact.  There is a full-thickness wound present along the medial aspect of the right first metatarsophalangeal joint.  The wound bed is granular.  There are no acute signs of infection present.  There is a full-thickness ulceration present along the distal aspect of the great toe.  The ulceration is granular.  There are no acute signs of infection present.  Dorsalis pedis pulse is palpable.  Posterior tibial pulse is palpable. The calf is soft and nontender.  Left foot:  A surgical dressing is clean, dry and intact.  Mild strikethrough is present  along the medial aspect of the left great toe.  There is persistent erythema of the left great toe.  The left great toenail is absent.  Packing is present in the surgical wounds dorsally and plantarly.  The packing was removed.  The wound probes to bone.  Appearance of the left great toe is unchanged from yesterday.  Dorsalis pedis pulse is palpable.  Posterior tibial pulses palpable.  Lab/Test Results:    Recent Labs  09/18/14 0552 09/19/14 0536  WBC 14.0* 9.2  HGB 9.4* 9.0*  HCT 28.4* 27.2*  PLT 266 262  NA 143 143  K 3.4* 3.4*  CL 108 107  CO2 25 27  BUN 14 15  CREATININE 0.87 0.87  GLUCOSE 93 99  CALCIUM 8.5 9.0     Recent Results (from the past 240 hour(s))  WOUND CULTURE     Status: None   Collection Time    09/16/14 12:17 AM      Result Value Ref Range Status   Specimen Description WOUND LEFT FOOT   Final   Special Requests NONE   Final   Gram Stain     Final   Value: NO WBC SEEN     NO SQUAMOUS EPITHELIAL CELLS SEEN     RARE GRAM POSITIVE COCCI     IN PAIRS     Performed at [facility]   Culture     Final   Value: MODERATE STAPHYLOCOCCUS AUREUS     Note: RIFAMPIN AND GENTAMICIN SHOULD NOT BE USED AS SINGLE DRUGS FOR TREATMENT OF STAPH INFECTIONS.     Performed at [facility]   Report Status 09/18/2014 FINAL   Final   Organism ID, Bacteria STAPHYLOCOCCUS AUREUS   Final  SURGICAL PCR SCREEN     Status: Abnormal   Collection Time    09/17/14  7:41 AM      Result Value Ref Range Status   MRSA, PCR NEGATIVE  NEGATIVE Final   Staphylococcus aureus POSITIVE (*) NEGATIVE Final   Comment:            The Xpert SA Assay (FDA     approved for NASAL specimens     in patients over 50 years of age),     is one component of     a comprehensive surveillance     program.  Test performance has     been validated by [employer] for patients greater     than or equal to 15 year old.     It is not intended     to diagnose infection nor to     guide or monitor treatment.     RESULT CALLED TO, READ BACK BY AND VERIFIED WITH:     JENIFFER AT 1106 ON 09/17/14 BY S.VANHOORNE  WOUND CULTURE     Status: None   Collection Time    09/17/14 10:27 AM      Result Value Ref Range Status   Specimen Description TOE LEFT BIG   Final   Special Requests NONE   Final   Gram Stain     Final   Value: NO WBC SEEN     NO SQUAMOUS EPITHELIAL CELLS SEEN     MODERATE GRAM POSITIVE COCCI     IN PAIRS IN CLUSTERS     Performed at [facility]   Culture     Final   Value: MODERATE STAPHYLOCOCCUS AUREUS     Note: RIFAMPIN AND  GENTAMICIN SHOULD NOT BE USED AS SINGLE DRUGS FOR TREATMENT  OF STAPH INFECTIONS.     [facility]   Report Status PENDING   Incomplete  ANAEROBIC CULTURE     Status: None   Collection Time    09/17/14 10:27 AM      Result Value Ref Range Status   Specimen Description TOE LEFT BIG   Final   Special Requests NONE   Final   Gram Stain     Final   Value: NO WBC SEEN     NO SQUAMOUS EPITHELIAL CELLS SEEN     MODERATE GRAM POSITIVE COCCI     IN PAIRS IN CLUSTERS     [facility]   Culture     Final   Value: NO ANAEROBES ISOLATED; CULTURE IN PROGRESS FOR 5 DAYS     [facility]   Report Status PENDING   Incomplete     No results found.  Assessment: 1.  Postoperative day two status post incision and drainage of left great toe and bone biopsy of distal phalanx of left great toe 2.  Cellulitis of the left hallux. 3.  Septic joint of the interphalangeal joint of the left hallux. 4.  Osteomyelitis of the distal phalanx of the left hallux. 5.  Stable wound an ulceration of the right hallux.  Plan: Given the clinical findings, I recommend amputation of the left great toe.  The benefits and risk of the procedure were explained to the patient.  All questions were answered.  No guarantees were given.  A written consent will be obtained.  She has been placed NPO after midnight.  Surgery is scheduled for 7:30 in the morning.    Jazmyne Beauchesne IVAN 09/19/2014, 1:11 PM

## 2014-09-19 NOTE — Progress Notes (Signed)
[facility] PROGRESS NOTE  Cassandra [name] WUJ:[id number] DOB: 08/19/1933 DOA: 09/15/2014 PCP: Colette RibasGOLDING, JOHN CABOT, MD    Code Status: Full code Family Communication: Discussed with patient and daughter Disposition Plan: Discharge when clinically appropriate   Consultants:  Podiatrist, Dr. Nolen MuMcKinney  Procedures:  09/17/14-Incision and drainage of left hallux abscess and foot; bone biopsy of the distal phalanx of the left hallux.  Antibiotics:  Zosyn 10/15>>  Vancomycin 10/15>>  HPI/Subjective: The patient has no new complaints. She does have some left foot soreness.  Objective: Filed Vitals:   09/19/14 1427  BP: 139/66  Pulse: 58  Temp:   Resp: 18   temperature 98.1. Oxygen saturation 99% on room air.  Intake/Output Summary (Last 24 hours) at 09/19/14 1436 Last data filed at 09/19/14 1201  Gross per 24 hour  Intake    720 ml  Output    200 ml  Net    520 ml   Filed Weights   09/15/14 2005 09/16/14 0306  Weight: 79.379 kg (175 lb) 81.33 kg (179 lb 4.8 oz)    Exam:   General:  Pleasant elderly woman sitting up in bed, in no acute distress.  Cardiovascular: S1, S2, with no murmurs rubs or gallops.  Respiratory:  Clear to auscultation bilaterally.  Abdomen: Positive bowel sounds, soft, nontender, nondistended.  Musculoskeletal/extremities: left foot with postoperative bandage off. Right toe with mildly erythematous incision site and significant decrease in edema and fluctuance compared to prior to the I&D. Open wound without malodorous drainage. Right foot with small healing ulceration on the first metatarsal plantar surface.  Neurologic: The patient is alert and oriented x3. Cranial nerves II through XII are intact.  Data Reviewed: Basic Metabolic Panel:  Recent Labs Lab 09/15/14 2345 09/17/14 0517 09/18/14 0552 09/19/14 0536  NA 136* 142 143 143  K 4.0 3.7 3.4* 3.4*  CL 100 107 108 107  CO2 25 25 25 27   GLUCOSE 115* 105* 93 99  BUN 20  16 14 15   CREATININE 0.91 0.93 0.87 0.87  CALCIUM 9.5 8.7 8.5 9.0   Liver Function Tests:  Recent Labs Lab 09/15/14 2345 09/17/14 0517  AST 14 13  ALT 13 11  ALKPHOS 107 104  BILITOT 1.2 0.7  PROT 7.2 6.3  ALBUMIN 3.4* 2.6*   No results found for this basename: LIPASE, AMYLASE,  in the last 168 hours No results found for this basename: AMMONIA,  in the last 168 hours CBC:  Recent Labs Lab 09/15/14 2345 09/17/14 0517 09/18/14 0552 09/19/14 0536  WBC 20.5* 12.4* 14.0* 9.2  NEUTROABS 18.0*  --   --   --   HGB 11.3* 9.9* 9.4* 9.0*  HCT 33.3* 29.8* 28.4* 27.2*  MCV 90.0 91.1 90.7 90.4  PLT 237 217 266 262   Cardiac Enzymes: No results found for this basename: CKTOTAL, CKMB, CKMBINDEX, TROPONINI,  in the last 168 hours BNP (last 3 results) No results found for this basename: PROBNP,  in the last 8760 hours CBG:  Recent Labs Lab 09/16/14 0110  GLUCAP 123*    Recent Results (from the past 240 hour(s))  WOUND CULTURE     Status: None   Collection Time    09/16/14 12:17 AM      Result Value Ref Range Status   Specimen Description WOUND LEFT FOOT   Final   Special Requests NONE   Final   Gram Stain     Final   Value: NO WBC SEEN     NO  SQUAMOUS EPITHELIAL CELLS SEEN     RARE GRAM POSITIVE COCCI     IN PAIRS     Performed at [facility]   Culture     Final   Value: MODERATE STAPHYLOCOCCUS AUREUS     Note: RIFAMPIN AND GENTAMICIN SHOULD NOT BE USED AS SINGLE DRUGS FOR TREATMENT OF STAPH INFECTIONS.     Performed at [facility]   Report Status 09/18/2014 FINAL   Final   Organism ID, Bacteria STAPHYLOCOCCUS AUREUS   Final  SURGICAL PCR SCREEN     Status: Abnormal   Collection Time    09/17/14  7:41 AM      Result Value Ref Range Status   MRSA, PCR NEGATIVE  NEGATIVE Final   Staphylococcus aureus POSITIVE (*) NEGATIVE Final   Comment:            The Xpert SA Assay (FDA     approved for NASAL specimens     in patients over 45 years of age),      is one component of     a comprehensive surveillance     program.  Test performance has     been validated by [employer] for patients greater     than or equal to 84 year old.     It is not intended     to diagnose infection nor to     guide or monitor treatment.     RESULT CALLED TO, READ BACK BY AND VERIFIED WITH:     JENIFFER AT 1106 ON 09/17/14 BY S.VANHOORNE  WOUND CULTURE     Status: None   Collection Time    09/17/14 10:27 AM      Result Value Ref Range Status   Specimen Description TOE LEFT BIG   Final   Special Requests NONE   Final   Gram Stain     Final   Value: NO WBC SEEN     NO SQUAMOUS EPITHELIAL CELLS SEEN     MODERATE GRAM POSITIVE COCCI     IN PAIRS IN CLUSTERS     Performed at [facility]   Culture     Final   Value: MODERATE STAPHYLOCOCCUS AUREUS     Note: RIFAMPIN AND GENTAMICIN SHOULD NOT BE USED AS SINGLE DRUGS FOR TREATMENT OF STAPH INFECTIONS.     Performed at [facility]   Report Status PENDING   Incomplete  ANAEROBIC CULTURE     Status: None   Collection Time    09/17/14 10:27 AM      Result Value Ref Range Status   Specimen Description TOE LEFT BIG   Final   Special Requests NONE   Final   Gram Stain     Final   Value: NO WBC SEEN     NO SQUAMOUS EPITHELIAL CELLS SEEN     MODERATE GRAM POSITIVE COCCI     IN PAIRS IN CLUSTERS     Performed at [facility]   Culture     Final   Value: NO ANAEROBES ISOLATED; CULTURE IN PROGRESS FOR 5 DAYS     Performed at [facility]   Report Status PENDING   Incomplete     Studies: No results found.  Scheduled Meds: . atenolol  50 mg Oral Daily  . docusate sodium  100 mg Oral BID  . famotidine  20 mg Oral BID  . latanoprost  1 drop Both Eyes QHS  .  piperacillin-tazobactam (ZOSYN)  IV  3.375 g Intravenous Q8H  . sertraline  100 mg Oral Daily  . simvastatin  10 mg Oral QHS  . vancomycin  1,250 mg Intravenous Q24H   Continuous Infusions:   Assessment and  plan:  Principal Problem:   Cellulitis and abscess of toe of left foot Active Problems:   Septic arthritis of interphalangeal joint of toe of left foot   Osteomyelitis of toe of left foot   Hypertension   Normocytic anemia   1. Staphylococcus aureus cellulitis/abscess/possible osteomyelitis of the left great/septic arthritis of the interphalangeal joint left hallux. Postoperative day #2, status post incision and drainage of left hallux and foot and bone biopsy of the distal phalanx of the left hallux. Wound culture prior to the operation is growing Staphylococcus aureus. Perioperative wound culture/bone biopsy is also consistent with Staphylococcus aureus. She is now afebrile. Overall, her white blood cell count has trended downward. Dr. Nolen MuMcKinney plans an amputation of the left great toe on 10/19. We'll continue vancomycin and Zosyn.  Normocytic anemia. This is likely the consequence of acute illness and possibly hemodilution from IV fluids. Also from mild perioperative blood loss. Anemia panel was ordered and results are pending. We'll continue to monitor for need of transfusion.  Hypertension. The dose of atenolol was decreased to 50 mg from 100 mg secondary to low-normal blood pressures. Blood pressure is currently stable and within normal limits.  Mild hypokalemia. Will continue to supplement.  Time spent: 20 minutes    Cassandra [name],Cassandra [name]  [facility] Pager [phone]. If 7PM-7AM, please contact night-coverage at [email or web address], password [email or web address] 09/19/2014, 2:36 PM  LOS: 4 days

## 2014-09-20 ENCOUNTER — Encounter (HOSPITAL_COMMUNITY): Payer: Self-pay | Admitting: *Deleted

## 2014-09-20 ENCOUNTER — Encounter (HOSPITAL_COMMUNITY)
Admission: EM | Disposition: A | Discharge status: Skilled Nursing Facility | Payer: Self-pay | Source: Home / Self Care | Attending: Internal Medicine

## 2014-09-20 ENCOUNTER — Encounter (HOSPITAL_COMMUNITY): Payer: Medicare Other | Admitting: Anesthesiology

## 2014-09-20 ENCOUNTER — Inpatient Hospital Stay (HOSPITAL_COMMUNITY): Payer: Medicare Other

## 2014-09-20 ENCOUNTER — Other Ambulatory Visit: Payer: Self-pay

## 2014-09-20 ENCOUNTER — Inpatient Hospital Stay (HOSPITAL_COMMUNITY): Payer: Medicare Other | Admitting: Anesthesiology

## 2014-09-20 HISTORY — PX: AMPUTATION: SHX166

## 2014-09-20 LAB — BASIC METABOLIC PANEL
Anion gap: 10 (ref 5–15)
BUN: 11 mg/dL (ref 6–23)
CALCIUM: 9.1 mg/dL (ref 8.4–10.5)
CO2: 29 meq/L (ref 19–32)
CREATININE: 0.86 mg/dL (ref 0.50–1.10)
Chloride: 106 mEq/L (ref 96–112)
GFR calc Af Amer: 73 mL/min — ABNORMAL LOW (ref >90)
GFR calc non Af Amer: 63 mL/min — ABNORMAL LOW (ref >90)
Glucose, Bld: 91 mg/dL (ref 70–99)
Potassium: 4 mEq/L (ref 3.7–5.3)
SODIUM: 145 meq/L (ref 137–147)

## 2014-09-20 LAB — URINALYSIS, ROUTINE W REFLEX MICROSCOPIC
Bilirubin Urine: NEGATIVE
Glucose, UA: NEGATIVE mg/dL
Hgb urine dipstick: NEGATIVE
KETONES UR: NEGATIVE mg/dL
LEUKOCYTES UA: NEGATIVE
NITRITE: NEGATIVE
PH: 6.5 (ref 5.0–8.0)
Protein, ur: NEGATIVE mg/dL
Urobilinogen, UA: 0.2 mg/dL (ref 0.0–1.0)

## 2014-09-20 LAB — WOUND CULTURE: Gram Stain: NONE SEEN

## 2014-09-20 LAB — MAGNESIUM: Magnesium: 2.1 mg/dL (ref 1.5–2.5)

## 2014-09-20 LAB — HEMOGLOBIN A1C
Hgb A1c MFr Bld: 5.6 % (ref <5.7)
Mean Plasma Glucose: 114 mg/dL (ref <117)

## 2014-09-20 SURGERY — AMPUTATION DIGIT
Anesthesia: Monitor Anesthesia Care | Site: Toe | Laterality: Left

## 2014-09-20 MED ORDER — LIDOCAINE HCL (PF) 1 % IJ SOLN
INTRAMUSCULAR | Status: AC
  Filled 2014-09-20: qty 5

## 2014-09-20 MED ORDER — FENTANYL CITRATE 0.05 MG/ML IJ SOLN
25.0000 ug | INTRAMUSCULAR | Status: AC
  Administered 2014-09-20 (×2): 25 ug via INTRAVENOUS

## 2014-09-20 MED ORDER — FENTANYL CITRATE 0.05 MG/ML IJ SOLN
INTRAMUSCULAR | Status: AC
  Filled 2014-09-20: qty 2

## 2014-09-20 MED ORDER — SUCCINYLCHOLINE CHLORIDE 20 MG/ML IJ SOLN
INTRAMUSCULAR | Status: AC
  Filled 2014-09-20: qty 1

## 2014-09-20 MED ORDER — MIDAZOLAM HCL 2 MG/2ML IJ SOLN
INTRAMUSCULAR | Status: AC
  Filled 2014-09-20: qty 2

## 2014-09-20 MED ORDER — LATANOPROST 0.005 % OP SOLN
OPHTHALMIC | Status: AC
  Filled 2014-09-20: qty 2.5

## 2014-09-20 MED ORDER — BUPIVACAINE HCL (PF) 0.5 % IJ SOLN
INTRAMUSCULAR | Status: DC | PRN
  Administered 2014-09-20: 10 mL

## 2014-09-20 MED ORDER — PROPOFOL 10 MG/ML IV BOLUS
INTRAVENOUS | Status: AC
  Filled 2014-09-20: qty 20

## 2014-09-20 MED ORDER — EPHEDRINE SULFATE 50 MG/ML IJ SOLN
INTRAMUSCULAR | Status: AC
  Filled 2014-09-20: qty 1

## 2014-09-20 MED ORDER — LACTATED RINGERS IV SOLN
INTRAVENOUS | Status: DC
  Administered 2014-09-20: 07:00:00 via INTRAVENOUS

## 2014-09-20 MED ORDER — ONDANSETRON HCL 4 MG/2ML IJ SOLN: 4.0000 mg | Freq: Once | INTRAMUSCULAR | Status: DC | PRN

## 2014-09-20 MED ORDER — 0.9 % SODIUM CHLORIDE (POUR BTL) OPTIME
TOPICAL | Status: DC | PRN
  Administered 2014-09-20: 1000 mL

## 2014-09-20 MED ORDER — VANCOMYCIN HCL IN DEXTROSE 750-5 MG/150ML-% IV SOLN
INTRAVENOUS | Status: AC
  Filled 2014-09-20: qty 150

## 2014-09-20 MED ORDER — BUPIVACAINE HCL (PF) 0.5 % IJ SOLN
INTRAMUSCULAR | Status: AC
  Filled 2014-09-20: qty 30

## 2014-09-20 MED ORDER — SODIUM CHLORIDE 0.9 % IJ SOLN
INTRAMUSCULAR | Status: AC
  Filled 2014-09-20: qty 10

## 2014-09-20 MED ORDER — PROPOFOL INFUSION 10 MG/ML OPTIME
INTRAVENOUS | Status: DC | PRN
  Administered 2014-09-20: 50 ug/kg/min via INTRAVENOUS

## 2014-09-20 MED ORDER — LACTATED RINGERS IV SOLN
INTRAVENOUS | Status: DC | PRN
  Administered 2014-09-20: 07:00:00 via INTRAVENOUS

## 2014-09-20 MED ORDER — MIDAZOLAM HCL 5 MG/5ML IJ SOLN
INTRAMUSCULAR | Status: DC | PRN
  Administered 2014-09-20 (×2): 1 mg via INTRAVENOUS

## 2014-09-20 MED ORDER — FENTANYL CITRATE 0.05 MG/ML IJ SOLN: 25.0000 ug | INTRAMUSCULAR | Status: DC | PRN

## 2014-09-20 MED ORDER — BACITRACIN 50000 UNITS IM SOLR
INTRAMUSCULAR | Status: AC
  Filled 2014-09-20: qty 1

## 2014-09-20 MED ORDER — FENTANYL CITRATE 0.05 MG/ML IJ SOLN
INTRAMUSCULAR | Status: DC | PRN
  Administered 2014-09-20 (×2): 50 ug via INTRAVENOUS

## 2014-09-20 MED ORDER — MIDAZOLAM HCL 2 MG/2ML IJ SOLN
1.0000 mg | INTRAMUSCULAR | Status: DC | PRN
  Administered 2014-09-20: 2 mg via INTRAVENOUS
  Filled 2014-09-20: qty 2

## 2014-09-20 MED ORDER — ARTIFICIAL TEARS OP OINT
TOPICAL_OINTMENT | OPHTHALMIC | Status: AC
  Filled 2014-09-20: qty 3.5

## 2014-09-20 MED ORDER — SODIUM CHLORIDE 0.9 % IR SOLN
Status: DC | PRN
  Administered 2014-09-20: 08:00:00

## 2014-09-20 MED ORDER — LIDOCAINE HCL (CARDIAC) 10 MG/ML IV SOLN
INTRAVENOUS | Status: DC | PRN
  Administered 2014-09-20: 50 mg via INTRAVENOUS

## 2014-09-20 SURGICAL SUPPLY — 44 items
BAG HAMPER (MISCELLANEOUS) ×2 IMPLANT
BANDAGE ELASTIC 4 VELCRO NS (GAUZE/BANDAGES/DRESSINGS) ×2 IMPLANT
BANDAGE ESMARK 4X12 BL STRL LF (DISPOSABLE) ×1 IMPLANT
BLADE 15 SAFETY STRL DISP (BLADE) ×5 IMPLANT
BNDG CMPR 12X4 ELC STRL LF (DISPOSABLE) ×1
BNDG CONFORM 2 STRL LF (GAUZE/BANDAGES/DRESSINGS) ×2 IMPLANT
BNDG ESMARK 4X12 BLUE STRL LF (DISPOSABLE) ×2
BNDG GAUZE ELAST 4 BULKY (GAUZE/BANDAGES/DRESSINGS) ×2 IMPLANT
CLOTH BEACON ORANGE TIMEOUT ST (SAFETY) ×2 IMPLANT
COVER LIGHT HANDLE STERIS (MISCELLANEOUS) ×4 IMPLANT
CUFF TOURNIQUET SINGLE 18IN (TOURNIQUET CUFF) ×2 IMPLANT
DECANTER SPIKE VIAL GLASS SM (MISCELLANEOUS) ×2 IMPLANT
DRSG ADAPTIC 3X8 NADH LF (GAUZE/BANDAGES/DRESSINGS) ×2 IMPLANT
ELECT REM PT RETURN 9FT ADLT (ELECTROSURGICAL) ×2
ELECTRODE REM PT RTRN 9FT ADLT (ELECTROSURGICAL) ×1 IMPLANT
GAUZE PACKING 2X5 YD STRL (GAUZE/BANDAGES/DRESSINGS) ×1 IMPLANT
GAUZE PACKING IODOFORM 2 (PACKING) ×1 IMPLANT
GAUZE SPONGE 4X4 12PLY STRL (GAUZE/BANDAGES/DRESSINGS) ×2 IMPLANT
GLOVE BIO SURGEON STRL SZ7.5 (GLOVE) ×2 IMPLANT
GLOVE BIOGEL PI IND STRL 7.5 (GLOVE) IMPLANT
GLOVE BIOGEL PI INDICATOR 7.5 (GLOVE) ×1
GLOVE EXAM NITRILE MD LF STRL (GLOVE) ×1 IMPLANT
GLOVE SURG SS PI 7.5 STRL IVOR (GLOVE) ×1 IMPLANT
GOWN STRL REUS W/TWL LRG LVL3 (GOWN DISPOSABLE) ×4 IMPLANT
KIT ROOM TURNOVER APOR (KITS) ×2 IMPLANT
MANIFOLD NEPTUNE II (INSTRUMENTS) ×2 IMPLANT
NDL HYPO 18GX1.5 BLUNT FILL (NEEDLE) IMPLANT
NDL HYPO 27GX1-1/4 (NEEDLE) ×2 IMPLANT
NEEDLE HYPO 18GX1.5 BLUNT FILL (NEEDLE) ×2 IMPLANT
NEEDLE HYPO 27GX1-1/4 (NEEDLE) ×4 IMPLANT
NS IRRIG 1000ML POUR BTL (IV SOLUTION) ×2 IMPLANT
PACK BASIC LIMB (CUSTOM PROCEDURE TRAY) ×2 IMPLANT
PAD ARMBOARD 7.5X6 YLW CONV (MISCELLANEOUS) ×2 IMPLANT
SET BASIN LINEN APH (SET/KITS/TRAYS/PACK) ×2 IMPLANT
SOL PREP PROV IODINE SCRUB 4OZ (MISCELLANEOUS) ×2 IMPLANT
SPONGE LAP 18X18 X RAY DECT (DISPOSABLE) ×2 IMPLANT
SUT PROLENE 4 0 PS 2 18 (SUTURE) ×2 IMPLANT
SUT VIC AB 4-0 PS2 27 (SUTURE) ×1 IMPLANT
SUT VICRYL AB 3-0 FS1 BRD 27IN (SUTURE) ×1 IMPLANT
SWAB CULTURE LIQ STUART DBL (MISCELLANEOUS) ×1 IMPLANT
SYR 30ML LL (SYRINGE) ×1 IMPLANT
SYRINGE CONTROL L 12CC (SYRINGE) ×4 IMPLANT
SYRINGE CONTROL LL 12CC (SYRINGE) ×2 IMPLANT
TUBE ANAEROBIC PORT A CUL  W/M (MISCELLANEOUS) ×1 IMPLANT

## 2014-09-20 NOTE — Addendum Note (Signed)
Addendum created 09/20/14 1130 by Earleen NewportAmy A Adams, CRNA   Modules edited: Charges VN

## 2014-09-20 NOTE — Progress Notes (Signed)
[facility] PROGRESS NOTE  Judieth KeensBetty R Kirlin ZOX:[id number] DOB: 05/15/1933 DOA: 09/15/2014 PCP: Colette RibasGOLDING, JOHN CABOT, MD    Code Status: Full code Family Communication: Discussed with patient and daughter Disposition Plan: Discharge when clinically appropriate, possibly to skilled nursing facility   Consultants:  Podiatrist, Dr. Nolen MuMcKinney  Procedures:  09/20/14-amputation of the left foot great toe hallux, per Dr. Nolen MuMcKinney  09/17/14-Incision and drainage of left hallux abscess and foot; bone biopsy of the distal phalanx of the left hallux.  Antibiotics:  Zosyn 10/15>>  Vancomycin 10/15>>  HPI/Subjective: The patient is status post amputation of the left great toe hallux this morning. She has no complaints other than mild soreness.  Objective: Filed Vitals:   09/20/14 1429  BP: 164/68  Pulse: 78  Temp: 99 F (37.2 C)  Resp: 18   temperature 98.1. Oxygen saturation 96% on room air  Intake/Output Summary (Last 24 hours) at 09/20/14 1551 Last data filed at 09/20/14 1241  Gross per 24 hour  Intake   1900 ml  Output      2 ml  Net   1898 ml   Filed Weights   09/15/14 2005 09/16/14 0306  Weight: 79.379 kg (175 lb) 81.33 kg (179 lb 4.8 oz)    Exam:   General:  Pleasant elderly woman sitting up in bed, in no acute distress.  Cardiovascular: S1, S2, with no murmurs rubs or gallops.  Respiratory:  Clear to auscultation bilaterally.  Abdomen: Positive bowel sounds, soft, nontender, nondistended.  Musculoskeletal/extremities: Both feet with bandages in place, not taken off. The patient is able to with her toes upon command. (Exam on 09/19/14 Right toe with mildly erythematous incision site and significant decrease in edema and fluctuance compared to prior to the I&D. Open wound without malodorous drainage. Right foot with small healing ulceration on the first metatarsal plantar surface.)  Neurologic: The patient is alert and oriented x3. Cranial nerves II  through XII are intact.  Data Reviewed: Basic Metabolic Panel:  Recent Labs Lab 09/15/14 2345 09/17/14 0517 09/18/14 0552 09/19/14 0536 09/20/14 0527  NA 136* 142 143 143 145  K 4.0 3.7 3.4* 3.4* 4.0  CL 100 107 108 107 106  CO2 25 25 25 27 29   GLUCOSE 115* 105* 93 99 91  BUN 20 16 14 15 11   CREATININE 0.91 0.93 0.87 0.87 0.86  CALCIUM 9.5 8.7 8.5 9.0 9.1  MG  --   --   --   --  2.1   Liver Function Tests:  Recent Labs Lab 09/15/14 2345 09/17/14 0517  AST 14 13  ALT 13 11  ALKPHOS 107 104  BILITOT 1.2 0.7  PROT 7.2 6.3  ALBUMIN 3.4* 2.6*   No results found for this basename: LIPASE, AMYLASE,  in the last 168 hours No results found for this basename: AMMONIA,  in the last 168 hours CBC:  Recent Labs Lab 09/15/14 2345 09/17/14 0517 09/18/14 0552 09/19/14 0536  WBC 20.5* 12.4* 14.0* 9.2  NEUTROABS 18.0*  --   --   --   HGB 11.3* 9.9* 9.4* 9.0*  HCT 33.3* 29.8* 28.4* 27.2*  MCV 90.0 91.1 90.7 90.4  PLT 237 217 266 262   Cardiac Enzymes: No results found for this basename: CKTOTAL, CKMB, CKMBINDEX, TROPONINI,  in the last 168 hours BNP (last 3 results) No results found for this basename: PROBNP,  in the last 8760 hours CBG:  Recent Labs Lab 09/16/14 0110  GLUCAP 123*    Recent Results (from the  past 240 hour(s))  WOUND CULTURE     Status: None   Collection Time    09/16/14 12:17 AM      Result Value Ref Range Status   Specimen Description WOUND LEFT FOOT   Final   Special Requests NONE   Final   Gram Stain     Final   Value: NO WBC SEEN     NO SQUAMOUS EPITHELIAL CELLS SEEN     RARE GRAM POSITIVE COCCI     IN PAIRS     Performed at [facility]   Culture     Final   Value: MODERATE STAPHYLOCOCCUS AUREUS     Note: RIFAMPIN AND GENTAMICIN SHOULD NOT BE USED AS SINGLE DRUGS FOR TREATMENT OF STAPH INFECTIONS.     Performed at [facility]   Report Status 09/18/2014 FINAL   Final   Organism ID, Bacteria STAPHYLOCOCCUS AUREUS    Final  SURGICAL PCR SCREEN     Status: Abnormal   Collection Time    09/17/14  7:41 AM      Result Value Ref Range Status   MRSA, PCR NEGATIVE  NEGATIVE Final   Staphylococcus aureus POSITIVE (*) NEGATIVE Final   Comment:            The Xpert SA Assay (FDA     approved for NASAL specimens     in patients over 64 years of age),     is one component of     a comprehensive surveillance     program.  Test performance has     been validated by [employer] for patients greater     than or equal to 54 year old.     It is not intended     to diagnose infection nor to     guide or monitor treatment.     RESULT CALLED TO, READ BACK BY AND VERIFIED WITH:     JENIFFER AT 1106 ON 09/17/14 BY S.VANHOORNE  WOUND CULTURE     Status: None   Collection Time    09/17/14 10:27 AM      Result Value Ref Range Status   Specimen Description TOE LEFT BIG   Final   Special Requests NONE   Final   Gram Stain     Final   Value: NO WBC SEEN     NO SQUAMOUS EPITHELIAL CELLS SEEN     MODERATE GRAM POSITIVE COCCI     IN PAIRS IN CLUSTERS     Performed at [facility]   Culture     Final   Value: MODERATE STAPHYLOCOCCUS AUREUS     Note: RIFAMPIN AND GENTAMICIN SHOULD NOT BE USED AS SINGLE DRUGS FOR TREATMENT OF STAPH INFECTIONS.     Performed at [facility]   Report Status 09/20/2014 FINAL   Final   Organism ID, Bacteria STAPHYLOCOCCUS AUREUS   Final  ANAEROBIC CULTURE     Status: None   Collection Time    09/17/14 10:27 AM      Result Value Ref Range Status   Specimen Description TOE LEFT BIG   Final   Special Requests NONE   Final   Gram Stain     Final   Value: NO WBC SEEN     NO SQUAMOUS EPITHELIAL CELLS SEEN     MODERATE GRAM POSITIVE COCCI     IN PAIRS IN CLUSTERS     Performed at [facility]  Culture     Final   Value: NO ANAEROBES ISOLATED; CULTURE IN PROGRESS FOR 5 DAYS     [facility]   Report Status PENDING   Incomplete      Studies: Dg Foot Complete Left  09/20/2014   CLINICAL DATA:  Status post amputation of the left great toe for osteomyelitis on 09/20/2014.  EXAM: LEFT FOOT - COMPLETE 3+ VIEW  COMPARISON:  Plain films left foot 09/17/2014.  FINDINGS: The great toe has been amputated. No fracture or other acute bony or joint abnormality is identified. Bandaging is noted.  IMPRESSION: Status post amputation of the left great toe.  No acute finding.   Electronically Signed   By: Drusilla Kannerhomas  Dalessio M.D.   On: 09/20/2014 09:38    Scheduled Meds: . atenolol  50 mg Oral Daily  . docusate sodium  100 mg Oral BID  . famotidine  20 mg Oral BID  . latanoprost  1 drop Both Eyes QHS  . piperacillin-tazobactam (ZOSYN)  IV  3.375 g Intravenous Q8H  . sertraline  100 mg Oral Daily  . simvastatin  10 mg Oral QHS  . vancomycin  750 mg Intravenous Q12H   Continuous Infusions:   Assessment and plan:  Principal Problem:   Cellulitis and abscess of toe of left foot Active Problems:   Septic arthritis of interphalangeal joint of toe of left foot   Osteomyelitis of toe of left foot   Hypertension   Normocytic anemia   1. Staphylococcus aureus cellulitis/abscess/ osteomyelitis of the left great/septic arthritis of the interphalangeal joint left hallux. Postoperative day #0-status post amputation of the hallux, left foot. Postoperative day #2, status post incision and drainage of left hallux and foot and bone biopsy of the distal phalanx of the left hallux. Wound cultures/bone biopsy are growing Staphylococcus aureus. She is afebrile. Overall, her white blood cell count has trended downward to within normal limits. Further recommendations and postoperative management per Dr. Nolen MuMcKinney. It is likely that the patient will need short-term skilled nursing facility placement for appropriate wound care and rehabilitation. This was discussed with the patient who appears to be accepting this.  Normocytic anemia. This is likely the  consequence of acute illness and possibly hemodilution from IV fluids. Also from mild perioperative blood loss. Anemia panel was ordered and revealed a total iron of 22, TIBC of 160, ferritin of 231, folate 15.5, and vitamin B12 at 1469.  We'll continue to monitor for need of transfusion.  Hypertension. The dose of atenolol was decreased to 50 mg from 100 mg secondary to low-normal blood pressures. Will increase the dose if her blood pressure increases.  Mild hypokalemia. Will continue to supplement as needed. Current serum potassium within normal limits.  Time spent: 20 minutes    [facility] Thompson PeakFISHER,Antione Obar  [facility] Pager [phone]. If 7PM-7AM, please contact night-coverage at [email or web address], password [email or web address] 09/20/2014, 3:51 PM  LOS: 5 days

## 2014-09-20 NOTE — Addendum Note (Signed)
Addendum created 09/20/14 0933 by Earleen NewportAmy A Adams, CRNA   Modules edited: Notes Section   Notes Section:  File: [id number]

## 2014-09-20 NOTE — Anesthesia Procedure Notes (Signed)
Procedure Name: MAC Date/Time: 09/20/2014 7:34 AM Performed by: Pernell DupreADAMS, Brandee Markin A Pre-anesthesia Checklist: Patient identified, Timeout performed, Emergency Drugs available, Suction available and Patient being monitored Oxygen Delivery Method: Simple face mask

## 2014-09-20 NOTE — Anesthesia Postprocedure Evaluation (Signed)
  Anesthesia Post-op Note  Patient: Cassandra [name]  Procedure(s) Performed: Procedure(s): AMPUTATION DIGIT (LEFT GREAT TOE) (Left)  Patient Location: PACU  Anesthesia Type:MAC  Level of Consciousness: awake, alert , oriented and patient cooperative  Airway and Oxygen Therapy: Patient Spontanous Breathing and Patient connected to nasal cannula oxygen  Post-op Pain: none  Post-op Assessment: Post-op Vital signs reviewed, Patient's Cardiovascular Status Stable, Respiratory Function Stable, Patent Airway, No signs of Nausea or vomiting and Pain level controlled  Post-op Vital Signs: Reviewed and stable  Last Vitals:  Filed Vitals:   09/20/14 0730  BP: 152/67  Pulse:   Temp:   Resp: 28    Complications: No apparent anesthesia complications

## 2014-09-20 NOTE — Anesthesia Preprocedure Evaluation (Signed)
Anesthesia Evaluation  Patient identified by MRN, date of birth, ID band Patient awake    Reviewed: Allergy & Precautions, H&P , NPO status , Patient's Chart, lab work & pertinent test results, reviewed documented beta blocker date and time   Airway Mallampati: I TM Distance: >3 FB     Dental  (+) Edentulous Upper, Edentulous Lower   Pulmonary  breath sounds clear to auscultation        Cardiovascular hypertension, Pt. on medications and Pt. on home beta blockers + Valvular Problems/Murmurs (hx patent foramen ovale) MVP Rhythm:Regular Rate:Normal     Neuro/Psych PSYCHIATRIC DISORDERS Depression    GI/Hepatic hiatal hernia,   Endo/Other    Renal/GU      Musculoskeletal  (+) Arthritis -,   Abdominal   Peds  Hematology   Anesthesia Other Findings   Reproductive/Obstetrics                           Anesthesia Physical Anesthesia Plan  ASA: III  Anesthesia Plan: MAC   Post-op Pain Management:    Induction: Intravenous  Airway Management Planned: Simple Face Mask  Additional Equipment:   Intra-op Plan:   Post-operative Plan:   Informed Consent: I have reviewed the patients History and Physical, chart, labs and discussed the procedure including the risks, benefits and alternatives for the proposed anesthesia with the patient or authorized representative who has indicated his/her understanding and acceptance.     Plan Discussed with:   Anesthesia Plan Comments:         Anesthesia Quick Evaluation

## 2014-09-20 NOTE — Transfer of Care (Signed)
Immediate Anesthesia Transfer of Care Note  Patient: Cassandra [name]  Procedure(s) Performed: Procedure(s): AMPUTATION DIGIT (LEFT GREAT TOE) (Left)  Patient Location: PACU  Anesthesia Type:MAC  Level of Consciousness: awake, alert , oriented and patient cooperative  Airway & Oxygen Therapy: Patient Spontanous Breathing and Patient connected to nasal cannula oxygen  Post-op Assessment: Report given to PACU RN and Post -op Vital signs reviewed and stable  Post vital signs: Reviewed and stable  Complications: No apparent anesthesia complications

## 2014-09-20 NOTE — Anesthesia Postprocedure Evaluation (Signed)
  Anesthesia Post-op Note  Patient: Cassandra [name]  Procedure(s) Performed: Procedure(s): AMPUTATION DIGIT (LEFT GREAT TOE) (Left)  Patient Location: PACU  Anesthesia Type:MAC  Level of Consciousness: awake, alert , oriented and patient cooperative  Airway and Oxygen Therapy: Patient Spontanous Breathing and Patient connected to nasal cannula oxygen  Post-op Pain: none  Post-op Assessment: Post-op Vital signs reviewed, Patient's Cardiovascular Status Stable, Respiratory Function Stable, Patent Airway, No signs of Nausea or vomiting and Pain level controlled  Post-op Vital Signs: Reviewed and stable  Last Vitals:  Filed Vitals:   09/20/14 0845  BP: 146/68  Pulse: 58  Temp: 37 C  Resp: 11    Complications: No apparent anesthesia complications

## 2014-09-20 NOTE — Progress Notes (Signed)
Notified Dr. Sherrie MustacheFisher of the patient constant complaint of abdominal pressure.  Voiced to her that the patient is voiding and that a bladder scan was done and it was approx. 130 cc left.  Patient Doesn't  Complain of pain shje just states that is is uncomfortable.  New orders given.  The patient had a BM yesterday and stated that she had 2 today.

## 2014-09-20 NOTE — Op Note (Signed)
OPERATIVE NOTE  DATE OF PROCEDURE:  09/20/2014  SURGEON:   Dallas SchimkeBenjamin Ivan Adael Culbreath, DPM  OR STAFF:   Circulator: Larwance RoteHolly Renee Protzek, RN Scrub Person: Donald Poreeborah A Blackwell, CST Circulator Assistant: Lizabeth LeydenBetty M Ashley, RN   PREOPERATIVE DIAGNOSIS:   Osteomyelitis of the left great toe  POSTOPERATIVE DIAGNOSIS: Same  PROCEDURE: Amputation of the hallux, left foot  ANESTHESIA:  Monitor Anesthesia Care   HEMOSTASIS:   Pneumatic ankle tourniquet set at 250 mmHg  ESTIMATED BLOOD LOSS:   Minimal (<5 cc)  MATERIALS USED:  None  INJECTABLES: Marcaine 0.5% plain; 10mL  PATHOLOGY:   1.  Aerobic culture 2.  Anaerobic culture  COMPLICATIONS:   None  INDICATIONS:  Nonhealing, infected left great toe.  DESCRIPTION OF THE PROCEDURE:   The patient was brought to the operating room and placed on the operative table in the supine position.  A pneumatic ankle tourniquet was applied to the patient's ankle.  Following sedation, the surgical site was anesthetized with 0.5% Marcaine plain.  The foot was then prepped, scrubbed, and draped in the usual sterile technique.  The foot was elevated and the pneumatic ankle tourniquet inflated to 250 mmHg.    Attention was directed to the left hallux.  A racket-shaped incision was performed encompassing the left great toe circumferentially.  Dissection was continued deep down to the level of the metatarsophalangeal joint.  The metatarsophalangeal joint was disarticulated and the toe was passed from the operative field.  The toe was sent to pathology for evaluation.  The tibial and fibular sesamoids were identified and removed.  The wound was irrigated with copious amounts of bacitracin solution.  The surgical wound was inspected and found to be free of any remaining abnormal soft tissue pathology.  The head of the first metatarsal bone was evaluated.  The first metatarsal was found to be of normal color with no erosive changes present.  The proximal,  dorsal and proximal, plantar aspect of the incision were reapproximated with 4-0 Prolene in a simple suture technique.  The central aspect of the surgical wound was left open and packed with packing gauze.  A sterile compressive dressing was applied to the left foot.  The pneumatic ankle tourniquet was deflated and a prompt hyperemic response was noted to all digits of the left foot.   The patient tolerated the procedure well.  The patient was then transferred to PACU with vital signs stable and vascular status intact to all toes of the operative foot.  Following a period of postoperative monitoring, the patient will be transferred back to her room.

## 2014-09-21 ENCOUNTER — Encounter (HOSPITAL_COMMUNITY): Payer: Self-pay | Admitting: Podiatry

## 2014-09-21 LAB — CBC
HCT: 30.1 % — ABNORMAL LOW (ref 36.0–46.0)
HEMOGLOBIN: 10 g/dL — AB (ref 12.0–15.0)
MCH: 29.8 pg (ref 26.0–34.0)
MCHC: 33.2 g/dL (ref 30.0–36.0)
MCV: 89.6 fL (ref 78.0–100.0)
PLATELETS: 328 10*3/uL (ref 150–400)
RBC: 3.36 MIL/uL — AB (ref 3.87–5.11)
RDW: 13.2 % (ref 11.5–15.5)
WBC: 8.4 10*3/uL (ref 4.0–10.5)

## 2014-09-21 MED ORDER — ATENOLOL 25 MG PO TABS
100.0000 mg | ORAL_TABLET | Freq: Every day | ORAL | Status: DC
  Administered 2014-09-22: 100 mg via ORAL
  Filled 2014-09-21: qty 4

## 2014-09-21 NOTE — Progress Notes (Signed)
Subjective: [name] is postoperative day 1 status post amputation of the left great toe.  She denies any vomiting, fever or chills.  She experienced some nausea this morning, but it has resolved.  Her pain is controlled.  Her daughter and other members of her family are present for today's visit.  Past Medical History  Diagnosis Date  . Hypertension   . Arthritis   . Depression   . Hyperlipidemia   . PFO (patent foramen ovale)     small  . H/O echocardiogram 2008    prolapse of post. mitral leaflet, tr MR, tr TR, EF >55%, mild concentric LVH  . H/O cardiovascular stress test 10/13/2007    low risk scan, no changes from previous test  . Heart palpitations     controlled on BB  . Hiatal hernia   . Cellulitis and abscess of toe of left foot 09/16/2014   Scheduled Meds: . atenolol  50 mg Oral Daily  . docusate sodium  100 mg Oral BID  . famotidine  20 mg Oral BID  . latanoprost  1 drop Both Eyes QHS  . piperacillin-tazobactam (ZOSYN)  IV  3.375 g Intravenous Q8H  . sertraline  100 mg Oral Daily  . simvastatin  10 mg Oral QHS  . vancomycin  750 mg Intravenous Q12H   Continuous Infusions:  PRN Meds:.acetaminophen, acetaminophen, albuterol, morphine injection, ondansetron (ZOFRAN) IV, ondansetron, polyvinyl alcohol, traMADol  Allergies  Allergen Reactions  . Codeine Shortness Of Breath and Nausea And Vomiting  . Hydrocodone Shortness Of Breath and Nausea And Vomiting   Past Surgical History  Procedure Laterality Date  . Cholecystectomy    . Cardiac catheterization  10/2002    normal coronary arteries, normal LV function  . Incision and drainage Right 07/21/2014    Procedure: INCISION AND DRAINAGE;  Surgeon: Dallas SchimkeBenjamin Ivan Perrion Diesel, DPM;  Location: [facility];  Service: Podiatry;  Laterality: Right;  . Incision and drainage Left 09/17/2014    Procedure: INCISION AND DRAINAGE 1ST TOE LEFT FOOT;  Surgeon: Dallas SchimkeBenjamin Ivan Lismary Kiehn, DPM;  Location: [facility];  Service: Podiatry;   Laterality: Left;  . Bone biopsy Left 09/17/2014    Procedure: BONE BIOPSY;  Surgeon: Dallas SchimkeBenjamin Ivan Saheed Carrington, DPM;  Location: [facility];  Service: Podiatry;  Laterality: Left;   Family History  Problem Relation Age of Onset  . Heart disease Mother   . Cancer Father   . Kidney disease Brother   . Hypertension Daughter   . Hyperlipidemia Son    Social History:  reports that she has never smoked. She has never used smokeless tobacco. She reports that she does not drink alcohol or use illicit drugs.  Physical Examination: Vital signs in last 24 hours:   Temp:  [97.9 F (36.6 C)-99.2 F (37.3 C)] 98.3 F (36.8 C) (10/20 0454) Pulse Rate:  [59-85] 85 (10/19 2344) Resp:  [18-20] 20 (10/20 0454) BP: (138-164)/(59-80) 138/75 mmHg (10/20 0454) SpO2:  [94 %-99 %] 94 % (10/20 0454)  Right foot:  A dressing is clean, dry and intact.  There is a full-thickness wound present along the medial aspect of the right first metatarsophalangeal joint.  The wound bed is granular.  There are no acute signs of infection present.  There is a full-thickness ulceration present along the distal aspect of the great toe.  The ulceration is granular.  There are no acute signs of infection present.  Dorsalis pedis pulse is palpable.  Posterior tibial pulse is palpable. The calf is soft and  nontender.  Left foot:  A surgical dressing is clean, dry and intact.  Mild strikethrough is present along the distal aspect of the dressing overlying the amputation site.  There is some persistent erythema of the dorsum of the left foot.  This has improved from yesterday.  The left hallux is surgically absent.  The dorsal and plantar aspects of the incision are well approximated with sutures in place.  The central aspect of the incision remains open and packed with iodoform gauze.  No purulence was encountered.  The head of the first metatarsal is visible.  Dorsalis pedis pulse is palpable.  Posterior tibial pulses palpable.  Lab/Test  Results:    Recent Labs  09/19/14 0536 09/20/14 0527 09/21/14 0809  WBC 9.2  --  8.4  HGB 9.0*  --  10.0*  HCT 27.2*  --  30.1*  PLT 262  --  328  NA 143 145  --   K 3.4* 4.0  --   CL 107 106  --   CO2 27 29  --   BUN 15 11  --   CREATININE 0.87 0.86  --   GLUCOSE 99 91  --   CALCIUM 9.0 9.1  --     Recent Results (from the past 240 hour(s))  WOUND CULTURE     Status: None   Collection Time    09/16/14 12:17 AM      Result Value Ref Range Status   Specimen Description WOUND LEFT FOOT   Final   Special Requests NONE   Final   Gram Stain     Final   Value: NO WBC SEEN     NO SQUAMOUS EPITHELIAL CELLS SEEN     RARE GRAM POSITIVE COCCI     IN PAIRS     Performed at [facility]   Culture     Final   Value: MODERATE STAPHYLOCOCCUS AUREUS     Note: RIFAMPIN AND GENTAMICIN SHOULD NOT BE USED AS SINGLE DRUGS FOR TREATMENT OF STAPH INFECTIONS.     Performed at [facility]   Report Status 09/18/2014 FINAL   Final   Organism ID, Bacteria STAPHYLOCOCCUS AUREUS   Final  SURGICAL PCR SCREEN     Status: Abnormal   Collection Time    09/17/14  7:41 AM      Result Value Ref Range Status   MRSA, PCR NEGATIVE  NEGATIVE Final   Staphylococcus aureus POSITIVE (*) NEGATIVE Final   Comment:            The Xpert SA Assay (FDA     approved for NASAL specimens     in patients over 61 years of age),     is one component of     a comprehensive surveillance     program.  Test performance has     been validated by [facility] for patients greater     than or equal to 36 year old.     It is not intended     to diagnose infection nor to     guide or monitor treatment.     RESULT CALLED TO, READ BACK BY AND VERIFIED WITH:     JENIFFER AT 1106 ON 09/17/14 BY S.VANHOORNE  WOUND CULTURE     Status: None   Collection Time    09/17/14 10:27 AM      Result Value Ref Range Status   Specimen Description TOE LEFT BIG   Final  Special Requests NONE   Final   Gram Stain      Final   Value: NO WBC SEEN     NO SQUAMOUS EPITHELIAL CELLS SEEN     MODERATE GRAM POSITIVE COCCI     IN PAIRS IN CLUSTERS     [facility]formed at [facility]   Culture     Final   Value: MODERATE STAPHYLOCOCCUS AUREUS     Note: RIFAMPIN AND GENTAMICIN SHOULD NOT BE USED AS SINGLE DRUGS FOR TREATMENT OF STAPH INFECTIONS.     Performed at [facility]   Report Status 09/20/2014 FINAL   Final   Organism ID, Bacteria STAPHYLOCOCCUS AUREUS   Final  ANAEROBIC CULTURE     Status: None   Collection Time    09/17/14 10:27 AM      Result Value Ref Range Status   Specimen Description TOE LEFT BIG   Final   Special Requests NONE   Final   Gram Stain     Final   Value: NO WBC SEEN     NO SQUAMOUS EPITHELIAL CELLS SEEN     MODERATE GRAM POSITIVE COCCI     IN PAIRS IN CLUSTERS     Performed at [facility]   Culture     Final   Value: NO ANAEROBES ISOLATED; CULTURE IN PROGRESS FOR 5 DAYS     Performed at [facility]   Report Status PENDING   Incomplete  ANAEROBIC CULTURE     Status: None   Collection Time    09/20/14  8:05 AM      Result Value Ref Range Status   Specimen Description WOUND LEFT GREAT TOE   Final   Special Requests VANCOMYCIN 750mg  AT 0600   Final   Gram Stain     Final   Value: FEW WBC PRESENT,BOTH PMN AND MONONUCLEAR     NO SQUAMOUS EPITHELIAL CELLS SEEN     RARE GRAM POSITIVE COCCI     IN PAIRS     Performed at [facility]   Culture     Final   Value: NO ANAEROBES ISOLATED; CULTURE IN PROGRESS FOR 5 DAYS     Performed at [facility]   Report Status PENDING   Incomplete  WOUND CULTURE     Status: None   Collection Time    09/20/14  8:05 AM      Result Value Ref Range Status   Specimen Description WOUND LEFT GREAT TOE   Final   Special Requests VANCOMYCIN 750mg  AT 0600   Final   Gram Stain     Final   Value: FEW WBC PRESENT, PREDOMINANTLY PMN     NO SQUAMOUS EPITHELIAL CELLS SEEN     FEW GRAM POSITIVE COCCI     IN  PAIRS     Performed at [facility]   Culture     Final   Value: MODERATE STAPHYLOCOCCUS AUREUS     Note: RIFAMPIN AND GENTAMICIN SHOULD NOT BE USED AS SINGLE DRUGS FOR TREATMENT OF STAPH INFECTIONS.     Performed at [facility]   Report Status PENDING   Incomplete     Dg Foot Complete Left  09/20/2014   CLINICAL DATA:  Status post amputation of the left great toe for osteomyelitis on 09/20/2014.  EXAM: LEFT FOOT - COMPLETE 3+ VIEW  COMPARISON:  Plain films left foot 09/17/2014.  FINDINGS: The great toe has been amputated. No fracture or other acute bony  or joint abnormality is identified. Bandaging is noted.  IMPRESSION: Status post amputation of the left great toe.  No acute finding.   Electronically Signed   By: Drusilla Kanner M.D.   On: 09/20/2014 09:38    Assessment: 1.  Postoperative day one status post amputation of the left great toe for treatment of septic joint of the interphalangeal joint and osteomyelitis. 2.  Stable ulceration of the right hallux.  Plan: A Betadine dressing was applied to the right foot.  The amputation site of the left hallux was packed with packing gauze and covered with a dry, sterile dressing.  I will change her dressing again tomorrow.  If there is continued improvement, she may transition to a rehab center.  I recommend a 10 day course of Augmentin 500 mg BID.  Her dressings will need to be changed daily.  The surgical wound of the left foot will need to be packed with plain packing gauze.  A Betadine dressing should be applied to the right foot.  She is to remain NWB on the left foot.  She may participate with physical therapy using a walker while remaining NWB on her left foot.  She may transfer to a bedside toilet.   Dave Mannes IVAN 09/21/2014, 12:45 PM

## 2014-09-21 NOTE — Progress Notes (Signed)
[facility] PROGRESS NOTE  Cassandra [name] RUE:[id number] DOB: January 01, 1933 DOA: 09/15/2014 PCP: Colette Ribas, MD  Assessment/Plan: 1. Staphylococcus aureus cellulitis/abscess/ osteomyelitis of the left great/septic arthritis of the interphalangeal joint left hallux.  Postoperative day #1-status post amputation of the hallux, left foot. Postoperative day #3, status post incision and drainage of left hallux and foot and bone biopsy of the distal phalanx of the left hallux. Wound cultures/bone biopsy are growing Staphylococcus aureus. Max temp 99.2 last 24 hours. No leukocytosis. Spoke with Dr Nolen Mu who recommends Augmentin 500mg  BID at discharge for 1 week.  Further recommendations and postoperative management per Dr. Nolen Mu. Request SW consult for likely short term placement for wound care/rehab  Normocytic anemia.   stable at this point. No s/sx active bleeding.  This is likely the consequence of acute illness and possibly hemodilution from IV fluids. Also from mild perioperative blood loss. Anemia panel was ordered and revealed a total iron of 22, TIBC of 160, ferritin of 231, folate 15.5, and vitamin B12 at 1469. We'll continue to monitor for need of transfusion.  Hypertension.  Home atenolol was decreased to 50 mg from 100 mg secondary to low-normal blood pressures. Will resume home dose today as blood pressure increasing .   Mild hypokalemia.   resolved 09/20/14. Will continue to supplement as needed.      Code Status: full Family Communication: daughter and grandchildren at bedside Disposition Plan: likely need placement, ready hopefully tomorrow   Consultants:  Dr Nolen Mu podiatry  Procedures: 09/20/14-amputation of the left foot great toe hallux, per Dr. Nolen Mu  09/17/14-Incision and drainage of left hallux abscess and foot; bone biopsy of the distal phalanx of the left hallux.   Antibiotics: Zosyn 10/15>>  Vancomycin 10/15>>   HPI/Subjective: Sitting  up in bed eating lunch. Denies pain/discomfort   Objective: Filed Vitals:   09/21/14 0454  BP: 138/75  Pulse:   Temp: 98.3 F (36.8 C)  Resp: 20    Intake/Output Summary (Last 24 hours) at 09/21/14 1325 Last data filed at 09/21/14 1159  Gross per 24 hour  Intake    240 ml  Output    800 ml  Net   -560 ml   Filed Weights   09/15/14 2005 09/16/14 0306  Weight: 79.379 kg (175 lb) 81.33 kg (179 lb 4.8 oz)    Exam:   General:  Well nourished appears comfortable  Cardiovascular: RRR, i hear no MGR   Respiratory: normal effort BS clear bilaterally to auscultation  Abdomen: non-distended +BS non-tender to palpation  Musculoskeletal: left foot with ACE wrap over kerlex with toe tip exposed. Dressing dry and intact. No odor. Toes to left foot warm to touch. kerlex dressing to right foot with toes exposed. Dressing dry and intact, no drainage or odor. Toes warm to touch bilaterally   Data Reviewed: Basic Metabolic Panel:  Recent Labs Lab 09/15/14 2345 09/17/14 0517 09/18/14 0552 09/19/14 0536 09/20/14 0527  NA 136* 142 143 143 145  K 4.0 3.7 3.4* 3.4* 4.0  CL 100 107 108 107 106  CO2 25 25 25 27 29   GLUCOSE 115* 105* 93 99 91  BUN 20 16 14 15 11   CREATININE 0.91 0.93 0.87 0.87 0.86  CALCIUM 9.5 8.7 8.5 9.0 9.1  MG  --   --   --   --  2.1   Liver Function Tests:  Recent Labs Lab 09/15/14 2345 09/17/14 0517  AST 14 13  ALT 13 11  ALKPHOS 107 104  BILITOT 1.2 0.7  PROT 7.2 6.3  ALBUMIN 3.4* 2.6*   No results found for this basename: LIPASE, AMYLASE,  in the last 168 hours No results found for this basename: AMMONIA,  in the last 168 hours CBC:  Recent Labs Lab 09/15/14 2345 09/17/14 0517 09/18/14 0552 09/19/14 0536 09/21/14 0809  WBC 20.5* 12.4* 14.0* 9.2 8.4  NEUTROABS 18.0*  --   --   --   --   HGB 11.3* 9.9* 9.4* 9.0* 10.0*  HCT 33.3* 29.8* 28.4* 27.2* 30.1*  MCV 90.0 91.1 90.7 90.4 89.6  PLT 237 217 266 262 328   Cardiac Enzymes: No  results found for this basename: CKTOTAL, CKMB, CKMBINDEX, TROPONINI,  in the last 168 hours BNP (last 3 results) No results found for this basename: PROBNP,  in the last 8760 hours CBG:  Recent Labs Lab 09/16/14 0110  GLUCAP 123*    Recent Results (from the past 240 hour(s))  WOUND CULTURE     Status: None   Collection Time    09/16/14 12:17 AM      Result Value Ref Range Status   Specimen Description WOUND LEFT FOOT   Final   Special Requests NONE   Final   Gram Stain     Final   Value: NO WBC SEEN     NO SQUAMOUS EPITHELIAL CELLS SEEN     RARE GRAM POSITIVE COCCI     IN PAIRS     Performed at [facility]   Culture     Final   Value: MODERATE STAPHYLOCOCCUS AUREUS     Note: RIFAMPIN AND GENTAMICIN SHOULD NOT BE USED AS SINGLE DRUGS FOR TREATMENT OF STAPH INFECTIONS.     Performed at [facility]   Report Status 09/18/2014 FINAL   Final   Organism ID, Bacteria STAPHYLOCOCCUS AUREUS   Final  SURGICAL PCR SCREEN     Status: Abnormal   Collection Time    09/17/14  7:41 AM      Result Value Ref Range Status   MRSA, PCR NEGATIVE  NEGATIVE Final   Staphylococcus aureus POSITIVE (*) NEGATIVE Final   Comment:            The Xpert SA Assay (FDA     approved for NASAL specimens     in patients over 24 years of age),     is one component of     a comprehensive surveillance     program.  Test performance has     been validated by [facility] [employer] for patients greater     than or equal to 30 year old.     It is not intended     to diagnose infection nor to     guide or monitor treatment.     RESULT CALLED TO, READ BACK BY AND VERIFIED WITH:     JENIFFER AT 1106 ON 09/17/14 BY S.VANHOORNE  WOUND CULTURE     Status: None   Collection Time    09/17/14 10:27 AM      Result Value Ref Range Status   Specimen Description TOE LEFT BIG   Final   Special Requests NONE   Final   Gram Stain     Final   Value: NO WBC SEEN     NO SQUAMOUS EPITHELIAL CELLS SEEN      MODERATE GRAM POSITIVE COCCI     IN PAIRS IN CLUSTERS     Performed at [facility]   Culture     Final   Value: MODERATE STAPHYLOCOCCUS AUREUS     Note: RIFAMPIN AND GENTAMICIN SHOULD NOT BE USED AS SINGLE DRUGS FOR TREATMENT OF STAPH INFECTIONS.     [facility]formed at [facility]   Report Status 09/20/2014 FINAL   Final   Organism ID, Bacteria STAPHYLOCOCCUS AUREUS   Final  ANAEROBIC CULTURE     Status: None   Collection Time    09/17/14 10:27 AM      Result Value Ref Range Status   Specimen Description TOE LEFT BIG   Final   Special Requests NONE   Final   Gram Stain     Final   Value: NO WBC SEEN     NO SQUAMOUS EPITHELIAL CELLS SEEN     MODERATE GRAM POSITIVE COCCI     IN PAIRS IN CLUSTERS     [facility]formed at [facility]   Culture     Final   Value: NO ANAEROBES ISOLATED; CULTURE IN PROGRESS FOR 5 DAYS     Performed at [facility]   Report Status PENDING   Incomplete  ANAEROBIC CULTURE     Status: None   Collection Time    09/20/14  8:05 AM      Result Value Ref Range Status   Specimen Description WOUND LEFT GREAT TOE   Final   Special Requests VANCOMYCIN 750mg  AT 0600   Final   Gram Stain     Final   Value: FEW WBC PRESENT,BOTH PMN AND MONONUCLEAR     NO SQUAMOUS EPITHELIAL CELLS SEEN     RARE GRAM POSITIVE COCCI     IN PAIRS     [facility]formed at [facility]   Culture     Final   Value: NO ANAEROBES ISOLATED; CULTURE IN PROGRESS FOR 5 DAYS     [facility]formed at [facility]   Report Status PENDING   Incomplete  WOUND CULTURE     Status: None   Collection Time    09/20/14  8:05 AM      Result Value Ref Range Status   Specimen Description WOUND LEFT GREAT TOE   Final   Special Requests VANCOMYCIN 750mg  AT 0600   Final   Gram Stain     Final   Value: FEW WBC PRESENT, PREDOMINANTLY PMN     NO SQUAMOUS EPITHELIAL CELLS SEEN     FEW GRAM POSITIVE COCCI     IN PAIRS     [facility]formed at [facility]   Culture     Final    Value: MODERATE STAPHYLOCOCCUS AUREUS     Note: RIFAMPIN AND GENTAMICIN SHOULD NOT BE USED AS SINGLE DRUGS FOR TREATMENT OF STAPH INFECTIONS.     Performed at [facility]   Report Status PENDING   Incomplete     Studies: Dg Foot Complete Left  09/20/2014   CLINICAL DATA:  Status post amputation of the left great toe for osteomyelitis on 09/20/2014.  EXAM: LEFT FOOT - COMPLETE 3+ VIEW  COMPARISON:  Plain films left foot 09/17/2014.  FINDINGS: The great toe has been amputated. No fracture or other acute bony or joint abnormality is identified. Bandaging is noted.  IMPRESSION: Status post amputation of the left great toe.  No acute finding.   Electronically Signed   By: Drusilla Kannerhomas  Dalessio M.D.   On: 09/20/2014 09:38    Scheduled Meds: . atenolol  50 mg Oral Daily  . docusate sodium  100 mg Oral BID  . famotidine  20 mg Oral BID  . latanoprost  1 drop Both Eyes QHS  . piperacillin-tazobactam (ZOSYN)  IV  3.375 g Intravenous Q8H  . sertraline  100 mg Oral Daily  . simvastatin  10 mg Oral QHS  . vancomycin  750 mg Intravenous Q12H   Continuous Infusions:   Principal Problem:   Cellulitis and abscess of toe of left foot Active Problems:   Hypertension   Septic arthritis of interphalangeal joint of toe of left foot   Osteomyelitis of toe of left foot   Normocytic anemia    Time spent: 35 minutes    [facility] Medical CenterBLACK,Tashera Montalvo M  [facility] Pager [phone]. If 7PM-7AM, please contact night-coverage at [email or web address], password [email or web address] 09/21/2014, 1:25 PM  LOS: 6 days

## 2014-09-21 NOTE — Anesthesia Postprocedure Evaluation (Signed)
  Anesthesia Post-op Note  Patient: Cassandra [name]  Procedure(s) Performed: Procedure(s): AMPUTATION DIGIT (LEFT GREAT TOE) (Left)  Patient Location: Room 302  Anesthesia Type:MAC  Level of Consciousness: awake, alert , oriented and patient cooperative  Airway and Oxygen Therapy: Patient Spontanous Breathing  Post-op Pain: mild  Post-op Assessment: Post-op Vital signs reviewed, Patient's Cardiovascular Status Stable, Respiratory Function Stable, Patent Airway, No signs of Nausea or vomiting and Pain level controlled  Post-op Vital Signs: Reviewed and stable  Last Vitals:  Filed Vitals:   09/21/14 0454  BP: 138/75  Pulse:   Temp: 36.8 C  Resp: 20    Complications: No apparent anesthesia complications

## 2014-09-21 NOTE — Addendum Note (Signed)
Addendum created 09/21/14 0900 by Earleen NewportAmy A Kambre Messner, CRNA   Modules edited: Notes Section   Notes Section:  File: [id number]

## 2014-09-21 NOTE — Plan of Care (Signed)
Problem: Phase III Progression Outcomes Goal: Pain controlled on oral analgesia Outcome: Completed/Met Date Met:  09/21/14 Patient denies any pain in left foot after receiving Tramadol PO.

## 2014-09-21 NOTE — Progress Notes (Signed)
The patient was seen and examined. She was discussed with nurse practitioner Ms. Black. Agree with her assessment and plan. She was also discussed with podiatrist, Dr. Nolen MuMcKinney. The patient is improving clinically status post surgeries. The plan is to discharge her to skilled nursing facility for rehabilitation and wound care on 09/22/14 when a bed is available. We'll discontinue IV antibiotics upon discharge and start Augmentin orally following discharge.

## 2014-09-22 ENCOUNTER — Inpatient Hospital Stay
Admission: RE | Admit: 2014-09-22 | Discharge: 2014-10-07 | Disposition: A | Discharge status: Home / Self Care | Payer: Medicare Other | Source: Ambulatory Visit | Attending: Internal Medicine | Admitting: Internal Medicine

## 2014-09-22 ENCOUNTER — Other Ambulatory Visit: Payer: Self-pay | Admitting: *Deleted

## 2014-09-22 ENCOUNTER — Encounter (HOSPITAL_COMMUNITY): Payer: Self-pay | Admitting: Podiatry

## 2014-09-22 DIAGNOSIS — M869 Osteomyelitis, unspecified: Secondary | ICD-10-CM

## 2014-09-22 DIAGNOSIS — Z9889 Other specified postprocedural states: Principal | ICD-10-CM

## 2014-09-22 LAB — BASIC METABOLIC PANEL
ANION GAP: 10 (ref 5–15)
BUN: 7 mg/dL (ref 6–23)
CO2: 29 mEq/L (ref 19–32)
Calcium: 9.5 mg/dL (ref 8.4–10.5)
Chloride: 102 mEq/L (ref 96–112)
Creatinine, Ser: 0.91 mg/dL (ref 0.50–1.10)
GFR, EST AFRICAN AMERICAN: 68 mL/min — AB (ref >90)
GFR, EST NON AFRICAN AMERICAN: 58 mL/min — AB (ref >90)
Glucose, Bld: 86 mg/dL (ref 70–99)
POTASSIUM: 4.2 meq/L (ref 3.7–5.3)
SODIUM: 141 meq/L (ref 137–147)

## 2014-09-22 LAB — CBC
HCT: 31.8 % — ABNORMAL LOW (ref 36.0–46.0)
Hemoglobin: 10.6 g/dL — ABNORMAL LOW (ref 12.0–15.0)
MCH: 29.9 pg (ref 26.0–34.0)
MCHC: 33.3 g/dL (ref 30.0–36.0)
MCV: 89.6 fL (ref 78.0–100.0)
PLATELETS: 396 10*3/uL (ref 150–400)
RBC: 3.55 MIL/uL — AB (ref 3.87–5.11)
RDW: 13.2 % (ref 11.5–15.5)
WBC: 10.7 10*3/uL — AB (ref 4.0–10.5)

## 2014-09-22 LAB — ANAEROBIC CULTURE: GRAM STAIN: NONE SEEN

## 2014-09-22 LAB — WOUND CULTURE

## 2014-09-22 MED ORDER — AMOXICILLIN-POT CLAVULANATE 500-125 MG PO TABS: 1.0000 | ORAL_TABLET | Freq: Two times a day (BID) | ORAL | Status: DC

## 2014-09-22 MED ORDER — TRAMADOL HCL 50 MG PO TABS: ORAL_TABLET | ORAL | Status: DC

## 2014-09-22 MED ORDER — DSS 100 MG PO CAPS: 100.0000 mg | ORAL_CAPSULE | Freq: Two times a day (BID) | ORAL | Status: DC

## 2014-09-22 MED ORDER — TRAMADOL HCL 50 MG PO TABS: 50.0000 mg | ORAL_TABLET | Freq: Four times a day (QID) | ORAL | Status: DC | PRN

## 2014-09-22 NOTE — Discharge Instructions (Signed)
Her dressings will need to be changed daily. The surgical wound of the left foot will need to be packed with plain packing gauze. A Betadine dressing should be applied to the right foot. She is to remain NWB on the left foot. She may participate with physical therapy using a walker while remaining NWB on her left foot. She may transfer to a bedside toilet.

## 2014-09-22 NOTE — Discharge Summary (Signed)
Physician Discharge Summary  Cassandra KeensBetty R Bainter ZOX:[id number] DOB: 12/04/1932 DOA: 09/15/2014  PCP: Colette RibasGOLDING, JOHN CABOT, MD  Admit date: 09/15/2014 Discharge date: 09/22/2014  Time spent: 40 minutes  Recommendations for Outpatient Follow-up:  1. Follow up with Dr Nolen MuMcKinney as scheduled  2. Discharge to snf for rehab and dressing changes. Non-weight bearing on left, ambulate with walker. Dressing changes as below. Recommend daily BP monitoring as BP medications adjusted.   Discharge Diagnoses:  Principal Problem:   Cellulitis and abscess of toe of left foot Active Problems:   Hypertension   Septic arthritis of interphalangeal joint of toe of left foot   Osteomyelitis of toe of left foot   Normocytic anemia   Discharge Condition: stable  Diet recommendation: heart healthy  Filed Weights   09/15/14 2005 09/16/14 0306  Weight: 79.379 kg (175 lb) 81.33 kg (179 lb 4.8 oz)    History of present illness:  Cassandra [name] is a 80 y.o. female with a past medical history of hypertension, hypercholesterolemia, who underwent incision and drainage of an abscess in the right foot in August 2015. She went for followup with her podiatrist 09/15/14 and was noted to have swelling of the left first toe. Patient  somewhat vague regarding the time of onset. She did not answer specifically. She reported that all of a sudden on day prior to presentation on 09/16/14 her toe became worse. Dr. Nolen MuMcKinney, apparently obtain a x-ray in his office and referred her for MRI. And, then because her toe was swelling up she decided to come in to the hospital. She had some pain in that toe, which was worse with walking. She was unable to quantify the pain. s described as a dull pain. She had fever with a temperature up to 99.7, with some chills. No radiation of the pain. Denied any other symptoms. She was worried about losing that toe.  Hospital Course:  1. Staphylococcus aureus cellulitis/abscess/ osteomyelitis of the  left great/septic arthritis of the interphalangeal joint left hallux.  Postoperative day #2 at discharge-status post amputation of the hallux, left foot. Postoperative day #4 at discharge, status post incision and drainage of left hallux and foot and bone biopsy of the distal phalanx of the left hallux. Wound cultures/bone biopsy are growing Staphylococcus aureus. Provided with Vanc and zosyn for 6 days.  Afebrile for 24 hours at discharge. Spoke with Dr Nolen MuMckinney who recommended Augmentin 500mg  BID at discharge for 10 days. Further recommendations and postoperative management per Dr. Nolen MuMcKinney and in instructions of this discharge note.   Normocytic anemia.  Stable during this hospitalization. No s/sx active bleeding. Anemia panel  revealed a total iron of 22, TIBC of 160, ferritin of 231, folate 15.5, and vitamin B12 at 1469. Recommend OP follow up with PCP  Hypertension.  Home atenolol was decreased to 50 mg from 100 mg secondary to low-normal blood pressures. BP at discharge 151/71. Will resume home dose at discharge. Recommend monitoring of BP  Mild hypokalemia.  resolved 09/20/14.   Procedures: 09/20/14-amputation of the left foot great toe hallux, per Dr. Nolen MuMcKinney  09/17/14-Incision and drainage of left hallux abscess and foot; bone biopsy of the distal phalanx of the left hallux.  Consultations:  Dr. Nolen MuMcKinney podiatry  Discharge Exam: Filed Vitals:   09/22/14 0605  BP: 151/59  Pulse:   Temp: 97.8 F (36.6 C)  Resp: 20    General: well nourished appears comfortable Cardiovascular: RRR No MGR  Respiratory: normal effort BS clear bilaterally no wheeze MS: both  feet with dressings that are dry and intact. Exposed toes warm to touch bilaterally.   Discharge Instructions You were cared for by a hospitalist during your hospital stay. If you have any questions about your discharge medications or the care you received while you were in the hospital after you are discharged, you can  call the unit and asked to speak with the hospitalist on call if the hospitalist that took care of you is not available. Once you are discharged, your primary care physician will handle any further medical issues. Please note that NO REFILLS for any discharge medications will be authorized once you are discharged, as it is imperative that you return to your primary care physician (or establish a relationship with a primary care physician if you do not have one) for your aftercare needs so that they can reassess your need for medications and monitor your lab values.   Current Discharge Medication List    START taking these medications   Details  amoxicillin-clavulanate (AUGMENTIN) 500-125 MG per tablet Take 1 tablet (500 mg total) by mouth 2 (two) times daily. Qty: 20 tablet, Refills: 0      CONTINUE these medications which have NOT CHANGED   Details  aspirin 81 MG chewable tablet Chew 81 mg by mouth daily.    atenolol (TENORMIN) 100 MG tablet Take 1 tablet by mouth daily.    Biotin 5000 MCG CAPS Take 10,000 mcg by mouth daily.     Calcium Carbonate-Vitamin D (CALCIUM + D) 600-200 MG-UNIT TABS Take 1 tablet by mouth daily.    Coenzyme Q10 (CO Q 10) 10 MG CAPS Take 10 mg by mouth daily.     latanoprost (XALATAN) 0.005 % ophthalmic solution Place 1 drop into both eyes at bedtime.    ranitidine (ZANTAC) 150 MG tablet Take 150 mg by mouth 2 (two) times daily.    sertraline (ZOLOFT) 100 MG tablet Take 100 mg by mouth daily.     silver sulfADIAZINE (SILVADENE) 1 % cream Apply 1 application topically 2 (two) times daily.    SIMBRINZA 1-0.2 % SUSP Place 1 drop into both eyes 3 (three) times daily.    simvastatin (ZOCOR) 10 MG tablet Take 10 mg by mouth at bedtime.    carboxymethylcellulose (REFRESH PLUS) 0.5 % SOLN Place 1 drop into both eyes 3 (three) times daily as needed (Dry Eyes).    Cyanocobalamin (VITAMIN B-12 IJ) Inject as directed every 30 (thirty) days.    naproxen (NAPROSYN)  500 MG tablet Take 500 mg by mouth daily. For pain    traMADol (ULTRAM) 50 MG tablet Take 50-100 mg by mouth every 6 (six) hours as needed. For pain      STOP taking these medications     Multiple Vitamin (MULTIVITAMIN WITH MINERALS) TABS tablet        Allergies  Allergen Reactions  . Codeine Shortness Of Breath and Nausea And Vomiting  . Hydrocodone Shortness Of Breath and Nausea And Vomiting   Follow-up Information   Follow up with Dallas Schimke, DPM.   Specialty:  Podiatry   Contact information:   [address] [place] [address] [phone]        The results of significant diagnostics from this hospitalization (including imaging, microbiology, ancillary and laboratory) are listed below for reference.    Significant Diagnostic Studies: Mr Foot Left W Wo Contrast  09/16/2014   CLINICAL DATA:  Great toe pain and swelling for 1 week. Toenail fell off 1 year ago. Chronic  drainage. SAPHO syndrome.  EXAM: MRI OF THE LEFT FOREFOOT WITHOUT AND WITH CONTRAST  TECHNIQUE: Multiplanar, multisequence MR imaging was performed both before and after administration of intravenous contrast.  CONTRAST:  15mL MULTIHANCE GADOBENATE DIMEGLUMINE 529 MG/ML IV SOLN  COMPARISON:  09/16/2014 radiographs  FINDINGS: Articular erosion of the base of the distal phalanx great toe all with abnormal soft tissue enhancement, abnormal fluid in the interphalangeal joint, and fluid extension from the joint especially medially and in the plantar region forming a cavity suspicious for abscess but possibly draining to the medial scan. Abnormal erosion and irregularity of the distal articular surface of the proximal phalanx with edema signal and enhancement in the proximal phalanx and surrounding soft tissues.  Low grade edema tracks along the plantar musculature of the foot. Lisfranc ligament intact and Lisfranc alignment normal. The remaining toes and bony structures appear intact.  IMPRESSION: 1.  Suspected septic arthritis of the interphalangeal joint with extensive extracapsular surrounding fluid collection compatible with abscess tracking in the toe and possibly draining to the medial skin. Surrounding soft tissue enhancement and bony destructive findings along the interphalangeal joint. The bony findings are clearly worse compared to the prior CT scan of 07/18/2014. This is not a typical joint to be involved in SAPHO syndrome, and the patient's age makes chronic recurrent multifocal osteomyelitis (CRMO) unlikely. Gout arthropathy is considered unlikely given the degree of fluid collections. The joint should probably be drained.   Electronically Signed   By: Herbie Baltimore M.D.   On: 09/16/2014 09:02   Dg Foot Complete Left  09/20/2014   CLINICAL DATA:  Status post amputation of the left great toe for osteomyelitis on 09/20/2014.  EXAM: LEFT FOOT - COMPLETE 3+ VIEW  COMPARISON:  Plain films left foot 09/17/2014.  FINDINGS: The great toe has been amputated. No fracture or other acute bony or joint abnormality is identified. Bandaging is noted.  IMPRESSION: Status post amputation of the left great toe.  No acute finding.   Electronically Signed   By: Drusilla Kanner M.D.   On: 09/20/2014 09:38   Dg Foot Complete Left  09/17/2014   CLINICAL DATA:  Postop incision and drainage great toe  EXAM: LEFT FOOT - COMPLETE 3+ VIEW  COMPARISON:  09/16/2014  FINDINGS: Soft tissue swelling of the great toe. There is a wound along the medial aspect of the toe. Erosive changes in the interphalangeal joints suggestive of osteomyelitis unchanged. No new findings.  IMPRESSION: Postop changes as above. Findings likely related to septic arthritis of the first interphalangeal joint.   Electronically Signed   By: Marlan Palau M.D.   On: 09/17/2014 11:42   Dg Toe Great Left  09/16/2014   CLINICAL DATA:  Left great toe swelling and drainage. Initial encounter.  EXAM: LEFT GREAT TOE  COMPARISON:  Left forefoot MRI -  09/15/2014  FINDINGS: There is diffuse soft tissue swelling about the great toe. This finding is associated with osteolysis involving the base of the distal phalanx of the great toe and deformity of the IP joint. No definite fracture or dislocation. No radiopaque foreign body.  Remaining joint spaces appear preserved.  IMPRESSION: Findings worrisome for septic arthritis involving the IP joint of the great toe with osteomyelitis involving at least the base of the distal phalanx of the great toe. Correlation with MRI performed 09/15/2014 is recommended.   Electronically Signed   By: Simonne Come M.D.   On: 09/16/2014 01:13    Microbiology: Recent Results (from the past  240 hour(s))  WOUND CULTURE     Status: None   Collection Time    09/16/14 12:17 AM      Result Value Ref Range Status   Specimen Description WOUND LEFT FOOT   Final   Special Requests NONE   Final   Gram Stain     Final   Value: NO WBC SEEN     NO SQUAMOUS EPITHELIAL CELLS SEEN     RARE GRAM POSITIVE COCCI     IN PAIRS     Performed at [facility]   Culture     Final   Value: MODERATE STAPHYLOCOCCUS AUREUS     Note: RIFAMPIN AND GENTAMICIN SHOULD NOT BE USED AS SINGLE DRUGS FOR TREATMENT OF STAPH INFECTIONS.     Performed at [facility]   Report Status 09/18/2014 FINAL   Final   Organism ID, Bacteria STAPHYLOCOCCUS AUREUS   Final  SURGICAL PCR SCREEN     Status: Abnormal   Collection Time    09/17/14  7:41 AM      Result Value Ref Range Status   MRSA, PCR NEGATIVE  NEGATIVE Final   Staphylococcus aureus POSITIVE (*) NEGATIVE Final   Comment:            The Xpert SA Assay (FDA     approved for NASAL specimens     in patients over 37 years of age),     is one component of     a comprehensive surveillance     program.  Test performance has     been validated by [employer] for patients greater     than or equal to 13 year old.     It is not intended     to diagnose infection nor to     guide or  monitor treatment.     RESULT CALLED TO, READ BACK BY AND VERIFIED WITH:     JENIFFER AT 1106 ON 09/17/14 BY S.VANHOORNE  WOUND CULTURE     Status: None   Collection Time    09/17/14 10:27 AM      Result Value Ref Range Status   Specimen Description TOE LEFT BIG   Final   Special Requests NONE   Final   Gram Stain     Final   Value: NO WBC SEEN     NO SQUAMOUS EPITHELIAL CELLS SEEN     MODERATE GRAM POSITIVE COCCI     IN PAIRS IN CLUSTERS     Performed at [facility]   Culture     Final   Value: MODERATE STAPHYLOCOCCUS AUREUS     Note: RIFAMPIN AND GENTAMICIN SHOULD NOT BE USED AS SINGLE DRUGS FOR TREATMENT OF STAPH INFECTIONS.     Performed at [facility]   Report Status 09/20/2014 FINAL   Final   Organism ID, Bacteria STAPHYLOCOCCUS AUREUS   Final  ANAEROBIC CULTURE     Status: None   Collection Time    09/17/14 10:27 AM      Result Value Ref Range Status   Specimen Description WOUND LEFT GREAT TOE   Final   Special Requests NONE   Final   Gram Stain     Final   Value: NO WBC SEEN     NO SQUAMOUS EPITHELIAL CELLS SEEN     MODERATE GRAM POSITIVE COCCI     IN PAIRS IN CLUSTERS     Performed at [facility]  Culture     Final   Value: NO ANAEROBES ISOLATED     Performed at [facility]   Report Status 09/22/2014 FINAL   Final  ANAEROBIC CULTURE     Status: None   Collection Time    09/20/14  8:[facility]      Result Value Ref Range Status   Specimen Description WOUND LEFT GREAT TOE   Final   Special Requests VANCOMYCIN 750mg  AT 0600   Final   Gram Stain     Final   Value: FEW WBC PRESENT,BOTH PMN AND MONONUCLEAR     NO SQUAMOUS EPITHELIAL CELLS SEEN     RARE GRAM POSITIVE COCCI     IN PAIRS     [facility]   Culture     Final   Value: NO ANAEROBES ISOLATED; CULTURE IN PROGRESS FOR 5 DAYS     [facility]formed at [facility]   Report Status PENDING   Incomplete  WOUND CULTURE     Status: None   Collection Time     09/20/14  8:05 AM      Result Value Ref Range Status   Specimen Description WOUND LEFT GREAT TOE   Final   Special Requests VANCOMYCIN 750mg  AT 0600   Final   Gram Stain     Final   Value: FEW WBC PRESENT, PREDOMINANTLY PMN     NO SQUAMOUS EPITHELIAL CELLS SEEN     FEW GRAM POSITIVE COCCI     IN PAIRS     [facility]formed at [facility]   Culture     Final   Value: MODERATE STAPHYLOCOCCUS AUREUS     Note: RIFAMPIN AND GENTAMICIN SHOULD NOT BE USED AS SINGLE DRUGS FOR TREATMENT OF STAPH INFECTIONS.     Performed at [facility]   Report Status 09/22/2014 FINAL   Final   Organism ID, Bacteria STAPHYLOCOCCUS AUREUS   Final     Labs: Basic Metabolic Panel:  Recent Labs Lab 09/17/14 0517 09/18/14 0552 09/19/14 0536 09/20/14 0527 09/22/14 0513  NA 142 143 143 145 141  K 3.7 3.4* 3.4* 4.0 4.2  CL 107 108 107 106 102  CO2 25 25 27 29 29   GLUCOSE 105* 93 99 91 86  BUN 16 14 15 11 7   CREATININE 0.93 0.87 0.87 0.86 0.91  CALCIUM 8.7 8.5 9.0 9.1 9.5  MG  --   --   --  2.1  --    Liver Function Tests:  Recent Labs Lab 09/15/14 2345 09/17/14 0517  AST 14 13  ALT 13 11  ALKPHOS 107 104  BILITOT 1.2 0.7  PROT 7.2 6.3  ALBUMIN 3.4* 2.6*   No results found for this basename: LIPASE, AMYLASE,  in the last 168 hours No results found for this basename: AMMONIA,  in the last 168 hours CBC:  Recent Labs Lab 09/15/14 2345 09/17/14 0517 09/18/14 0552 09/19/14 0536 09/21/14 0809 09/22/14 0513  WBC 20.5* 12.4* 14.0* 9.2 8.4 10.7*  NEUTROABS 18.0*  --   --   --   --   --   HGB 11.3* 9.9* 9.4* 9.0* 10.0* 10.6*  HCT 33.3* 29.8* 28.4* 27.2* 30.1* 31.8*  MCV 90.0 91.1 90.7 90.4 89.6 89.6  PLT 237 217 266 262 328 396   Cardiac Enzymes: No results found for this basename: CKTOTAL, CKMB, CKMBINDEX, TROPONINI,  in the last 168 hours BNP: BNP (last 3 results) No results found for this basename: PROBNP,  in the last 8760 hours CBG:  Recent Labs Lab 09/16/14 0110   GLUCAP 123*       Signed:  Gwenyth Bender  [facility] 09/22/2014, 11:58 AM

## 2014-09-22 NOTE — Clinical Social Work Placement (Signed)
Clinical Social Work Department CLINICAL SOCIAL WORK PLACEMENT NOTE 09/22/2014  Patient:  Cassandra [name],Naw R  Account Number:  [id number] Admit date:  09/15/2014  Clinical Social Worker:  Derenda FennelKARA Ki Luckman, LCSW  Date/time:  09/22/2014 10:25 AM  Clinical Social Work is seeking post-discharge placement for this patient at the following level of care:   SKILLED NURSING   (*CSW will update this form in Epic as items are completed)   09/22/2014  [facility]family provided with [facility] of Clinical [facility] Work's list of facilities offering this level of care within the geographic area requested by the patient (or if unable, by the patient's family).  09/22/2014  Patient/family informed of their freedom to choose among providers that offer the needed level of care, that participate in Medicare, Medicaid or managed care program needed by the patient, have an available bed and are willing to accept the patient.  09/22/2014  Patient/family informed of [facility]' ownership interest in [facility], as well as of the fact that they are under no obligation to receive care at this facility.  PASARR submitted to EDS on 09/22/2014 PASARR number received on 09/22/2014  FL2 transmitted to all facilities in geographic area requested by pt/family on  09/22/2014 FL2 transmitted to all facilities within larger geographic area on   Patient informed that his/her managed care company has contracts with or will negotiate with  certain facilities, including the following:     Patient/family informed of bed offers received:   Patient chooses bed at  Physician recommends and patient chooses bed at    Patient to be transferred to  on   Patient to be transferred to facility by  Patient and family notified of transfer on  Name of family member notified:    The following physician request were entered in Epic:   Additional Comments:  Derenda FennelKara Ibrahima Holberg, LCSW [phone]

## 2014-09-22 NOTE — Discharge Summary (Signed)
Patient seen and examined. Agree with note by NP Toya SmothersKaren Black. Patient is status post amputation of left great toe cultures growing MSSA. Will be discharged on Augmentin for 10 days as per podiatry recommendations. Discharge to skilled nursing facility today.  Peggye PittEstela Hernandez, MD [facility] Pager: [phone]

## 2014-09-22 NOTE — Progress Notes (Signed)
Subjective: Cassandra [name] is postoperative day 2 status post amputation of the left great toe.  She denies any nausea, vomiting, fever or chills.  Her pain is controlled.    Past Medical History  Diagnosis Date  . Hypertension   . Arthritis   . Depression   . Hyperlipidemia   . PFO (patent foramen ovale)     small  . H/O echocardiogram 2008    prolapse of post. mitral leaflet, tr MR, tr TR, EF >55%, mild concentric LVH  . H/O cardiovascular stress test 10/13/2007    low risk scan, no changes from previous test  . Heart palpitations     controlled on BB  . Hiatal hernia   . Cellulitis and abscess of toe of left foot 09/16/2014   Scheduled Meds: . atenolol  100 mg Oral Daily  . docusate sodium  100 mg Oral BID  . famotidine  20 mg Oral BID  . latanoprost  1 drop Both Eyes QHS  . piperacillin-tazobactam (ZOSYN)  IV  3.375 g Intravenous Q8H  . sertraline  100 mg Oral Daily  . simvastatin  10 mg Oral QHS  . vancomycin  750 mg Intravenous Q12H   Continuous Infusions:  PRN Meds:.acetaminophen, acetaminophen, albuterol, morphine injection, ondansetron (ZOFRAN) IV, ondansetron, polyvinyl alcohol, traMADol  Allergies  Allergen Reactions  . Codeine Shortness Of Breath and Nausea And Vomiting  . Hydrocodone Shortness Of Breath and Nausea And Vomiting   Past Surgical History  Procedure Laterality Date  . Cholecystectomy    . Cardiac catheterization  10/2002    normal coronary arteries, normal LV function  . Incision and drainage Right 07/21/2014    Procedure: INCISION AND DRAINAGE;  Surgeon: Dallas Schimke, DPM;  Location: [facility];  Service: Podiatry;  Laterality: Right;  . Incision and drainage Left 09/17/2014    Procedure: INCISION AND DRAINAGE 1ST TOE LEFT FOOT;  Surgeon: Dallas Schimke, DPM;  Location: [facility];  Service: Podiatry;  Laterality: Left;  . Bone biopsy Left 09/17/2014    Procedure: BONE BIOPSY;  Surgeon: Dallas Schimke, DPM;  Location: [facility];   Service: Podiatry;  Laterality: Left;  . Amputation Left 09/20/2014    Procedure: AMPUTATION DIGIT (LEFT GREAT TOE);  Surgeon: Dallas Schimke, DPM;  Location: [facility];  Service: Podiatry;  Laterality: Left;   Family History  Problem Relation Age of Onset  . Heart disease Mother   . Cancer Father   . Kidney disease Brother   . Hypertension Daughter   . Hyperlipidemia Son    Social History:  reports that she has never smoked. She has never used smokeless tobacco. She reports that she does not drink alcohol or use illicit drugs.  Physical Examination: Vital signs in last 24 hours:   Temp:  [97.5 F (36.4 C)-98.8 F (37.1 C)] 97.5 F (36.4 C) (10/21 1313) Pulse Rate:  [57-59] 59 (10/21 1313) Resp:  [18-20] 20 (10/21 1313) BP: (149-151)/(52-71) 149/67 mmHg (10/21 1313) SpO2:  [93 %-97 %] 97 % (10/21 1313)  Right foot:  A dressing is clean, dry and intact.  There is a full-thickness wound present along the medial aspect of the right first metatarsophalangeal joint.  The wound bed is granular.  There are no acute signs of infection present.  There is a full-thickness ulceration present along the distal aspect of the great toe.  The ulceration is granular.  There are no acute signs of infection present.  Dorsalis pedis pulse is palpable.  Posterior tibial  pulse is palpable. The calf is soft and nontender.  Left foot:  A surgical dressing is clean, dry and intact.  Mild strikethrough is present along the distal aspect of the dressing overlying the amputation site.  There is some erythema of the dorsum of the left foot.  This continues to improve  The left hallux is surgically absent.  The dorsal and plantar aspects of the incision are well approximated with sutures in place.  The central aspect of the incision remains open and packed with iodoform gauze.  No purulence was encountered.  The head of the first metatarsal is visible.  Dorsalis pedis pulse is palpable.  Posterior tibial pulses  palpable.  Lab/Test Results:    Recent Labs  09/20/14 0527 09/21/14 0809 09/22/14 0513  WBC  --  8.4 10.7*  HGB  --  10.0* 10.6*  HCT  --  30.1* 31.8*  PLT  --  328 396  NA 145  --  141  K 4.0  --  4.2  CL 106  --  102  CO2 29  --  29  BUN 11  --  7  CREATININE 0.86  --  0.91  GLUCOSE 91  --  86  CALCIUM 9.1  --  9.5    Recent Results (from the past 240 hour(s))  WOUND CULTURE     Status: None   Collection Time    09/16/14 12:17 AM      Result Value Ref Range Status   Specimen Description WOUND LEFT FOOT   Final   Special Requests NONE   Final   Gram Stain     Final   Value: NO WBC SEEN     NO SQUAMOUS EPITHELIAL CELLS SEEN     RARE GRAM POSITIVE COCCI     IN PAIRS     Performed at [facility]   Culture     Final   Value: MODERATE STAPHYLOCOCCUS AUREUS     Note: RIFAMPIN AND GENTAMICIN SHOULD NOT BE USED AS SINGLE DRUGS FOR TREATMENT OF STAPH INFECTIONS.     Performed at [facility]   Report Status 09/18/2014 FINAL   Final   Organism ID, Bacteria STAPHYLOCOCCUS AUREUS   Final  SURGICAL PCR SCREEN     Status: Abnormal   Collection Time    09/17/14  7:41 AM      Result Value Ref Range Status   MRSA, PCR NEGATIVE  NEGATIVE Final   Staphylococcus aureus POSITIVE (*) NEGATIVE Final   Comment:            The Xpert SA Assay (FDA     approved for NASAL specimens     in patients over 78 years of age),     is one component of     a comprehensive surveillance     program.  Test performance has     been validated by [facility] for patients greater     than or equal to 78 year old.     It is not intended     to diagnose infection nor to     guide or monitor treatment.     RESULT CALLED TO, READ BACK BY AND VERIFIED WITH:     JENIFFER AT 1106 ON 09/17/14 BY S.VANHOORNE  WOUND CULTURE     Status: None   Collection Time    09/17/14 10:27 AM      Result Value Ref Range Status   Specimen Description  TOE LEFT BIG   Final   Special Requests NONE    Final   Gram Stain     Final   Value: NO WBC SEEN     NO SQUAMOUS EPITHELIAL CELLS SEEN     MODERATE GRAM POSITIVE COCCI     IN PAIRS IN CLUSTERS     Performed at [facility]   Culture     Final   Value: MODERATE STAPHYLOCOCCUS AUREUS     Note: RIFAMPIN AND GENTAMICIN SHOULD NOT BE USED AS SINGLE DRUGS FOR TREATMENT OF STAPH INFECTIONS.     Performed at [facility]   Report Status 09/20/2014 FINAL   Final   Organism ID, Bacteria STAPHYLOCOCCUS AUREUS   Final  ANAEROBIC CULTURE     Status: None   Collection Time    09/17/14 10:27 AM      Result Value Ref Range Status   Specimen Description WOUND LEFT GREAT TOE   Final   Special Requests NONE   Final   Gram Stain     Final   Value: NO WBC SEEN     NO SQUAMOUS EPITHELIAL CELLS SEEN     MODERATE GRAM POSITIVE COCCI     IN PAIRS IN CLUSTERS     Performed at [facility]   Culture     Final   Value: NO ANAEROBES ISOLATED     Performed at [facility]   Report Status 09/22/2014 FINAL   Final  ANAEROBIC CULTURE     Status: None   Collection Time    09/20/14  8:05 AM      Result Value Ref Range Status   Specimen Description WOUND LEFT GREAT TOE   Final   Special Requests VANCOMYCIN 750mg  AT 0600   Final   Gram Stain     Final   Value: FEW WBC PRESENT,BOTH PMN AND MONONUCLEAR     NO SQUAMOUS EPITHELIAL CELLS SEEN     RARE GRAM POSITIVE COCCI     IN PAIRS     Performed at [facility]   Culture     Final   Value: NO ANAEROBES ISOLATED; CULTURE IN PROGRESS FOR 5 DAYS     Performed at [facility]   Report Status PENDING   Incomplete  WOUND CULTURE     Status: None   Collection Time    09/20/14  8:05 AM      Result Value Ref Range Status   Specimen Description WOUND LEFT GREAT TOE   Final   Special Requests VANCOMYCIN 750mg  AT 0600   Final   Gram Stain     Final   Value: FEW WBC PRESENT, PREDOMINANTLY PMN     NO SQUAMOUS EPITHELIAL CELLS SEEN     FEW GRAM POSITIVE COCCI      IN PAIRS     Performed at [facility]   Culture     Final   Value: MODERATE STAPHYLOCOCCUS AUREUS     Note: RIFAMPIN AND GENTAMICIN SHOULD NOT BE USED AS SINGLE DRUGS FOR TREATMENT OF STAPH INFECTIONS.     Performed at [facility]   Report Status 09/22/2014 FINAL   Final   Organism ID, Bacteria STAPHYLOCOCCUS AUREUS   Final     No results found.  Assessment: 1.  Postoperative day one status post amputation of the left great toe for treatment of septic joint of the interphalangeal joint and osteomyelitis. 2.  Stable ulceration of the right hallux.  Plan: A Betadine dressing was applied to the right foot.  The amputation site of the left hallux was packed with packing gauze and covered with a dry, sterile dressing.  She is okay to transition to the rehab facility from my standpoint.  I recommend a 10 day course of Augmentin 500 mg BID.  Her dressings will need to be changed daily.  The surgical wound of the left foot will need to be packed with plain packing gauze.  A Betadine dressing should be applied to the right foot.  She is to remain NWB on the left foot.     Cassandra [name] IVAN 09/22/2014, 1:56 PM

## 2014-09-22 NOTE — Clinical Social Work Psychosocial (Signed)
Clinical Social Work Department BRIEF PSYCHOSOCIAL ASSESSMENT 09/22/2014  Patient:  Cassandra [name], Cassandra [name]     Account Number:  [id number]     Admit date:  09/15/2014  Clinical Social Worker:  Wyatt Haste  Date/Time:  09/22/2014 10:26 AM  Referred by:  CSW  Date Referred:  09/22/2014 Referred for  SNF Placement   Other Referral:   Interview type:  Patient Other interview type:    PSYCHOSOCIAL DATA Living Status:  FAMILY Admitted from facility:   Level of care:   Primary support name:  Nancy/Rebecca Primary support relationship to patient:  CHILD, ADULT Degree of support available:   supportive per pt    CURRENT CONCERNS Current Concerns  Post-Acute Placement   Other Concerns:    SOCIAL WORK ASSESSMENT / PLAN CSW met with pt at bedside. Pt alert and oriented and reports she lives with her husband. She has three children who also live nearby. Family is involved and supportive. Pt is s/p amputation of left great toe. Pt expresses that this is going to be a difficult adjustment for her. She states she knew recommendation for SNF would be coming as well. CSW provided brief support. PT evaluated pt this morning and does feel pt will need rehab at d/c. Prior to admission, pt was independent with ADLs and still driving. She is aware of [employer] criteria for authorization. Pt states she is only interested in [facility] at this point. CSW agreed to send out referral and will follow up later today.   Assessment/plan status:  Psychosocial Support/Ongoing Assessment of Needs Other assessment/ plan:   Information/referral to community resources:   SNF list    PATIENT'S/FAMILY'S RESPONSE TO PLAN OF CARE: Pt agreeable to short term rehab prior to return home. She admits that she is feeling weak and would benefit from therapy.       Benay Pike, [place]

## 2014-09-22 NOTE — Evaluation (Signed)
Physical Therapy Evaluation Patient Details Name: Cassandra [name] MRN: [id number] DOB: 07/21/1933 Today's Date: 09/22/2014   History of Present Illness  Cassandra [name] is a 80 y.o. female with a past medical history of hypertension, hypercholesterolemia, who underwent incision and drainage of an abscess in the right foot in August. She went for followup with her podiatrist yesterday and was noted to have swelling of the left first toe. Patient is somewhat vague regarding the time of onset. She doesn't answer specifically. She tells me that all of a sudden yesterday her toe became worse. Dr. Nolen MuMcKinney, apparently obtain a x-ray in his office and referred her for MRI. And, then because her toe was swelling up she decided to come in to the hospital. She has some pain in that toe, which is worse with walking. She is unable to quantify the pain. It's a dull pain. She had fever with a temperature up to 99.7, with some chills. No radiation of the pain. Denies any other symptoms at this time. She is worried about losing that toe. History is somewhat limited.  Pt underwent amputation of Lt hallux (great toe) on 09/20/14 due to osteomyelitis of toe; MD orders for NWB Lt LE.  Clinical Impression  Pt is a 80 year old female who presents to PT s/p amputation of Lt great toe on 09/20/14; MD orders for NWB.  Pt was in the hospital ~2 month ago for I&D on Rt foot, which pt reports is still healing and still tender during gait.  Educated pt on NWB status and techniques for transfers and gait maintaining NWB.  During evaluation, pt required min/mod assist for bed mobility skills and mod assist to transfer to standing.  Pt required continuous VC to avoid WB on the Lt LT.  Attempted to amb, though pt unable to maintain NWB status or push through arms to advance Rt foot forward.  Attempted side stepping by sliding Rt foot, though pt unable to heel-toe slide to the side maintaining NWB.  Recommend continued PT in the hospital  to address strengthening, activity tolerance, and balance for improved functional mobility skills maintaining WB restrictions with transition to SNF at discharge.  No DME recommendations.     Follow Up Recommendations SNF    Equipment Recommendations  None recommended by PT       Precautions / Restrictions Precautions Precautions: Fall Restrictions Weight Bearing Restrictions: Yes LLE Weight Bearing: Non weight bearing      Mobility  Bed Mobility Overal bed mobility: Needs Assistance Bed Mobility: Supine to Sit;Sit to Supine     Supine to sit: Min assist;Mod assist (for trunk) Sit to supine: Supervision      Transfers Overall transfer level: Needs assistance Equipment used: Rolling walker (2 wheeled) Transfers: Sit to/from Stand Sit to Stand: Mod assist         General transfer comment: Placement of PT foot under Lt LE to avoid/ensure pt was not WB during transfer   Ambulation/Gait             General Gait Details: Pt unable to complete at this time; attempted gait NWB and side stepping by heel-toe sliding Rt foot sideway though pt unable to complete either.      Balance Overall balance assessment: Needs assistance Sitting-balance support: Feet supported;Single extremity supported (Rt LE supported) Sitting balance-Leahy Scale: Good     Standing balance support: Bilateral upper extremity supported;During functional activity Standing balance-Leahy Scale: Poor  Pertinent Vitals/Pain Pain Assessment: No/denies pain    Home Living Family/patient expects to be discharged to:: Skilled nursing facility Living Arrangements: Spouse/significant other Available Help at Discharge: Family;Available 24 hours/day Type of Home: House Home Access: Stairs to enter   [employer] Corporation[employer]trance Stairs-Number of Steps: 2 steps to front door, 5-6 steps to back door with Lt handrail Home Layout: Laundry or work area in basement ((B)  handrail) Home Equipment: Bedside commode Additional Comments: Tub shower    Prior Function Level of Independence: Independent         Comments: Pt reports (I) with bed mobility skills, transfers, and ambulatory skills in home and in community with use of RW as needed secondary to Rt foot pain from I&D 2 months ago.  Pt assists with care of her granddaughter with MS.          Extremity/Trunk Assessment               Lower Extremity Assessment: LLE deficits/detail   LLE Deficits / Details: Lt foot bandaged and wrapped in ACE wrap.       Communication   Communication: No difficulties  Cognition Arousal/Alertness: Awake/alert Behavior During Therapy: WFL for tasks assessed/performed Overall Cognitive Status: Within Functional Limits for tasks assessed                       Assessment/Plan    PT Assessment Patient needs continued PT services  PT Diagnosis Difficulty walking;Generalized weakness   PT Problem List Decreased strength;Decreased activity tolerance;Decreased balance;Decreased mobility  PT Treatment Interventions Balance training;Stair training;Functional mobility training;Therapeutic activities;Therapeutic exercise;Patient/family education   PT Goals (Current goals can be found in the Care Plan section) Acute Rehab PT Goals Patient Stated Goal: go home PT Goal Formulation: With patient Time For Goal Achievement: 10/06/14 Potential to Achieve Goals: Fair    Frequency Min 3X/week   Barriers to discharge Inaccessible home environment;Decreased caregiver support         End of Session Equipment Utilized During Treatment: Gait belt Activity Tolerance: Patient limited by fatigue Patient left: in bed;with call bell/phone within reach;with bed alarm set           Time: [id number] PT Time Calculation (min): 37 min   Charges:   PT Evaluation $Initial PT Evaluation Tier I: 1 Procedure PT Treatments $Self Care/Home Management: 8-22 (NWB  restrictions and techniques for transfers/gait, rehab expections and SNF recommendation)   [name] 09/22/2014, 9:56 AM

## 2014-09-22 NOTE — Clinical Social Work Note (Signed)
[facility] able to accept pt today [facility] Pt ready for d/c. Pt and daughter, [name] aware and agreeable. D/C summary faxed. Pt to transfer with RN.  Derenda FennelKara Denzell Colasanti, [place] [phone]

## 2014-09-22 NOTE — Telephone Encounter (Signed)
[facility] 

## 2014-09-22 NOTE — Clinical Social Work Placement (Signed)
Clinical Social Work Department CLINICAL SOCIAL WORK PLACEMENT NOTE 09/22/2014  Patient:  Judieth KeensDAVENPORT,Xochil R  Account Number:  [id number] Admit date:  09/15/2014  Clinical Social Worker:  Derenda FennelKARA Anselma Herbel, LCSW Date/time:  09/22/2014 10:25 AM  Clinical Social Work is seeking post-discharge placement for this patient at the following level of care:   SKILLED NURSING   (*CSW will update this form in Epic as items are completed)   09/22/2014  [facility]family provided with [facility] of Clinical [facility] Work's list of facilities offering this level of care within the geographic area requested by the patient (or if unable, by the patient's family).  09/22/2014  Patient/family informed of their freedom to choose among providers that offer the needed level of care, that participate in Medicare, Medicaid or managed care program needed by the patient, have an available bed and are willing to accept the patient.  09/22/2014  Patient/family informed of [facility]' ownership interest in [facility], as well as of the fact that they are under no obligation to receive care at this facility.  PASARR submitted to EDS on 09/22/2014 PASARR number received on 09/22/2014  FL2 transmitted to all facilities in geographic area requested by pt/family on  09/22/2014 FL2 transmitted to all facilities within larger geographic area on   Patient informed that his/her managed care company has contracts with or will negotiate with  certain facilities, including the following:     Patient/family informed of bed offers received:  09/22/2014 [facility] Physician recommends and patient [facility]  [facility] on  09/22/2014 Patient to be transferred to facility by RN Patient and family notified of transfer on 09/22/2014 Name of family member notified:  Harriett SineNancy- daughter  The following physician request were  entered in Epic:   Additional Comments:  Derenda FennelKara Fiorella Hanahan, LCSW [phone]

## 2014-09-23 NOTE — Progress Notes (Signed)
UR chart review completed.  

## 2014-09-25 LAB — ANAEROBIC CULTURE

## 2014-09-27 ENCOUNTER — Non-Acute Institutional Stay (SKILLED_NURSING_FACILITY): Payer: Medicare Other | Admitting: Internal Medicine

## 2014-09-27 DIAGNOSIS — G609 Hereditary and idiopathic neuropathy, unspecified: Secondary | ICD-10-CM

## 2014-09-27 DIAGNOSIS — A4901 Methicillin susceptible Staphylococcus aureus infection, unspecified site: Secondary | ICD-10-CM

## 2014-09-27 DIAGNOSIS — L089 Local infection of the skin and subcutaneous tissue, unspecified: Secondary | ICD-10-CM

## 2014-09-27 NOTE — Progress Notes (Signed)
Patient ID: Cassandra KeensBetty R Ellerby, female   DOB: 12/18/1932, 80 y.o.   MRN: [id number]  Facility; [facility] Chief complaint; admission to SNF post [facility] from 10/14 to 10/21  History; This is a patient who is not a diabetic who had an acute infection in her left great toe. She was also anemic admitted to hospital. An MRI of the foot showed extensive infection involving the first proximal interphalangeal joint. She had fever with temperatures up to 99.7 and a white count over 20,000. She went on to have an amputation of the left great toe. She was initially treated with thank and Zosyn for 6 days however culture results showed penicillin sensitive staph aureus. She was treated with Augmentin for 10 days. Her white count went from 20.5 down to 10.7 on 10/21.  The patient tells me that sometime in the summer she developed embedded gravel in her right great toe that required surgery. She describes some altered sensation in both of her feet although she is not a diabetic. Her hemoglobin A1c in the hospital was 5.6. She was noted to be anemic with an admission hemoglobin of 11.3, 10.6 at discharge. This appears to be normochromic normocytic. An anemia panel revealed a total iron of 22, TIBC of 160 ferritin of 231 and folate of 15.5 and vitamin B12 at 1469.  Past Medical History  Diagnosis Date  . Hypertension   . Arthritis   . Depression   . Hyperlipidemia   . PFO (patent foramen ovale)     small  . H/O echocardiogram 2008    prolapse of post. mitral leaflet, tr MR, tr TR, EF >55%, mild concentric LVH  . H/O cardiovascular stress test 10/13/2007    low risk scan, no changes from previous test  . Heart palpitations     controlled on BB  . Hiatal hernia   . Cellulitis and abscess of toe of left foot 09/16/2014   Past Surgical History  Procedure Laterality Date  . Cholecystectomy    . Cardiac catheterization  10/2002    normal coronary arteries, normal LV function  . Incision and  drainage Right 07/21/2014    Procedure: INCISION AND DRAINAGE;  Surgeon: Dallas SchimkeBenjamin Ivan McKinney, DPM;  Location: [facility];  Service: Podiatry;  Laterality: Right;  . Incision and drainage Left 09/17/2014    Procedure: INCISION AND DRAINAGE 1ST TOE LEFT FOOT;  Surgeon: Dallas SchimkeBenjamin Ivan McKinney, DPM;  Location: [facility];  Service: Podiatry;  Laterality: Left;  . Bone biopsy Left 09/17/2014    Procedure: BONE BIOPSY;  Surgeon: Dallas SchimkeBenjamin Ivan McKinney, DPM;  Location: [facility];  Service: Podiatry;  Laterality: Left;  . Amputation Left 09/20/2014    Procedure: AMPUTATION DIGIT (LEFT GREAT TOE);  Surgeon: Dallas SchimkeBenjamin Ivan McKinney, DPM;  Location: [facility];  Service: Podiatry;  Laterality: Left;   Current Outpatient Prescriptions on File Prior to Visit  Medication Sig Dispense Refill  . amoxicillin-clavulanate (AUGMENTIN) 500-125 MG per tablet Take 1 tablet (500 mg total) by mouth 2 (two) times daily.  20 tablet  0  . aspirin 81 MG chewable tablet Chew 81 mg by mouth daily.      [name]. atenolol (TENORMIN) 100 MG tablet Take 1 tablet by mouth daily.      . Biotin 5000 MCG CAPS Take 10,000 mcg by mouth daily.       . Calcium Carbonate-Vitamin D (CALCIUM + D) 600-200 MG-UNIT TABS Take 1 tablet by mouth daily.      . carboxymethylcellulose (REFRESH  PLUS) 0.5 % SOLN Place 1 drop into both eyes 3 (three) times daily as needed (Dry Eyes).      . Coenzyme Q10 (CO Q 10) 10 MG CAPS Take 10 mg by mouth daily.       . Cyanocobalamin (VITAMIN B-12 IJ) Inject as directed every 30 (thirty) days.      [name] latanoprost (XALATAN) 0.005 % ophthalmic solution Place 1 drop into both eyes at bedtime.      . naproxen (NAPROSYN) 500 MG tablet Take 500 mg by mouth daily. For pain      . ranitidine (ZANTAC) 150 MG tablet Take 150 mg by mouth 2 (two) times daily.      . sertraline (ZOLOFT) 100 MG tablet Take 100 mg by mouth daily.       . silver sulfADIAZINE (SILVADENE) 1 % cream Apply 1 application topically 2 (two) times daily.      [name] SIMBRINZA  1-0.2 % SUSP Place 1 drop into both eyes 3 (three) times daily.      . simvastatin (ZOCOR) 10 MG tablet Take 10 mg by mouth at bedtime.      . traMADol (ULTRAM) 50 MG tablet Take one or two tablets by mouth every 6 hours as needed for pain  240 tablet  5    Social; patient lives in [place] with her husband. Independent with ADLs and I ADLs  reports that she has never smoked. She has never used smokeless tobacco. She reports that she does not drink alcohol or use illicit drugs.  family history includes Cancer in her father; Heart disease in her mother; Hyperlipidemia in her son; Hypertension in her daughter; Kidney disease in her brother.  Review of systems Respiratory no shortness of breath Cardiac no chest pain GU; it appears that she has some urinary frequency Neurologic; complains of numbness or altered sensation in both of her feet  Physical examination Gen. patient appears to be in no distress. Alert and able to give her own history Respiratory; clear air entry bilaterally Cardiac heart sounds are normal no murmurs she appears to be euvolemic Abdomen no liver no spleen no tenderness GU; some degree of suprapubic fullness I would wonder about some degree of urinary retention Neurologic; reflexes are intact at the knee jerks. Left foot; dorsalis pedis and posterior tibial pulses are intact. She has had some dehisced since of the wound although the site actually looks quite healthy. There is swelling of the metatarsal phalangeal joint with some tenderness. There was some apparently some erythema when she arrived in the facility which was marked although this appears to be considerably better.  Impression/plan #1 left foot infection as described. Cultures of this area grew penicillin sensitive staph aureus which should be well covered by Augmentin. I'm going to increase the dose however and prolong the treatment. Whether the infection and actually spread out of the original toe is what  is at question.  #2 history sounds as though she has some form of peripheral neuropathy. She is not a diabetic. She probably should be worked up for this as an outpatient. #3 normochromic normocytic anemia. Anemia panel was done which showed really wasn't that remarkable. #4 hypertension. Atenolol atenolol was decreased to 50 mg from 100 secondary to low normal blood pressures. This will need to be followed # 5 hypokalemia this was treated in the hospital although I'm not exactly sure of the source      Show images for US Arterial Seg Multiple  Study Result    CLINICAL DATA:  Bilateral lower extremity rest pain and claudication   EXAM: NONINVASIVE PHYSIOLOGIC VASCULAR STUDY OF BILATERAL LOWER EXTREMITIES   TECHNIQUE: Evaluation of both lower extremities was performed at rest, including calculation of ankle-brachial indices, multiple segmental pressure evaluation, segmental Doppler and segmental pulse volume recording.   COMPARISON:  None.   FINDINGS: Right ABI:  1.18   Left ABI:  1.14   Right Lower Extremity: The femoral and [id number] 51muIrine NGS: Articular erosion of the base of the distal phalanx great toe all with abnormal soft tissue enhancement, abnormal fluid in the interphalangeal joint, and fluid extension from the joint especially medially and in the plantar region forming a cavity suspicious for abscess but possibly draining to the medial scan. Abnormal erosion and irregularity of the distal articular surface of the proximal phalanx with edema signal and enhancement in the proximal phalanx and surrounding soft tissues.   Low grade edema tracks along the plantar musculature of the foot. Lisfranc ligament intact and Lisfranc alignment normal. The remaining toes and bony structures appear intact.   IMPRESSION: 1. Suspected septic arthritis of the interphalangeal joint with extensive extracapsular surrounding fluid collection compatible with abscess tracking in the toe and possibly draining to the medial skin. Surrounding soft tissue enhancement and bony destructive findings along the interphalangeal joint. The bony findings are clearly worse compared to the prior CT scan of 07/18/2014. This is not a typical joint to be involved in SAPHO syndrome, and the patient's age makes chronic recurrent multifocal osteomyelitis (CRMO) unlikely. Gout arthropathy is considered unlikely given the degree of fluid collections. The joint should probably be drained.     Electronically Signed   By: Walt  Liebkemann M.D.   On: 09/16/2014 09:02    Results for Beeney, Jonel R (MRN [id number]) as of 09/27/2014 09:29  Ref. Range 09/20/2014 05:27 09/20/2014 08:05 09/20/2014 18:39 09/21/2014 08:09 09/22/2014 05:13  Sodium Latest Range: 137-147 mEq/L 145    141  Potassium Latest Range:  3.7-5.3 mEq/L 4.0    4.2  Chloride Latest Range: 96-112 mEq/L 106    102  CO2 Latest Range: 19-32 mEq/L 29    29  BUN Latest Range: 6-23 mg/dL 11    7  Creatinine Latest Range: 0.50-1.10 mg/dL 0.86    0.91  Calcium Latest Range: 8.4-10.5 mg/dL 9.1    9.5  GFR calc non Af Amer Latest Range: >90 mL/min 63 (L)    58 (L)  GFR calc Af Amer Latest Range: >90 mL/min  73 (L)    68 (L)  Glucose Latest Range: 70-99 mg/dL 91    86  Anion gap Latest Range: 5-15  10    10   Magnesium Latest Range: 1.5-2.5 mg/dL 2.1      WBC Latest Range: 4.0-10.5 K/uL    8.4 10.7 (H)  RBC Latest Range: 3.87-5.11 MIL/uL    3.36 (L) 3.55 (L)  Hemoglobin Latest Range: 12.0-15.0 g/dL    16.1 (L) 09.6 (L)  HCT Latest Range: 36.0-46.0 %    30.1 (L) 31.8 (L)  MCV Latest Range: 78.0-100.0 fL    89.6 89.6  MCH Latest Range: 26.0-34.0 pg    29.8 29.9  MCHC Latest Range: 30.0-36.0 g/dL    04.5 40.9  RDW Latest Range: 11.5-15.5 %    13.2 13.2  Platelets Latest Range: 150-400 K/uL    328 396  Color, Urine Latest Range: YELLOW    YELLOW    APPearance Latest Range: CLEAR    CLEAR    Specific Gravity, Urine Latest Range: 1.005-1.030    <1.005 (L)    pH Latest Range: 5.0-8.0    6.5    Glucose Latest Range: NEGATIVE mg/dL   NEGATIVE    Bilirubin Urine Latest Range: NEGATIVE    NEGATIVE    Ketones, ur Latest Range: NEGATIVE mg/dL   NEGATIVE    Protein Latest Range: NEGATIVE mg/dL   NEGATIVE    Urobilinogen, UA Latest Range: 0.0-1.0 mg/dL   0.2    Nitrite Latest Range: NEGATIVE    NEGATIVE    Leukocytes, UA Latest Range: NEGATIVE    NEGATIVE    Hgb urine dipstick Latest Range: NEGATIVE    NEGATIVE    Organism ID, Bacteria No range found  STAPHYLOCOCCUS AUREUS     ANAEROBIC CULTURE No range found  Rpt     WOUND CULTURE No range found  Rpt

## 2014-09-29 ENCOUNTER — Ambulatory Visit (HOSPITAL_COMMUNITY): Payer: Medicare Other | Attending: Podiatry

## 2014-09-29 NOTE — Progress Notes (Signed)
Subjective: [name] is status post amputation of the left great toe.  She denies any nausea, vomiting, fever or chills.  Her pain is controlled.    Past Medical History  Diagnosis Date  . Hypertension   . Arthritis   . Depression   . Hyperlipidemia   . PFO (patent foramen ovale)     small  . H/O echocardiogram 2008    prolapse of post. mitral leaflet, tr MR, tr TR, EF >55%, mild concentric LVH  . H/O cardiovascular stress test 10/13/2007    low risk scan, no changes from previous test  . Heart palpitations     controlled on BB  . Hiatal hernia   . Cellulitis and abscess of toe of left foot 09/16/2014   Scheduled Meds:  Continuous Infusions: PRN Meds:.    Allergies  Allergen Reactions  . Codeine Shortness Of Breath and Nausea And Vomiting  . Hydrocodone Shortness Of Breath and Nausea And Vomiting   Past Surgical History  Procedure Laterality Date  . Cholecystectomy    . Cardiac catheterization  10/2002    normal coronary arteries, normal LV function  . Incision and drainage Right 07/21/2014    Procedure: INCISION AND DRAINAGE;  Surgeon: Cassandra [name], DPM;  Location: [facility];  Service: Podiatry;  Laterality: Right;  . Incision and drainage Left 09/17/2014    Procedure: INCISION AND DRAINAGE 1ST TOE LEFT FOOT;  Surgeon: Cassandra [name], DPM;  Location: [facility];  Service: Podiatry;  Laterality: Left;  . Bone biopsy Left 09/17/2014    Procedure: BONE BIOPSY;  Surgeon: Cassandra [name], DPM;  Location: [facility];  Service: Podiatry;  Laterality: Left;  . Amputation Left 09/20/2014    Procedure: AMPUTATION DIGIT (LEFT GREAT TOE);  Surgeon: Cassandra [name], DPM;  Location: [facility];  Service: Podiatry;  Laterality: Left;   Family History  Problem Relation Age of Onset  . Heart disease Mother   . Cancer Father   . Kidney disease Brother   . Hypertension Daughter   . Hyperlipidemia Son    Social History:  reports that she has never smoked. She  has never used smokeless tobacco. She reports that she does not drink alcohol or use illicit drugs.  Physical Examination:  Right foot:  A dressing is clean, dry and intact.  There is a full-thickness wound present along the medial aspect of the right first metatarsophalangeal joint.  The wound bed is granular.  There are no acute signs of infection present.  There is a full-thickness ulceration present along the distal aspect of the great toe.  The ulceration is granular.  There are no acute signs of infection present.  Dorsalis pedis pulse is palpable.  Posterior tibial pulse is palpable. The calf is soft and nontender.  Left foot:  A dressing is clean, dry and intact.  No strikethrough is present.  There is some persistent erythema along the dorsum of the left foot.  The significantly improved from Friday.  The left hallux is surgically absent.  The dorsal and plantar aspects of the incision are well approximated with sutures in place.  The wound is granulating well.  The head of the first metatarsal is no longer visible.  Dorsalis pedis pulse is palpable.  Posterior tibial pulses palpable.  Lab/Test Results:   No results found for this basename: WBC, HGB, HCT, PLT, NA, K, CL, CO2, BUN, CREATININE, GLUCOSE, CALCIUM,  in the last 72 hours  Recent Results (from the past 240 hour(s))  ANAEROBIC CULTURE     Status: None   Collection Time    09/20/14  8:05 AM      Result Value Ref Range Status   Specimen Description WOUND LEFT GREAT TOE   Final   Special Requests VANCOMYCIN 750mg  AT 0600   Final   Gram Stain     Final   Value: FEW WBC PRESENT,BOTH PMN AND MONONUCLEAR     NO SQUAMOUS EPITHELIAL CELLS SEEN     RARE GRAM POSITIVE COCCI     IN PAIRS     Performed at [facility]   Culture     Final   Value: NO ANAEROBES ISOLATED     Performed at [facility]   Report Status 09/25/2014 FINAL   Final  WOUND CULTURE     Status: None   Collection Time    09/20/14  8:05 AM       Result Value Ref Range Status   Specimen Description WOUND LEFT GREAT TOE   Final   Special Requests VANCOMYCIN 750mg  AT 0600   Final   Gram Stain     Final   Value: FEW WBC PRESENT, PREDOMINANTLY PMN     NO SQUAMOUS EPITHELIAL CELLS SEEN     FEW GRAM POSITIVE COCCI     IN PAIRS     Performed at [facility]   Culture     Final   Value: MODERATE STAPHYLOCOCCUS AUREUS     Note: RIFAMPIN AND GENTAMICIN SHOULD NOT BE USED AS SINGLE DRUGS FOR TREATMENT OF STAPH INFECTIONS.     Performed at [facility]   Report Status 09/22/2014 FINAL   Final   Organism ID, Bacteria STAPHYLOCOCCUS AUREUS   Final     No results found.  Assessment: 1.  Status post amputation of the left great toe for treatment of septic joint of the interphalangeal joint and osteomyelitis. 2.  Stable ulceration of the right hallux.  Plan: A Betadine dressing was applied to the right foot.  The amputation site of the left hallux was covered with a dry, sterile dressing.  Continue Augmentin 500 mg BID.  Continue daily dressing changes.  She is to remain NWB on the left foot.  I will see her again on 10/01/2014 or sooner if problems arise.  Radiographs of the left foot ordered.  From my standpoint, if there is continued improvement then she could transition home as early as next week.     Cassandra [name] IVAN 09/29/2014, 7:39 AM

## 2014-09-29 NOTE — Progress Notes (Signed)
Subjective: [name] is status post amputation of the left great toe.  She denies any nausea, vomiting, fever or chills.  Her pain is controlled.    Past Medical History  Diagnosis Date  . Hypertension   . Arthritis   . Depression   . Hyperlipidemia   . PFO (patent foramen ovale)     small  . H/O echocardiogram 2008    prolapse of post. mitral leaflet, tr MR, tr TR, EF >55%, mild concentric LVH  . H/O cardiovascular stress test 10/13/2007    low risk scan, no changes from previous test  . Heart palpitations     controlled on BB  . Hiatal hernia   . Cellulitis and abscess of toe of left foot 09/16/2014   Scheduled Meds:  Continuous Infusions: PRN Meds:.    Allergies  Allergen Reactions  . Codeine Shortness Of Breath and Nausea And Vomiting  . Hydrocodone Shortness Of Breath and Nausea And Vomiting   Past Surgical History  Procedure Laterality Date  . Cholecystectomy    . Cardiac catheterization  10/2002    normal coronary arteries, normal LV function  . Incision and drainage Right 07/21/2014    Procedure: INCISION AND DRAINAGE;  Surgeon: Dallas SchimkeBenjamin [name] Cassandra [name], DPM;  Location: [facility];  Service: Podiatry;  Laterality: Right;  . Incision and drainage Left 09/17/2014    Procedure: INCISION AND DRAINAGE 1ST TOE LEFT FOOT;  Surgeon: Dallas SchimkeBenjamin [name] Cassandra [name], DPM;  Location: [facility];  Service: Podiatry;  Laterality: Left;  . Bone biopsy Left 09/17/2014    Procedure: BONE BIOPSY;  Surgeon: Dallas SchimkeBenjamin [name] Cassandra [name], DPM;  Location: [facility];  Service: Podiatry;  Laterality: Left;  . Amputation Left 09/20/2014    Procedure: AMPUTATION DIGIT (LEFT GREAT TOE);  Surgeon: Dallas SchimkeBenjamin [name] Cassandra [name], DPM;  Location: [facility];  Service: Podiatry;  Laterality: Left;   Family History  Problem Relation Age of Onset  . Heart disease Mother   . Cancer Father   . Kidney disease Brother   . Hypertension Daughter   . Hyperlipidemia Son    Social History:  reports that she has never smoked. She  has never used smokeless tobacco. She reports that she does not drink alcohol or use illicit drugs.  Physical Examination:  Right foot:  A dressing is clean, dry and intact.  There is a full-thickness wound present along the medial aspect of the right first metatarsophalangeal joint.  The wound bed is granular.  There are no acute signs of infection present.  There is a full-thickness ulceration present along the distal aspect of the great toe.  The ulceration is granular.  There are no acute signs of infection present.  Dorsalis pedis pulse is palpable.  Posterior tibial pulse is palpable. The calf is soft and nontender.  Left foot:  A dressing is clean, dry and intact.  No strikethrough is present.  There is persistent erythema along the dorsum of the left foot.  This is unchanged.  The left hallux is surgically absent.  The dorsal and plantar aspects of the incision are well approximated with sutures in place.  The central aspect of the incision remains open and packed with iodoform gauze.  No purulence was encountered.  The head of the first metatarsal is visible.  Dorsalis pedis pulse is palpable.  Posterior tibial pulses palpable.  Lab/Test Results:   No results found for this basename: WBC, HGB, HCT, PLT, NA, K, CL, CO2, BUN, CREATININE, GLUCOSE, CALCIUM,  in the last 72 hours  Recent Results (from the past 240 hour(s))  ANAEROBIC CULTURE     Status: None   Collection Time    09/20/14  8:05 AM      Result Value Ref Range Status   Specimen Description WOUND LEFT GREAT TOE   Final   Special Requests VANCOMYCIN 750mg  AT 0600   Final   Gram Stain     Final   Value: FEW WBC PRESENT,BOTH PMN AND MONONUCLEAR     NO SQUAMOUS EPITHELIAL CELLS SEEN     RARE GRAM POSITIVE COCCI     IN PAIRS     Performed at [facility]   Culture     Final   Value: NO ANAEROBES ISOLATED     Performed at [facility]   Report Status 09/25/2014 FINAL   Final  WOUND CULTURE     Status: None    Collection Time    09/20/14  8:05 AM      Result Value Ref Range Status   Specimen Description WOUND LEFT GREAT TOE   Final   Special Requests VANCOMYCIN 750mg  AT 0600   Final   Gram Stain     Final   Value: FEW WBC PRESENT, PREDOMINANTLY PMN     NO SQUAMOUS EPITHELIAL CELLS SEEN     FEW GRAM POSITIVE COCCI     IN PAIRS     Performed at [facility]   Culture     Final   Value: MODERATE STAPHYLOCOCCUS AUREUS     Note: RIFAMPIN AND GENTAMICIN SHOULD NOT BE USED AS SINGLE DRUGS FOR TREATMENT OF STAPH INFECTIONS.     Performed at [facility]   Report Status 09/22/2014 FINAL   Final   Organism ID, Bacteria STAPHYLOCOCCUS AUREUS   Final     No results found.  Assessment: 1.  Status post amputation of the left great toe for treatment of septic joint of the interphalangeal joint and osteomyelitis. 2.  Stable ulceration of the right hallux.  Plan: A Betadine dressing was applied to the right foot.  The amputation site of the left hallux was packed with packing gauze and covered with a dry, sterile dressing.  Continue Augmentin 500 mg BID.  Continue daily dressing changes.  She is to remain NWB on the left foot.  I will see her again on 09/28/2014 or sooner if problems arise.   Cassandra [name] 09/29/2014, 7:34 AM

## 2014-10-03 NOTE — Progress Notes (Signed)
Subjective: [name] is status post amputation of the left great toe.  She denies any nausea, vomiting, fever or chills.  Her pain is controlled.   Past Medical History  Diagnosis Date  . Hypertension   . Arthritis   . Depression   . Hyperlipidemia   . PFO (patent foramen ovale)     small  . H/O echocardiogram 2008    prolapse of post. mitral leaflet, tr MR, tr TR, EF >55%, mild concentric LVH  . H/O cardiovascular stress test 10/13/2007    low risk scan, no changes from previous test  . Heart palpitations     controlled on BB  . Hiatal hernia   . Cellulitis and abscess of toe of left foot 09/16/2014   Scheduled Meds:  Continuous Infusions: PRN Meds:.    Allergies  Allergen Reactions  . Codeine Shortness Of Breath and Nausea And Vomiting  . Hydrocodone Shortness Of Breath and Nausea And Vomiting   Past Surgical History  Procedure Laterality Date  . Cholecystectomy    . Cardiac catheterization  10/2002    normal coronary arteries, normal LV function  . Incision and drainage Right 07/21/2014    Procedure: INCISION AND DRAINAGE;  Surgeon: Dallas SchimkeBenjamin [name] Hansford Hirt, DPM;  Location: [facility];  Service: Podiatry;  Laterality: Right;  . Incision and drainage Left 09/17/2014    Procedure: INCISION AND DRAINAGE 1ST TOE LEFT FOOT;  Surgeon: Dallas SchimkeBenjamin [name] Reniah Cottingham, DPM;  Location: [facility];  Service: Podiatry;  Laterality: Left;  . Bone biopsy Left 09/17/2014    Procedure: BONE BIOPSY;  Surgeon: Dallas SchimkeBenjamin [name] Ceairra Mccarver, DPM;  Location: [facility];  Service: Podiatry;  Laterality: Left;  . Amputation Left 09/20/2014    Procedure: AMPUTATION DIGIT (LEFT GREAT TOE);  Surgeon: Dallas SchimkeBenjamin [name] Kairi Tufo, DPM;  Location: [facility];  Service: Podiatry;  Laterality: Left;   Family History  Problem Relation Age of Onset  . Heart disease Mother   . Cancer Father   . Kidney disease Brother   . Hypertension Daughter   . Hyperlipidemia Son    Social History:  reports that she has never smoked. She  has never used smokeless tobacco. She reports that she does not drink alcohol or use illicit drugs.  Physical Examination:  Right foot:  A dressing is clean, dry and intact.  There is a full-thickness wound present along the medial aspect of the right first metatarsophalangeal joint.  There are no acute signs of infection present.  There is a full-thickness ulceration present along the distal aspect of the great toe.  There are no acute signs of infection present.  Dorsalis pedis pulse is palpable.  Posterior tibial pulse is palpable. The calf is soft and nontender.  Left foot:  A dressing is clean, dry and intact.  No strikethrough is present.  The left hallux is surgically absent.  The dorsal and plantar aspects of the incision are well approximated with sutures in place.  The wound is granulating well.  The head of the first metatarsal is not visible.  Dorsalis pedis pulse is palpable.  Posterior tibial pulses palpable.  Assessment: 1.  Status post amputation of the left great toe for treatment of septic joint of the interphalangeal joint and osteomyelitis. 2.  Stable ulceration of the right hallux.  Plan: A Betadine dressing was applied to the right foot.  The amputation site of the left hallux was covered with a dry, sterile dressing.  Continue daily dressing changes.  She is to remain NWB on the  left foot.  I will see her again on 10/05/2014 or sooner if problems arise.  From my standpoint, she can transition home with daily wound care.  Cassandra [name]

## 2014-10-05 NOTE — Progress Notes (Signed)
Subjective: [name] is status post amputation of the left great toe.  She denies any nausea, vomiting, fever or chills.  Her pain is controlled. She went home for part of a day and did well.  Past Medical History  Diagnosis Date  . Hypertension   . Arthritis   . Depression   . Hyperlipidemia   . PFO (patent foramen ovale)     small  . H/O echocardiogram 2008    prolapse of post. mitral leaflet, tr MR, tr TR, EF >55%, mild concentric LVH  . H/O cardiovascular stress test 10/13/2007    low risk scan, no changes from previous test  . Heart palpitations     controlled on BB  . Hiatal hernia   . Cellulitis and abscess of toe of left foot 09/16/2014   Scheduled Meds:  Continuous Infusions: PRN Meds:.    Allergies  Allergen Reactions  . Codeine Shortness Of Breath and Nausea And Vomiting  . Hydrocodone Shortness Of Breath and Nausea And Vomiting   Past Surgical History  Procedure Laterality Date  . Cholecystectomy    . Cardiac catheterization  10/2002    normal coronary arteries, normal LV function  . Incision and drainage Right 07/21/2014    Procedure: INCISION AND DRAINAGE;  Surgeon: Dallas SchimkeBenjamin Ivan Asia Dusenbury, DPM;  Location: [facility];  Service: Podiatry;  Laterality: Right;  . Incision and drainage Left 09/17/2014    Procedure: INCISION AND DRAINAGE 1ST TOE LEFT FOOT;  Surgeon: Dallas SchimkeBenjamin Ivan Regenia Erck, DPM;  Location: [facility];  Service: Podiatry;  Laterality: Left;  . Bone biopsy Left 09/17/2014    Procedure: BONE BIOPSY;  Surgeon: Dallas SchimkeBenjamin Ivan Vishnu Moeller, DPM;  Location: [facility];  Service: Podiatry;  Laterality: Left;  . Amputation Left 09/20/2014    Procedure: AMPUTATION DIGIT (LEFT GREAT TOE);  Surgeon: Dallas SchimkeBenjamin Ivan Sloan Takagi, DPM;  Location: [facility];  Service: Podiatry;  Laterality: Left;   Family History  Problem Relation Age of Onset  . Heart disease Mother   . Cancer Father   . Kidney disease Brother   . Hypertension Daughter   . Hyperlipidemia Son    Social  History:  reports that she has never smoked. She has never used smokeless tobacco. She reports that she does not drink alcohol or use illicit drugs.  Physical Examination:  Right foot:  A dressing is clean, dry and intact.  There is a full-thickness wound present along the medial aspect of the right first metatarsophalangeal joint.  There are no acute signs of infection present.  There is a full-thickness ulceration present along the distal aspect of the great toe.  There are no acute signs of infection present.  Dorsalis pedis pulse is palpable.  Posterior tibial pulse is palpable. The calf is soft and nontender.  Left foot:  A dressing is clean, dry and intact.  No strikethrough is present.  The left hallux is surgically absent.  The dorsal and plantar aspects of the incision are well healed with sutures in place.  The wound is granulating well.  The head of the first metatarsal is not visible.  Dorsalis pedis pulse is palpable.  Posterior tibial pulses palpable.  Assessment: 1.  Status post amputation of the left great toe for treatment of septic joint of the interphalangeal joint and osteomyelitis. 2.  Stable ulceration of the right hallux.  Plan: A Betadine dressing was applied to the right foot.  The amputation site of the left hallux was covered with a dry, sterile dressing.  Continue daily  dressing changes.  She may transition home at anytime from my standpoint.  She will need home health for wound care.  I recommend advancing her to Aquacel to her left foot wound.  This may be changed QOD.  She is to advance to partial WB on the left foot.  She may put weight on her left heel while wearing her Darco shoe.  She is to follow-[place] at [place] on 10/12/2014.  Yani Coventry IVAN

## 2014-10-06 ENCOUNTER — Non-Acute Institutional Stay (SKILLED_NURSING_FACILITY): Payer: Medicare Other | Admitting: Internal Medicine

## 2014-10-06 DIAGNOSIS — L089 Local infection of the skin and subcutaneous tissue, unspecified: Secondary | ICD-10-CM

## 2014-10-06 DIAGNOSIS — A4901 Methicillin susceptible Staphylococcus aureus infection, unspecified site: Secondary | ICD-10-CM

## 2014-10-10 NOTE — Progress Notes (Signed)
Patient ID: Cassandra [name], female   DOB: 04/18/1933, 80 y.o.   MRN: [id number]               PROGRESS NOTE  DATE:  10/06/2014    FACILITY: [facility]    LEVEL OF CARE:   SNF   Acute Visit/Discharge Visit      CHIEF COMPLAINT:  Pre-discharge review.     HISTORY OF PRESENT ILLNESS:  This is a patient who is not a diabetic, although she may have an idiopathic peripheral neuropathy.  She initially presented to hospital with an acute infection in her left great toe.  An MRI showed extensive infection involving the first proximal interphalangeal joint.  She had a temperature and a white count of over 20,000.  She went on to have amputation of the left great toe.  She was treated with vanc and Zosyn.  Her culture ultimately showed pansensitive Staph aureus.  She was treated with Augmentin.  Her white count improved.    When she came into the building, there was some question as to whether there had been extension of the infection beyond the actual amputation.  We extended her Augmentin.  White count on 09/27/2014 had actually normalized.  The area on her foot is considerably better.  There is an open area on which they have been using apparently a wet-to-dry dressing.    She is due to go home.     PHYSICAL EXAMINATION:   MUSCULOSKELETAL:   EXTREMITIES:   LEFT LOWER EXTREMITY:  The foot itself looks considerably better.  There is still a surgical wound that is opened at the superior aspect of her original amputation incision.  There is no tenderness.  The erythema has retreated.  The base of the wound appears healthy and, according to the wound care nurse, there has been considerable improvement in that.    ASSESSMENT/PLAN:  Methicillin-sensitive Staph aureus infection of the foot.  Status post amputation of the first toe.  There is still a surgical wound here.  She will need nursing at home to help change this, also physical therapy.  They are using packing gauze on this.  I think  probably Santyl would be a better choice, or perhaps a collagen/hydrogel type dressing.  In any case, she will follow up with her surgeon.  Everything appears to be better.    CPT CODE: [id number]

## 2014-11-03 ENCOUNTER — Encounter: Payer: Self-pay | Admitting: Cardiovascular Disease

## 2014-11-03 ENCOUNTER — Ambulatory Visit (INDEPENDENT_AMBULATORY_CARE_PROVIDER_SITE_OTHER): Payer: Medicare Other | Admitting: Cardiovascular Disease

## 2014-11-03 VITALS — BP 132/82 | HR 61 | Ht 68.0 in | Wt 178.0 lb

## 2014-11-03 DIAGNOSIS — I10 Essential (primary) hypertension: Secondary | ICD-10-CM

## 2014-11-03 DIAGNOSIS — E785 Hyperlipidemia, unspecified: Secondary | ICD-10-CM

## 2014-11-03 NOTE — Progress Notes (Signed)
11/03/2014 Cassandra [name]   09/30/1933  [id number]  Primary Physician Cassandra [name], Cassandra CABOT, Cassandra [name] Primary Cardiologist: Cassandra GessJonathan J. Djon Tith Cassandra [name] Cassandra [name],Cassandra [name],Cassandra [name], Cassandra [name]   HPI:  The patient is a 80 year old, mildly overweight, married Caucasian female, mother of 3, grandmother to 4 grandchildren who I last saw a year ago. Her risk factors for heart disease are positive for hyperlipidemia as well as hypertension. She was cath'd November 2003 revealing normal coronary arteries and normal left ventricular function. She does have a history of a small PFO which she is asymptomatic from. Her last echo performed in 2008 was normal. She is otherwise asymptomatic. Cassandra [name] apparently follows her lab work including lipid profile in his office. She recently had her left great toe area. By Cassandra [name] refill because of infection and abscess.   Current Outpatient Prescriptions  Medication Sig Dispense Refill  . amoxicillin-clavulanate (AUGMENTIN) 500-125 MG per tablet Take 1 tablet (500 mg total) by mouth 2 (two) times daily. 20 tablet 0  . aspirin 81 MG chewable tablet Chew 81 mg by mouth daily.    [name]. atenolol (TENORMIN) 100 MG tablet Take 1 tablet by mouth daily.    . Biotin 5000 MCG CAPS Take 10,000 mcg by mouth daily.     . Calcium Carbonate-Vitamin D (CALCIUM + D) 600-200 MG-UNIT TABS Take 1 tablet by mouth daily.    . carboxymethylcellulose (REFRESH PLUS) 0.5 % SOLN Place 1 drop into both eyes 3 (three) times daily as needed (Dry Eyes).    . Coenzyme Q10 (CO Q 10) 10 MG CAPS Take 10 mg by mouth daily.     . Cyanocobalamin (VITAMIN B-12 IJ) Inject as directed every 30 (thirty) days.    [name]. latanoprost (XALATAN) 0.005 % ophthalmic solution Place 1 drop into both eyes at bedtime.    [name]. losartan (COZAAR) 100 MG tablet Take 100 mg by mouth daily.  11  . Multiple Vitamins-Minerals (PRESERVISION AREDS PO) Take 1 tablet by mouth 2 (two) times daily.    . naproxen (NAPROSYN) 500 MG tablet Take 500 mg by mouth  daily. For pain    . ranitidine (ZANTAC) 150 MG tablet Take 150 mg by mouth 2 (two) times daily.    . sertraline (ZOLOFT) 100 MG tablet Take 100 mg by mouth daily.     . silver sulfADIAZINE (SILVADENE) 1 % cream Apply 1 application topically 2 (two) times daily.    [name]. SIMBRINZA 1-0.2 % SUSP Place 1 drop into both eyes 3 (three) times daily.    . simvastatin (ZOCOR) 10 MG tablet Take 10 mg by mouth at bedtime.    . traMADol (ULTRAM) 50 MG tablet Take one or two tablets by mouth every 6 hours as needed for pain 240 tablet 5   No current facility-administered medications for this visit.    Allergies  Allergen Reactions  . Codeine Shortness Of Breath and Nausea And Vomiting  . Hydrocodone Shortness Of Breath and Nausea And Vomiting    History   Social History  . Marital Status: Married    Spouse Name: N/A    Number of Children: N/A  . Years of Education: N/A   Occupational History  . Not on file.   Social History Main Topics  . Smoking status: Never Smoker   . Smokeless tobacco: Never Used  . Alcohol Use: No  . Drug Use: No  . Sexual Activity: Not on file   Other Topics Concern  . Not on file   Social History  Narrative     Review of Systems: General: negative for chills, fever, night sweats or weight changes.  Cardiovascular: negative for chest pain, dyspnea on exertion, edema, orthopnea, palpitations, paroxysmal nocturnal dyspnea or shortness of breath Dermatological: negative for rash Respiratory: negative for cough or wheezing Urologic: negative for hematuria Abdominal: negative for nausea, vomiting, diarrhea, bright red blood per rectum, melena, or hematemesis Neurologic: negative for visual changes, syncope, or dizziness All other systems reviewed and are otherwise negative except as noted above.    Blood pressure 132/82, pulse 61, height 5\' 8"  (1.727 m), weight 178 lb (80.74 kg).  General appearance: alert and no distress Neck: no adenopathy, no carotid bruit,  no JVD, supple, symmetrical, trachea midline and thyroid not enlarged, symmetric, no tenderness/mass/nodules Lungs: clear to auscultation bilaterally Heart: regular rate and rhythm, S1, S2 normal, no murmur, click, rub or gallop Extremities: extremities normal, atraumatic, no cyanosis or edema  EKG normal sinus rhythm at 61 with nonspecific ST and T-wave changes. I personally reviewed this EKG  ASSESSMENT AND PLAN:   Hypertension History of hypertension with blood pressure today measured at 132/82. She is on losartan 100 mg a day and atenolol. Continue current medications at current dosing  Hyperlipidemia History of hyperlipidemia on simvastatin 10 mg a day. We will obtain her recent lab work from her primary care physician who follows this      Cassandra GessJonathan J. Nihal Doan Cassandra [name] [facility] CenterFACP,Cassandra [name],Cassandra [name], [facility]SCAI 11/03/2014 9:45 AM

## 2014-11-03 NOTE — Assessment & Plan Note (Signed)
History of hypertension with blood pressure today measured at 132/82. She is on losartan 100 mg a day and atenolol. Continue current medications at current dosing

## 2014-11-03 NOTE — Assessment & Plan Note (Signed)
History of hyperlipidemia on simvastatin 10 mg a day. We will obtain her recent lab work from her primary care physician who follows this

## 2014-11-03 NOTE — Patient Instructions (Signed)
Your physician wants you to follow-up in 1 year with Dr. Berry. You will receive a reminder letter in the mail 2 months in advance. If you do not receive a letter, please call our office to schedule the follow-up appointment.  

## 2014-11-04 ENCOUNTER — Encounter: Payer: Self-pay | Admitting: Cardiovascular Disease

## 2014-11-27 ENCOUNTER — Other Ambulatory Visit: Payer: Self-pay | Admitting: Internal Medicine

## 2014-12-25 ENCOUNTER — Other Ambulatory Visit: Payer: Self-pay | Admitting: Internal Medicine

## 2015-03-08 ENCOUNTER — Other Ambulatory Visit (HOSPITAL_COMMUNITY): Payer: Self-pay | Admitting: Family Medicine

## 2015-03-08 DIAGNOSIS — M8580 Other specified disorders of bone density and structure, unspecified site: Secondary | ICD-10-CM

## 2015-03-15 ENCOUNTER — Ambulatory Visit (HOSPITAL_COMMUNITY)
Admission: RE | Admit: 2015-03-15 | Discharge: 2015-03-15 | Disposition: A | Discharge status: Home / Self Care | Payer: Medicare Other | Source: Ambulatory Visit | Attending: Family Medicine | Admitting: Family Medicine

## 2015-03-15 DIAGNOSIS — M859 Disorder of bone density and structure, unspecified: Secondary | ICD-10-CM | POA: Insufficient documentation

## 2015-03-15 DIAGNOSIS — M8580 Other specified disorders of bone density and structure, unspecified site: Secondary | ICD-10-CM

## 2015-04-29 ENCOUNTER — Other Ambulatory Visit: Payer: Self-pay

## 2015-04-29 DIAGNOSIS — Z1231 Encounter for screening mammogram for malignant neoplasm of breast: Secondary | ICD-10-CM

## 2015-06-13 ENCOUNTER — Ambulatory Visit
Admission: RE | Admit: 2015-06-13 | Discharge: 2015-06-13 | Disposition: A | Discharge status: Home / Self Care | Payer: Medicare Other | Source: Ambulatory Visit

## 2015-06-13 DIAGNOSIS — Z1231 Encounter for screening mammogram for malignant neoplasm of breast: Secondary | ICD-10-CM

## 2015-11-04 ENCOUNTER — Ambulatory Visit: Payer: Medicare Other | Admitting: Cardiovascular Disease

## 2015-11-08 ENCOUNTER — Ambulatory Visit (INDEPENDENT_AMBULATORY_CARE_PROVIDER_SITE_OTHER): Payer: Medicare Other | Admitting: Cardiovascular Disease

## 2015-11-08 ENCOUNTER — Encounter: Payer: Self-pay | Admitting: Cardiovascular Disease

## 2015-11-08 VITALS — BP 132/76 | HR 62 | Ht 68.0 in | Wt 178.0 lb

## 2015-11-08 DIAGNOSIS — I10 Essential (primary) hypertension: Secondary | ICD-10-CM | POA: Diagnosis not present

## 2015-11-08 DIAGNOSIS — E785 Hyperlipidemia, unspecified: Secondary | ICD-10-CM

## 2015-11-08 NOTE — Patient Instructions (Signed)

## 2015-11-08 NOTE — Progress Notes (Signed)
11/08/2015 Cassandra [name]   02/07/1933  [id number]  Primary Physician Cassandra Ribas, MD Primary Cardiologist: Cassandra Gess MD Cassandra [name]   HPI:  The patient is a 81 year old, mildly overweight, married Caucasian female, mother of 3, grandmother to 4 grandchildren who I last saw a year ago. Her risk factors for heart disease are positive for hyperlipidemia as well as hypertension. She was cath'd November 2003 revealing normal coronary arteries and normal left ventricular function. She does have a history of a small PFO which she is asymptomatic from. Her last echo performed in 2008 was normal. She is otherwise asymptomatic. Cassandra [name] apparently follows her lab work including lipid profile in his office. She continues to have issues with a slowly healing ulcer on the bottom of her left foot which is followed by her podiatrist, Dr. Nolen [name] with "offloading". Dopplers performed a year ago showed no evidence of obstructive disease.   Current Outpatient Prescriptions  Medication Sig Dispense Refill  . amoxicillin-clavulanate (AUGMENTIN) 500-125 MG per tablet Take 1 tablet (500 mg total) by mouth 2 (two) times daily. 20 tablet 0  . aspirin 81 MG chewable tablet Chew 81 mg by mouth daily.    [name] atenolol (TENORMIN) 100 MG tablet Take 1 tablet by mouth daily.    . Biotin 5000 MCG CAPS Take 10,000 mcg by mouth daily.     . Calcium Carbonate-Vitamin D (CALCIUM + D) 600-200 MG-UNIT TABS Take 1 tablet by mouth daily.    . carboxymethylcellulose (REFRESH PLUS) 0.5 % SOLN Place 1 drop into both eyes 3 (three) times daily as needed (Dry Eyes).    . Coenzyme Q10 (CO Q 10) 10 MG CAPS Take 10 mg by mouth daily.     . Cyanocobalamin (VITAMIN B-12 IJ) Inject as directed every 30 (thirty) days.    [name] latanoprost (XALATAN) 0.005 % ophthalmic solution Place 1 drop into both eyes at bedtime.    [name] losartan (COZAAR) 100 MG tablet Take 100 mg by mouth daily.  11  . Multiple Vitamins-Minerals  (PRESERVISION AREDS PO) Take 1 tablet by mouth 2 (two) times daily.    . naproxen (NAPROSYN) 500 MG tablet Take 500 mg by mouth daily. For pain    . ranitidine (ZANTAC) 150 MG tablet Take 150 mg by mouth 2 (two) times daily.    . sertraline (ZOLOFT) 100 MG tablet Take 100 mg by mouth daily.     . silver sulfADIAZINE (SILVADENE) 1 % cream Apply 1 application topically 2 (two) times daily.    [name] SIMBRINZA 1-0.2 % SUSP Place 1 drop into both eyes 3 (three) times daily.    . simvastatin (ZOCOR) 10 MG tablet Take 10 mg by mouth at bedtime.    . traMADol (ULTRAM) 50 MG tablet Take one or two tablets by mouth every 6 hours as needed for pain 240 tablet 5   No current facility-administered medications for this visit.    Allergies  Allergen Reactions  . Codeine Shortness Of Breath and Nausea And Vomiting  . Hydrocodone Shortness Of Breath and Nausea And Vomiting    Social History   Social History  . Marital Status: Married    Spouse Name: N/A  . Number of Children: N/A  . Years of Education: N/A   Occupational History  . Not on file.   Social History Main Topics  . Smoking status: Never Smoker   . Smokeless tobacco: Never Used  . Alcohol Use: No  . Drug Use: No  .  Sexual Activity: Not on file   Other Topics Concern  . Not on file   Social History Narrative     Review of Systems: General: negative for chills, fever, night sweats or weight changes.  Cardiovascular: negative for chest pain, dyspnea on exertion, edema, orthopnea, palpitations, paroxysmal nocturnal dyspnea or shortness of breath Dermatological: negative for rash Respiratory: negative for cough or wheezing Urologic: negative for hematuria Abdominal: negative for nausea, vomiting, diarrhea, bright red blood per rectum, melena, or hematemesis Neurologic: negative for visual changes, syncope, or dizziness All other systems reviewed and are otherwise negative except as noted above.    Blood pressure 132/76, pulse  62, height 5\' 8"  (1.727 m), weight 178 lb (80.74 kg).  General appearance: alert and no distress Neck: no adenopathy, no carotid bruit, no JVD, supple, symmetrical, trachea midline and thyroid not enlarged, symmetric, no tenderness/mass/nodules Lungs: clear to auscultation bilaterally Heart: regular rate and rhythm, S1, S2 normal, no murmur, click, rub or gallop Extremities: extremities normal, atraumatic, no cyanosis or edema  EKG normal sinus rhythm at 62 with nonspecific ST and T-wave changes. I personally reviewed this EKG  ASSESSMENT AND PLAN:   Hypertension History of hypertension with blood pressure measured today at 132/76. She is on atenolol and losartan.. Continue current meds the courtesy  Hyperlipidemia History of hyperlipidemia on statin therapy followed by her PCP      Cassandra GessJonathan J. Milea Klink MD [facility] HospitalFACP,FACC,FAHA, [facility]trFSCAI 11/08/2015 10:33 AM

## 2015-11-08 NOTE — Assessment & Plan Note (Signed)
History of hypertension with blood pressure measured today at 132/76. She is on atenolol and losartan.. Continue current meds the courtesy

## 2015-11-08 NOTE — Assessment & Plan Note (Signed)
History of hyperlipidemia on statin therapy followed by her PCP. 

## 2015-12-23 ENCOUNTER — Other Ambulatory Visit (HOSPITAL_COMMUNITY): Payer: Self-pay | Admitting: Podiatry

## 2015-12-23 DIAGNOSIS — R0989 Other specified symptoms and signs involving the circulatory and respiratory systems: Secondary | ICD-10-CM

## 2015-12-29 ENCOUNTER — Inpatient Hospital Stay (HOSPITAL_COMMUNITY)
Admission: EM | Admit: 2015-12-29 | Discharge: 2016-01-13 | DRG: 853 | Disposition: A | Discharge status: Skilled Nursing Facility | Payer: Medicare Other | Attending: Internal Medicine | Admitting: Internal Medicine

## 2015-12-29 ENCOUNTER — Emergency Department (HOSPITAL_COMMUNITY): Payer: Medicare Other

## 2015-12-29 ENCOUNTER — Encounter (HOSPITAL_COMMUNITY): Payer: Self-pay | Admitting: *Deleted

## 2015-12-29 DIAGNOSIS — Z89439 Acquired absence of unspecified foot: Secondary | ICD-10-CM | POA: Insufficient documentation

## 2015-12-29 DIAGNOSIS — Z79899 Other long term (current) drug therapy: Secondary | ICD-10-CM

## 2015-12-29 DIAGNOSIS — R112 Nausea with vomiting, unspecified: Secondary | ICD-10-CM

## 2015-12-29 DIAGNOSIS — A4101 Sepsis due to Methicillin susceptible Staphylococcus aureus: Principal | ICD-10-CM | POA: Diagnosis present

## 2015-12-29 DIAGNOSIS — R339 Retention of urine, unspecified: Secondary | ICD-10-CM | POA: Diagnosis present

## 2015-12-29 DIAGNOSIS — Q211 Atrial septal defect: Secondary | ICD-10-CM | POA: Diagnosis not present

## 2015-12-29 DIAGNOSIS — I6349 Cerebral infarction due to embolism of other cerebral artery: Secondary | ICD-10-CM | POA: Diagnosis not present

## 2015-12-29 DIAGNOSIS — K529 Noninfective gastroenteritis and colitis, unspecified: Secondary | ICD-10-CM | POA: Diagnosis present

## 2015-12-29 DIAGNOSIS — Z7982 Long term (current) use of aspirin: Secondary | ICD-10-CM

## 2015-12-29 DIAGNOSIS — I330 Acute and subacute infective endocarditis: Secondary | ICD-10-CM | POA: Insufficient documentation

## 2015-12-29 DIAGNOSIS — R400 Somnolence: Secondary | ICD-10-CM | POA: Diagnosis not present

## 2015-12-29 DIAGNOSIS — I63419 Cerebral infarction due to embolism of unspecified middle cerebral artery: Secondary | ICD-10-CM | POA: Diagnosis not present

## 2015-12-29 DIAGNOSIS — R1110 Vomiting, unspecified: Secondary | ICD-10-CM | POA: Insufficient documentation

## 2015-12-29 DIAGNOSIS — A499 Bacterial infection, unspecified: Secondary | ICD-10-CM | POA: Diagnosis not present

## 2015-12-29 DIAGNOSIS — H409 Unspecified glaucoma: Secondary | ICD-10-CM | POA: Diagnosis not present

## 2015-12-29 DIAGNOSIS — N1330 Unspecified hydronephrosis: Secondary | ICD-10-CM | POA: Diagnosis present

## 2015-12-29 DIAGNOSIS — L97519 Non-pressure chronic ulcer of other part of right foot with unspecified severity: Secondary | ICD-10-CM | POA: Diagnosis present

## 2015-12-29 DIAGNOSIS — G9340 Encephalopathy, unspecified: Secondary | ICD-10-CM | POA: Diagnosis not present

## 2015-12-29 DIAGNOSIS — R0789 Other chest pain: Secondary | ICD-10-CM

## 2015-12-29 DIAGNOSIS — A419 Sepsis, unspecified organism: Secondary | ICD-10-CM

## 2015-12-29 DIAGNOSIS — R195 Other fecal abnormalities: Secondary | ICD-10-CM | POA: Insufficient documentation

## 2015-12-29 DIAGNOSIS — R7881 Bacteremia: Secondary | ICD-10-CM | POA: Diagnosis not present

## 2015-12-29 DIAGNOSIS — S91302A Unspecified open wound, left foot, initial encounter: Secondary | ICD-10-CM | POA: Insufficient documentation

## 2015-12-29 DIAGNOSIS — I10 Essential (primary) hypertension: Secondary | ICD-10-CM | POA: Diagnosis not present

## 2015-12-29 DIAGNOSIS — R109 Unspecified abdominal pain: Secondary | ICD-10-CM | POA: Diagnosis not present

## 2015-12-29 DIAGNOSIS — M86672 Other chronic osteomyelitis, left ankle and foot: Secondary | ICD-10-CM | POA: Diagnosis not present

## 2015-12-29 DIAGNOSIS — I76 Septic arterial embolism: Secondary | ICD-10-CM | POA: Diagnosis present

## 2015-12-29 DIAGNOSIS — I6340 Cerebral infarction due to embolism of unspecified cerebral artery: Secondary | ICD-10-CM | POA: Insufficient documentation

## 2015-12-29 DIAGNOSIS — R7989 Other specified abnormal findings of blood chemistry: Secondary | ICD-10-CM | POA: Diagnosis not present

## 2015-12-29 DIAGNOSIS — I6350 Cerebral infarction due to unspecified occlusion or stenosis of unspecified cerebral artery: Secondary | ICD-10-CM | POA: Diagnosis not present

## 2015-12-29 DIAGNOSIS — A084 Viral intestinal infection, unspecified: Secondary | ICD-10-CM | POA: Diagnosis present

## 2015-12-29 DIAGNOSIS — B9561 Methicillin susceptible Staphylococcus aureus infection as the cause of diseases classified elsewhere: Secondary | ICD-10-CM | POA: Diagnosis present

## 2015-12-29 DIAGNOSIS — G629 Polyneuropathy, unspecified: Secondary | ICD-10-CM | POA: Diagnosis present

## 2015-12-29 DIAGNOSIS — R0602 Shortness of breath: Secondary | ICD-10-CM | POA: Insufficient documentation

## 2015-12-29 DIAGNOSIS — R0989 Other specified symptoms and signs involving the circulatory and respiratory systems: Secondary | ICD-10-CM | POA: Insufficient documentation

## 2015-12-29 DIAGNOSIS — R10A0 Flank pain, unspecified side: Secondary | ICD-10-CM

## 2015-12-29 DIAGNOSIS — I639 Cerebral infarction, unspecified: Secondary | ICD-10-CM

## 2015-12-29 DIAGNOSIS — R4182 Altered mental status, unspecified: Secondary | ICD-10-CM | POA: Insufficient documentation

## 2015-12-29 DIAGNOSIS — E860 Dehydration: Secondary | ICD-10-CM | POA: Diagnosis present

## 2015-12-29 DIAGNOSIS — I339 Acute and subacute endocarditis, unspecified: Secondary | ICD-10-CM | POA: Diagnosis not present

## 2015-12-29 DIAGNOSIS — D62 Acute posthemorrhagic anemia: Secondary | ICD-10-CM | POA: Diagnosis not present

## 2015-12-29 DIAGNOSIS — M1990 Unspecified osteoarthritis, unspecified site: Secondary | ICD-10-CM | POA: Diagnosis present

## 2015-12-29 DIAGNOSIS — D735 Infarction of spleen: Secondary | ICD-10-CM | POA: Diagnosis present

## 2015-12-29 DIAGNOSIS — R748 Abnormal levels of other serum enzymes: Secondary | ICD-10-CM | POA: Diagnosis present

## 2015-12-29 DIAGNOSIS — E785 Hyperlipidemia, unspecified: Secondary | ICD-10-CM | POA: Diagnosis present

## 2015-12-29 DIAGNOSIS — L97529 Non-pressure chronic ulcer of other part of left foot with unspecified severity: Secondary | ICD-10-CM | POA: Diagnosis present

## 2015-12-29 DIAGNOSIS — R079 Chest pain, unspecified: Secondary | ICD-10-CM | POA: Insufficient documentation

## 2015-12-29 DIAGNOSIS — R10A1 Flank pain, right side: Secondary | ICD-10-CM

## 2015-12-29 DIAGNOSIS — I63139 Cerebral infarction due to embolism of unspecified carotid artery: Secondary | ICD-10-CM | POA: Diagnosis not present

## 2015-12-29 DIAGNOSIS — E876 Hypokalemia: Secondary | ICD-10-CM | POA: Diagnosis present

## 2015-12-29 DIAGNOSIS — I2690 Septic pulmonary embolism without acute cor pulmonale: Secondary | ICD-10-CM | POA: Diagnosis not present

## 2015-12-29 DIAGNOSIS — I209 Angina pectoris, unspecified: Secondary | ICD-10-CM | POA: Diagnosis not present

## 2015-12-29 DIAGNOSIS — L97511 Non-pressure chronic ulcer of other part of right foot limited to breakdown of skin: Secondary | ICD-10-CM | POA: Diagnosis not present

## 2015-12-29 DIAGNOSIS — I739 Peripheral vascular disease, unspecified: Secondary | ICD-10-CM | POA: Diagnosis present

## 2015-12-29 DIAGNOSIS — M25551 Pain in right hip: Secondary | ICD-10-CM | POA: Diagnosis present

## 2015-12-29 DIAGNOSIS — F329 Major depressive disorder, single episode, unspecified: Secondary | ICD-10-CM | POA: Diagnosis present

## 2015-12-29 DIAGNOSIS — I340 Nonrheumatic mitral (valve) insufficiency: Secondary | ICD-10-CM | POA: Diagnosis not present

## 2015-12-29 LAB — COMPREHENSIVE METABOLIC PANEL
ALBUMIN: 3.8 g/dL (ref 3.5–5.0)
ALT: 16 U/L (ref 14–54)
ANION GAP: 11 (ref 5–15)
AST: 24 U/L (ref 15–41)
Alkaline Phosphatase: 79 U/L (ref 38–126)
BILIRUBIN TOTAL: 1.5 mg/dL — AB (ref 0.3–1.2)
BUN: 21 mg/dL — AB (ref 6–20)
CALCIUM: 9.4 mg/dL (ref 8.9–10.3)
CO2: 25 mmol/L (ref 22–32)
CREATININE: 1.06 mg/dL — AB (ref 0.44–1.00)
Chloride: 101 mmol/L (ref 101–111)
GFR calc Af Amer: 56 mL/min — ABNORMAL LOW (ref >60)
GFR calc non Af Amer: 48 mL/min — ABNORMAL LOW (ref >60)
GLUCOSE: 141 mg/dL — AB (ref 65–99)
Potassium: 3.9 mmol/L (ref 3.5–5.1)
SODIUM: 137 mmol/L (ref 135–145)
Total Protein: 7.2 g/dL (ref 6.5–8.1)

## 2015-12-29 LAB — URINE MICROSCOPIC-ADD ON
Bacteria, UA: NONE SEEN
RBC / HPF: NONE SEEN RBC/hpf (ref 0–5)
WBC, UA: NONE SEEN WBC/hpf (ref 0–5)

## 2015-12-29 LAB — URINALYSIS, ROUTINE W REFLEX MICROSCOPIC
BILIRUBIN URINE: NEGATIVE
Glucose, UA: NEGATIVE mg/dL
HGB URINE DIPSTICK: NEGATIVE
KETONES UR: NEGATIVE mg/dL
Leukocytes, UA: NEGATIVE
Nitrite: NEGATIVE
Protein, ur: 30 mg/dL — AB
SPECIFIC GRAVITY, URINE: 1.02 (ref 1.005–1.030)
pH: 6.5 (ref 5.0–8.0)

## 2015-12-29 LAB — CBC
HCT: 39.5 % (ref 36.0–46.0)
HEMOGLOBIN: 13.2 g/dL (ref 12.0–15.0)
MCH: 30.6 pg (ref 26.0–34.0)
MCHC: 33.4 g/dL (ref 30.0–36.0)
MCV: 91.6 fL (ref 78.0–100.0)
Platelets: 191 10*3/uL (ref 150–400)
RBC: 4.31 MIL/uL (ref 3.87–5.11)
RDW: 12.8 % (ref 11.5–15.5)
WBC: 19.2 10*3/uL — ABNORMAL HIGH (ref 4.0–10.5)

## 2015-12-29 LAB — LIPASE, BLOOD: Lipase: 15 U/L (ref 11–51)

## 2015-12-29 MED ORDER — SODIUM CHLORIDE 0.9 % IV BOLUS (SEPSIS)
1000.0000 mL | Freq: Once | INTRAVENOUS | Status: AC
  Administered 2015-12-29: 1000 mL via INTRAVENOUS

## 2015-12-29 MED ORDER — CARBOXYMETHYLCELLULOSE SODIUM 0.5 % OP SOLN: 1.0000 [drp] | Freq: Three times a day (TID) | OPHTHALMIC | Status: DC | PRN

## 2015-12-29 MED ORDER — SODIUM CHLORIDE 0.9 % IV SOLN: INTRAVENOUS | Status: DC

## 2015-12-29 MED ORDER — ONDANSETRON HCL 4 MG/2ML IJ SOLN
4.0000 mg | Freq: Once | INTRAMUSCULAR | Status: AC
  Administered 2015-12-29: 4 mg via INTRAVENOUS
  Filled 2015-12-29: qty 2

## 2015-12-29 MED ORDER — ACETAMINOPHEN 325 MG PO TABS
650.0000 mg | ORAL_TABLET | Freq: Four times a day (QID) | ORAL | Status: DC | PRN
  Administered 2015-12-30 – 2016-01-12 (×12): 650 mg via ORAL
  Filled 2015-12-29 (×12): qty 2

## 2015-12-29 MED ORDER — SODIUM CHLORIDE 0.9 % IV SOLN: INTRAVENOUS | Status: AC

## 2015-12-29 MED ORDER — FAMOTIDINE 20 MG PO TABS
10.0000 mg | ORAL_TABLET | Freq: Two times a day (BID) | ORAL | Status: DC
  Administered 2015-12-30 – 2016-01-03 (×10): 10 mg via ORAL
  Administered 2016-01-03: 11:00:00 via ORAL
  Administered 2016-01-04: 10 mg via ORAL
  Filled 2015-12-29 (×12): qty 1

## 2015-12-29 MED ORDER — METOCLOPRAMIDE HCL 5 MG/ML IJ SOLN
INTRAMUSCULAR | Status: AC
  Administered 2015-12-29: 10 mg via INTRAVENOUS
  Filled 2015-12-29: qty 2

## 2015-12-29 MED ORDER — SODIUM CHLORIDE 0.9 % IV SOLN
INTRAVENOUS | Status: DC
  Administered 2015-12-29 – 2016-01-01 (×3): via INTRAVENOUS

## 2015-12-29 MED ORDER — MORPHINE SULFATE (PF) 4 MG/ML IV SOLN
4.0000 mg | INTRAVENOUS | Status: AC | PRN
  Administered 2015-12-29 (×2): 4 mg via INTRAVENOUS
  Filled 2015-12-29 (×2): qty 1

## 2015-12-29 MED ORDER — IOHEXOL 300 MG/ML  SOLN
100.0000 mL | Freq: Once | INTRAMUSCULAR | Status: AC | PRN
  Administered 2015-12-29: 100 mL via INTRAVENOUS

## 2015-12-29 MED ORDER — CALCIUM CARBONATE-VITAMIN D 500-200 MG-UNIT PO TABS
1.0000 | ORAL_TABLET | Freq: Every day | ORAL | Status: DC
  Administered 2015-12-30 – 2016-01-13 (×14): 1 via ORAL
  Filled 2015-12-29 (×15): qty 1

## 2015-12-29 MED ORDER — METOCLOPRAMIDE HCL 5 MG/ML IJ SOLN
10.0000 mg | Freq: Once | INTRAMUSCULAR | Status: AC
  Administered 2015-12-29: 10 mg via INTRAVENOUS

## 2015-12-29 MED ORDER — ONDANSETRON 4 MG PO TBDP: ORAL_TABLET | ORAL | Status: DC

## 2015-12-29 MED ORDER — BRINZOLAMIDE 1 % OP SUSP
1.0000 [drp] | Freq: Three times a day (TID) | OPHTHALMIC | Status: DC
  Administered 2015-12-30 – 2016-01-08 (×26): 1 [drp] via OPHTHALMIC
  Filled 2015-12-29 (×2): qty 10

## 2015-12-29 MED ORDER — ASPIRIN 81 MG PO CHEW
81.0000 mg | CHEWABLE_TABLET | Freq: Every day | ORAL | Status: DC
  Administered 2015-12-30 – 2016-01-04 (×6): 81 mg via ORAL
  Filled 2015-12-29 (×6): qty 1

## 2015-12-29 MED ORDER — SIMVASTATIN 10 MG PO TABS
10.0000 mg | ORAL_TABLET | Freq: Every day | ORAL | Status: DC
Start: 2015-12-29 — End: 2016-01-04
  Administered 2015-12-30 – 2016-01-03 (×6): 10 mg via ORAL
  Filled 2015-12-29 (×6): qty 1

## 2015-12-29 MED ORDER — HEPARIN SODIUM (PORCINE) 5000 UNIT/ML IJ SOLN
5000.0000 [IU] | Freq: Three times a day (TID) | INTRAMUSCULAR | Status: DC
  Administered 2015-12-30 – 2016-01-08 (×27): 5000 [IU] via SUBCUTANEOUS
  Filled 2015-12-29 (×28): qty 1

## 2015-12-29 MED ORDER — SILVER SULFADIAZINE 1 % EX CREA
1.0000 "application " | TOPICAL_CREAM | Freq: Two times a day (BID) | CUTANEOUS | Status: DC
  Administered 2015-12-30 (×2): 1 via TOPICAL
  Filled 2015-12-29: qty 85

## 2015-12-29 MED ORDER — DIATRIZOATE MEGLUMINE & SODIUM 66-10 % PO SOLN
ORAL | Status: AC
  Filled 2015-12-29: qty 30

## 2015-12-29 MED ORDER — SERTRALINE HCL 50 MG PO TABS
100.0000 mg | ORAL_TABLET | Freq: Every day | ORAL | Status: DC
  Administered 2015-12-30 – 2016-01-04 (×6): 100 mg via ORAL
  Filled 2015-12-29 (×6): qty 2

## 2015-12-29 MED ORDER — POLYVINYL ALCOHOL 1.4 % OP SOLN
1.0000 [drp] | Freq: Three times a day (TID) | OPHTHALMIC | Status: DC | PRN
  Filled 2015-12-29: qty 15

## 2015-12-29 MED ORDER — ONDANSETRON 4 MG PREPACK (~~LOC~~): 1.0000 | ORAL_TABLET | Freq: Three times a day (TID) | ORAL | Status: DC | PRN

## 2015-12-29 MED ORDER — FENTANYL CITRATE (PF) 100 MCG/2ML IJ SOLN
100.0000 ug | Freq: Once | INTRAMUSCULAR | Status: DC
  Filled 2015-12-29: qty 2

## 2015-12-29 MED ORDER — ONDANSETRON HCL 4 MG/5ML PO SOLN
4.0000 mg | Freq: Four times a day (QID) | ORAL | Status: DC | PRN
  Filled 2015-12-29: qty 5

## 2015-12-29 MED ORDER — ACETAMINOPHEN 650 MG RE SUPP: 650.0000 mg | Freq: Four times a day (QID) | RECTAL | Status: DC | PRN

## 2015-12-29 MED ORDER — BRIMONIDINE TARTRATE 0.2 % OP SOLN
1.0000 [drp] | Freq: Three times a day (TID) | OPHTHALMIC | Status: DC
  Administered 2015-12-30 – 2016-01-08 (×26): 1 [drp] via OPHTHALMIC
  Filled 2015-12-29 (×2): qty 5

## 2015-12-29 MED ORDER — LATANOPROST 0.005 % OP SOLN
1.0000 [drp] | Freq: Every day | OPHTHALMIC | Status: DC
  Administered 2015-12-30 – 2016-01-12 (×13): 1 [drp] via OPHTHALMIC
  Filled 2015-12-29 (×2): qty 2.5

## 2015-12-29 MED ORDER — OCUVITE-LUTEIN PO CAPS
1.0000 | ORAL_CAPSULE | Freq: Two times a day (BID) | ORAL | Status: DC
  Administered 2015-12-30 – 2016-01-04 (×12): 1 via ORAL
  Filled 2015-12-29 (×13): qty 1

## 2015-12-29 MED ORDER — NON FORMULARY: 1.0000 [drp] | Freq: Three times a day (TID) | Status: DC

## 2015-12-29 MED ORDER — BIOTIN 5000 MCG PO CAPS: 10000.0000 ug | ORAL_CAPSULE | Freq: Every day | ORAL | Status: DC

## 2015-12-29 NOTE — ED Notes (Signed)
Pt comes in with right flank pain starting yesterday. Pt admits to nausea and vomiting; denies diarrhea. Pt denies any urinary symptoms. Pt is alert and oriented at this time.

## 2015-12-29 NOTE — ED Notes (Signed)
Admitting at bedside 

## 2015-12-29 NOTE — ED Notes (Signed)
Attempted report x1. 

## 2015-12-29 NOTE — ED Provider Notes (Signed)
CSN: [id number]     Arrival date & time 12/29/15  0750 History   First MD Initiated Contact with Patient 12/29/15 0754     Chief Complaint  Patient presents with  . Flank Pain     (Consider location/radiation/quality/duration/timing/severity/associated sxs/prior Treatment) Patient is a 82 y.o. female presenting with flank pain.  Flank Pain This is a new problem. The current episode started yesterday. The problem occurs constantly. Associated symptoms include abdominal pain. Pertinent negatives include no headaches and no shortness of breath. Nothing aggravates the symptoms. Nothing relieves the symptoms.    Past Medical History  Diagnosis Date  . Hypertension   . Arthritis   . Depression   . Hyperlipidemia   . PFO (patent foramen ovale)     small  . H/O echocardiogram 2008    prolapse of post. mitral leaflet, tr MR, tr TR, EF >55%, mild concentric LVH  . H/O cardiovascular stress test 10/13/2007    low risk scan, no changes from previous test  . Heart palpitations     controlled on BB  . Hiatal hernia   . Cellulitis and abscess of toe of left foot 09/16/2014   Past Surgical History  Procedure Laterality Date  . Cholecystectomy    . Cardiac catheterization  10/2002    normal coronary arteries, normal LV function  . Incision and drainage Right 07/21/2014    Procedure: INCISION AND DRAINAGE;  Surgeon: Dallas Schimke, DPM;  Location: [facility];  Service: Podiatry;  Laterality: Right;  . Incision and drainage Left 09/17/2014    Procedure: INCISION AND DRAINAGE 1ST TOE LEFT FOOT;  Surgeon: Dallas Schimke, DPM;  Location: [facility];  Service: Podiatry;  Laterality: Left;  . Bone biopsy Left 09/17/2014    Procedure: BONE BIOPSY;  Surgeon: Dallas Schimke, DPM;  Location: [facility];  Service: Podiatry;  Laterality: Left;  . Amputation Left 09/20/2014    Procedure: AMPUTATION DIGIT (LEFT GREAT TOE);  Surgeon: Dallas Schimke, DPM;  Location: [facility];  Service:  Podiatry;  Laterality: Left;   Family History  Problem Relation Age of Onset  . Heart disease Mother   . Cancer Father   . Kidney disease Brother   . Hypertension Daughter   . Hyperlipidemia Son    Social History  Substance Use Topics  . Smoking status: Never Smoker   . Smokeless tobacco: Never Used  . Alcohol Use: No   OB History    No data available     Review of Systems  Constitutional: Positive for fever and fatigue.  Respiratory: Negative for cough and shortness of breath.   Gastrointestinal: Positive for nausea, vomiting and abdominal pain.  Genitourinary: Positive for flank pain.  Musculoskeletal: Positive for back pain (right flank). Negative for neck pain and neck stiffness.  Skin: Negative for rash.  Neurological: Positive for weakness. Negative for headaches.  All other systems reviewed and are negative.     Allergies  Codeine and Hydrocodone  Home Medications   Prior to Admission medications   Medication Sig Start Date End Date Taking? Authorizing Provider  aspirin 81 MG chewable tablet Chew 81 mg by mouth daily.   Yes Historical Provider, MD  atenolol (TENORMIN) 100 MG tablet Take 1 tablet by mouth daily. 08/26/13  Yes Historical Provider, MD  Biotin 5000 MCG CAPS Take 10,000 mcg by mouth daily.    Yes Historical Provider, MD  Calcium Carbonate-Vitamin D (CALCIUM + D) 600-200 MG-UNIT TABS Take 1 tablet by mouth daily.  Yes Historical Provider, MD  carboxymethylcellulose (REFRESH PLUS) 0.5 % SOLN Place 1 drop into both eyes 3 (three) times daily as needed (Dry Eyes).   Yes Historical Provider, MD  Cyanocobalamin (VITAMIN B-12 IJ) Inject as directed every 30 (thirty) days.   Yes Historical Provider, MD  latanoprost (XALATAN) 0.005 % ophthalmic solution Place 1 drop into both eyes at bedtime.   Yes Historical Provider, MD  losartan (COZAAR) 100 MG tablet Take 100 mg by mouth daily. 10/14/14  Yes Historical Provider, MD  Multiple Vitamins-Minerals  (PRESERVISION AREDS PO) Take 1 tablet by mouth 2 (two) times daily.   Yes Historical Provider, MD  naproxen (NAPROSYN) 500 MG tablet Take 500 mg by mouth daily. For pain   Yes Historical Provider, MD  ranitidine (ZANTAC) 150 MG tablet Take 150 mg by mouth 2 (two) times daily.   Yes Historical Provider, MD  sertraline (ZOLOFT) 100 MG tablet Take 100 mg by mouth daily.  08/11/13  Yes Historical Provider, MD  silver sulfADIAZINE (SILVADENE) 1 % cream Apply 1 application topically 2 (two) times daily. 08/31/14  Yes Historical Provider, MD  SIMBRINZA 1-0.2 % SUSP Place 1 drop into both eyes 3 (three) times daily. 06/14/14  Yes Historical Provider, MD  simvastatin (ZOCOR) 10 MG tablet Take 10 mg by mouth at bedtime.   Yes Historical Provider, MD  traMADol (ULTRAM) 50 MG tablet Take one or two tablets by mouth every 6 hours as needed for pain 09/22/14  Yes Kimber Relic, MD  ondansetron (ZOFRAN ODT) 4 MG disintegrating tablet  ODT q4 hours prn nausea/vomit 12/29/15   Marily Memos, MD  ondansetron (ZOFRAN) 4 mg TABS tablet Take 4 tablets by mouth every 8 (eight) hours as needed. 12/29/15   Barbara Cower Ersel Wadleigh, MD   BP 124/60 mmHg  Pulse 85  Temp(Src) 98.3 F (36.8 C) (Oral)  Resp 18  Ht  (1.753 m)  Wt 178 lb (80.74 kg)  BMI 26.27 kg/m2  SpO2 92% Physical Exam  Constitutional: She is oriented to person, place, and time. She appears well-developed and well-nourished.  HENT:  Head: Normocephalic and atraumatic.  Neck: Normal range of motion.  Cardiovascular: Normal rate and regular rhythm.   Pulmonary/Chest: Effort normal. No stridor. No respiratory distress. She has no wheezes.  Abdominal: Soft. Bowel sounds are normal. She exhibits no distension and no mass. There is no tenderness. There is no rebound and no guarding.  Musculoskeletal: Normal range of motion. She exhibits no edema or tenderness.  Neurological: She is alert and oriented to person, place, and time. No cranial nerve deficit.  Skin:  Skin is warm and dry.  Nursing note and vitals reviewed.   ED Course  Procedures (including critical care time) Labs Review Labs Reviewed  COMPREHENSIVE METABOLIC PANEL - Abnormal; Notable for the following:    Glucose, Bld 141 (*)    BUN 21 (*)    Creatinine, Ser 1.06 (*)    Total Bilirubin 1.5 (*)    GFR calc non Af Amer 48 (*)    GFR calc Af Amer 56 (*)    All other components within normal limits  CBC - Abnormal; Notable for the following:    WBC 19.2 (*)    All other components within normal limits  URINALYSIS, ROUTINE W REFLEX MICROSCOPIC (NOT AT [facility]) - Abnormal; Notable for the following:    Protein, ur 30 (*)    All other components within normal limits  URINE MICROSCOPIC-ADD ON - Abnormal; Notable for the following:  Squamous Epithelial / LPF 0-5 (*)    All other components within normal limits  LIPASE, BLOOD    Imaging Review Ct Renal Stone Study  12/29/2015  CLINICAL DATA:  Right flank pain starting yesterday. Nausea vomiting. EXAM: CT ABDOMEN AND PELVIS WITHOUT CONTRAST TECHNIQUE: Multidetector CT imaging of the abdomen and pelvis was performed following the standard protocol without IV contrast. COMPARISON:  None. FINDINGS: Lower chest:  Lung bases are clear. Hepatobiliary: Normal liver.  Prior cholecystectomy. Pancreas: Normal. Spleen: Normal. Adrenals/Urinary Tract: Normal adrenal glands. Normal kidneys. Normal bladder. No urolithiasis or obstructive uropathy. Incidental note is made if a right extrarenal pelvis. Stomach/Bowel: No bowel wall thickening or dilatation. No pneumatosis, pneumoperitoneum or portal venous gas. No abdominal or pelvic free fluid. Vascular/Lymphatic: Normal caliber abdominal aorta. Mild abdominal aortic atherosclerosis. No lymphadenopathy. Reproductive: Normal uterus.  No adnexal mass. Other: No fluid collection or hematoma. Fat containing right inguinal hernia. Musculoskeletal: No lytic or sclerotic osseous lesion. Degenerative disc disease  and facet arthropathy throughout the lumbar spine most severe at L4-5. IMPRESSION: 1. No urolithiasis or obstructive uropathy. Electronically Signed   By: Elige Ko   On: 12/29/2015 08:57   I have personally reviewed and evaluated these images and lab results as part of my medical decision-making.   EKG Interpretation None      MDM   Final diagnoses:  Flank pain  Non-intractable vomiting with nausea, vomiting of unspecified type   Flank pain, fever, vomiting. Suspect pyelo, h/o kidney stone so will ct as well. Less suspicion for obstruction, ileus, mesenteric ischemia at this time.  Ct negative, no e/o uti, will culture urine. Symptoms resolved, abdominal exam benign, multiple repeat abdominal exams benign. Prolonged observation in ED mostly because of uncertain etiology of her symptoms, also with leukocytosis. No fever, tolerating PO fine without nausea, vomiting or diarrhea. At this time I am unsure of the cause of her symptoms, suspect food v viral GI illness however with her age, I am cautious in making that diagnosis. 2/2 stability here and unchanging exam or VS we will trial outpatient zofran, but will return here immediately for any new/worsening/persistent symptoms. Also with fever, AMS, worsened pain will come back immediately. otherwise will follow up with PCP in 3-4 days.  Upon discharge, patient too weak too stand up, still with some right flank pain. Unsure of cause of symptoms, however patietn at home with 47 yo husband, will get ct with contrast and discuss with hospitalist regarding admission for further observation and attempted anti-emetics. D/W hospitalist who doesn't think patient needs admission at this time without a clear cause for her symptoms. Will order outpatient face to face [facility]rn/pt/ot, but will wait for CT w/ contrast to ensure no acute processes. Patient with vomiting again, will try reglan as multiple doses of zofran not working.     Marily Memos,  MD 12/29/15 [phone]

## 2015-12-29 NOTE — ED Notes (Signed)
Pt placed back into bed per dr. Clayborne Dana.

## 2015-12-29 NOTE — ED Notes (Signed)
MD notified that pt had difficult time getting into wheelchair on discharge.  MD at bedside.

## 2015-12-29 NOTE — ED Notes (Signed)
Pt tolerating graham crackers and gingerale.  Pt given another gingerale.

## 2015-12-29 NOTE — ED Notes (Signed)
Pt made aware to return if symptoms worsen or if any life threatening symptoms occur.   

## 2015-12-29 NOTE — H&P (Signed)
Patient Demographics  Cassandra [name], is a 82 y.o. female  MRN: [id number]   DOB - 1933/04/28  Admit Date - 12/29/2015  Outpatient Primary MD for the patient is Colette Ribas, MD   With History of -  Past Medical History  Diagnosis Date  . Hypertension   . Arthritis   . Depression   . Hyperlipidemia   . PFO (patent foramen ovale)     small  . H/O echocardiogram 2008    prolapse of post. mitral leaflet, tr MR, tr TR, EF >55%, mild concentric LVH  . H/O cardiovascular stress test 10/13/2007    low risk scan, no changes from previous test  . Heart palpitations     controlled on BB  . Hiatal hernia   . Cellulitis and abscess of toe of left foot 09/16/2014      Past Surgical History  Procedure Laterality Date  . Cholecystectomy    . Cardiac catheterization  10/2002    normal coronary arteries, normal LV function  . Incision and drainage Right 07/21/2014    Procedure: INCISION AND DRAINAGE;  Surgeon: Dallas Schimke, DPM;  Location: [facility];  Service: Podiatry;  Laterality: Right;  . Incision and drainage Left 09/17/2014    Procedure: INCISION AND DRAINAGE 1ST TOE LEFT FOOT;  Surgeon: Dallas Schimke, DPM;  Location: [facility];  Service: Podiatry;  Laterality: Left;  . Bone biopsy Left 09/17/2014    Procedure: BONE BIOPSY;  Surgeon: Dallas Schimke, DPM;  Location: [facility];  Service: Podiatry;  Laterality: Left;  . Amputation Left 09/20/2014    Procedure: AMPUTATION DIGIT (LEFT GREAT TOE);  Surgeon: Dallas Schimke, DPM;  Location: [facility];  Service: Podiatry;  Laterality: Left;    in for   Chief Complaint  Patient presents with  . Flank Pain     HPI  Cassandra [name]  is a 82 y.o. female, with past medical history of hypertension, arthritis, hyperlipidemia, left leg chronic ulcer, presents with complaints of generalized weakness, right flank pain, nausea, vomiting, diarrhea, over last 48 hours, patient reports she's been feeling ill over  last 48 hours, poor appetite, generalized weakness, fever and feeling feverish,, had vomiting started yesterday, as well diarrhea, soft stool, denies any coffee-ground emesis, any leg or bright red blood per rectum, she is afebrile in ED, workup significant for leukocytosis, giving her right flank pain, she had renal stone CT protocol and CT abdomen pelvis with IV contrast with no acute findings, patient reports she had laser procedure done by her ophthalmologist secondary to glaucoma at his office yesterday, reports she's been feeling ill before that, denies any eye pain, or blurry vision or visual changes, hospitalist requested to admit the patient for her clinical dehydration.    Review of Systems    In addition to the HPI above, reports feeling feverish No Headache, No changes with Vision or hearing, No problems swallowing food or Liquids, No Chest pain, Cough or Shortness of Breath,  reports right flank pain, generalized abdominal pain, reports nausea and vomiting and diarrhea No Blood in stool or Urine, No dysuria, No new skin rashes or bruises, No new joints pains-aches,  No new weakness, tingling, numbness in any extremity, reports generalized weakness, but nothing focal No recent weight gain or loss, No polyuria, polydypsia or polyphagia, No significant Mental Stressors.  A full 10 point Review of Systems was done, except as stated above, all other Review of Systems were negative.   Social History  Social History  Substance Use Topics  . Smoking status: Never Smoker   . Smokeless tobacco: Never Used  . Alcohol Use: No   Family History Family History  Problem Relation Age of Onset  . Heart disease Mother   . Cancer Father   . Kidney disease Brother   . Hypertension Daughter   . Hyperlipidemia Son      Prior to Admission medications   Medication Sig Start Date End Date Taking? Authorizing Provider  aspirin 81 MG chewable tablet Chew 81 mg by mouth daily.   Yes  Historical Provider, MD  atenolol (TENORMIN) 100 MG tablet Take 1 tablet by mouth daily. 08/26/13  Yes Historical Provider, MD  Biotin 5000 MCG CAPS Take 10,000 mcg by mouth daily.    Yes Historical Provider, MD  Calcium Carbonate-Vitamin D (CALCIUM + D) 600-200 MG-UNIT TABS Take 1 tablet by mouth daily.   Yes Historical Provider, MD  carboxymethylcellulose (REFRESH PLUS) 0.5 % SOLN Place 1 drop into both eyes 3 (three) times daily as needed (Dry Eyes).   Yes Historical Provider, MD  Cyanocobalamin (VITAMIN B-12 IJ) Inject as directed every 30 (thirty) days.   Yes Historical Provider, MD  latanoprost (XALATAN) 0.005 % ophthalmic solution Place 1 drop into both eyes at bedtime.   Yes Historical Provider, MD  losartan (COZAAR) 100 MG tablet Take 100 mg by mouth daily. 10/14/14  Yes Historical Provider, MD  Multiple Vitamins-Minerals (PRESERVISION AREDS PO) Take 1 tablet by mouth 2 (two) times daily.   Yes Historical Provider, MD  naproxen (NAPROSYN) 500 MG tablet Take 500 mg by mouth daily. For pain   Yes Historical Provider, MD  ranitidine (ZANTAC) 150 MG tablet Take 150 mg by mouth 2 (two) times daily.   Yes Historical Provider, MD  sertraline (ZOLOFT) 100 MG tablet Take 100 mg by mouth daily.  08/11/13  Yes Historical Provider, MD  silver sulfADIAZINE (SILVADENE) 1 % cream Apply 1 application topically 2 (two) times daily. 08/31/14  Yes Historical Provider, MD  SIMBRINZA 1-0.2 % SUSP Place 1 drop into both eyes 3 (three) times daily. 06/14/14  Yes Historical Provider, MD  simvastatin (ZOCOR) 10 MG tablet Take 10 mg by mouth at bedtime.   Yes Historical Provider, MD  traMADol (ULTRAM) 50 MG tablet Take one or two tablets by mouth every 6 hours as needed for pain 09/22/14  Yes Kimber Relic, MD  ondansetron (ZOFRAN ODT) 4 MG disintegrating tablet  ODT q4 hours prn nausea/vomit 12/29/15   Marily Memos, MD  ondansetron (ZOFRAN) 4 mg TABS tablet Take 4 tablets by mouth every 8 (eight) hours as needed.  12/29/15   Marily Memos, MD    Allergies  Allergen Reactions  . Codeine Shortness Of Breath and Nausea And Vomiting  . Hydrocodone Shortness Of Breath and Nausea And Vomiting    Physical Exam  Vitals  Blood pressure 134/61, pulse 84, temperature 98.3 F (36.8 C), temperature source Oral, resp. rate 18, height  (1.753 m), weight 80.74 kg (178 lb), SpO2 94 %.   1. General  frail, elderly femalelying in bed in NAD,    2. Normal affect and insight, Not Suicidal or Homicidal, Awake Alert, Oriented X 3.  3. No F.N deficits, ALL C.Nerves Intact, Strength 5/5 all 4 extremities, Sensation intact all 4 extremities, Plantars down going.  4. Ears and Eyes appear Normal, Conjunctivae clear, PERRLA. Dry Oral Mucosa.  5. Supple Neck, No JVD, No cervical lymphadenopathy appriciated, No Carotid Bruits.  6. Symmetrical  Chest wall movement, Good air movement bilaterally, CTAB.  7. RRR, No Gallops, Rubs or Murmurs, No Parasternal Heave.  8. Positive Bowel Sounds, Abdomen Soft, No tenderness, No organomegaly appriciated,No rebound -guarding or rigidity.  9.  No Cyanosis, delayed Skin Turgor, No Skin Rash or Bruise.  10. Good muscle tone,  joints appear normal , no effusions, Normal ROM.  11. No Palpable Lymph Nodes in Neck or Axillae    Data Review  CBC  Recent Labs Lab 12/29/15 0806  WBC 19.2*  HGB 13.2  HCT 39.5  PLT 191  MCV 91.6  MCH 30.6  MCHC 33.4  RDW 12.8   ------------------------------------------------------------------------------------------------------------------  Chemistries   Recent Labs Lab 12/29/15 0806  NA 137  K 3.9  CL 101  CO2 25  GLUCOSE 141*  BUN 21*  CREATININE 1.06*  CALCIUM 9.4  AST 24  ALT 16  ALKPHOS 79  BILITOT 1.5*   ------------------------------------------------------------------------------------------------------------------ estimated creatinine clearance is 48.1 mL/min (by C-G formula based on Cr of  1.06). ------------------------------------------------------------------------------------------------------------------ No results for input(s): TSH, T4TOTAL, T3FREE, THYROIDAB in the last 72 hours.  Invalid input(s): FREET3   Coagulation profile No results for input(s): INR, PROTIME in the last 168 hours. ------------------------------------------------------------------------------------------------------------------- No results for input(s): DDIMER in the last 72 hours. -------------------------------------------------------------------------------------------------------------------  Cardiac Enzymes No results for input(s): CKMB, TROPONINI, MYOGLOBIN in the last 168 hours.  Invalid input(s): CK ------------------------------------------------------------------------------------------------------------------ Invalid input(s): POCBNP   ---------------------------------------------------------------------------------------------------------------  Urinalysis    Component Value Date/Time   COLORURINE YELLOW 12/29/2015 0900   APPEARANCEUR CLEAR 12/29/2015 0900   LABSPEC 1.020 12/29/2015 0900   PHURINE 6.5 12/29/2015 0900   GLUCOSEU NEGATIVE 12/29/2015 0900   HGBUR NEGATIVE 12/29/2015 0900   BILIRUBINUR NEGATIVE 12/29/2015 0900   KETONESUR NEGATIVE 12/29/2015 0900   PROTEINUR 30* 12/29/2015 0900   UROBILINOGEN 0.2 09/20/2014 1839   NITRITE NEGATIVE 12/29/2015 0900   LEUKOCYTESUR NEGATIVE 12/29/2015 0900    ----------------------------------------------------------------------------------------------------------------  Imaging results:   Dg Chest 2 View  12/29/2015  CLINICAL DATA:  RIGHT flank pain beginning yesterday, nausea, vomiting, hypertension EXAM: CHEST  2 VIEW COMPARISON:  05/22/2013 FINDINGS: Borderline enlargement of cardiac silhouette Atherosclerotic calcification aorta. Mediastinal contours stable. Minimal pulmonary vascular congestion. Accentuation of  interstitial markings in the perihilar regions versus previous exam, cannot exclude minimal edema. No gross pleural effusion or pneumothorax. Bones unremarkable. IMPRESSION: Accentuated interstitial markings versus previous exam particularly in perihilar regions question minimal edema. Electronically Signed   By: Ulyses Southward M.D.   On: 12/29/2015 17:31   Ct Abdomen Pelvis W Contrast  12/29/2015  CLINICAL DATA:  Right flank pain since January 25th. Negative renal stone protocol CT earlier today. Additional history of hypertension, hiatal hernia and cholecystectomy. EXAM: CT ABDOMEN AND PELVIS WITH CONTRAST TECHNIQUE: Multidetector CT imaging of the abdomen and pelvis was performed using the standard protocol following bolus administration of intravenous contrast. CONTRAST:  OMNIPAQUE IOHEXOL 300 MG/ML  SOLN COMPARISON:  Noncontrast abdomen and pelvis CT dated 12/29/2015. Comparison also made to CT abdomen pelvis dated 05/22/2013. FINDINGS: Lower chest: Mild dependent atelectasis at each lung base. Otherwise unremarkable. Hepatobiliary: Liver appears normal. Patient is status post cholecystectomy with associated mild bile duct ectasia. Pancreas: No mass, inflammatory changes, or other significant abnormality. Spleen: Upper normal in size. Adrenals/Urinary Tract: Adrenal glands are unremarkable. Questionable small stable nodules within each adrenal gland, of benign etiology. Kidneys appear normal without stone or hydronephrosis. No ureteral or bladder calculi identified. Bladder appears normal. Stomach/Bowel: Bowel is normal in caliber. No bowel  wall thickening or evidence of bowel wall inflammation. Appendix is normal. Vascular/Lymphatic: Scattered mild atherosclerotic changes of the normal- caliber abdominal aorta. No acute vascular abnormality seen. No enlarged lymph nodes within the abdomen or pelvis. Reproductive: Unremarkable. Small amount of free fluid in the pelvis is likely physiologic in nature.  Other: No significant free fluid. No abscess collection. No free intraperitoneal air. Musculoskeletal: Degenerative changes within the lumbar spine, stable in appearance compared to the previous CT of 05/22/2013. No acute- appearing osseous abnormality. 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 Superficial soft tissues are unremarkable. IMPRESSION: 1. No source for right flank pain identified, unless related to the chronic degenerative changes in the lumbar spine. No evidence of acute intra-abdominal or intrapelvic abnormality. 2. Additional incidental/chronic findings detailed above. Electronically Signed   By: Bary Richard M.D.   On: 12/29/2015 17:52   Ct Renal Stone Study  12/29/2015  CLINICAL DATA:  Right flank pain starting yesterday. Nausea vomiting. EXAM: CT ABDOMEN AND PELVIS WITHOUT CONTRAST TECHNIQUE: Multidetector CT imaging of the abdomen and pelvis was performed following the standard protocol without IV contrast. COMPARISON:  None. FINDINGS: Lower chest:  Lung bases are clear. Hepatobiliary: Normal liver.  Prior cholecystectomy. Pancreas: Normal. Spleen: Normal. Adrenals/Urinary Tract: Normal adrenal glands. Normal kidneys. Normal bladder. No urolithiasis or obstructive uropathy. Incidental note is made if a right extrarenal pelvis. Stomach/Bowel: No bowel wall thickening or dilatation. No pneumatosis, pneumoperitoneum or portal venous gas. No abdominal or pelvic free fluid. Vascular/Lymphatic: Normal caliber abdominal aorta. Mild abdominal aortic atherosclerosis. No lymphadenopathy. Reproductive: Normal uterus.  No adnexal mass. Other: No fluid collection or hematoma. Fat containing right inguinal hernia. Musculoskeletal: No lytic or sclerotic osseous lesion. Degenerative disc disease and facet arthropathy throughout the lumbar spine most severe at L4-5. IMPRESSION: 1. No urolithiasis or obstructive uropathy. Electronically Signed   By:  Elige Ko   On: 12/29/2015 08:57      Assessment & Plan  Active Problems:   Hypertension   Hyperlipidemia   Gastroenteritis   Glaucoma  Nausea/vomiting/abdominal pain/diarrhea - This is most likely related to gastroenteritis, most likely viral, CT abdomen pelvis with no acute findings, is most likely viral, no indication for antibiotics, negative urinalysis. - We'll continue with when necessary nausea medication, continue gentle hydration(will monitor closely as evidence of early volume overload and her chest x-ray)  Dehydration - She clinically dehydrated, already received 2 L of IV fluids in ED, even with that has dried lips, continue gentle hydration, monitor closely especially with evidence of early volume overload on her chest x-ray  Weakness - Continue to gastroenteritis, PT consult and a.m.  Hypertension - Hold her meds until she is more stable  Hyperlipidemia - Continue statin  Glaucoma - Continue home eyedrops, denies any eye pain, visual problems or headache.   DVT Prophylaxis Heparin -  AM Labs Ordered, also please review Full Orders  Family Communication: Admission, patients condition and plan of care including tests being ordered have been discussed with the patient and daughter who indicate understanding and agree with the plan and Code Status.  Code Status Full  Likely DC : pending PT evaluation  Condition GUARDED   Time spent in minutes : 55 minutes    Kloey Cazarez M.D on 12/29/2015 at 6:23 PM  Between 7am to 7pm - Pager - [phone]  After 7pm go to [email or web address] - password [email or web address]  And look for the night coverage person covering me after hours  [facility]  Group Office  [phone]

## 2015-12-29 NOTE — ED Notes (Signed)
Pt given ginger ale and graham crackers.

## 2015-12-29 NOTE — ED Notes (Signed)
Pt placed on 2L 02 [place] due to sats 78 after emesis, dr. Clayborne Dana aware.

## 2015-12-29 NOTE — ED Notes (Signed)
Pt taken to xr with maryann.

## 2015-12-30 ENCOUNTER — Observation Stay (HOSPITAL_COMMUNITY): Payer: Medicare Other

## 2015-12-30 DIAGNOSIS — R7989 Other specified abnormal findings of blood chemistry: Secondary | ICD-10-CM

## 2015-12-30 DIAGNOSIS — E785 Hyperlipidemia, unspecified: Secondary | ICD-10-CM | POA: Diagnosis not present

## 2015-12-30 DIAGNOSIS — K529 Noninfective gastroenteritis and colitis, unspecified: Secondary | ICD-10-CM | POA: Diagnosis not present

## 2015-12-30 DIAGNOSIS — I10 Essential (primary) hypertension: Secondary | ICD-10-CM | POA: Diagnosis not present

## 2015-12-30 LAB — TROPONIN I
Troponin I: 0.04 ng/mL — ABNORMAL HIGH (ref <0.031)
Troponin I: 0.04 ng/mL — ABNORMAL HIGH (ref <0.031)
Troponin I: 0.04 ng/mL — ABNORMAL HIGH (ref <0.031)

## 2015-12-30 LAB — COMPREHENSIVE METABOLIC PANEL
ALK PHOS: 71 U/L (ref 38–126)
ALT: 21 U/L (ref 14–54)
AST: 25 U/L (ref 15–41)
Albumin: 3 g/dL — ABNORMAL LOW (ref 3.5–5.0)
Anion gap: 8 (ref 5–15)
BUN: 19 mg/dL (ref 6–20)
CALCIUM: 8.1 mg/dL — AB (ref 8.9–10.3)
CHLORIDE: 105 mmol/L (ref 101–111)
CO2: 22 mmol/L (ref 22–32)
CREATININE: 1.09 mg/dL — AB (ref 0.44–1.00)
GFR calc non Af Amer: 47 mL/min — ABNORMAL LOW (ref >60)
GFR, EST AFRICAN AMERICAN: 54 mL/min — AB (ref >60)
Glucose, Bld: 108 mg/dL — ABNORMAL HIGH (ref 65–99)
Potassium: 3.4 mmol/L — ABNORMAL LOW (ref 3.5–5.1)
SODIUM: 135 mmol/L (ref 135–145)
Total Bilirubin: 0.7 mg/dL (ref 0.3–1.2)
Total Protein: 6 g/dL — ABNORMAL LOW (ref 6.5–8.1)

## 2015-12-30 LAB — CBC
HCT: 36 % (ref 36.0–46.0)
Hemoglobin: 12 g/dL (ref 12.0–15.0)
MCH: 30.7 pg (ref 26.0–34.0)
MCHC: 33.3 g/dL (ref 30.0–36.0)
MCV: 92.1 fL (ref 78.0–100.0)
PLATELETS: 133 10*3/uL — AB (ref 150–400)
RBC: 3.91 MIL/uL (ref 3.87–5.11)
RDW: 13.2 % (ref 11.5–15.5)
WBC: 10.2 10*3/uL (ref 4.0–10.5)

## 2015-12-30 LAB — C DIFFICILE QUICK SCREEN W PCR REFLEX
C DIFFICLE (CDIFF) ANTIGEN: NEGATIVE
C Diff interpretation: NEGATIVE
C Diff toxin: NEGATIVE

## 2015-12-30 MED ORDER — LOPERAMIDE HCL 2 MG PO CAPS: 2.0000 mg | ORAL_CAPSULE | ORAL | Status: DC | PRN

## 2015-12-30 MED ORDER — CETYLPYRIDINIUM CHLORIDE 0.05 % MT LIQD
7.0000 mL | Freq: Two times a day (BID) | OROMUCOSAL | Status: DC
  Administered 2015-12-30 – 2016-01-13 (×30): 7 mL via OROMUCOSAL

## 2015-12-30 MED ORDER — GI COCKTAIL ~~LOC~~
30.0000 mL | Freq: Once | ORAL | Status: AC
  Administered 2015-12-30: 30 mL via ORAL
  Filled 2015-12-30: qty 30

## 2015-12-30 NOTE — Progress Notes (Signed)
Md paged and made aware of patients elevated temp.  Tylenol given. Will continue to monitor and reassess

## 2015-12-30 NOTE — Plan of Care (Signed)
Problem: Acute Rehab PT Goals(only PT should resolve) Goal: Patient Will Transfer Sit To/From Stand Pt will transfer sit to/from-stand with RW at ModI without loss-of-balance to demonstrate good safety awareness for independent mobility in home.      Goal: Pt Will Ambulate Pt will ambulate with LRAD at Supervision using a step-through pattern and equal step length for a distances greater than 273ft to demonstrate the ability to perform safe household distance ambulation at discharge.    Goal: Pt Will Go Up/Down Stairs Pt will ascend/descend 6 stairs with LRAD and 1 HR at Supervision to demonstrate safe entry/exit of home.

## 2015-12-30 NOTE — Progress Notes (Signed)
4403 Patient c/o pressure like chest pain with pain radiating toward LEFT arm. BP-136/58, P-81, R-18, O2 2L -100%, T-99.5. MD notified and the following new orders given: 1.) STAT 12 lead EKG 2.) STAT Troponin 3.) STAT GI cocktail

## 2015-12-30 NOTE — Progress Notes (Signed)
Patient has had 2 incontinent large loose stools this shift.

## 2015-12-30 NOTE — Care Management Note (Signed)
Case Management Note  Patient Details  Name: HALYN FLAUGHER MRN: [id number] Date of Birth: [birth year]  Subjective/Objective:                  Pt is from home, lives with husband and is ind with ALD's at baseline. Pt has RW but does not use it at baseline. Pt has used [facility] services in the past and would like to use them again. PT has recommended SNF, pt has refused and wishes to go home with [facility] services. Alroy Bailiff, of [facility], made aware of referral and will obtain pt info from chart. Pt understands HH has 48 hours to make first visit.   Action/Plan: No further CM needs.   Expected Discharge Date:   12/31/2015               Expected Discharge Plan:  Home w Home Health Services  In-House Referral:  NA  Discharge planning Services  CM Consult  Post Acute Care Choice:  Home Health Choice offered to:  Patient  DME Arranged:    DME Agency:     HH Arranged:  RN, PT HH Agency:  [facility]  Status of Service:  Completed, signed off  Medicare Important Message Given:    Date Medicare IM Given:    Medicare IM give by:    Date Additional Medicare IM Given:    Additional Medicare Important Message give by:     If discussed at Long Length of Stay Meetings, dates discussed:    Additional Comments:  Malcolm Metro, RN 12/30/2015, 3:20 PM

## 2015-12-30 NOTE — Evaluation (Addendum)
Physical Therapy Evaluation Patient Details Name: Cassandra [name] MRN: [id number] DOB: 08/28/1933 Today's Date: 12/30/2015   History of Present Illness  82yo white female who presents to [facility] on 12/29/15 after 2 days of N/V, R flank pain, and generalized weakness. Pt admitted for Gastroenteritis after workup. At baseline, pt reports she is a fully indep community ambulator without AD. PMH: HTN, HLD, and L hallux amputation 1ya.   Clinical Impression  Pt is received semirecumbent in bed upon entry, sleep and remaining lethargic after aroused. Pt is reluctant but agreeable to participate in PT evaluation. Pt is A&Ox3 and pleasant, a good historian, but demonstrating significantly delayed response, up to 3 seconds after each question. Pt is fully independent at baseline, but today is profoundly weak and requires total assist for all bed mobility and transfers.Patient presenting with impairment of strength, range of motion, balance, and activity tolerance, limiting ability to perform ADL and mobility tasks at  baseline level of function. Patient will benefit from skilled intervention to address the above impairments and limitations, in order to restore to prior level of function, improve patient safety upon discharge, and to decrease falls risk. PT recommending DC to STR/SNF prior to eventual return to home.      Follow Up Recommendations SNF    Equipment Recommendations  None recommended by PT    Recommendations for Other Services       Precautions / Restrictions Precautions Precautions: None Restrictions Weight Bearing Restrictions: No      Mobility  Bed Mobility Overal bed mobility: +2 for physical assistance;Needs Assistance Bed Mobility: Supine to Sit;Sit to Supine     Supine to sit: Total assist;+2 for physical assistance Sit to supine: Total assist;+2 for physical assistance      Transfers Overall transfer level: Needs assistance Equipment used: 1 person hand held assist  (none, dependent style transfer. ) Transfers: Sit to/from Stand Sit to Stand: Total assist;+2 physical assistance;From elevated surface         General transfer comment: profound weakness of trunk and hips, not following simple commands well at this time.   Ambulation/Gait                Stairs            Wheelchair Mobility    Modified Rankin (Stroke Patients Only)       Balance Overall balance assessment: Needs assistance   Sitting balance-Leahy Scale: Zero       Standing balance-Leahy Scale: Zero                               Pertinent Vitals/Pain Pain Assessment: No/denies pain    Home Living Family/patient expects to be discharged to:: Private residence Living Arrangements: Spouse/significant other Available Help at Discharge: Family Type of Home: House Home Access: Stairs to enter   [employer] of Steps: 3 steps on front porch c 1 handrail  Home Layout: One level;Laundry or work area in [employer] Equipment: Environmental consultant - 2 wheels      Prior Function Level of Independence: Independent               Hand Dominance        Extremity/Trunk Assessment   Upper Extremity Assessment: Generalized weakness           Lower Extremity Assessment: Generalized weakness      Cervical / Trunk Assessment:  (profound weakness. )  Communication  Cognition Arousal/Alertness: Lethargic Behavior During Therapy: WFL for tasks assessed/performed (delayed response. ) Overall Cognitive Status: No family/caregiver present to determine baseline cognitive functioning                      General Comments      Exercises        Assessment/Plan    PT Assessment Patient needs continued PT services  PT Diagnosis Difficulty walking;Generalized weakness;Altered mental status   PT Problem List Decreased strength;Decreased range of motion;Decreased cognition;Decreased activity tolerance;Decreased  balance;Decreased mobility  PT Treatment Interventions Gait training;Stair training;Functional mobility training;Therapeutic activities;Therapeutic exercise;Patient/family education   PT Goals (Current goals can be found in the Care Plan section) Acute Rehab PT Goals Patient Stated Goal: feel better, regain indep in mobility.  PT Goal Formulation: With patient Time For Goal Achievement: 01/13/16 Potential to Achieve Goals: Fair    Frequency Min 3X/week   Barriers to discharge Inaccessible home environment      Co-evaluation               End of Session Equipment Utilized During Treatment: Gait belt Activity Tolerance: Patient limited by lethargy (limited by generally feeling poor and weak.) Patient left: in bed;with call bell/phone within reach;with nursing/sitter in room Nurse Communication: Mobility status;Other (comment)         Time: 1610-9604 PT Time Calculation (min) (ACUTE ONLY): 11 min   Charges:   PT Evaluation $PT Eval Moderate Complexity: 1 Procedure     PT G Codes:     PT G-Codes **NOT FOR INPATIENT CLASS** Functional Assessment Tool Used: Clinical Judgment  Functional Limitation: Mobility: Walking and moving around Mobility: Walking and Moving Around Current Status ([id number]): At least 80 percent but less than 100 percent impaired, limited or restricted Mobility: Walking and Moving Around Goal Status [phone]): At least 60 percent but less than 80 percent impaired, limited or restricted    11:46 AM, 01-18-2016 Rosamaria Lints, PT, DPT PRN Physical Therapist at [facility] [place] License # [id number] [phone] (wireless)  [phone] (mobile)   * Addendum to update G-Codes 12:13 PM, 2016/01/18 Rosamaria Lints, PT, DPT PRN Physical Therapist at [facility] License # [id number] [phone] (wireless)  [phone] (mobile)

## 2015-12-30 NOTE — Care Management Obs Status (Signed)
MEDICARE OBSERVATION STATUS NOTIFICATION   Patient Details  Name: Cassandra [name] MRN: [id number] Date of Birth: Dec 26, 1932   Medicare Observation Status Notification Given:  Yes    Malcolm Metro, RN 12/30/2015, 3:20 PM

## 2015-12-30 NOTE — Progress Notes (Signed)
[facility] PROGRESS NOTE  Cassandra [name] ZOX:[id number] DOB: 03-01-1933 DOA: 12/29/2015 PCP: Colette Ribas, MD  Assessment/Plan: 1. Nausea/Vomiting/diarrhea, abd pain. Likely related to viral gastroenteritis. Abd/pelvis CT negative for acute findings. Continue symptomatic management and IVF. Advance diet as tolerated. WBC is now wnl, patient has mild fever. UA is negative.  2. Elevated troponin. Patient had mild chest pain overnight. Denies any chest pain at this time. She has a mildly elevated troponin which may be demand ischemia in the setting of dehydration. Will trend troponin and repeat EKG.  3. Dehydration, improving with IVF. 4. Weakness, likely related to number 1, PT has consulted and recommended SNF upon discharge  5. HTN, BP stable 6. HLD, continue statin 7. Glaucoma, continue home management  Code Status: Full DVT prophylaxis: heparin Family Communication: no family at bedside Disposition Plan: Anticipate discharge within 3-4 days   Consultants:  PT - SNF  Procedures:  none  Antibiotics:  none  HPI/Subjective: Patient still has abd pain and diarrhea. Has not eaten today. Had CP last night but denies any currently.  Objective: Filed Vitals:   12/30/15 0440 12/30/15 0900  BP: 136/58   Pulse: 81   Temp: 99.5 F (37.5 C) 100.2 F (37.9 C)  Resp: 18     Intake/Output Summary (Last 24 hours) at 12/30/15 1203 Last data filed at 12/30/15 0830  Gross per 24 hour  Intake    120 ml  Output    200 ml  Net    -80 ml   Filed Weights   12/29/15 0759 12/29/15 1957  Weight: 80.74 kg (178 lb) 81.2 kg (179 lb 0.2 oz)    Exam:  General: NAD, looks comfortable Cardiovascular: RRR, S1, S2  Respiratory: clear bilaterally, No wheezing, rales or rhonchi Abdomen: soft, non tender, no distention , bowel sounds normal Musculoskeletal: No edema b/l   Data Reviewed: Basic Metabolic Panel:  Recent Labs Lab 12/29/15 0806 12/30/15 0501  NA 137 135   K 3.9 3.4*  CL 101 105  CO2 25 22  GLUCOSE 141* 108*  BUN 21* 19  CREATININE 1.06* 1.09*  CALCIUM 9.4 8.1*   Liver Function Tests:  Recent Labs Lab 12/29/15 0806 12/30/15 0501  AST 24 25  ALT 16 21  ALKPHOS 79 71  BILITOT 1.5* 0.7  PROT 7.2 6.0*  ALBUMIN 3.8 3.0*    Recent Labs Lab 12/29/15 0806  LIPASE 15   CBC:  Recent Labs Lab 12/29/15 0806 12/30/15 0501  WBC 19.2* 10.2  HGB 13.2 12.0  HCT 39.5 36.0  MCV 91.6 92.1  PLT 191 133*   Cardiac Enzymes:  Recent Labs Lab 12/30/15 0501  TROPONINI 0.04*     Recent Results (from the past 240 hour(s))  C difficile quick scan w PCR reflex     Status: None   Collection Time: 12/30/15 12:28 AM  Result Value Ref Range Status   C Diff antigen NEGATIVE NEGATIVE Final   C Diff toxin NEGATIVE NEGATIVE Final   C Diff interpretation Negative for toxigenic C. difficile  Final    Comment: NEGATIVE     Studies: Dg Chest 2 View  12/29/2015  CLINICAL DATA:  RIGHT flank pain beginning yesterday, nausea, vomiting, hypertension EXAM: CHEST  2 VIEW COMPARISON:  05/22/2013 FINDINGS: Borderline enlargement of cardiac silhouette Atherosclerotic calcification aorta. Mediastinal contours stable. Minimal pulmonary vascular congestion. Accentuation of interstitial markings in the perihilar regions versus previous exam, cannot exclude minimal edema. No gross pleural effusion or pneumothorax. Bones unremarkable. IMPRESSION:  Accentuated interstitial markings versus previous exam particularly in perihilar regions question minimal edema. Electronically Signed   By: Ulyses Southward M.D.   On: 12/29/2015 17:31   Ct Abdomen Pelvis W Contrast  12/29/2015  CLINICAL DATA:  Right flank pain since January 25th. Negative renal stone protocol CT earlier today. Additional history of hypertension, hiatal hernia and cholecystectomy. EXAM: CT ABDOMEN AND PELVIS WITH CONTRAST TECHNIQUE: Multidetector CT imaging of the abdomen and pelvis was performed using the  standard protocol following bolus administration of intravenous contrast. CONTRAST:  OMNIPAQUE IOHEXOL 300 MG/ML  SOLN COMPARISON:  Noncontrast abdomen and pelvis CT dated 12/29/2015. Comparison also made to CT abdomen pelvis dated 05/22/2013. FINDINGS: Lower chest: Mild dependent atelectasis at each lung base. Otherwise unremarkable. Hepatobiliary: Liver appears normal. Patient is status post cholecystectomy with associated mild bile duct ectasia. Pancreas: No mass, inflammatory changes, or other significant abnormality. Spleen: Upper normal in size. Adrenals/Urinary Tract: Adrenal glands are unremarkable. Questionable small stable nodules within each adrenal gland, of benign etiology. Kidneys appear normal without stone or hydronephrosis. No ureteral or bladder calculi identified. Bladder appears normal. Stomach/Bowel: Bowel is normal in caliber. No bowel wall thickening or evidence of bowel wall inflammation. Appendix is normal. Vascular/Lymphatic: Scattered mild atherosclerotic changes of the normal- caliber abdominal aorta. No acute vascular abnormality seen. No enlarged lymph nodes within the abdomen or pelvis. Reproductive: Unremarkable. Small amount of free fluid in the pelvis is likely physiologic in nature. Other: No significant free fluid. No abscess collection. No free intraperitoneal air. Musculoskeletal: Degenerative changes within the lumbar spine, stable in appearance compared to the previous CT of 05/22/2013. No acute- appearing osseous abnormality. 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 Superficial soft tissues are unremarkable. IMPRESSION: 1. No source for right flank pain identified, unless related to the chronic degenerative changes in the lumbar spine. No evidence of acute intra-abdominal or intrapelvic abnormality. 2. Additional incidental/chronic findings detailed above. Electronically Signed   By: Bary Richard M.D.   On:  12/29/2015 17:52   Ct Renal Stone Study  12/29/2015  CLINICAL DATA:  Right flank pain starting yesterday. Nausea vomiting. EXAM: CT ABDOMEN AND PELVIS WITHOUT CONTRAST TECHNIQUE: Multidetector CT imaging of the abdomen and pelvis was performed following the standard protocol without IV contrast. COMPARISON:  None. FINDINGS: Lower chest:  Lung bases are clear. Hepatobiliary: Normal liver.  Prior cholecystectomy. Pancreas: Normal. Spleen: Normal. Adrenals/Urinary Tract: Normal adrenal glands. Normal kidneys. Normal bladder. No urolithiasis or obstructive uropathy. Incidental note is made if a right extrarenal pelvis. Stomach/Bowel: No bowel wall thickening or dilatation. No pneumatosis, pneumoperitoneum or portal venous gas. No abdominal or pelvic free fluid. Vascular/Lymphatic: Normal caliber abdominal aorta. Mild abdominal aortic atherosclerosis. No lymphadenopathy. Reproductive: Normal uterus.  No adnexal mass. Other: No fluid collection or hematoma. Fat containing right inguinal hernia. Musculoskeletal: No lytic or sclerotic osseous lesion. Degenerative disc disease and facet arthropathy throughout the lumbar spine most severe at L4-5. IMPRESSION: 1. No urolithiasis or obstructive uropathy. Electronically Signed   By: Elige Ko   On: 12/29/2015 08:57    Scheduled Meds: . antiseptic oral rinse  7 mL Mouth Rinse BID  . aspirin  81 mg Oral Daily  . brinzolamide  1 drop Both Eyes TID   And  . brimonidine  1 drop Both Eyes TID  . calcium-vitamin D  1 tablet Oral Daily  . famotidine  10 mg Oral BID  . heparin  5,000 Units Subcutaneous 3 times per  day  . latanoprost  1 drop Both Eyes QHS  . multivitamin-lutein  1 capsule Oral BID  . sertraline  100 mg Oral Daily  . silver sulfADIAZINE  1 application Topical BID  . simvastatin  10 mg Oral QHS   Continuous Infusions: . sodium chloride 75 mL/hr at 12/29/15 2017    Active Problems:   Hypertension   Hyperlipidemia   Gastroenteritis    Glaucoma   Time spent: 25 minutes   Lilu Mcglown. MD  [facility] Pager [phone]. If 7PM-7AM, please contact night-coverage at [email or web address], password [facility] 12/30/2015, 12:03 PM      By signing my name below, I, Burnett Harry, attest that this documentation has been prepared under the direction and in the presence of [facility]. MD Electronically Signed: Burnett Harry, Scribe. 12/30/2015 12:01pm   I, Dr. Erick Blinks, personally performed the services described in this documentaiton. All medical record entries made by the scribe were at my direction and in my presence. I have reviewed the chart and agree that the record reflects my personal performance and is accurate and complete  Erick Blinks, MD, 12/30/2015 12:21 PM

## 2015-12-30 NOTE — [place] FL2 (Signed)
[place] MEDICAID FL2 LEVEL OF CARE SCREENING TOOL     IDENTIFICATION  Patient Name: Cassandra [name] Birthdate: [birth year] Sex: female Admission Date (Current Location): 12/29/2015  [facility] and [place] Number:  Producer, television/film/video and Address:  [facility],  [address], [place] [address]      Provider Number: [id number]  Attending Physician Name and Address:  Erick Blinks, MD  Relative Name and Phone Number:       Current Level of Care: Hospital Recommended Level of Care: Skilled Nursing Facility Prior Approval Number:    Date Approved/Denied:   PASRR Number:    Discharge Plan: SNF    Current Diagnoses: Patient Active Problem List   Diagnosis Date Noted  . Gastroenteritis 12/29/2015  . Glaucoma 12/29/2015  . Normocytic anemia 09/17/2014  . Septic arthritis of interphalangeal joint of toe of left foot (HCC) 09/16/2014  . Cellulitis and abscess of toe of left foot 09/16/2014  . Osteomyelitis of toe of left foot (HCC) 09/16/2014  . Hypertension   . Hyperlipidemia   . PFO (patent foramen ovale)   . H/O echocardiogram   . Heart palpitations     Orientation RESPIRATION BLADDER Height & Weight    Self, Time, Situation, Place  O2 Continent  (175.3 cm) 174 lbs.  BEHAVIORAL SYMPTOMS/MOOD NEUROLOGICAL BOWEL NUTRITION STATUS      Continent    AMBULATORY STATUS COMMUNICATION OF NEEDS Skin   Limited Assist Verbally Normal                       Personal Care Assistance Level of Assistance  Bathing, Dressing Bathing Assistance: Limited assistance   Dressing Assistance: Limited assistance     Functional Limitations Info             SPECIAL CARE FACTORS FREQUENCY  PT (By licensed PT), OT (By licensed OT)     PT Frequency: 5 OT Frequency: 5            Contractures      Additional Factors Info  Code Status (Full Code)               Current Medications (12/30/2015):  This is the current hospital active medication  list Current Facility-Administered Medications  Medication Dose Route Frequency Provider Last Rate Last Dose  . 0.9 %  sodium chloride infusion   Intravenous Continuous Starleen Arms, MD 75 mL/hr at 12/29/15 2017    . acetaminophen (TYLENOL) tablet 650 mg  650 mg Oral Q6H PRN Starleen Arms, MD       Or  . acetaminophen (TYLENOL) suppository 650 mg  650 mg Rectal Q6H PRN Starleen Arms, MD      . antiseptic oral rinse (CPC / CETYLPYRIDINIUM CHLORIDE 0.05%) solution 7 mL  7 mL Mouth Rinse BID Starleen Arms, MD   7 mL at 12/30/15 1000  . aspirin chewable tablet 81 mg  81 mg Oral Daily Starleen Arms, MD   81 mg at 12/30/15 0945  . brinzolamide (AZOPT) 1 % ophthalmic suspension 1 drop  1 drop Both Eyes TID Starleen Arms, MD   1 drop at 12/30/15 0945   And  . brimonidine (ALPHAGAN) 0.2 % ophthalmic solution 1 drop  1 drop Both Eyes TID Starleen Arms, MD   1 drop at 12/30/15 0945  . calcium-vitamin D (OSCAL WITH D) 500-200 MG-UNIT per tablet 1 tablet  1 tablet Oral Daily Starleen Arms, MD  1 tablet at 12/30/15 0945  . famotidine (PEPCID) tablet 10 mg  10 mg Oral BID Starleen Arms, MD   10 mg at 12/30/15 0945  . heparin injection 5,000 Units  5,000 Units Subcutaneous 3 times per day Starleen Arms, MD   5,000 Units at 12/30/15 0654  . latanoprost (XALATAN) 0.005 % ophthalmic solution 1 drop  1 drop Both Eyes QHS Starleen Arms, MD   1 drop at 12/29/15 2200  . loperamide (IMODIUM) capsule 2 mg  2 mg Oral Q4H PRN Erick Blinks, MD      . multivitamin-lutein (OCUVITE-LUTEIN) capsule 1 capsule  1 capsule Oral BID Starleen Arms, MD   1 capsule at 12/30/15 0946  . ondansetron (ZOFRAN) 4 MG/5ML solution 4 mg  4 mg Oral Q6H PRN Leana Roe Elgergawy, MD      . polyvinyl alcohol (LIQUIFILM TEARS) 1.4 % ophthalmic solution 1 drop  1 drop Both Eyes TID PRN Starleen Arms, MD      . sertraline (ZOLOFT) tablet 100 mg  100 mg Oral Daily Starleen Arms, MD    100 mg at 12/30/15 0945  . silver sulfADIAZINE (SILVADENE) 1 % cream 1 application  1 application Topical BID Starleen Arms, MD   1 application at 12/30/15 0945  . simvastatin (ZOCOR) tablet 10 mg  10 mg Oral QHS Starleen Arms, MD   10 mg at 12/30/15 0018     Discharge Medications: Please see discharge summary for a list of discharge medications.  Relevant Imaging Results:  Relevant Lab Results:   Additional Information SSN [id number]  Liliana Cline, LCSW

## 2015-12-30 NOTE — Progress Notes (Signed)
CSW rec'd referral for ?SNF- CSW met with patient who is declining SNF at this time. She reports that she plans to return home with her husband and adult children who can assist. "I almost had a nervous breakdown at [facility]". Patient is open to HH/DME needs and RNCM has been updated on her plans/wishes for dc to home.  CSW will sign off at this time-   Eduard Clos, MSW, [place]

## 2015-12-30 NOTE — Progress Notes (Signed)
Blaine.Garnet MD notified of EKG 12 lead results NSR with nonspecific ST abnormality.

## 2015-12-31 DIAGNOSIS — G9340 Encephalopathy, unspecified: Secondary | ICD-10-CM | POA: Diagnosis not present

## 2015-12-31 DIAGNOSIS — K529 Noninfective gastroenteritis and colitis, unspecified: Secondary | ICD-10-CM | POA: Diagnosis not present

## 2015-12-31 DIAGNOSIS — I10 Essential (primary) hypertension: Secondary | ICD-10-CM | POA: Diagnosis not present

## 2015-12-31 DIAGNOSIS — E785 Hyperlipidemia, unspecified: Secondary | ICD-10-CM | POA: Diagnosis not present

## 2015-12-31 LAB — COMPREHENSIVE METABOLIC PANEL
ALBUMIN: 2.7 g/dL — AB (ref 3.5–5.0)
ALT: 20 U/L (ref 14–54)
ANION GAP: 7 (ref 5–15)
AST: 24 U/L (ref 15–41)
Alkaline Phosphatase: 78 U/L (ref 38–126)
BUN: 22 mg/dL — ABNORMAL HIGH (ref 6–20)
CALCIUM: 8.2 mg/dL — AB (ref 8.9–10.3)
CHLORIDE: 105 mmol/L (ref 101–111)
CO2: 24 mmol/L (ref 22–32)
CREATININE: 0.9 mg/dL (ref 0.44–1.00)
GFR, EST NON AFRICAN AMERICAN: 59 mL/min — AB (ref >60)
Glucose, Bld: 107 mg/dL — ABNORMAL HIGH (ref 65–99)
Potassium: 3.2 mmol/L — ABNORMAL LOW (ref 3.5–5.1)
SODIUM: 136 mmol/L (ref 135–145)
Total Bilirubin: 1 mg/dL (ref 0.3–1.2)
Total Protein: 6 g/dL — ABNORMAL LOW (ref 6.5–8.1)

## 2015-12-31 LAB — BLOOD GAS, ARTERIAL
ACID-BASE DEFICIT: 3.4 mmol/L — AB (ref 0.0–2.0)
Bicarbonate: 22.2 mEq/L (ref 20.0–24.0)
Drawn by: 234301
FIO2: 21
O2 Saturation: 94.4 %
PCO2 ART: 26.8 mmHg — AB (ref 35.0–45.0)
PH ART: 7.477 — AB (ref 7.350–7.450)
PO2 ART: 69.1 mmHg — AB (ref 80.0–100.0)

## 2015-12-31 LAB — CBC
HCT: 34.1 % — ABNORMAL LOW (ref 36.0–46.0)
HEMOGLOBIN: 11.4 g/dL — AB (ref 12.0–15.0)
MCH: 30.6 pg (ref 26.0–34.0)
MCHC: 33.4 g/dL (ref 30.0–36.0)
MCV: 91.4 fL (ref 78.0–100.0)
PLATELETS: 124 10*3/uL — AB (ref 150–400)
RBC: 3.73 MIL/uL — AB (ref 3.87–5.11)
RDW: 13.1 % (ref 11.5–15.5)
WBC: 8.8 10*3/uL (ref 4.0–10.5)

## 2015-12-31 LAB — TROPONIN I: Troponin I: 0.03 ng/mL (ref <0.031)

## 2015-12-31 LAB — AMMONIA: Ammonia: 14 umol/L (ref 9–35)

## 2015-12-31 MED ORDER — POTASSIUM CHLORIDE CRYS ER 20 MEQ PO TBCR
40.0000 meq | EXTENDED_RELEASE_TABLET | ORAL | Status: AC
  Administered 2015-12-31 (×2): 40 meq via ORAL
  Filled 2015-12-31: qty 2

## 2015-12-31 MED ORDER — HALOPERIDOL LACTATE 5 MG/ML IJ SOLN
2.0000 mg | Freq: Four times a day (QID) | INTRAMUSCULAR | Status: DC | PRN
  Filled 2015-12-31: qty 1

## 2015-12-31 NOTE — Progress Notes (Signed)
Upon assessment earlier this morning, patient alert and oriented x 4.  More recently, patient has become confused.  Now can not answer where she is at, making statements that don't make sense, and attempting to get OOB.  MD made aware.  Will continue to monitor.

## 2015-12-31 NOTE — Progress Notes (Signed)
[facility] PROGRESS NOTE  Cassandra [name] ZOX:[id number] DOB: 1933-01-26 DOA: 12/29/2015 PCP: Colette Ribas, MD  Assessment/Plan: 1. Nausea/Vomiting/diarrhea/abd pain. Likely related to viral gastroenteritis. Abd/pelvis CT negative for acute findings. Continue symptomatic management and IVF. Advance diet as tolerated. WBC is now wnl, patient had a fever overnight. UA is negative.  2. Acute encephalopathy, she does not have any focal neurological deficits. Possibly related to dehydration. Will check ammonia, ABG, and blood cultures. UA does not show any signs of infection. Continue IVF.  3. Elevated troponin. Resolved. Denies any chest pain at this time. May be demand ischemia in the setting of dehydration.   4. Dehydration, improving with IVF. 5. Hypokalemia, will replete. 6. Chronic left foot wound, does not appear to be actively infected. Follow up with podiatry.  7. Weakness, likely related to # 1, PT has consulted and recommended SNF upon discharge  8. HTN, BP stable 9. HLD, continue statin 10. Glaucoma, continue home management  Code Status: Full DVT prophylaxis: Heparin Family Communication: Daughter at bedside. Disposition Plan: Anticipate discharge within 3-4 days   Consultants:  PT - SNF  Procedures:  none  Antibiotics:  none  HPI/Subjective: Stomach feels better. Not having any diarrhea or significant pain.   Per nurse, patient has been drinking well but not eating much.  Daughter reports that the patient lives alone and is typically very independent. This morning, the patient thought that she was home and has been confused all day.    Objective: Filed Vitals:   12/30/15 2230 12/31/15 0421  BP: 125/61 131/68  Pulse: 78 77  Temp: 99 F (37.2 C) 98.8 F (37.1 C)  Resp: 18 18    Intake/Output Summary (Last 24 hours) at 12/31/15 0806 Last data filed at 12/30/15 1730  Gross per 24 hour  Intake    600 ml  Output      0 ml  Net    600 ml    Filed Weights   12/29/15 0759 12/29/15 1957  Weight: 80.74 kg (178 lb) 81.2 kg (179 lb 0.2 oz)    Exam:  General: NAD, looks comfortable Cardiovascular: RRR, S1, S2  Respiratory: clear bilaterally, No wheezing, rales or rhonchi Abdomen: soft, non tender, no distention , bowel sounds normal Musculoskeletal: No edema b/l. S/p Left great toe amputation. Wound does not appear to be infected.  Neurological: Not oriented to location or month. No focal motor deficits. No facial asymmetry.    Data Reviewed: Basic Metabolic Panel:  Recent Labs Lab 12/29/15 0806 12/30/15 0501 12/31/15 0546  NA 137 135 136  K 3.9 3.4* 3.2*  CL 101 105 105  CO2 GLUCOSE 141* 108* 107*  BUN 21* 19 22*  CREATININE 1.06* 1.09* 0.90  CALCIUM 9.4 8.1* 8.2*   Liver Function Tests:  Recent Labs Lab 12/29/15 0806 12/30/15 0501 12/31/15 0546  AST ALT ALKPHOS 79 71 78  BILITOT 1.5* 0.7 1.0  PROT 7.2 6.0* 6.0*  ALBUMIN 3.8 3.0* 2.7*    Recent Labs Lab 12/29/15 0806  LIPASE 15   CBC:  Recent Labs Lab 12/29/15 0806 12/30/15 0501 12/31/15 0546  WBC 19.2* 10.2 8.8  HGB 13.2 12.0 11.4*  HCT 39.5 36.0 34.1*  MCV 91.6 92.1 91.4  PLT 191 133* 124*   Cardiac Enzymes:  Recent Labs Lab 12/30/15 0501 12/30/15 1218 12/30/15 1836 12/31/15 0059  TROPONINI 0.04* 0.04* 0.04* 0.03     Recent Results (from the  past 240 hour(s))  C difficile quick scan w PCR reflex     Status: None   Collection Time: 12/30/15 12:28 AM  Result Value Ref Range Status   C Diff antigen NEGATIVE NEGATIVE Final   C Diff toxin NEGATIVE NEGATIVE Final   C Diff interpretation Negative for toxigenic C. difficile  Final    Comment: NEGATIVE     Studies: Dg Chest 2 View  12/29/2015  CLINICAL DATA:  RIGHT flank pain beginning yesterday, nausea, vomiting, hypertension EXAM: CHEST  2 VIEW COMPARISON:  05/22/2013 FINDINGS: Borderline enlargement of cardiac silhouette Atherosclerotic  calcification aorta. Mediastinal contours stable. Minimal pulmonary vascular congestion. Accentuation of interstitial markings in the perihilar regions versus previous exam, cannot exclude minimal edema. No gross pleural effusion or pneumothorax. Bones unremarkable. IMPRESSION: Accentuated interstitial markings versus previous exam particularly in perihilar regions question minimal edema. Electronically Signed   By: Ulyses Southward M.D.   On: 12/29/2015 17:31   Ct Abdomen Pelvis W Contrast  12/29/2015  CLINICAL DATA:  Right flank pain since January 25th. Negative renal stone protocol CT earlier today. Additional history of hypertension, hiatal hernia and cholecystectomy. EXAM: CT ABDOMEN AND PELVIS WITH CONTRAST TECHNIQUE: Multidetector CT imaging of the abdomen and pelvis was performed using the standard protocol following bolus administration of intravenous contrast. CONTRAST:  OMNIPAQUE IOHEXOL 300 MG/ML  SOLN COMPARISON:  Noncontrast abdomen and pelvis CT dated 12/29/2015. Comparison also made to CT abdomen pelvis dated 05/22/2013. FINDINGS: Lower chest: Mild dependent atelectasis at each lung base. Otherwise unremarkable. Hepatobiliary: Liver appears normal. Patient is status post cholecystectomy with associated mild bile duct ectasia. Pancreas: No mass, inflammatory changes, or other significant abnormality. Spleen: Upper normal in size. Adrenals/Urinary Tract: Adrenal glands are unremarkable. Questionable small stable nodules within each adrenal gland, of benign etiology. Kidneys appear normal without stone or hydronephrosis. No ureteral or bladder calculi identified. Bladder appears normal. Stomach/Bowel: Bowel is normal in caliber. No bowel wall thickening or evidence of bowel wall inflammation. Appendix is normal. Vascular/Lymphatic: Scattered mild atherosclerotic changes of the normal- caliber abdominal aorta. No acute vascular abnormality seen. No enlarged lymph nodes within the abdomen or pelvis.  Reproductive: Unremarkable. Small amount of free fluid in the pelvis is likely physiologic in nature. Other: No significant free fluid. No abscess collection. No free intraperitoneal air. Musculoskeletal: Degenerative changes within the lumbar spine, stable in appearance compared to the previous CT of 05/22/2013. No acute- appearing osseous abnormality. 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 Superficial soft tissues are unremarkable. IMPRESSION: 1. No source for right flank pain identified, unless related to the chronic degenerative changes in the lumbar spine. No evidence of acute intra-abdominal or intrapelvic abnormality. 2. Additional incidental/chronic findings detailed above. Electronically Signed   By: Bary Richard M.D.   On: 12/29/2015 17:52   US Arterial Seg Multiple  12/30/2015  CLINICAL DATA:  82 year old female with left foot cellulitis and constant bilateral lower extremity leg pain for the last 5 days EXAM: NONINVASIVE PHYSIOLOGIC VASCULAR STUDY OF BILATERAL LOWER EXTREMITIES TECHNIQUE: Evaluation of both lower extremities was performed at rest, including calculation of ankle-brachial indices, multiple segmental pressure evaluation, segmental Doppler and segmental pulse volume recording. COMPARISON:  None. FINDINGS: Right ABI:  1.2 Left ABI:  1.2 Right Lower Extremity: Normal triphasic arterial waveforms at the ankle. Limited utility of segmental blood pressures secondary to poor compressibility of the vessels. Significantly decreased arterial waveforms in the right second through fifth digits. Left Lower Extremity: Normal  triphasic arterial waveforms at the ankle. Limited utility of segmental blood pressures secondary to poor compressibility of the vessels. Decreased amplitude of the arterial waveforms in the second through fifth digits. IMPRESSION: 1. Normal bilateral resting ankle-brachial indices and arterial waveforms at the ankles.  No evidence of clinically significant large vessel peripheral arterial disease. 2. Abnormal digital waveforms bilaterally consistent with small vessel disease. Signed, Sterling Big, MD [facility] Electronically Signed   By: Malachy Moan M.D.   On: 12/30/2015 15:38   Ct Renal Stone Study  12/29/2015  CLINICAL DATA:  Right flank pain starting yesterday. Nausea vomiting. EXAM: CT ABDOMEN AND PELVIS WITHOUT CONTRAST TECHNIQUE: Multidetector CT imaging of the abdomen and pelvis was performed following the standard protocol without IV contrast. COMPARISON:  None. FINDINGS: Lower chest:  Lung bases are clear. Hepatobiliary: Normal liver.  Prior cholecystectomy. Pancreas: Normal. Spleen: Normal. Adrenals/Urinary Tract: Normal adrenal glands. Normal kidneys. Normal bladder. No urolithiasis or obstructive uropathy. Incidental note is made if a right extrarenal pelvis. Stomach/Bowel: No bowel wall thickening or dilatation. No pneumatosis, pneumoperitoneum or portal venous gas. No abdominal or pelvic free fluid. Vascular/Lymphatic: Normal caliber abdominal aorta. Mild abdominal aortic atherosclerosis. No lymphadenopathy. Reproductive: Normal uterus.  No adnexal mass. Other: No fluid collection or hematoma. Fat containing right inguinal hernia. Musculoskeletal: No lytic or sclerotic osseous lesion. Degenerative disc disease and facet arthropathy throughout the lumbar spine most severe at L4-5. IMPRESSION: 1. No urolithiasis or obstructive uropathy. Electronically Signed   By: Elige Ko   On: 12/29/2015 08:57    Scheduled Meds: . antiseptic oral rinse  7 mL Mouth Rinse BID  . aspirin  81 mg Oral Daily  . brinzolamide  1 drop Both Eyes TID   And  . brimonidine  1 drop Both Eyes TID  . calcium-vitamin D  1 tablet Oral Daily  . famotidine  10 mg Oral BID  . heparin  5,000 Units Subcutaneous 3 times per day  . latanoprost  1 drop Both Eyes QHS   . multivitamin-lutein  1 capsule Oral BID  . sertraline  100 mg Oral Daily  . silver sulfADIAZINE  1 application Topical BID  . simvastatin  10 mg Oral QHS   Continuous Infusions: . sodium chloride 75 mL/hr at 12/31/15 0057    Active Problems:   Hypertension   Hyperlipidemia   Gastroenteritis   Glaucoma   Time spent: 25 minutes   Cassandra [name]. MD  [facility] Pager [phone]. If 7PM-7AM, please contact night-coverage at [email or web address], password [facility] 12/31/2015, 8:06 AM      By signing my name below, I, Burnett Harry, attest that this documentation has been prepared under the direction and in the presence of [facility]. MD Electronically Signed: Burnett Harry, Scribe. 12/31/2015 11:38am  I, Dr. Erick Blinks, personally performed the services described in this documentaiton. All medical record entries made by the scribe were at my direction and in my presence. I have reviewed the chart and agree that the record reflects my personal performance and is accurate and complete  Erick Blinks, MD, 12/31/2015 12:06 PM

## 2016-01-01 ENCOUNTER — Observation Stay (HOSPITAL_COMMUNITY): Payer: Medicare Other

## 2016-01-01 ENCOUNTER — Inpatient Hospital Stay (HOSPITAL_COMMUNITY): Payer: Medicare Other

## 2016-01-01 DIAGNOSIS — A499 Bacterial infection, unspecified: Secondary | ICD-10-CM

## 2016-01-01 DIAGNOSIS — N1330 Unspecified hydronephrosis: Secondary | ICD-10-CM | POA: Diagnosis present

## 2016-01-01 DIAGNOSIS — I739 Peripheral vascular disease, unspecified: Secondary | ICD-10-CM | POA: Diagnosis present

## 2016-01-01 DIAGNOSIS — A4101 Sepsis due to Methicillin susceptible Staphylococcus aureus: Secondary | ICD-10-CM | POA: Diagnosis not present

## 2016-01-01 DIAGNOSIS — I63139 Cerebral infarction due to embolism of unspecified carotid artery: Secondary | ICD-10-CM | POA: Diagnosis not present

## 2016-01-01 DIAGNOSIS — I330 Acute and subacute infective endocarditis: Secondary | ICD-10-CM | POA: Diagnosis not present

## 2016-01-01 DIAGNOSIS — M86672 Other chronic osteomyelitis, left ankle and foot: Secondary | ICD-10-CM | POA: Diagnosis not present

## 2016-01-01 DIAGNOSIS — L97521 Non-pressure chronic ulcer of other part of left foot limited to breakdown of skin: Secondary | ICD-10-CM | POA: Diagnosis not present

## 2016-01-01 DIAGNOSIS — Q211 Atrial septal defect: Secondary | ICD-10-CM | POA: Diagnosis not present

## 2016-01-01 DIAGNOSIS — R400 Somnolence: Secondary | ICD-10-CM | POA: Diagnosis not present

## 2016-01-01 DIAGNOSIS — I639 Cerebral infarction, unspecified: Secondary | ICD-10-CM | POA: Diagnosis not present

## 2016-01-01 DIAGNOSIS — R112 Nausea with vomiting, unspecified: Secondary | ICD-10-CM | POA: Diagnosis not present

## 2016-01-01 DIAGNOSIS — I10 Essential (primary) hypertension: Secondary | ICD-10-CM | POA: Diagnosis not present

## 2016-01-01 DIAGNOSIS — Z79899 Other long term (current) drug therapy: Secondary | ICD-10-CM | POA: Diagnosis not present

## 2016-01-01 DIAGNOSIS — F329 Major depressive disorder, single episode, unspecified: Secondary | ICD-10-CM | POA: Diagnosis present

## 2016-01-01 DIAGNOSIS — R079 Chest pain, unspecified: Secondary | ICD-10-CM | POA: Diagnosis not present

## 2016-01-01 DIAGNOSIS — I76 Septic arterial embolism: Secondary | ICD-10-CM | POA: Diagnosis present

## 2016-01-01 DIAGNOSIS — I63419 Cerebral infarction due to embolism of unspecified middle cerebral artery: Secondary | ICD-10-CM | POA: Diagnosis not present

## 2016-01-01 DIAGNOSIS — M6702 Short Achilles tendon (acquired), left ankle: Secondary | ICD-10-CM | POA: Diagnosis not present

## 2016-01-01 DIAGNOSIS — R339 Retention of urine, unspecified: Secondary | ICD-10-CM | POA: Diagnosis not present

## 2016-01-01 DIAGNOSIS — M6281 Muscle weakness (generalized): Secondary | ICD-10-CM | POA: Diagnosis not present

## 2016-01-01 DIAGNOSIS — D62 Acute posthemorrhagic anemia: Secondary | ICD-10-CM | POA: Diagnosis not present

## 2016-01-01 DIAGNOSIS — R109 Unspecified abdominal pain: Secondary | ICD-10-CM | POA: Diagnosis not present

## 2016-01-01 DIAGNOSIS — G9340 Encephalopathy, unspecified: Secondary | ICD-10-CM | POA: Diagnosis not present

## 2016-01-01 DIAGNOSIS — A084 Viral intestinal infection, unspecified: Secondary | ICD-10-CM | POA: Diagnosis present

## 2016-01-01 DIAGNOSIS — I209 Angina pectoris, unspecified: Secondary | ICD-10-CM | POA: Diagnosis not present

## 2016-01-01 DIAGNOSIS — I2690 Septic pulmonary embolism without acute cor pulmonale: Secondary | ICD-10-CM | POA: Diagnosis not present

## 2016-01-01 DIAGNOSIS — E785 Hyperlipidemia, unspecified: Secondary | ICD-10-CM | POA: Diagnosis not present

## 2016-01-01 DIAGNOSIS — B9561 Methicillin susceptible Staphylococcus aureus infection as the cause of diseases classified elsewhere: Secondary | ICD-10-CM | POA: Diagnosis not present

## 2016-01-01 DIAGNOSIS — M89679 Osteopathy after poliomyelitis, unspecified ankle and foot: Secondary | ICD-10-CM | POA: Diagnosis not present

## 2016-01-01 DIAGNOSIS — M25551 Pain in right hip: Secondary | ICD-10-CM | POA: Diagnosis present

## 2016-01-01 DIAGNOSIS — A419 Sepsis, unspecified organism: Secondary | ICD-10-CM | POA: Diagnosis not present

## 2016-01-01 DIAGNOSIS — R0989 Other specified symptoms and signs involving the circulatory and respiratory systems: Secondary | ICD-10-CM | POA: Diagnosis not present

## 2016-01-01 DIAGNOSIS — R7881 Bacteremia: Secondary | ICD-10-CM | POA: Diagnosis present

## 2016-01-01 DIAGNOSIS — E860 Dehydration: Secondary | ICD-10-CM | POA: Diagnosis present

## 2016-01-01 DIAGNOSIS — M86172 Other acute osteomyelitis, left ankle and foot: Secondary | ICD-10-CM | POA: Diagnosis not present

## 2016-01-01 DIAGNOSIS — R4701 Aphasia: Secondary | ICD-10-CM | POA: Diagnosis not present

## 2016-01-01 DIAGNOSIS — K529 Noninfective gastroenteritis and colitis, unspecified: Secondary | ICD-10-CM | POA: Diagnosis not present

## 2016-01-01 DIAGNOSIS — Z7982 Long term (current) use of aspirin: Secondary | ICD-10-CM | POA: Diagnosis not present

## 2016-01-01 DIAGNOSIS — L97511 Non-pressure chronic ulcer of other part of right foot limited to breakdown of skin: Secondary | ICD-10-CM | POA: Diagnosis not present

## 2016-01-01 DIAGNOSIS — H409 Unspecified glaucoma: Secondary | ICD-10-CM | POA: Diagnosis present

## 2016-01-01 DIAGNOSIS — I6350 Cerebral infarction due to unspecified occlusion or stenosis of unspecified cerebral artery: Secondary | ICD-10-CM | POA: Diagnosis not present

## 2016-01-01 DIAGNOSIS — Z89422 Acquired absence of other left toe(s): Secondary | ICD-10-CM | POA: Diagnosis not present

## 2016-01-01 DIAGNOSIS — I6349 Cerebral infarction due to embolism of other cerebral artery: Secondary | ICD-10-CM | POA: Diagnosis not present

## 2016-01-01 DIAGNOSIS — M1990 Unspecified osteoarthritis, unspecified site: Secondary | ICD-10-CM | POA: Diagnosis present

## 2016-01-01 DIAGNOSIS — I514 Myocarditis, unspecified: Secondary | ICD-10-CM | POA: Diagnosis not present

## 2016-01-01 DIAGNOSIS — I340 Nonrheumatic mitral (valve) insufficiency: Secondary | ICD-10-CM | POA: Diagnosis not present

## 2016-01-01 DIAGNOSIS — L97529 Non-pressure chronic ulcer of other part of left foot with unspecified severity: Secondary | ICD-10-CM | POA: Diagnosis present

## 2016-01-01 DIAGNOSIS — D735 Infarction of spleen: Secondary | ICD-10-CM | POA: Diagnosis present

## 2016-01-01 DIAGNOSIS — R748 Abnormal levels of other serum enzymes: Secondary | ICD-10-CM | POA: Diagnosis present

## 2016-01-01 DIAGNOSIS — I6340 Cerebral infarction due to embolism of unspecified cerebral artery: Secondary | ICD-10-CM | POA: Diagnosis not present

## 2016-01-01 DIAGNOSIS — G629 Polyneuropathy, unspecified: Secondary | ICD-10-CM | POA: Diagnosis not present

## 2016-01-01 DIAGNOSIS — I339 Acute and subacute endocarditis, unspecified: Secondary | ICD-10-CM | POA: Diagnosis not present

## 2016-01-01 DIAGNOSIS — M86072 Acute hematogenous osteomyelitis, left ankle and foot: Secondary | ICD-10-CM | POA: Diagnosis not present

## 2016-01-01 DIAGNOSIS — M869 Osteomyelitis, unspecified: Secondary | ICD-10-CM | POA: Diagnosis not present

## 2016-01-01 DIAGNOSIS — E876 Hypokalemia: Secondary | ICD-10-CM | POA: Diagnosis present

## 2016-01-01 LAB — BASIC METABOLIC PANEL
ANION GAP: 10 (ref 5–15)
BUN: 24 mg/dL — ABNORMAL HIGH (ref 6–20)
CALCIUM: 8.6 mg/dL — AB (ref 8.9–10.3)
CO2: 20 mmol/L — ABNORMAL LOW (ref 22–32)
Chloride: 104 mmol/L (ref 101–111)
Creatinine, Ser: 0.84 mg/dL (ref 0.44–1.00)
Glucose, Bld: 114 mg/dL — ABNORMAL HIGH (ref 65–99)
POTASSIUM: 3.5 mmol/L (ref 3.5–5.1)
Sodium: 134 mmol/L — ABNORMAL LOW (ref 135–145)

## 2016-01-01 LAB — BRAIN NATRIURETIC PEPTIDE: B NATRIURETIC PEPTIDE 5: 432 pg/mL — AB (ref 0.0–100.0)

## 2016-01-01 MED ORDER — VANCOMYCIN HCL IN DEXTROSE 750-5 MG/150ML-% IV SOLN
750.0000 mg | Freq: Two times a day (BID) | INTRAVENOUS | Status: DC
  Administered 2016-01-01 – 2016-01-03 (×4): 750 mg via INTRAVENOUS
  Filled 2016-01-01 (×6): qty 150

## 2016-01-01 MED ORDER — CEFAZOLIN SODIUM-DEXTROSE 2-3 GM-% IV SOLR
2.0000 g | Freq: Three times a day (TID) | INTRAVENOUS | Status: DC
  Administered 2016-01-01 – 2016-01-03 (×7): 2 g via INTRAVENOUS
  Filled 2016-01-01 (×10): qty 50

## 2016-01-01 MED ORDER — VANCOMYCIN HCL 10 G IV SOLR
1500.0000 mg | Freq: Once | INTRAVENOUS | Status: AC
  Administered 2016-01-01: 1500 mg via INTRAVENOUS
  Filled 2016-01-01: qty 1500

## 2016-01-01 MED ORDER — FUROSEMIDE 10 MG/ML IJ SOLN
40.0000 mg | Freq: Once | INTRAMUSCULAR | Status: AC
  Administered 2016-01-01: 40 mg via INTRAVENOUS
  Filled 2016-01-01: qty 4

## 2016-01-01 NOTE — Progress Notes (Signed)
CM contacted [facility] spoke with representative Misty Stanley faxed demographics, orders, and H&P for review.  Patient is in their system from receiving a wheelchair.  Patient pending discharge from hospital.

## 2016-01-01 NOTE — Progress Notes (Signed)
ANTIBIOTIC CONSULT NOTE - INITIAL  Pharmacy Consult for Vancomycin and Ancef (pending cx results) Indication: bacteremia  Allergies  Allergen Reactions  . Codeine Shortness Of Breath and Nausea And Vomiting  . Hydrocodone Shortness Of Breath and Nausea And Vomiting   Patient Measurements: Height:  (175.3 cm) Weight: 179 lb 0.2 oz (81.2 kg) IBW/kg (Calculated) : 66.2  Vital Signs: Temp: 98.6 F (37 C) (01/29 0500) Temp Source: Oral (01/29 0500) BP: 136/67 mmHg (01/29 0500) Pulse Rate: 76 (01/29 0500) Intake/Output from previous day: 01/28 0701 - 01/29 0700 In: 720 [P.O.:720] Out: -  Intake/Output from this shift: Total I/O In: 240 [P.O.:240] Out: -   Labs:  Recent Labs  12/30/15 0501 12/31/15 0546  WBC 10.2 8.8  HGB 12.0 11.4*  PLT 133* 124*  CREATININE 1.09* 0.90   Estimated Creatinine Clearance: 56.8 mL/min (by C-G formula based on Cr of 0.9). No results for input(s): VANCOTROUGH, VANCOPEAK, VANCORANDOM, GENTTROUGH, GENTPEAK, GENTRANDOM, TOBRATROUGH, TOBRAPEAK, TOBRARND, AMIKACINPEAK, AMIKACINTROU, AMIKACIN in the last 72 hours.   Microbiology: Recent Results (from the past 720 hour(s))  C difficile quick scan w PCR reflex     Status: None   Collection Time: 12/30/15 12:28 AM  Result Value Ref Range Status   C Diff antigen NEGATIVE NEGATIVE Final   C Diff toxin NEGATIVE NEGATIVE Final   C Diff interpretation Negative for toxigenic C. difficile  Final    Comment: NEGATIVE  Culture, blood (routine x 2)     Status: None (Preliminary result)   Collection Time: 12/31/15 12:16 PM  Result Value Ref Range Status   Specimen Description BLOOD RIGHT ARM  Final   Special Requests BOTTLES DRAWN AEROBIC AND ANAEROBIC 6CC  Final   Culture  Setup Time   Final    GRAM POSITIVE COCCI IN CLUSTERS Gram Stain Report Called to,Read Back By and Verified With: LAVINDER, J. AT 0035 ON 01/01/2016 BY AGUNDIZ,E. BOTH BOTTLES    Culture PENDING  Incomplete   Report Status  PENDING  Incomplete  Culture, blood (routine x 2)     Status: None (Preliminary result)   Collection Time: 12/31/15 12:33 PM  Result Value Ref Range Status   Specimen Description BLOOD RIGHT ARM  Final   Special Requests BOTTLES DRAWN AEROBIC AND ANAEROBIC 6CC  Final   Culture  Setup Time   Final    GRAM POSITIVE COCCI IN CLUSTERS Gram Stain Report Called to,Read Back By and Verified With: LAVINDER,J. AT 0035 ON 01/01/2016 BY AGUNDIZ,E. BOTH BOTTLES   Culture PENDING  Incomplete   Report Status PENDING  Incomplete    Medical History: Past Medical History  Diagnosis Date  . Hypertension   . Arthritis   . Depression   . Hyperlipidemia   . PFO (patent foramen ovale)     small  . H/O echocardiogram 2008    prolapse of post. mitral leaflet, tr MR, tr TR, EF >55%, mild concentric LVH  . H/O cardiovascular stress test 10/13/2007    low risk scan, no changes from previous test  . Heart palpitations     controlled on BB  . Hiatal hernia   . Cellulitis and abscess of toe of left foot 09/16/2014   Anti-infectives    Start     Dose/Rate Route Frequency Ordered Stop   01/01/16 2300  vancomycin (VANCOCIN) IVPB 750 mg/150 ml premix     750 mg 150 mL/hr over 60 Minutes Intravenous Every 12 hours 01/01/16 1042     01/01/16 1400  ceFAZolin (ANCEF) IVPB 2 g/50 mL premix     2 g 100 mL/hr over 30 Minutes Intravenous 3 times per day 01/01/16 1041     01/01/16 1200  vancomycin (VANCOCIN) 1,500 mg in sodium chloride 0.9 % 500 mL IVPB     1,500 mg 250 mL/hr over 120 Minutes Intravenous  Once 01/01/16 1041       Assessment: 1. Gram positive bacteremia, revealed on 2/2 blood cultures. Sensitives pending although suspect this is staphaureus. Started on Vancomycin and Cefazolin. Will order ECHO for further evaluation of infection. She will likely need a prolong course of abx. Source of bacteremia not entirely clear at this time.  Goal of Therapy:  Vancomycin trough level 15-20 mcg/ml  Plan:   Vancomycin  IV now x 1 then  IV q12hrs Ancef 2gm IV q8hrs F/U cultures - ID and sensitivity, narrow ABX as appropriate Monitor labs, renal fxn, progress  Margo Aye, Mende Biswell A 01/01/2016,11:36 AM

## 2016-01-01 NOTE — Progress Notes (Signed)
Lab called with a critical on patient. Blood cultures showed Gram Positive Cocci. MD paged and made aware.

## 2016-01-01 NOTE — Progress Notes (Signed)
[facility] PROGRESS NOTE  Cassandra [name] UJW:[id number] DOB: 20-Nov-1933 DOA: 12/29/2015 PCP: Colette Ribas, MD  Assessment/Plan: 1. Nausea/Vomiting/diarrhea/abd pain. Improving. Pain and vomiting resolved with mild diarrhea. Likely related to viral gastroenteritis. Abd/pelvis CT negative for acute findings. Continue symptomatic management and IVF. Tolerating diet. WBC is now wnl. UA is negative. Afebrile for 24 hours.  2. Gram positive bacteremia, revealed on 2/2 blood cultures. Sensitives pending although suspect this is staphaureus. Started on Vancomycin and Cefazolin. Will order ECHO for further evaluation of infection. She will likely need a prolong course of abx. Source of bacteremia not entirely clear at this time.  3. Acute encephalopathy, improving. She does not have any focal neurological deficits likely related to fever yesterday. Mental status appears to have improved today.  4. Elevated troponin. Resolved. Denies any chest pain at this time. May be demand ischemia in the setting of dehydration.   5. Dehydration, improving with IVF. 6. Hypokalemia, replaced 7. Chronic left foot wound, does not appear to be actively infected. Follow up with podiatry. AVI shows adequate blood flow bilaterally.  8. Weakness, likely related to # 1, PT has consulted and recommended SNF upon discharge  9. HTN, BP stable 10. HLD, continue statin 11. Glaucoma, continue home management 12. Dyspnea, pt reports increased SOB overnight. Check CXR.   Code Status: Full DVT prophylaxis: Heparin Family Communication: Daughter and family at bedside. Disposition Plan: Anticipate discharge to SNF within 3-4 days   Consultants:  PT - SNF  Procedures:  none  Antibiotics:  Vancomycin 1/29>>  Cefazolin 1/29>>  HPI/Subjective: Stomach is fine. Denies vomiting. Mild diarrhea and fatigue. Was able to eat breakfast without difficulty. SOB overnight.   Family bedside reports confusion since  admission has improved, but increased fatigue since hospitalization   Objective: Filed Vitals:   12/31/15 2049 01/01/16 0500  BP: 137/66 136/67  Pulse: 84 76  Temp: 99.8 F (37.7 C) 98.6 F (37 C)  Resp: 19 22    Intake/Output Summary (Last 24 hours) at 01/01/16 0808 Last data filed at 12/31/15 1730  Gross per 24 hour  Intake    720 ml  Output      0 ml  Net    720 ml   Filed Weights   12/29/15 0759 12/29/15 1957  Weight: 80.74 kg (178 lb) 81.2 kg (179 lb 0.2 oz)    Exam: General: NAD. Appears ill but not toxic.  Cardiovascular: RRR, S1, S2  Respiratory: CTAB. Mild increased respiratory efforts.  Abdomen: soft, non tender, no distention , bowel sounds normal Musculoskeletal: No edema b/l   Data Reviewed: Basic Metabolic Panel:  Recent Labs Lab 12/29/15 0806 12/30/15 0501 12/31/15 0546  NA 137 135 136  K 3.9 3.4* 3.2*  CL 101 105 105  CO2 GLUCOSE 141* 108* 107*  BUN 21* 19 22*  CREATININE 1.06* 1.09* 0.90  CALCIUM 9.4 8.1* 8.2*   Liver Function Tests:  Recent Labs Lab 12/29/15 0806 12/30/15 0501 12/31/15 0546  AST ALT ALKPHOS 79 71 78  BILITOT 1.5* 0.7 1.0  PROT 7.2 6.0* 6.0*  ALBUMIN 3.8 3.0* 2.7*    Recent Labs Lab 12/29/15 0806  LIPASE 15   CBC:  Recent Labs Lab 12/29/15 0806 12/30/15 0501 12/31/15 0546  WBC 19.2* 10.2 8.8  HGB 13.2 12.0 11.4*  HCT 39.5 36.0 34.1*  MCV 91.6 92.1 91.4  PLT 191 133* 124*   Cardiac Enzymes:  Recent Labs  Lab 12/30/15 0501 12/30/15 1218 12/30/15 1836 12/31/15 0059  TROPONINI 0.04* 0.04* 0.04* 0.03     Recent Results (from the past 240 hour(s))  C difficile quick scan w PCR reflex     Status: None   Collection Time: 12/30/15 12:28 AM  Result Value Ref Range Status   C Diff antigen NEGATIVE NEGATIVE Final   C Diff toxin NEGATIVE NEGATIVE Final   C Diff interpretation Negative for toxigenic C. difficile  Final    Comment: NEGATIVE  Culture, blood (routine x  2)     Status: None (Preliminary result)   Collection Time: 12/31/15 12:16 PM  Result Value Ref Range Status   Specimen Description BLOOD RIGHT ARM  Final   Special Requests BOTTLES DRAWN AEROBIC AND ANAEROBIC 6CC  Final   Culture  Setup Time   Final    GRAM POSITIVE COCCI IN CLUSTERS Gram Stain Report Called to,Read Back By and Verified With: LAVINDER, J. AT 0035 ON 01/01/2016 BY AGUNDIZ,E. BOTH BOTTLES    Culture PENDING  Incomplete   Report Status PENDING  Incomplete  Culture, blood (routine x 2)     Status: None (Preliminary result)   Collection Time: 12/31/15 12:33 PM  Result Value Ref Range Status   Specimen Description BLOOD RIGHT ARM  Final   Special Requests BOTTLES DRAWN AEROBIC AND ANAEROBIC 6CC  Final   Culture  Setup Time   Final    GRAM POSITIVE COCCI IN CLUSTERS Gram Stain Report Called to,Read Back By and Verified With: LAVINDER,J. AT 0035 ON 01/01/2016 BY AGUNDIZ,E. BOTH BOTTLES   Culture PENDING  Incomplete   Report Status PENDING  Incomplete     Studies: US Arterial Seg Multiple  12/30/2015  CLINICAL DATA:  82 year old female with left foot cellulitis and constant bilateral lower extremity leg pain for the last 5 days EXAM: NONINVASIVE PHYSIOLOGIC VASCULAR STUDY OF BILATERAL LOWER EXTREMITIES TECHNIQUE: Evaluation of both lower extremities was performed at rest, including calculation of ankle-brachial indices, multiple segmental pressure evaluation, segmental Doppler and segmental pulse volume recording. COMPARISON:  None. FINDINGS: Right ABI:  1.2 Left ABI:  1.2 Right Lower Extremity: Normal triphasic arterial waveforms at the ankle. Limited utility of segmental blood pressures secondary to poor compressibility of the vessels. Significantly decreased arterial waveforms in the right second through fifth digits. Left Lower Extremity: Normal triphasic arterial waveforms at the ankle. Limited utility of segmental blood pressures secondary to poor compressibility of the vessels.  Decreased amplitude of the arterial waveforms in the second through fifth digits. IMPRESSION: 1. Normal bilateral resting ankle-brachial indices and arterial waveforms at the ankles. No evidence of clinically significant large vessel peripheral arterial disease. 2. Abnormal digital waveforms bilaterally consistent with small vessel disease. Signed, Sterling Big, MD [facility] Electronically Signed   By: Malachy Moan M.D.   On: 12/30/2015 15:38    Scheduled Meds: . antiseptic oral rinse  7 mL Mouth Rinse BID  . aspirin  81 mg Oral Daily  . brinzolamide  1 drop Both Eyes TID   And  . brimonidine  1 drop Both Eyes TID  . calcium-vitamin D  1 tablet Oral Daily  . famotidine  10 mg Oral BID  . heparin  5,000 Units Subcutaneous 3 times per day  . latanoprost  1 drop Both Eyes QHS  . multivitamin-lutein  1 capsule Oral BID  . sertraline  100 mg Oral Daily  . silver sulfADIAZINE  1 application Topical BID  .  simvastatin  10 mg Oral QHS   Continuous Infusions: . sodium chloride 75 mL/hr at 01/01/16 0515    Active Problems:   Hypertension   Hyperlipidemia   Gastroenteritis   Glaucoma   Time spent: 25 minutes   Hanley Rispoli. MD  [facility] Pager [phone]. If 7PM-7AM, please contact night-coverage at [email or web address], password [facility] 01/01/2016, 8:08 AM     By signing my name below, I, Zadie Cleverly, attest that this documentation has been prepared under the direction and in the presence of Erick Blinks, MD. Electronically signed: Zadie Cleverly, Scribe. 01/01/2016 10:35am   I, Dr. Erick Blinks, personally performed the services described in this documentaiton. All medical record entries made by the scribe were at my direction and in my presence. I have reviewed the chart and agree that the record reflects my personal performance and is accurate and complete  Erick Blinks, MD, 01/01/2016 11:00  AM

## 2016-01-01 NOTE — Progress Notes (Signed)
  Echocardiogram 2D Echocardiogram has been performed.  Cassandra [name] 01/01/2016, 2:22 PM

## 2016-01-02 ENCOUNTER — Ambulatory Visit (HOSPITAL_COMMUNITY): Admission: RE | Admit: 2016-01-02 | Payer: Medicare Other | Source: Ambulatory Visit

## 2016-01-02 ENCOUNTER — Inpatient Hospital Stay (HOSPITAL_COMMUNITY): Payer: Medicare Other

## 2016-01-02 LAB — BASIC METABOLIC PANEL
ANION GAP: 10 (ref 5–15)
BUN: 30 mg/dL — ABNORMAL HIGH (ref 6–20)
CALCIUM: 8.5 mg/dL — AB (ref 8.9–10.3)
CHLORIDE: 104 mmol/L (ref 101–111)
CO2: 22 mmol/L (ref 22–32)
CREATININE: 0.99 mg/dL (ref 0.44–1.00)
GFR calc Af Amer: >60 mL/min (ref >60)
GFR calc non Af Amer: 52 mL/min — ABNORMAL LOW (ref >60)
GLUCOSE: 114 mg/dL — AB (ref 65–99)
Potassium: 2.7 mmol/L — CL (ref 3.5–5.1)
Sodium: 136 mmol/L (ref 135–145)

## 2016-01-02 LAB — CBC
HCT: 34.9 % — ABNORMAL LOW (ref 36.0–46.0)
HEMOGLOBIN: 11.9 g/dL — AB (ref 12.0–15.0)
MCH: 30.6 pg (ref 26.0–34.0)
MCHC: 34.1 g/dL (ref 30.0–36.0)
MCV: 89.7 fL (ref 78.0–100.0)
Platelets: 159 10*3/uL (ref 150–400)
RBC: 3.89 MIL/uL (ref 3.87–5.11)
RDW: 13.6 % (ref 11.5–15.5)
WBC: 13.9 10*3/uL — ABNORMAL HIGH (ref 4.0–10.5)

## 2016-01-02 MED ORDER — DIPHENHYDRAMINE HCL 12.5 MG/5ML PO ELIX: 12.5000 mg | ORAL_SOLUTION | Freq: Every evening | ORAL | Status: DC | PRN

## 2016-01-02 MED ORDER — TRAMADOL HCL 50 MG PO TABS
50.0000 mg | ORAL_TABLET | Freq: Four times a day (QID) | ORAL | Status: DC | PRN
  Administered 2016-01-02 – 2016-01-04 (×3): 50 mg via ORAL
  Filled 2016-01-02 (×3): qty 1

## 2016-01-02 MED ORDER — POTASSIUM CHLORIDE CRYS ER 20 MEQ PO TBCR
40.0000 meq | EXTENDED_RELEASE_TABLET | ORAL | Status: AC
  Administered 2016-01-02 (×3): 40 meq via ORAL
  Filled 2016-01-02 (×4): qty 2

## 2016-01-02 MED ORDER — ZOLPIDEM TARTRATE 5 MG PO TABS
5.0000 mg | ORAL_TABLET | Freq: Every evening | ORAL | Status: DC | PRN
  Administered 2016-01-02 – 2016-01-03 (×2): 5 mg via ORAL
  Filled 2016-01-02 (×2): qty 1

## 2016-01-02 MED FILL — Ondansetron HCl Tab 4 MG: ORAL | Qty: 4 | Status: AC

## 2016-01-02 NOTE — Progress Notes (Signed)
CRITICAL VALUE ALERT  Critical value received:  K = 2.7  Date of notification:  01/02/16  Time of notification:  0730  Critical value read back:Yes.    Nurse who received alert:  Antony Odea  MD notified (1st page):  Dr. Kerry Hough  Time of first page:  0735  MD notified (2nd page):  Time of second page:  Responding MD:    Time MD responded:

## 2016-01-02 NOTE — Care Management Note (Signed)
Case Management Note  Patient Details  Name: Cassandra [name] MRN: [id number] Date of Birth: 22-Nov-1933  Expected Discharge Date:    01/03/2016             Expected Discharge Plan:  Home w Home Health Services  In-House Referral:  Clinical Social Work  Discharge planning Services  CM Consult  Post Acute Care Choice:  Home Health Choice offered to:  Patient  DME Arranged:    DME Agency:     HH Arranged:  RN, PT, IV Antibiotics HH Agency:  [facility]  Status of Service:  In process, will continue to follow  Medicare Important Message Given:    Date Medicare IM Given:    Medicare IM give by:    Date Additional Medicare IM Given:    Additional Medicare Important Message give by:     If discussed at Long Length of Stay Meetings, dates discussed:    Additional Comments: PT has recommended SNF, pt has refused. Discussed DC plan with daughter [name]. Pt will need IV ABX, [name] is agreeable to learn to administer IV abx. [facility] updated on DC plan. Anticipate pt may DC home tomorrow, will cont to follow.   Malcolm Metro, RN 01/02/2016, 12:22 PM

## 2016-01-02 NOTE — Progress Notes (Signed)
            [facility] Staphylococcus aureus bacteremia   Staphylococcus aureus bacteremia (SAB) is associated with a high rate of complications and mortality.  Specific aspects of clinical management are critical to optimizing the outcome of patients with SAB.  Therefore, the [facility] Antimicrobial Management Team [facility]) has initiated an intervention aimed at improving the management of SAB at [facility].  To do so, Infectious Diseases physicians are providing an evidence-based consult for the management of all patients with SAB.     Yes No Comments  Perform follow-up blood cultures (even if the patient is afebrile) to ensure clearance of bacteremia   Blood cultures ordered today  Remove vascular catheter and obtain follow-up blood cultures after the removal of the catheter   I am not aware of her having a central line or PICC line, do not place such a line until we are at 72 hours negative blood cultures from blood drawn today    Perform echocardiography to evaluate for endocarditis (transthoracic ECHO is 40-50% sensitive, TEE is > 90% sensitive)   Please keep in mind, that neither test can definitively EXCLUDE endocarditis, and that should clinical suspicion remain high for endocarditis the patient should then still be treated with an "endocarditis" duration of therapy = 6 weeks  Consult electrophysiologist to evaluate implanted cardiac device (pacemaker, ICD)   NA  Ensure source control   Have all abscesses been drained effectively? Have deep seeded infections (septic joints or osteomyelitis) had appropriate  Surgicaltdebridement?  NOT CLEAR what source is Would get plain films of the foot and then likely an MRI of the foot  Investigate for "metastatic" sites of infection   Does the patient have ANY symptom or physical exam finding that would suggest a deeper infection (back or neck pain that may be suggestive of vertebral  osteomyelitis or epidural abscess, muscle pain that could be a symptom of pyomyositis)?  Keep in mind that for deep seeded infections MRI imaging with contrast is preferred rather than other often insensitive tests such as plain x-rays, especially early in a patient's presentation.  See above would get plain films of the foot then MRI    Change antibiotic therapy to vancomycin and cefazolin for now   Beta-lactam antibiotics are preferred for MSSA due to higher cure rates.   If on Vancomycin, goal trough should be 15 - 20 mcg/mL  Estimated duration of IV antibiotic therapy:  6 weeks   Consult case management for probably prolonged outpatient IV antibiotic therapy   Discussed with Dr. Kerry Hough with [facility].  [facility] [phone] (pager) [phone] (office) 01/02/2016, 7:36 PM  Paulette Blanch Dam 01/02/2016, 7:36 PM

## 2016-01-02 NOTE — Progress Notes (Signed)
[facility] PROGRESS NOTE  Cassandra [name] NFA:[id number] DOB: 11-Mar-1933 DOA: 12/29/2015 PCP: Colette Ribas, MD  Assessment/Plan: 1. Nausea/Vomiting/diarrhea/abd pain. Improving. Pain and vomiting resolved with mild diarrhea. Likely related to viral gastroenteritis. Abd/pelvis CT negative for acute findings. Continue symptomatic management and IVF. Tolerating diet.   2. Staph aureus bacteremia, revealed on 2/2 blood cultures.Sensitivities pending. Started on Vancomycin and Cefazolin. Will order ECHO for further evaluation of infection. Discussed with ID who confirms that she will likely need a prolong course of abx. Source of bacteremia not entirely clear at this time. Will check plain films of left foot to rule out any underlying infection of the foot. Will repeat blood cultures today. WBC elevated but remains afebrile.  3. Acute encephalopathy, appears to be at baseline. She does not have any focal neurological deficits likely related to previous fever.  4. Elevated troponin. Resolved. Denies any chest pain at this time. May be demand ischemia in the setting of dehydration.   5. Dehydration, improving with IVF. 6. Hypokalemia, will replete.  7. Chronic left foot wound, does not appear to be actively infected. Follow up with podiatry. ABI shows adequate blood flow bilaterally. Will check plain films to ensure there is no underlying infection in the foot.  8. Weakness, likely related to # 1, PT has consulted and recommended SNF upon discharge  9. HTN, BP stable 10. HLD, continue statin 11. Glaucoma, continue home management   Code Status: Full DVT prophylaxis: Heparin Family Communication: Daughter and family at bedside. Disposition Plan: Anticipate discharge to SNF once improved.    Consultants:  PT - SNF  Procedures:  ECHO Study Conclusions  - Left ventricle: The cavity size was normal. Systolic function was vigorous. The estimated ejection fraction was in the  range of 65% to 70%. Wall motion was normal; there were no regional wall motion abnormalities. Doppler parameters are consistent with abnormal left ventricular relaxation (grade 1 diastolic dysfunction). There was no evidence of elevated ventricular filling pressure by Doppler parameters. - Aortic valve: Trileaflet; normal thickness leaflets. There was no regurgitation. - Aortic root: The aortic root was normal in size. - Mitral valve: Structurally normal valve. There was mild regurgitation. - Left atrium: The atrium was mildly dilated. - Right ventricle: Systolic function was normal. - Tricuspid valve: There was mild regurgitation. - Pulmonic valve: There was no regurgitation. - Pulmonary arteries: Systolic pressure was moderately increased. PA peak pressure: 48 mm Hg (S). - Inferior vena cava: The vessel was normal in size. - Pericardium, extracardiac: There was no pericardial effusion.  Antibiotics:  Vancomycin 1/29>>  Cefazolin 1/29>>  HPI/Subjective: Feels overall improving but does have pain throughout her body. Has been out of bed and in the chair this morning. Breathing is doing well.   Objective: Filed Vitals:   01/02/16 0803 01/02/16 0900  BP:  155/77  Pulse: 71 83  Temp:  98.5 F (36.9 C)  Resp: 18 18    Intake/Output Summary (Last 24 hours) at 01/02/16 1020 Last data filed at 01/02/16 0917  Gross per 24 hour  Intake    840 ml  Output      0 ml  Net    840 ml   Filed Weights   12/29/15 0759 12/29/15 1957  Weight: 80.74 kg (178 lb) 81.2 kg (179 lb 0.2 oz)    Exam: General: NAD. Appears better today. More alert.  Cardiovascular: Regular rate and rhythm.  Respiratory: CTAB. No wheezes, rales, or rhonchi.   Abdomen: soft, non tender,  no distention, bowel sounds normal Musculoskeletal: No edema b/l. S/p left greater toe amputated. Eschar over amputation site without any drainage.   Data Reviewed: Basic Metabolic Panel:  Recent  Labs Lab 12/29/15 0806 12/30/15 0501 12/31/15 0546 01/01/16 1142 01/02/16 0534  NA 137 135 136 134* 136  K 3.9 3.4* 3.2* 3.5 2.7*  CL 101 105 105 104 104  CO2 20* 22  GLUCOSE 141* 108* 107* 114* 114*  BUN 21* 19 22* 24* 30*  CREATININE 1.06* 1.09* 0.90 0.84 0.99  CALCIUM 9.4 8.1* 8.2* 8.6* 8.5*   Liver Function Tests:  Recent Labs Lab 12/29/15 0806 12/30/15 0501 12/31/15 0546  AST ALT ALKPHOS 79 71 78  BILITOT 1.5* 0.7 1.0  PROT 7.2 6.0* 6.0*  ALBUMIN 3.8 3.0* 2.7*    Recent Labs Lab 12/29/15 0806  LIPASE 15   CBC:  Recent Labs Lab 12/29/15 0806 12/30/15 0501 12/31/15 0546 01/02/16 0534  WBC 19.2* 10.2 8.8 13.9*  HGB 13.2 12.0 11.4* 11.9*  HCT 39.5 36.0 34.1* 34.9*  MCV 91.6 92.1 91.4 89.7  PLT 191 133* 124* 159   Cardiac Enzymes:  Recent Labs Lab 12/30/15 0501 12/30/15 1218 12/30/15 1836 12/31/15 0059  TROPONINI 0.04* 0.04* 0.04* 0.03     Recent Results (from the past 240 hour(s))  C difficile quick scan w PCR reflex     Status: None   Collection Time: 12/30/15 12:28 AM  Result Value Ref Range Status   C Diff antigen NEGATIVE NEGATIVE Final   C Diff toxin NEGATIVE NEGATIVE Final   C Diff interpretation Negative for toxigenic C. difficile  Final    Comment: NEGATIVE  Culture, blood (routine x 2)     Status: None (Preliminary result)   Collection Time: 12/31/15 12:16 PM  Result Value Ref Range Status   Specimen Description BLOOD RIGHT ARM  Final   Special Requests BOTTLES DRAWN AEROBIC AND ANAEROBIC 6CC  Final   Culture  Setup Time   Final    GRAM POSITIVE COCCI IN CLUSTERS Gram Stain Report Called to,Read Back By and Verified With: LAVINDER, J. AT 0035 ON 01/01/2016 BY AGUNDIZ,E. BOTH BOTTLES    Culture PENDING  Incomplete   Report Status PENDING  Incomplete  Culture, blood (routine x 2)     Status: None (Preliminary result)   Collection Time: 12/31/15 12:33 PM  Result Value Ref Range Status   Specimen  Description BLOOD RIGHT ARM  Final   Special Requests BOTTLES DRAWN AEROBIC AND ANAEROBIC 6CC  Final   Culture  Setup Time   Final    GRAM POSITIVE COCCI IN CLUSTERS Gram Stain Report Called to,Read Back By and Verified With: LAVINDER,J. AT 0035 ON 01/01/2016 BY AGUNDIZ,E. BOTH BOTTLES   Culture PENDING  Incomplete   Report Status PENDING  Incomplete     Studies: Dg Chest Port 1 View  01/01/2016  CLINICAL DATA:  Shortness of breath, confusion EXAM: PORTABLE CHEST 1 VIEW COMPARISON:  12/29/2015 FINDINGS: Heart is borderline in size. Vascular congestion. Bilateral perihilar and lower lobe opacities are noted, right greater than left. This could represent asymmetric edema or infection. No visible effusions or acute bony abnormality. IMPRESSION: Borderline heart size. Vascular congestion with bilateral perihilar and lower lobe opacities, right greater than left, asymmetric edema or infection. Electronically Signed   By: Charlett Nose M.D.   On: 01/01/2016 11:45    Scheduled Meds: . antiseptic oral rinse  7 mL Mouth  Rinse BID  . aspirin  81 mg Oral Daily  . brinzolamide  1 drop Both Eyes TID   And  . brimonidine  1 drop Both Eyes TID  . calcium-vitamin D  1 tablet Oral Daily  .  ceFAZolin (ANCEF) IV  2 g Intravenous 3 times per day  . famotidine  10 mg Oral BID  . heparin  5,000 Units Subcutaneous 3 times per day  . latanoprost  1 drop Both Eyes QHS  . multivitamin-lutein  1 capsule Oral BID  . potassium chloride  40 mEq Oral Q3H  . sertraline  100 mg Oral Daily  . simvastatin  10 mg Oral QHS  . vancomycin  750 mg Intravenous Q12H   Continuous Infusions:    Active Problems:   Hypertension   Hyperlipidemia   Gastroenteritis   Glaucoma   Staphylococcus aureus bacteremia   Time spent: 35 minutes   Jhalil Silvera. MD  [facility] Pager [phone]. If 7PM-7AM, please contact night-coverage at [email or web address], password [facility] 01/02/2016, 10:20 AM  LOS: 1 day    By signing my  name below, I, Zadie Cleverly, attest that this documentation has been prepared under the direction and in the presence of Erick Blinks, MD. Electronically signed: Zadie Cleverly, Scribe. 01/02/2016 12:15pm   I, Dr. Erick Blinks, personally performed the services described in this documentaiton. All medical record entries made by the scribe were at my direction and in my presence. I have reviewed the chart and agree that the record reflects my personal performance and is accurate and complete  Erick Blinks, MD, 01/02/2016 12:38 PM

## 2016-01-03 ENCOUNTER — Inpatient Hospital Stay (HOSPITAL_COMMUNITY): Payer: Medicare Other

## 2016-01-03 LAB — BASIC METABOLIC PANEL
ANION GAP: 7 (ref 5–15)
BUN: 37 mg/dL — ABNORMAL HIGH (ref 6–20)
CHLORIDE: 106 mmol/L (ref 101–111)
CO2: 20 mmol/L — ABNORMAL LOW (ref 22–32)
Calcium: 8.6 mg/dL — ABNORMAL LOW (ref 8.9–10.3)
Creatinine, Ser: 0.91 mg/dL (ref 0.44–1.00)
GFR calc non Af Amer: 58 mL/min — ABNORMAL LOW (ref >60)
Glucose, Bld: 126 mg/dL — ABNORMAL HIGH (ref 65–99)
POTASSIUM: 3.7 mmol/L (ref 3.5–5.1)
SODIUM: 133 mmol/L — AB (ref 135–145)

## 2016-01-03 LAB — CULTURE, BLOOD (ROUTINE X 2)

## 2016-01-03 LAB — CBC
HCT: 34.6 % — ABNORMAL LOW (ref 36.0–46.0)
HEMOGLOBIN: 12 g/dL (ref 12.0–15.0)
MCH: 30.8 pg (ref 26.0–34.0)
MCHC: 34.7 g/dL (ref 30.0–36.0)
MCV: 88.7 fL (ref 78.0–100.0)
Platelets: 179 10*3/uL (ref 150–400)
RBC: 3.9 MIL/uL (ref 3.87–5.11)
RDW: 13.8 % (ref 11.5–15.5)
WBC: 20 10*3/uL — ABNORMAL HIGH (ref 4.0–10.5)

## 2016-01-03 LAB — C-REACTIVE PROTEIN: CRP: 24 mg/dL — ABNORMAL HIGH (ref <1.0)

## 2016-01-03 LAB — SEDIMENTATION RATE: SED RATE: 75 mm/h — AB (ref 0–22)

## 2016-01-03 MED ORDER — ALBUTEROL SULFATE (2.5 MG/3ML) 0.083% IN NEBU
2.5000 mg | INHALATION_SOLUTION | Freq: Four times a day (QID) | RESPIRATORY_TRACT | Status: DC | PRN
  Administered 2016-01-03: 2.5 mg via RESPIRATORY_TRACT
  Filled 2016-01-03: qty 3

## 2016-01-03 NOTE — Care Management Note (Signed)
Case Management Note  Patient Details  Name: Cassandra [name] MRN: [id number] Date of Birth: 07-12-1933  Expected Discharge Date:                  Expected Discharge Plan:  Home w Home Health Services  In-House Referral:  Clinical Social Work  Discharge planning Services  CM Consult  Post Acute Care Choice:  Home Health Choice offered to:  Patient  DME Arranged:    DME Agency:     HH Arranged:  RN, PT, IV Antibiotics HH Agency:  [facility]  Status of Service:  In process, will continue to follow  Medicare Important Message Given:    Date Medicare IM Given:    Medicare IM give by:    Date Additional Medicare IM Given:    Additional Medicare Important Message give by:     If discussed at Long Length of Stay Meetings, dates discussed:  01/03/2016  Additional Comments: MRI showing signs of osteo, pt will require long term IV abx, MD has asked podiatry to see pt. Will cont to follow.   Malcolm Metro, RN 01/03/2016, 2:49 PM

## 2016-01-03 NOTE — Progress Notes (Signed)
[facility] PROGRESS NOTE  Cassandra [name] NWG:[id number] DOB: 1933-07-25 DOA: 12/29/2015 PCP: Colette Ribas, MD  Summary:   82 year old female who presented to the hospital with complaints of nausea, vomiting and abdominal pain. She was found to have a possible viral gastroenteritis and was treated supportively. During her hospital stay, she developed significant fevers. Blood cultures were sent and returned positive for MSSA bacteremia. He is not entirely clear. It may be related to the chronic wound on her foot. MRI of the foot could not rule out underlying osteoarthritis versus abscess. Podiatry has been consulted. Currently she is on intravenous antibiotics. Anticipate that she'll be in the hospital several more days. Eventual plan will be to discharge to skilled nursing facility.  Assessment/Plan: 1. Nausea/Vomiting/diarrhea/abd pain. Improving. Pain and vomiting resolved with mild diarrhea. Likely related to viral gastroenteritis. Abd/pelvis CT negative for acute findings. Continue symptomatic management and IVF. Tolerating diet.   2. MSSA bacteremia, revealed on 2/2 blood cultures. Currently on Cefazolin. 2D echo done that did not show evidence of vegetations. Discussed with ID who recommended a 6 week course of abx. Source of bacteremia not entirely clear at this time, although may be related to her foot. Repeat blood cultures sent on 1/30. If cultures show no growth for 72 hours, then PICC line can be placed.  3. Acute encephalopathy, appears to be more confused today. In light of bacteremia, will check MRI brain to evaluate for septic emboli.  4. Elevated troponin. Resolved. Denies any chest pain at this time. May be demand ischemia in the setting of dehydration.   5. Dehydration, improving with IVF. 6. Hypokalemia, will replete.  7. Chronic left foot wound . MRI of foot shows possible underlying osteomyelitis, cannot rule out underlying abscess. May be related to bacteremia.  Podiatry consulted..  8. Weakness, likely related to # 1, PT has consulted and recommended SNF upon discharge  9. HTN, BP stable 10. HLD, continue statin 11. Glaucoma, continue home management   Code Status: Full DVT prophylaxis: Heparin Family Communication: Daughter over the phonemri Disposition Plan: Anticipate discharge to SNF once improved.    Consultants:  PT - SNF  Procedures:  ECHO Study Conclusions  - Left ventricle: The cavity size was normal. Systolic function was vigorous. The estimated ejection fraction was in the range of 65% to 70%. Wall motion was normal; there were no regional wall motion abnormalities. Doppler parameters are consistent with abnormal left ventricular relaxation (grade 1 diastolic dysfunction). There was no evidence of elevated ventricular filling pressure by Doppler parameters. - Aortic valve: Trileaflet; normal thickness leaflets. There was no regurgitation. - Aortic root: The aortic root was normal in size. - Mitral valve: Structurally normal valve. There was mild regurgitation. - Left atrium: The atrium was mildly dilated. - Right ventricle: Systolic function was normal. - Tricuspid valve: There was mild regurgitation. - Pulmonic valve: There was no regurgitation. - Pulmonary arteries: Systolic pressure was moderately increased. PA peak pressure: 48 mm Hg (S). - Inferior vena cava: The vessel was normal in size. - Pericardium, extracardiac: There was no pericardial effusion.  Antibiotics:  Vancomycin 1/29>>1/31  Cefazolin 1/29>>  HPI/Subjective: Confused. Denies any complaints.  Objective: Filed Vitals:   01/03/16 0700 01/03/16 1257  BP: 191/95 125/95  Pulse:  98  Temp:  97.9 F (36.6 C)  Resp:  18    Intake/Output Summary (Last 24 hours) at 01/03/16 1558 Last data filed at 01/03/16 1300  Gross per 24 hour  Intake    930  ml  Output      0 ml  Net    930 ml   Filed Weights   12/29/15 0759  12/29/15 1957  Weight: 80.74 kg (178 lb) 81.2 kg (179 lb 0.2 oz)    Exam: General: NAD. Somewhat somnolent and confused  Cardiovascular: Regular rate and rhythm.  Respiratory: CTAB. No wheezes, rales, or rhonchi.   Abdomen: soft, non tender, no distention, bowel sounds normal Musculoskeletal: No edema b/l. S/p left greater toe amputated. Eschar over amputation site without any drainage.   Data Reviewed: Basic Metabolic Panel:  Recent Labs Lab 12/30/15 0501 12/31/15 0546 01/01/16 1142 01/02/16 0534 01/03/16 0525  NA 135 136 134* 136 133*  K 3.4* 3.2* 3.5 2.7* 3.7  CL 105 105 104 104 106  CO2 22 24 20* 22 20*  GLUCOSE 108* 107* 114* 114* 126*  BUN 19 22* 24* 30* 37*  CREATININE 1.09* 0.90 0.84 0.99 0.91  CALCIUM 8.1* 8.2* 8.6* 8.5* 8.6*   Liver Function Tests:  Recent Labs Lab 12/29/15 0806 12/30/15 0501 12/31/15 0546  AST 24 25 24   ALT 16 21 20   ALKPHOS 79 71 78  BILITOT 1.5* 0.7 1.0  PROT 7.2 6.0* 6.0*  ALBUMIN 3.8 3.0* 2.7*    Recent Labs Lab 12/29/15 0806  LIPASE 15   CBC:  Recent Labs Lab 12/29/15 0806 12/30/15 0501 12/31/15 0546 01/02/16 0534 01/03/16 0525  WBC 19.2* 10.2 8.8 13.9* 20.0*  HGB 13.2 12.0 11.4* 11.9* 12.0  HCT 39.5 36.0 34.1* 34.9* 34.6*  MCV 91.6 92.1 91.4 89.7 88.7  PLT 191 133* 124* 159 179   Cardiac Enzymes:  Recent Labs Lab 12/30/15 0501 12/30/15 1218 12/30/15 1836 12/31/15 0059  TROPONINI 0.04* 0.04* 0.04* 0.03     Recent Results (from the past 240 hour(s))  C difficile quick scan w PCR reflex     Status: None   Collection Time: 12/30/15 12:28 AM  Result Value Ref Range Status   C Diff antigen NEGATIVE NEGATIVE Final   C Diff toxin NEGATIVE NEGATIVE Final   C Diff interpretation Negative for toxigenic C. difficile  Final    Comment: NEGATIVE  Culture, blood (routine x 2)     Status: None   Collection Time: 12/31/15 12:16 PM  Result Value Ref Range Status   Specimen Description BLOOD RIGHT ARM  Final    Special Requests BOTTLES DRAWN AEROBIC AND ANAEROBIC 6CC  Final   Culture  Setup Time   Final    GRAM POSITIVE COCCI IN CLUSTERS IN BOTH AEROBIC AND ANAEROBIC BOTTLES CRITICAL RESULT CALLED TO, READ BACK BY AND VERIFIED WITH: LAVINDER, J. AT 0035 ON 01/01/2016 BY AGUNDIZ,E.    Culture   Final    STAPHYLOCOCCUS AUREUS Performed at [facility]    Report Status 01/03/2016 FINAL  Final   Organism ID, Bacteria STAPHYLOCOCCUS AUREUS  Final      Susceptibility   Staphylococcus aureus - MIC*    CIPROFLOXACIN <=0.5 SENSITIVE Sensitive     ERYTHROMYCIN <=0.25 SENSITIVE Sensitive     GENTAMICIN <=0.5 SENSITIVE Sensitive     OXACILLIN <=0.25 SENSITIVE Sensitive     TETRACYCLINE <=1 SENSITIVE Sensitive     VANCOMYCIN <=0.5 SENSITIVE Sensitive     TRIMETH/SULFA <=10 SENSITIVE Sensitive     CLINDAMYCIN <=0.25 SENSITIVE Sensitive     RIFAMPIN <=0.5 SENSITIVE Sensitive     Inducible Clindamycin NEGATIVE Sensitive     * STAPHYLOCOCCUS AUREUS  Culture, blood (routine x 2)  Status: None   Collection Time: 12/31/15 12:33 PM  Result Value Ref Range Status   Specimen Description BLOOD RIGHT ARM  Final   Special Requests BOTTLES DRAWN AEROBIC AND ANAEROBIC 6CC  Final   Culture  Setup Time   Final    GRAM POSITIVE COCCI IN CLUSTERS IN BOTH AEROBIC AND ANAEROBIC BOTTLES CRITICAL RESULT CALLED TO, READ BACK BY AND VERIFIED WITH: LAVINDER,J. AT 0454 ON 01/01/2016 BY AGUNDIZ,E.    Culture   Final    STAPHYLOCOCCUS AUREUS SUSCEPTIBILITIES PERFORMED ON PREVIOUS CULTURE WITHIN THE LAST 5 DAYS. Performed at [facility]    Report Status 01/03/2016 FINAL  Final  Culture, blood (Routine X 2) w Reflex to ID Panel     Status: None (Preliminary result)   Collection Time: 01/02/16  2:11 PM  Result Value Ref Range Status   Specimen Description BLOOD RIGHT ANTECUBITAL  Final   Special Requests   Final    BOTTLES DRAWN AEROBIC AND ANAEROBIC 6CC EACH  IMMUNE:COMPROMISED   Culture NO GROWTH < 24  HOURS  Final   Report Status PENDING  Incomplete  Culture, blood (Routine X 2) w Reflex to ID Panel     Status: None (Preliminary result)   Collection Time: 01/02/16  2:13 PM  Result Value Ref Range Status   Specimen Description BLOOD RIGHT HAND  Final   Special Requests   Final    BOTTLES DRAWN AEROBIC AND ANAEROBIC 6CC  IMMUNE:COMPROMISED   Culture NO GROWTH < 24 HOURS  Final   Report Status PENDING  Incomplete     Studies: Mr Foot Left Wo Contrast  01/03/2016  CLINICAL DATA:  Bacteremia. Open wounds on the left foot with history of amputation. EXAM: MRI OF THE LEFT FOREFOOT WITHOUT CONTRAST TECHNIQUE: Multiplanar, multisequence MR imaging was performed. No intravenous contrast was administered. COMPARISON:  01/02/2016; 09/15/2014 FINDINGS: The patient refused IV contrast. Despite efforts by the technologist and patient, severe motion artifact is present on today's exam and could not be eliminated. This reduces exam sensitivity and specificity. First digit amputation at the MTP joint, with a approximately 1.5 by 0.8 by 0.8 cm fluid collection just plantar to the head of the first metatarsal as shown on images 27-32 of series 10, and surrounding abnormal inflammatory effacement of the adipose tissues. Abscess not excluded. Low signal intensity focus on image 33 series 9 possibly a tiny microscopic metallic particle or tiny focus of gas along the amputation margin. As on conventional radiographs, we demonstrate erosive findings in the head of the second and third metatarsals as well as in the bases of the proximal phalanges of the second and third toes. There is some low-level increased T2 signal along the third metatarsal head as on image 27 series 10 Low-level edema tracks along the plantar musculature of the foot. There is some mild subcutaneous edema dorsally in the foot. Degenerative findings along the Lisfranc joint. No Lisfranc joint malalignment identified. IMPRESSION: 1. Erosive findings at  the second and third metatarsophalangeal joints are confirmed. This could represent infection, gout, or rheumatoid arthropathy. Particularly in the head of the third metatarsal, osteomyelitis is of greater suspicion. Today's images are severely degraded by motion artifact, and the patient refused IV contrast. 2. Fluid collection plantar to the head of the first metatarsal -abscess not excluded given the inflammatory effacement of the surrounding fatty subcutaneous tissues. Electronically Signed   By: Gaylyn Rong M.D.   On: 01/03/2016 08:41   Dg Foot Complete Left  01/02/2016  CLINICAL DATA:  Open wound of the left foot.  Great toe amputated. EXAM: LEFT FOOT - COMPLETE 3+ VIEW COMPARISON:  09/29/2014 FINDINGS: The great toe has been amputated. The patient has developed what is most likely a chronic dislocation at the second metatarsal phalangeal joint with an erosion of the metatarsal head. There is bone resorption of the distorted distal third metatarsal with deformity consistent with a prior fracture. There is also a new deformity of the distal fourth metatarsal consistent with an old fracture. The bones of the second through fifth toes appear normal. There is no ulceration on the plantar aspect of the ball of the foot. No significant abnormality of the hindfoot or midfoot. IMPRESSION: Chronic changes at the second and third metatarsal phalangeal joints. I doubt that either one of these represents osteomyelitis. Electronically Signed   By: Francene Boyers M.D.   On: 01/02/2016 13:59    Scheduled Meds: . antiseptic oral rinse  7 mL Mouth Rinse BID  . aspirin  81 mg Oral Daily  . brinzolamide  1 drop Both Eyes TID   And  . brimonidine  1 drop Both Eyes TID  . calcium-vitamin D  1 tablet Oral Daily  .  ceFAZolin (ANCEF) IV  2 g Intravenous 3 times per day  . famotidine  10 mg Oral BID  . heparin  5,000 Units Subcutaneous 3 times per day  . latanoprost  1 drop Both Eyes QHS  . multivitamin-lutein   1 capsule Oral BID  . sertraline  100 mg Oral Daily  . simvastatin  10 mg Oral QHS   Continuous Infusions:    Active Problems:   Hypertension   Hyperlipidemia   Gastroenteritis   Glaucoma   Staphylococcus aureus bacteremia   Time spent: 35 minutes   Jehanzeb Memon. MD  [facility] Pager [phone]. If 7PM-7AM, please contact night-coverage at [email or web address], password [facility] 01/03/2016, 3:58 PM  LOS: 2 days

## 2016-01-03 NOTE — Progress Notes (Signed)
Dressing change to left great toe completed.

## 2016-01-03 NOTE — Progress Notes (Signed)
PT Cancellation Note  Patient Details Name: Cassandra [name] MRN: [id number] DOB: 1933-05-11   Cancelled Treatment:    Reason Eval/Treat Not Completed: Patient not medically ready. Chart reviewed, RN consulted. New imaging findings regarding L foot are available: pt may need need weight bearing status. Discussed c Dr. Kerry Hough, who is agreeable to hold until podiatry is consulted. Attempted in-bed treatment session, but pt is too lethargic to participate, waking to moderate noises or tactile stimulation, but falling back asleep almost instantly; snoring and talking at the same time. Will continue to follow and attempt treatment at later date/time as medically ready.    1:14 PM, 01/03/2016 Rosamaria Lints, PT, DPT PRN Physical Therapist at [facility] [place] License # [id number] [phone] (wireless)  [phone] (mobile)

## 2016-01-03 NOTE — Consult Note (Addendum)
Podiatry Consult Note  Reason for Consultation:  Ulceration of left foot  History of Present Illness: Cassandra [name] is a 82 y.o. female who presented to the emergency department on December 29, 2015 complaining of flank pain.  She was admitted with symptoms attributed to viral gastroenteritis, dehydration and weakness.  She was later found to have staph aureus bacteremia.  Infectious disease was consulted.  Radiographs of her left foot were performed.  An MRI of her left foot was performed with concern for possible abscess.  The patient is well known to me from my [place] office.  She has been followed for a chronic ulceration of her left foot.  I was consulted today for evaluation of the patient's left foot as a possible source of the bacteremia.  Her granddaughter is present at bedside.    Past Medical History  Diagnosis Date  . Hypertension   . Arthritis   . Depression   . Hyperlipidemia   . PFO (patent foramen ovale)     small  . H/O echocardiogram 2008    prolapse of post. mitral leaflet, tr MR, tr TR, EF >55%, mild concentric LVH  . H/O cardiovascular stress test 10/13/2007    low risk scan, no changes from previous test  . Heart palpitations     controlled on BB  . Hiatal hernia   . Cellulitis and abscess of toe of left foot 09/16/2014   Scheduled Meds: . antiseptic oral rinse  7 mL Mouth Rinse BID  . aspirin  81 mg Oral Daily  . brinzolamide  1 drop Both Eyes TID   And  . brimonidine  1 drop Both Eyes TID  . calcium-vitamin D  1 tablet Oral Daily  .  ceFAZolin (ANCEF) IV  2 g Intravenous 3 times per day  . famotidine  10 mg Oral BID  . heparin  5,000 Units Subcutaneous 3 times per day  . latanoprost  1 drop Both Eyes QHS  . multivitamin-lutein  1 capsule Oral BID  . sertraline  100 mg Oral Daily  . simvastatin  10 mg Oral QHS   Continuous Infusions:  PRN Meds:.acetaminophen **OR** acetaminophen, albuterol, haloperidol lactate, loperamide, ondansetron, polyvinyl  alcohol, traMADol, zolpidem  Allergies  Allergen Reactions  . Codeine Shortness Of Breath and Nausea And Vomiting  . Hydrocodone Shortness Of Breath and Nausea And Vomiting   Past Surgical History  Procedure Laterality Date  . Cholecystectomy    . Cardiac catheterization  10/2002    normal coronary arteries, normal LV function  . Incision and drainage Right 07/21/2014    Procedure: INCISION AND DRAINAGE;  Surgeon: Dallas Schimke, DPM;  Location: [facility];  Service: Podiatry;  Laterality: Right;  . Incision and drainage Left 09/17/2014    Procedure: INCISION AND DRAINAGE 1ST TOE LEFT FOOT;  Surgeon: Dallas Schimke, DPM;  Location: [facility];  Service: Podiatry;  Laterality: Left;  . Bone biopsy Left 09/17/2014    Procedure: BONE BIOPSY;  Surgeon: Dallas Schimke, DPM;  Location: [facility];  Service: Podiatry;  Laterality: Left;  . Amputation Left 09/20/2014    Procedure: AMPUTATION DIGIT (LEFT GREAT TOE);  Surgeon: Dallas Schimke, DPM;  Location: [facility];  Service: Podiatry;  Laterality: Left;   Family History  Problem Relation Age of Onset  . Heart disease Mother   . Cancer Father   . Kidney disease Brother   . Hypertension Daughter   . Hyperlipidemia Son    Social History:  reports that  she has never smoked. She has never used smokeless tobacco. She reports that she does not drink alcohol or use illicit drugs.  Review of Systems: No nausea or vomiting currently.  No fever currently.  Fever upon admission per granddaughter.  Significant for right flank pain.  The patient is confused.    Physical Examination: Vital signs in last 24 hours:   Temp:  [97.7 F (36.5 C)-98.2 F (36.8 C)] 97.9 F (36.6 C) (01/31 1257) Pulse Rate:  [91-98] 98 (01/31 1257) Resp:  [18-20] 18 (01/31 1257) BP: (125-191)/(82-95) 125/95 mmHg (01/31 1257) SpO2:  [94 %-100 %] 100 % (01/31 1257)  GENERAL: Confused.  This is not baseline for this patient. EQUIPMENT: Silvadene  dressing on the left foot. INTEGUMENT: Skin of both feet is warm and dry.  There is a hemorrhagic hyperkeratotic lesion present along the plantar aspect of the left first metatarsal head.  Upon debridement, an ulceration was encountered measuring 0.4 cm in length by 0.3 cm in width by 0.2 cm in depth extending to the dermis.  Mild sanguinous exudate is present.  The wound bed is granular.  There is no undermining.  There is no tunneling.  The ulceration does not probe to bone.   VASCULAR: DP pulse is palpable bilaterally.  PT pulse is 1/4 on the right.  I was unable to palpate a PT pulse on the left.  There is edema of the left lower extremity. MUSCULOSKELETAL: Left great toe has been amputated.  There is contracture of the lesser digits of the left foot.  This is most pronounced involving the left second toe. NEUROLOGIC: Protective sensation is diminished bilaterally.  Lab/Test Results:   Recent Labs  01/02/16 0534 01/03/16 0525  WBC 13.9* 20.0*  HGB 11.9* 12.0  HCT 34.9* 34.6*  PLT 159 179  NA 136 133*  K 2.7* 3.7  CL 104 106  CO2 22 20*  BUN 30* 37*  CREATININE 0.99 0.91  GLUCOSE 114* 126*  CALCIUM 8.5* 8.6*    Recent Results (from the past 240 hour(s))  C difficile quick scan w PCR reflex     Status: None   Collection Time: 12/30/15 12:28 AM  Result Value Ref Range Status   C Diff antigen NEGATIVE NEGATIVE Final   C Diff toxin NEGATIVE NEGATIVE Final   C Diff interpretation Negative for toxigenic C. difficile  Final    Comment: NEGATIVE  Culture, blood (routine x 2)     Status: None   Collection Time: 12/31/15 12:16 PM  Result Value Ref Range Status   Specimen Description BLOOD RIGHT ARM  Final   Special Requests BOTTLES DRAWN AEROBIC AND ANAEROBIC 6CC  Final   Culture  Setup Time   Final    GRAM POSITIVE COCCI IN CLUSTERS IN BOTH AEROBIC AND ANAEROBIC BOTTLES CRITICAL RESULT CALLED TO, READ BACK BY AND VERIFIED WITH: LAVINDER, J. AT 0035 ON 01/01/2016 BY AGUNDIZ,E.     Culture   Final    STAPHYLOCOCCUS AUREUS Performed at [facility]    Report Status 01/03/2016 FINAL  Final   Organism ID, Bacteria STAPHYLOCOCCUS AUREUS  Final      Susceptibility   Staphylococcus aureus - MIC*    CIPROFLOXACIN <=0.5 SENSITIVE Sensitive     ERYTHROMYCIN <=0.25 SENSITIVE Sensitive     GENTAMICIN <=0.5 SENSITIVE Sensitive     OXACILLIN <=0.25 SENSITIVE Sensitive     TETRACYCLINE <=1 SENSITIVE Sensitive     VANCOMYCIN <=0.5 SENSITIVE Sensitive     TRIMETH/SULFA <=10  SENSITIVE Sensitive     CLINDAMYCIN <=0.25 SENSITIVE Sensitive     RIFAMPIN <=0.5 SENSITIVE Sensitive     Inducible Clindamycin NEGATIVE Sensitive     * STAPHYLOCOCCUS AUREUS  Culture, blood (routine x 2)     Status: None   Collection Time: 12/31/15 12:33 PM  Result Value Ref Range Status   Specimen Description BLOOD RIGHT ARM  Final   Special Requests BOTTLES DRAWN AEROBIC AND ANAEROBIC 6CC  Final   Culture  Setup Time   Final    GRAM POSITIVE COCCI IN CLUSTERS IN BOTH AEROBIC AND ANAEROBIC BOTTLES CRITICAL RESULT CALLED TO, READ BACK BY AND VERIFIED WITH: LAVINDER,J. AT 4098 ON 01/01/2016 BY AGUNDIZ,E.    Culture   Final    STAPHYLOCOCCUS AUREUS SUSCEPTIBILITIES PERFORMED ON PREVIOUS CULTURE WITHIN THE LAST 5 DAYS. Performed at [facility]    Report Status 01/03/2016 FINAL  Final  Culture, blood (Routine X 2) w Reflex to ID Panel     Status: None (Preliminary result)   Collection Time: 01/02/16  2:11 PM  Result Value Ref Range Status   Specimen Description BLOOD RIGHT ANTECUBITAL  Final   Special Requests   Final    BOTTLES DRAWN AEROBIC AND ANAEROBIC 6CC EACH  IMMUNE:COMPROMISED   Culture NO GROWTH < 24 HOURS  Final   Report Status PENDING  Incomplete  Culture, blood (Routine X 2) w Reflex to ID Panel     Status: None (Preliminary result)   Collection Time: 01/02/16  2:13 PM  Result Value Ref Range Status   Specimen Description BLOOD RIGHT HAND  Final   Special Requests    Final    BOTTLES DRAWN AEROBIC AND ANAEROBIC 6CC  IMMUNE:COMPROMISED   Culture NO GROWTH < 24 HOURS  Final   Report Status PENDING  Incomplete     Mr Foot Left Wo Contrast  01/03/2016  CLINICAL DATA:  Bacteremia. Open wounds on the left foot with history of amputation. EXAM: MRI OF THE LEFT FOREFOOT WITHOUT CONTRAST TECHNIQUE: Multiplanar, multisequence MR imaging was performed. No intravenous contrast was administered. COMPARISON:  01/02/2016; 09/15/2014 FINDINGS: The patient refused IV contrast. Despite efforts by the technologist and patient, severe motion artifact is present on today's exam and could not be eliminated. This reduces exam sensitivity and specificity. First digit amputation at the MTP joint, with a approximately 1.5 by 0.8 by 0.8 cm fluid collection just plantar to the head of the first metatarsal as shown on images 27-32 of series 10, and surrounding abnormal inflammatory effacement of the adipose tissues. Abscess not excluded. Low signal intensity focus on image 33 series 9 possibly a tiny microscopic metallic particle or tiny focus of gas along the amputation margin. As on conventional radiographs, we demonstrate erosive findings in the head of the second and third metatarsals as well as in the bases of the proximal phalanges of the second and third toes. There is some low-level increased T2 signal along the third metatarsal head as on image 27 series 10 Low-level edema tracks along the plantar musculature of the foot. There is some mild subcutaneous edema dorsally in the foot. Degenerative findings along the Lisfranc joint. No Lisfranc joint malalignment identified. IMPRESSION: 1. Erosive findings at the second and third metatarsophalangeal joints are confirmed. This could represent infection, gout, or rheumatoid arthropathy. Particularly in the head of the third metatarsal, osteomyelitis is of greater suspicion. Today's images are severely degraded by motion artifact, and the patient  refused IV contrast. 2. Fluid collection plantar to  the head of the first metatarsal -abscess not excluded given the inflammatory effacement of the surrounding fatty subcutaneous tissues. Electronically Signed   By: Gaylyn Rong M.D.   On: 01/03/2016 08:41   Dg Foot Complete Left  01/02/2016  CLINICAL DATA:  Open wound of the left foot.  Great toe amputated. EXAM: LEFT FOOT - COMPLETE 3+ VIEW COMPARISON:  09/29/2014 FINDINGS: The great toe has been amputated. The patient has developed what is most likely a chronic dislocation at the second metatarsal phalangeal joint with an erosion of the metatarsal head. There is bone resorption of the distorted distal third metatarsal with deformity consistent with a prior fracture. There is also a new deformity of the distal fourth metatarsal consistent with an old fracture. The bones of the second through fifth toes appear normal. There is no ulceration on the plantar aspect of the ball of the foot. No significant abnormality of the hindfoot or midfoot. IMPRESSION: Chronic changes at the second and third metatarsal phalangeal joints. I doubt that either one of these represents osteomyelitis. Electronically Signed   By: Francene Boyers M.D.   On: 01/02/2016 13:59   Assessment: 1.  Ulceration, left foot. 2.  Peripheral neuropathy.  Plan: The clinical findings were explained to the patient's granddaughter.  A consent was obtained.  An excisional debridement of the ulceration was performed in an aseptic manner with a #312 blade and soft tissue nipper down to and including epidermal and dermal tissue.  Nonviable tissue was removed.  A culture of the ulceration was performed.  No anesthesia was required secondary to patient tolerance.  Estimated blood loss was minimal.  Aspiration of the left foot was performed using an 18-gauge needle.  Approximately 0.5 cc of serosanguineous aspirate was obtained.  No purulence was encountered. Hemostasis was achieved with  compression.  An Aquacel dressing was applied.  This dressing is to be changed daily.  A surgical shoe was ordered.  I recommend that she remain nonweightbearing on her left foot while participating with physical therapy.  Overall, the ulceration of her left foot has improved.  The radiographic findings are unchanged from the radiographs performed in my office.  I agree with Dr. Jena Gauss and doubt that the radiographic findings are consistent with osteomyelitis.  There is motion artifact on the MRI and it is difficult to interpret.  My suspicion is low that the source of her bacteremia is her left foot.  I will follow.  Thank you for this consultation and allowing me to participate in the care of Ms. Lesiak.  Agustus Mane IVAN 01/03/2016, 5:24 PM

## 2016-01-04 ENCOUNTER — Inpatient Hospital Stay (HOSPITAL_COMMUNITY): Payer: Medicare Other

## 2016-01-04 DIAGNOSIS — R112 Nausea with vomiting, unspecified: Secondary | ICD-10-CM

## 2016-01-04 DIAGNOSIS — R1110 Vomiting, unspecified: Secondary | ICD-10-CM | POA: Insufficient documentation

## 2016-01-04 DIAGNOSIS — R0989 Other specified symptoms and signs involving the circulatory and respiratory systems: Secondary | ICD-10-CM

## 2016-01-04 LAB — BASIC METABOLIC PANEL
Anion gap: 9 (ref 5–15)
BUN: 47 mg/dL — AB (ref 6–20)
CALCIUM: 8.7 mg/dL — AB (ref 8.9–10.3)
CO2: 21 mmol/L — ABNORMAL LOW (ref 22–32)
CREATININE: 1.05 mg/dL — AB (ref 0.44–1.00)
Chloride: 106 mmol/L (ref 101–111)
GFR, EST AFRICAN AMERICAN: 57 mL/min — AB (ref >60)
GFR, EST NON AFRICAN AMERICAN: 49 mL/min — AB (ref >60)
Glucose, Bld: 129 mg/dL — ABNORMAL HIGH (ref 65–99)
Potassium: 3.8 mmol/L (ref 3.5–5.1)
SODIUM: 136 mmol/L (ref 135–145)

## 2016-01-04 LAB — CBC
HEMATOCRIT: 37.3 % (ref 36.0–46.0)
Hemoglobin: 12.7 g/dL (ref 12.0–15.0)
MCH: 30.2 pg (ref 26.0–34.0)
MCHC: 34 g/dL (ref 30.0–36.0)
MCV: 88.8 fL (ref 78.0–100.0)
Platelets: 204 10*3/uL (ref 150–400)
RBC: 4.2 MIL/uL (ref 3.87–5.11)
RDW: 13.9 % (ref 11.5–15.5)
WBC: 25.2 10*3/uL — AB (ref 4.0–10.5)

## 2016-01-04 LAB — HEPATITIS C ANTIBODY (REFLEX)

## 2016-01-04 LAB — HCV COMMENT:

## 2016-01-04 LAB — HIV ANTIBODY (ROUTINE TESTING W REFLEX): HIV SCREEN 4TH GENERATION: NONREACTIVE

## 2016-01-04 MED ORDER — NYSTATIN 100000 UNIT/ML MT SUSP
5.0000 mL | Freq: Four times a day (QID) | OROMUCOSAL | Status: AC
  Administered 2016-01-04 – 2016-01-11 (×26): 500000 [IU] via ORAL
  Filled 2016-01-04 (×26): qty 5

## 2016-01-04 MED ORDER — NAFCILLIN SODIUM 2 G IJ SOLR
2.0000 g | INTRAVENOUS | Status: DC
  Administered 2016-01-04 – 2016-01-13 (×55): 2 g via INTRAVENOUS
  Filled 2016-01-04 (×67): qty 2000

## 2016-01-04 MED ORDER — ASPIRIN 300 MG RE SUPP
300.0000 mg | Freq: Every day | RECTAL | Status: DC
  Administered 2016-01-04 – 2016-01-05 (×2): 300 mg via RECTAL
  Filled 2016-01-04 (×2): qty 1

## 2016-01-04 MED ORDER — NAFCILLIN SODIUM 2 G IJ SOLR
2.0000 g | INTRAMUSCULAR | Status: DC
Start: 2016-01-04 — End: 2016-01-04
  Filled 2016-01-04 (×4): qty 2000

## 2016-01-04 MED ORDER — MORPHINE SULFATE (PF) 2 MG/ML IV SOLN
1.0000 mg | INTRAVENOUS | Status: DC | PRN
  Administered 2016-01-06 – 2016-01-13 (×16): 1 mg via INTRAVENOUS
  Filled 2016-01-04 (×17): qty 1

## 2016-01-04 MED ORDER — SODIUM CHLORIDE 0.9 % IV SOLN
INTRAVENOUS | Status: DC
  Administered 2016-01-05 (×3): via INTRAVENOUS

## 2016-01-04 MED ORDER — STROKE: EARLY STAGES OF RECOVERY BOOK
Freq: Once | Status: DC
  Filled 2016-01-04 (×2): qty 1

## 2016-01-04 MED ORDER — GADOBENATE DIMEGLUMINE 529 MG/ML IV SOLN
16.0000 mL | Freq: Once | INTRAVENOUS | Status: AC | PRN
  Administered 2016-01-04: 16 mL via INTRAVENOUS

## 2016-01-04 MED ORDER — NAFCILLIN SODIUM 2 G IJ SOLR
INTRAVENOUS | Status: AC
  Filled 2016-01-04 (×2): qty 2000

## 2016-01-04 MED ORDER — FAMOTIDINE IN NACL 20-0.9 MG/50ML-% IV SOLN
20.0000 mg | INTRAVENOUS | Status: DC
  Administered 2016-01-04 – 2016-01-05 (×2): 20 mg via INTRAVENOUS
  Filled 2016-01-04 (×4): qty 50

## 2016-01-04 NOTE — Plan of Care (Signed)
Problem: Physical Regulation: Goal: Ability to maintain clinical measurements within normal limits will improve Outcome: Not Progressing See flowsheet. Goal: Will remain free from infection Outcome: Progressing Pt started on new antibiotic (Nafcillin).  Problem: Skin Integrity: Goal: Risk for impaired skin integrity will decrease Outcome: Not Progressing Pt is refusing repositioning.  Problem: Tissue Perfusion: Goal: Risk factors for ineffective tissue perfusion will decrease Outcome: Progressing Heparin is her VTE

## 2016-01-04 NOTE — Progress Notes (Signed)
            [place] Antimicrobial Management Team Staphylococcus aureus bacteremia   Staphylococcus aureus bacteremia (SAB) is associated with a high rate of complications and mortality.  Specific aspects of clinical management are critical to optimizing the outcome of patients with SAB.  Therefore, the [facility] Antimicrobial Management Team [facility]) has initiated an intervention aimed at improving the management of SAB at [facility].  To do so, Infectious Diseases physicians are providing an evidence-based consult for the management of all patients with SAB.     Yes No Comments  Perform follow-up blood cultures (even if the patient is afebrile) to ensure clearance of bacteremia   Blood cultures ordered 2 days ago NGTD  Remove vascular catheter and obtain follow-up blood cultures after the removal of the catheter   , do not place such a line until we are at 72 hours negative blood cultures from blood drawn today    Perform echocardiography to evaluate for endocarditis (transthoracic ECHO is 40-50% sensitive, TEE is > 90% sensitive)   Please keep in mind, that neither test can definitively EXCLUDE endocarditis, and that should clinical suspicion remain high for endocarditis the patient should then still be treated with an "endocarditis" duration of therapy = 6 weeks  Given embolic events to CNS would consider TEE, though would she be an operative candidate for CVTS?  Otherwise would not change management    Consult electrophysiologist to evaluate implanted cardiac device (pacemaker, ICD)   NA  Ensure source control   Have all abscesses been drained effectively? Have deep seeded infections (septic joints or osteomyelitis) had appropriate  Surgicaltdebridement?  I think her FOOT is likey the source and MRI concerning for osteo though it was without contrast--not sure why pt refused it and also motion degraded  Podiatry has seen the patient and performed  debridement  Investigate for "metastatic" sites of infection   Does the patient have ANY symptom or physical exam finding that would suggest a deeper infection (back or neck pain that may be suggestive of vertebral osteomyelitis or epidural abscess, muscle pain that could be a symptom of pyomyositis)?  Keep in mind that for deep seeded infections MRI imaging with contrast is preferred rather than other often insensitive tests such as plain x-rays, especially early in a patient's presentation.  MRI brain showed embolic CVA  Consider TEE    Change antibiotic therapy to nafcillin  --given CNS emboli ikely from endocarditis    Beta-lactam antibiotics are preferred for MSSA due to higher cure rates.   If on Vancomycin, goal trough should be 15 - 20 mcg/mL  Estimated duration of IV antibiotic therapy:  8 weeks and may need protracted oral surgery if there is lingering concern for osteo in foot and this is not addressed surgically   Consult case management for probably prolonged outpatient IV antibiotic therapy   I think transfer to [facility] per Primary Team is appropriate for close Neuromonitoring  [facility] [phone] (pager) [phone] (office) 01/04/2016, 1:08 PM  Paulette Blanch Dam 01/04/2016, 1:08 PM

## 2016-01-04 NOTE — Progress Notes (Signed)
Podiatry Progress Note  Subjective Cassandra [name] is being followed for a chronic ulceration of her left foot.  Infectious disease note reviewed.  The patient is being transferred to [facility] later today.  Family is present at bedside.  Objective Vital signs in last 24 hours:   Temp:  [97.6 F (36.4 C)-98 F (36.7 C)] 98 F (36.7 C) (02/01 1300) Pulse Rate:  [96-115] 115 (02/01 1020) Resp:  [20-22] 22 (02/01 1300) BP: (167-185)/(86-104) 167/104 mmHg (02/01 1300) SpO2:  [94 %-98 %] 98 % (02/01 1300)  Left foot:  Dressing is clean, dry and intact.  Appearance of left foot is unchanged.  There are no ascending signs of cellulitis or lymphangitis.  Lab/Test Results  Recent Labs  01/03/16 0525 01/04/16 0542  WBC 20.0* 25.2*  HGB 12.0 12.7  HCT 34.6* 37.3  PLT 179 204  NA 133* 136  K 3.7 3.8  CL 106 106  CO2 20* 21*  BUN 37* 47*  CREATININE 0.91 1.05*  GLUCOSE 126* 129*  CALCIUM 8.6* 8.7*    Recent Results (from the past 240 hour(s))  C difficile quick scan w PCR reflex     Status: None   Collection Time: 12/30/15 12:28 AM  Result Value Ref Range Status   C Diff antigen NEGATIVE NEGATIVE Final   C Diff toxin NEGATIVE NEGATIVE Final   C Diff interpretation Negative for toxigenic C. difficile  Final    Comment: NEGATIVE  Culture, blood (routine x 2)     Status: None   Collection Time: 12/31/15 12:16 PM  Result Value Ref Range Status   Specimen Description BLOOD RIGHT ARM  Final   Special Requests BOTTLES DRAWN AEROBIC AND ANAEROBIC 6CC  Final   Culture  Setup Time   Final    GRAM POSITIVE COCCI IN CLUSTERS IN BOTH AEROBIC AND ANAEROBIC BOTTLES CRITICAL RESULT CALLED TO, READ BACK BY AND VERIFIED WITH: Carnella Guadalajara, J. AT 0035 ON 01/01/2016 BY AGUNDIZ,E.    Culture   Final    STAPHYLOCOCCUS AUREUS Performed at [facility]    Report Status 01/03/2016 FINAL  Final   Organism ID, Bacteria STAPHYLOCOCCUS AUREUS  Final      Susceptibility   Staphylococcus aureus -  MIC*    CIPROFLOXACIN <=0.5 SENSITIVE Sensitive     ERYTHROMYCIN <=0.25 SENSITIVE Sensitive     GENTAMICIN <=0.5 SENSITIVE Sensitive     OXACILLIN <=0.25 SENSITIVE Sensitive     TETRACYCLINE <=1 SENSITIVE Sensitive     VANCOMYCIN <=0.5 SENSITIVE Sensitive     TRIMETH/SULFA <=10 SENSITIVE Sensitive     CLINDAMYCIN <=0.25 SENSITIVE Sensitive     RIFAMPIN <=0.5 SENSITIVE Sensitive     Inducible Clindamycin NEGATIVE Sensitive     * STAPHYLOCOCCUS AUREUS  Culture, blood (routine x 2)     Status: None   Collection Time: 12/31/15 12:33 PM  Result Value Ref Range Status   Specimen Description BLOOD RIGHT ARM  Final   Special Requests BOTTLES DRAWN AEROBIC AND ANAEROBIC 6CC  Final   Culture  Setup Time   Final    GRAM POSITIVE COCCI IN CLUSTERS IN BOTH AEROBIC AND ANAEROBIC BOTTLES CRITICAL RESULT CALLED TO, READ BACK BY AND VERIFIED WITH: LAVINDER,J. AT 4098 ON 01/01/2016 BY AGUNDIZ,E.    Culture   Final    STAPHYLOCOCCUS AUREUS SUSCEPTIBILITIES PERFORMED ON PREVIOUS CULTURE WITHIN THE LAST 5 DAYS. Performed at [facility]    Report Status 01/03/2016 FINAL  Final  Culture, blood (Routine X 2) w Reflex  to ID Panel     Status: None (Preliminary result)   Collection Time: 01/02/16  2:11 PM  Result Value Ref Range Status   Specimen Description BLOOD RIGHT ANTECUBITAL  Final   Special Requests   Final    BOTTLES DRAWN AEROBIC AND ANAEROBIC 6CC EACH  IMMUNE:COMPROMISED   Culture NO GROWTH 2 DAYS  Final   Report Status PENDING  Incomplete  Culture, blood (Routine X 2) w Reflex to ID Panel     Status: None (Preliminary result)   Collection Time: 01/02/16  2:13 PM  Result Value Ref Range Status   Specimen Description BLOOD RIGHT HAND  Final   Special Requests   Final    BOTTLES DRAWN AEROBIC AND ANAEROBIC 6CC  IMMUNE:COMPROMISED   Culture NO GROWTH 2 DAYS  Final   Report Status PENDING  Incomplete  Wound culture     Status: None (Preliminary result)   Collection Time: 01/03/16   6:43 PM  Result Value Ref Range Status   Specimen Description WOUND LEFT FOOT  Final   Special Requests NONE  Final   Gram Stain   Final    NO WBC SEEN NO SQUAMOUS EPITHELIAL CELLS SEEN NO ORGANISMS SEEN Performed at [facility]    Culture NO GROWTH Performed at [facility]   Final   Report Status PENDING  Incomplete     Mr Cassandra [name] Contrast  01/04/2016  CLINICAL DATA:  82 year old female with altered mental status, not responding. Initial encounter. EXAM: MRI HEAD WITHOUT AND WITH CONTRAST TECHNIQUE: Multiplanar, multiecho pulse sequences of the brain and surrounding structures were obtained without and with intravenous contrast. CONTRAST:  16mL MULTIHANCE GADOBENATE DIMEGLUMINE 529 MG/ML IV SOLN COMPARISON:  Head CT without contrast 06/14/2014. Brain MRI, MRA a 03/13/2012. FINDINGS: About half a dozen punctate foci of restricted diffusion are demonstrated scattered in the bilateral hemispheres and superior cerebellum. One of these areas is in the medial right occipital lobe PCA territory where there is suggestion of mild underlying cortical encephalomalacia (series 102, image 19). Left MCA, bilateral PCA, and superior cerebellar artery territories are affected. No acute intracranial hemorrhage identified. No midline shift, mass effect, or evidence of intracranial mass lesion. Major intracranial vascular flow voids are stable. Minimal to mild generalized cerebral volume loss since 2013. No ventriculomegaly, extra-axial collection or acute intracranial hemorrhage. Cervicomedullary junction and pituitary are within normal limits. Negative visualized cervical spine. Patchy and scattered bilateral cerebral white matter T2 and FLAIR hyperintensity is stable. No abnormal enhancement identified. Visible internal auditory structures appear normal. Mild chronic right mastoid effusion is unchanged. Nasopharynx remains normal. Trace paranasal sinus mucosal thickening. Chronic postoperative  changes to both globes. Negative scalp soft tissues. IMPRESSION: 1. 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 Left MCA, bilateral PCA, and SCA territories are affected. Therefore, consider recent embolic event from the heart or proximal aorta (less likely posterior circulation only thromboembolism). 2. No associated hemorrhage or mass effect. 3. Otherwise no significant change in the MRI appearance of the brain since 2013. Electronically Signed   By: Odessa Fleming M.D.   On: 01/04/2016 09:49   Mr Foot Left [name] Contrast  01/03/2016  CLINICAL DATA:  Bacteremia. Open wounds on the left foot with history of amputation. EXAM: MRI OF THE LEFT FOREFOOT WITHOUT CONTRAST TECHNIQUE: Multiplanar, multisequence MR imaging was performed. No intravenous contrast was administered. COMPARISON:  01/02/2016; 09/15/2014 FINDINGS: The patient refused IV contrast. Despite efforts by the technologist and patient, severe  motion artifact is present on today's exam and could not be eliminated. This reduces exam sensitivity and specificity. First digit amputation at the MTP joint, with a approximately 1.5 by 0.8 by 0.8 cm fluid collection just plantar to the head of the first metatarsal as shown on images 27-32 of series 10, and surrounding abnormal inflammatory effacement of the adipose tissues. Abscess not excluded. Low signal intensity focus on image 33 series 9 possibly a tiny microscopic metallic particle or tiny focus of gas along the amputation margin. As on conventional radiographs, we demonstrate erosive findings in the head of the second and third metatarsals as well as in the bases of the proximal phalanges of the second and third toes. There is some low-level increased T2 signal along the third metatarsal head as on image 27 series 10 Low-level edema tracks along the plantar musculature of the foot. There is some mild subcutaneous edema dorsally in the foot. Degenerative findings along the  Lisfranc joint. No Lisfranc joint malalignment identified. IMPRESSION: 1. Erosive findings at the second and third metatarsophalangeal joints are confirmed. This could represent infection, gout, or rheumatoid arthropathy. Particularly in the head of the third metatarsal, osteomyelitis is of greater suspicion. Today's images are severely degraded by motion artifact, and the patient refused IV contrast. 2. Fluid collection plantar to the head of the first metatarsal -abscess not excluded given the inflammatory effacement of the surrounding fatty subcutaneous tissues. Electronically Signed   By: Gaylyn Rong M.D.   On: 01/03/2016 08:41    Medications Scheduled Meds: .  stroke: mapping our early stages of recovery book   Does not apply Once  . antiseptic oral rinse  7 mL Mouth Rinse BID  . aspirin  300 mg Rectal Daily  . brinzolamide  1 drop Both Eyes TID   And  . brimonidine  1 drop Both Eyes TID  . calcium-vitamin D  1 tablet Oral Daily  . famotidine (PEPCID) IV  20 mg Intravenous Q24H  . heparin  5,000 Units Subcutaneous 3 times per day  . latanoprost  1 drop Both Eyes QHS  . nafcillin IV  2 g Intravenous Q4H   Continuous Infusions: . sodium chloride     PRN Meds:.acetaminophen **OR** acetaminophen, albuterol, haloperidol lactate, morphine injection, ondansetron, polyvinyl alcohol  Assessment 1.  Staph aureus bacteremia 2.  Ulceration, left foot 3.  Peripheral neuropathy  Plan An Aquacel dressing was applied to the left foot.  Foot remains suspect as likely source of bacteremia.  Final culture results of foot wound is pending.  Gram stain did not reveal organism.  If foot remains suspect, I would consider I & D with bone biopsy when appropriate based on clinical progress.  Selso Mannor IVAN 01/04/2016, 2:22 PM

## 2016-01-04 NOTE — Progress Notes (Addendum)
SLP Cancellation Note  Patient Details Name: MALYN AYTES MRN: [id number] DOB: [birth year]   Cancelled treatment:       Reason Eval/Treat Not Completed: Pt currently being transferred to [facility] recommend ST consult at [facility]  Nicolette Gieske H. Romie Levee, CCC-SLP Speech Language Pathologist  Georgetta Haber 01/04/2016, 3:09 PM

## 2016-01-04 NOTE — Progress Notes (Signed)
Physical Therapy Treatment Patient Details Name: Cassandra [name] MRN: [id number] DOB: November 30, 1933 Today's Date: 01/04/2016    History of Present Illness 82yo white female who presents to [facility] on 12/29/15 after 2 days of N/V, R flank pain, and generalized weakness. Pt admitted for Gastroenteritis after workup. At baseline, pt reports she is a fully indep community ambulator without AD. PMH: HTN, HLD, and L hallux amputation 1ya. During past few days patient has been increasingly lethargic and somnolent. MRI findings triggered podiatry consult on 1/31, followed by debridement and new NWB status on LLE.     PT Comments    Treatment session initiated but pt's lethargy worsens during session eventually unable to follow simple commands. Pt tolerating treatment session well, initially with some severe back pain upon sitting up, which diffuses within 3 minutes. Pt unable to demonstrate significant progress toward goals as she remains lethargic and profoundly weak. She was able to tolerate sitting at EOB with heavy physical assistance, but fatigues quickly, only able to support self for short periods of time, then continuing to fall back and to the left without warning. Pt remains verbal and conversational but does not complete thought processes and often resolves to babbling or mumbling. Patient presenting with impairment of strength, pain, range of motion, balance, and activity tolerance, limiting ability to perform ADL and mobility tasks at  baseline level of function. Patient will benefit from skilled intervention to address the above impairments and limitations, in order to restore to prior level of function, improve patient safety upon discharge, and to decrease caregiver burden.    Follow Up Recommendations  SNF     Equipment Recommendations  None recommended by PT    Recommendations for Other Services       Precautions / Restrictions Precautions Precautions: None Restrictions Weight Bearing  Restrictions: Yes LLE Weight Bearing: Non weight bearing    Mobility  Bed Mobility Overal bed mobility: +2 for physical assistance;Needs Assistance Bed Mobility: Supine to Sit;Sit to Supine     Supine to sit: Total assist;+2 for physical assistance Sit to supine: Total assist;+2 for physical assistance   General bed mobility comments: +2 total for scoot.   Transfers Overall transfer level:  (not appropriate for transfers at this time. )               General transfer comment: profound weakness of trunk and hips, not following simple commands well at this time.   Ambulation/Gait                 Stairs            Wheelchair Mobility    Modified Rankin (Stroke Patients Only)       Balance     Sitting balance-Leahy Scale: Zero       Standing balance-Leahy Scale: Zero                      Cognition Arousal/Alertness: Lethargic Behavior During Therapy: Impulsive Overall Cognitive Status: Difficult to assess                      Exercises Other Exercises Other Exercises: Sitting upright on bedside with VC adn physical assistance for trunk strength and activity tolerance.  Other Exercises: LAQ 1x15 on LLE with ModA-MaxA    General Comments        Pertinent Vitals/Pain Pain Assessment: Faces Faces Pain Scale: Hurts whole lot Pain Location: Back pain, does not rate, but pain is  arresting.  Pain Intervention(s): Limited activity within patient's tolerance;Monitored during session;Repositioned    Home Living                      Prior Function            PT Goals (current goals can now be found in the care plan section) Acute Rehab PT Goals Patient Stated Goal: feel better, regain indep in mobility.  PT Goal Formulation: With patient Time For Goal Achievement: 01/13/16 Potential to Achieve Goals: Fair Progress towards PT goals: Not progressing toward goals - comment    Frequency  Min 3X/week    PT Plan  Current plan remains appropriate    Co-evaluation             End of Session   Activity Tolerance: Patient limited by lethargy Patient left: in bed;with call bell/phone within reach;with family/visitor present;with bed alarm set     Time: 1610-9604 PT Time Calculation (min) (ACUTE ONLY): 19 min  Charges:  $Therapeutic Activity: 8-22 mins                    G Codes:     10:41 AM, January 28, 2016 Rosamaria Lints, PT, DPT PRN Physical Therapist at [facility] [place] License # [id number] [phone] (wireless)  [phone] (mobile)

## 2016-01-04 NOTE — Progress Notes (Signed)
[facility] PROGRESS NOTE  Cassandra [name]:[id number] DOB: 1933-10-22 DOA: 12/29/2015 PCP: Colette Ribas, MD  Summary:   82 year old female who presented to the hospital with complaints of nausea, vomiting and abdominal pain. She was found to have a possible viral gastroenteritis and was treated supportively. During her hospital stay, she developed significant fevers. Blood cultures were sent and returned positive for MSSA bacteremia. He is not entirely clear. It may be related to the chronic wound on her foot. MRI of the foot could not rule out underlying osteoarthritis versus abscess. Podiatry has been consulted. Currently she is on intravenous antibiotics. Anticipate that she'll be in the hospital several more days. Eventual plan will be to discharge to skilled nursing facility.  Assessment/Plan: 1. MSSA bacteremia, revealed on 2/2 blood cultures. Currently on Nafcillin (switched from Cefazolin).  Previous 2D echo done that did not show evidence of vegetations however murmur noted on exam today. Discussed with ID who recommended a 6 week course of abx. Source of bacteremia not entirely clear at this time, questionable source of foot ulcer. Repeat blood cultures sent on 1/30. If cultures show no growth for 72 hours, then PICC line can be placed.  2. Acute encephalopathy/ AMS- MRI performed this morning showed Multiple (approximately 6) scattered punctate acute lacunar infarcts in the brain. Left MCA, bilateral PCA, and SCA territories are affected.  3. Chronic left foot wound . MRI of foot shows possible underlying osteomyelitis, cannot rule out underlying abscess. May be related to bacteremia. Podiatry consulted. 4. Nausea/Vomiting/diarrhea/abd pain. Pet patient granddaughter this is improved as patient has been stating less frequently she is in pain. Previous Abd/pelvis CT negative for acute findings. Continue symptomatic management and IVF.  Still having some CVA tenderness on  palpation 5. Elevated troponin. Resolved. Denies any chest pain at this time. May be demand ischemia in the setting of dehydration.   6. Dehydration, improving with IVF. 7. Hypokalemia, will replete.  8. Weakness, likely related to # 1, PT has consulted and recommended SNF upon discharge  9. HTN, BP elevated- ? Relation to emboli and perfusion deficits seen on MRI 10. HLD, continue statin 11. Glaucoma, continue home management   Code Status: Full DVT prophylaxis: Heparin Family Communication: granddaughter bedside and voices no questions Disposition Plan: transfer to [facility]   Consultants:  PT - SNF  Infectious Disease  Podiatry  Neurology  Cardiology  Procedures:  ECHO Study Conclusions- Left ventricle: The cavity size was normal. Systolic function was vigorous. The estimated ejection fraction was in the range of 65% to 70%. Wall motion was normal; there were no regional wall motion abnormalities. Doppler parameters are consistent with abnormal left ventricular relaxation (grade 1 diastolic dysfunction). There was no evidence of elevated ventricular filling pressure by Doppler parameters. - Aortic valve: Trileaflet; normal thickness leaflets. There was no regurgitation. - Aortic root: The aortic root was normal in size. - Mitral valve: Structurally normal valve. There was mild regurgitation. - Left atrium: The atrium was mildly dilated. - Right ventricle: Systolic function was normal. - Tricuspid valve: There was mild regurgitation. - Pulmonic valve: There was no regurgitation. - Pulmonary arteries: Systolic pressure was moderately increased. PA peak pressure: 48 mm Hg (S). - Inferior vena cava: The vessel was normal in size. - Pericardium, extracardiac: There was no pericardial effusion.  Antibiotics:  Vancomycin 1/29>>1/31  Cefazolin 1/29>> 2/1  Nafcillin 2/1>>  HPI/Subjective: Sleepy at time of exam- patient had just  returned from MRI.  Able to open  eyes on command, able to open mouth on command.  Patient has mostly unclear speech and mostly slurs.  Granddaughter unsure if patient had received sedating medication prior to MRI.  When patient awake she mentions her foot does not hurt but that her back hurts and gestures to mid thoracic spine. No other complaints.  Will need to await results of MRI  Objective: Filed Vitals:   01/04/16 0457 01/04/16 1020  BP: 177/94   Pulse: 103 115  Temp: 97.6 F (36.4 C)   Resp: 20     Intake/Output Summary (Last 24 hours) at 01/04/16 1347 Last data filed at 01/03/16 1730  Gross per 24 hour  Intake    240 ml  Output      0 ml  Net    240 ml   Filed Weights   12/29/15 0759 12/29/15 1957  Weight: 80.74 kg (178 lb) 81.2 kg (179 lb 0.2 oz)    Exam: General: NAD. Somewhat somnolent and confused  Cardiovascular: Regular rate and rhythm. I/VI systolic murmur heard best at aortic and pulmonic area  Respiratory: CTAB. No wheezes, rales, or rhonchi.   Abdomen: soft, non tender, no distention, bowel sounds normal Musculoskeletal: No edema b/l. S/p left greater toe amputated. Dressing intact over left great toe and forefoot.    Data Reviewed: Basic Metabolic Panel:  Recent Labs Lab 12/31/15 0546 01/01/16 1142 01/02/16 0534 01/03/16 0525 01/04/16 0542  NA 136 134* 136 133* 136  K 3.2* 3.5 2.7* 3.7 3.8  CL 105 104 104 106 106  CO2 24 20* 22 20* 21*  GLUCOSE 107* 114* 114* 126* 129*  BUN 22* 24* 30* 37* 47*  CREATININE 0.90 0.84 0.99 0.91 1.05*  CALCIUM 8.2* 8.6* 8.5* 8.6* 8.7*   Liver Function Tests:  Recent Labs Lab 12/29/15 0806 12/30/15 0501 12/31/15 0546  AST ALT ALKPHOS 79 71 78  BILITOT 1.5* 0.7 1.0  PROT 7.2 6.0* 6.0*  ALBUMIN 3.8 3.0* 2.7*    Recent Labs Lab 12/29/15 0806  LIPASE 15   CBC:  Recent Labs Lab 12/30/15 0501 12/31/15 0546 01/02/16 0534 01/03/16 0525 01/04/16 0542  WBC 10.2 8.8 13.9* 20.0*  25.2*  HGB 12.0 11.4* 11.9* 12.0 12.7  HCT 36.0 34.1* 34.9* 34.6* 37.3  MCV 92.1 91.4 89.7 88.7 88.8  PLT 133* 124* 159 179 204   Cardiac Enzymes:  Recent Labs Lab 12/30/15 0501 12/30/15 1218 12/30/15 1836 12/31/15 0059  TROPONINI 0.04* 0.04* 0.04* 0.03     Recent Results (from the past 240 hour(s))  C difficile quick scan w PCR reflex     Status: None   Collection Time: 12/30/15 12:28 AM  Result Value Ref Range Status   C Diff antigen NEGATIVE NEGATIVE Final   C Diff toxin NEGATIVE NEGATIVE Final   C Diff interpretation Negative for toxigenic C. difficile  Final    Comment: NEGATIVE  Culture, blood (routine x 2)     Status: None   Collection Time: 12/31/15 12:16 PM  Result Value Ref Range Status   Specimen Description BLOOD RIGHT ARM  Final   Special Requests BOTTLES DRAWN AEROBIC AND ANAEROBIC 6CC  Final   Culture  Setup Time   Final    GRAM POSITIVE COCCI IN CLUSTERS IN BOTH AEROBIC AND ANAEROBIC BOTTLES CRITICAL RESULT CALLED TO, READ BACK BY AND VERIFIED WITH: LAVINDER, J. AT 0035 ON 01/01/2016 BY AGUNDIZ,E.    Culture   Final    STAPHYLOCOCCUS AUREUS  Performed at [facility]    Report Status 01/03/2016 FINAL  Final   Organism ID, Bacteria STAPHYLOCOCCUS AUREUS  Final      Susceptibility   Staphylococcus aureus - MIC*    CIPROFLOXACIN <=0.5 SENSITIVE Sensitive     ERYTHROMYCIN <=0.25 SENSITIVE Sensitive     GENTAMICIN <=0.5 SENSITIVE Sensitive     OXACILLIN <=0.25 SENSITIVE Sensitive     TETRACYCLINE <=1 SENSITIVE Sensitive     VANCOMYCIN <=0.5 SENSITIVE Sensitive     TRIMETH/SULFA <=10 SENSITIVE Sensitive     CLINDAMYCIN <=0.25 SENSITIVE Sensitive     RIFAMPIN <=0.5 SENSITIVE Sensitive     Inducible Clindamycin NEGATIVE Sensitive     * STAPHYLOCOCCUS AUREUS  Culture, blood (routine x 2)     Status: None   Collection Time: 12/31/15 12:33 PM  Result Value Ref Range Status   Specimen Description BLOOD RIGHT ARM  Final   Special Requests BOTTLES  DRAWN AEROBIC AND ANAEROBIC 6CC  Final   Culture  Setup Time   Final    GRAM POSITIVE COCCI IN CLUSTERS IN BOTH AEROBIC AND ANAEROBIC BOTTLES CRITICAL RESULT CALLED TO, READ BACK BY AND VERIFIED WITH: LAVINDER,J. AT 1610 ON 01/01/2016 BY AGUNDIZ,E.    Culture   Final    STAPHYLOCOCCUS AUREUS SUSCEPTIBILITIES PERFORMED ON PREVIOUS CULTURE WITHIN THE LAST 5 DAYS. Performed at [facility]    Report Status 01/03/2016 FINAL  Final  Culture, blood (Routine X 2) w Reflex to ID Panel     Status: None (Preliminary result)   Collection Time: 01/02/16  2:11 PM  Result Value Ref Range Status   Specimen Description BLOOD RIGHT ANTECUBITAL  Final   Special Requests   Final    BOTTLES DRAWN AEROBIC AND ANAEROBIC 6CC EACH  IMMUNE:COMPROMISED   Culture NO GROWTH 2 DAYS  Final   Report Status PENDING  Incomplete  Culture, blood (Routine X 2) w Reflex to ID Panel     Status: None (Preliminary result)   Collection Time: 01/02/16  2:13 PM  Result Value Ref Range Status   Specimen Description BLOOD RIGHT HAND  Final   Special Requests   Final    BOTTLES DRAWN AEROBIC AND ANAEROBIC 6CC  IMMUNE:COMPROMISED   Culture NO GROWTH 2 DAYS  Final   Report Status PENDING  Incomplete  Wound culture     Status: None (Preliminary result)   Collection Time: 01/03/16  6:43 PM  Result Value Ref Range Status   Specimen Description WOUND LEFT FOOT  Final   Special Requests NONE  Final   Gram Stain   Final    NO WBC SEEN NO SQUAMOUS EPITHELIAL CELLS SEEN NO ORGANISMS SEEN Performed at [facility]    Culture NO GROWTH Performed at [facility]   Final   Report Status PENDING  Incomplete     Studies: [name] Contrast  Jan 31, 2016  CLINICAL DATA:  82 year old female with altered mental status, not responding. Initial encounter. EXAM: MRI HEAD WITHOUT AND WITH CONTRAST TECHNIQUE: Multiplanar, multiecho pulse sequences of the brain and surrounding structures were obtained without and  with intravenous contrast. CONTRAST:  16mL MULTIHANCE GADOBENATE DIMEGLUMINE 529 MG/ML IV SOLN COMPARISON:  Head CT without contrast 06/14/2014. Brain MRI, MRA a 03/13/2012. FINDINGS: About half a dozen punctate foci of restricted diffusion are demonstrated scattered in the bilateral hemispheres and superior cerebellum. One of these areas is in the medial right occipital lobe PCA territory where there is suggestion of mild underlying cortical  encephalomalacia (series 102, image 19). Left MCA, bilateral PCA, and superior cerebellar artery territories are affected. No acute intracranial hemorrhage identified. No midline shift, mass effect, or evidence of intracranial mass lesion. Major intracranial vascular flow voids are stable. Minimal to mild generalized cerebral volume loss since 2013. No ventriculomegaly, extra-axial collection or acute intracranial hemorrhage. Cervicomedullary junction and pituitary are within normal limits. Negative visualized cervical spine. Patchy and scattered bilateral cerebral white matter T2 and FLAIR hyperintensity is stable. No abnormal enhancement identified. Visible internal auditory structures appear normal. Mild chronic right mastoid effusion is unchanged. Nasopharynx remains normal. Trace paranasal sinus mucosal thickening. Chronic postoperative changes to both globes. Negative scalp soft tissues. IMPRESSION: 1. 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 Left MCA, bilateral PCA, and SCA territories are affected. Therefore, consider recent embolic event from the heart or proximal aorta (less likely posterior circulation only thromboembolism). 2. No associated hemorrhage or mass effect. 3. Otherwise no significant change in the MRI appearance of the brain since 2013. Electronically Signed   By: Odessa Fleming M.D.   On: 01/04/2016 09:49   Mr Foot Left Wo Contrast  01/03/2016  CLINICAL DATA:  Bacteremia. Open wounds on the left foot with history of  amputation. EXAM: MRI OF THE LEFT FOREFOOT WITHOUT CONTRAST TECHNIQUE: Multiplanar, multisequence MR imaging was performed. No intravenous contrast was administered. COMPARISON:  01/02/2016; 09/15/2014 FINDINGS: The patient refused IV contrast. Despite efforts by the technologist and patient, severe motion artifact is present on today's exam and could not be eliminated. This reduces exam sensitivity and specificity. First digit amputation at the MTP joint, with a approximately 1.5 by 0.8 by 0.8 cm fluid collection just plantar to the head of the first metatarsal as shown on images 27-32 of series 10, and surrounding abnormal inflammatory effacement of the adipose tissues. Abscess not excluded. Low signal intensity focus on image 33 series 9 possibly a tiny microscopic metallic particle or tiny focus of gas along the amputation margin. As on conventional radiographs, we demonstrate erosive findings in the head of the second and third metatarsals as well as in the bases of the proximal phalanges of the second and third toes. There is some low-level increased T2 signal along the third metatarsal head as on image 27 series 10 Low-level edema tracks along the plantar musculature of the foot. There is some mild subcutaneous edema dorsally in the foot. Degenerative findings along the Lisfranc joint. No Lisfranc joint malalignment identified. IMPRESSION: 1. Erosive findings at the second and third metatarsophalangeal joints are confirmed. This could represent infection, gout, or rheumatoid arthropathy. Particularly in the head of the third metatarsal, osteomyelitis is of greater suspicion. Today's images are severely degraded by motion artifact, and the patient refused IV contrast. 2. Fluid collection plantar to the head of the first metatarsal -abscess not excluded given the inflammatory effacement of the surrounding fatty subcutaneous tissues. Electronically Signed   By: Gaylyn Rong M.D.   On: 01/03/2016 08:41     Scheduled Meds: .  stroke: mapping our early stages of recovery book   Does not apply Once  . antiseptic oral rinse  7 mL Mouth Rinse BID  . aspirin  300 mg Rectal Daily  . brinzolamide  1 drop Both Eyes TID   And  . brimonidine  1 drop Both Eyes TID  . calcium-vitamin D  1 tablet Oral Daily  . famotidine (PEPCID) IV  20 mg Intravenous Q24H  . heparin  5,000 Units Subcutaneous 3 times  per day  . latanoprost  1 drop Both Eyes QHS  . nafcillin IV  2 g Intravenous Q4H   Continuous Infusions: . sodium chloride      Active Problems:   Hypertension   Hyperlipidemia   Gastroenteritis   Glaucoma   Staphylococcus aureus bacteremia   Time spent: 35 minutes   Reuben Likes MD  Resident Physician [facility] 01/04/2016, 1:47 PM  LOS: 3 days

## 2016-01-04 NOTE — Progress Notes (Signed)
            [place] Antimicrobial Management Team Staphylococcus aureus bacteremia   Staphylococcus aureus bacteremia (SAB) is associated with a high rate of complications and mortality.  Specific aspects of clinical management are critical to optimizing the outcome of patients with SAB.  Therefore, the [facility] Antimicrobial Management Team [facility]) has initiated an intervention aimed at improving the management of SAB at [facility].  To do so, Infectious Diseases physicians are providing an evidence-based consult for the management of all patients with SAB.     Yes No Comments  Perform follow-up blood cultures (even if the patient is afebrile) to ensure clearance of bacteremia   Blood cultures ordered yesterday  Remove vascular catheter and obtain follow-up blood cultures after the removal of the catheter   I am not aware of her having a central line or PICC line, do not place such a line until we are at 72 hours negative blood cultures from blood drawn today    Perform echocardiography to evaluate for endocarditis (transthoracic ECHO is 40-50% sensitive, TEE is > 90% sensitive)   Please keep in mind, that neither test can definitively EXCLUDE endocarditis, and that should clinical suspicion remain high for endocarditis the patient should then still be treated with an "endocarditis" duration of therapy = 6 weeks  Consult electrophysiologist to evaluate implanted cardiac device (pacemaker, ICD)   NA  Ensure source control   Have all abscesses been drained effectively? Have deep seeded infections (septic joints or osteomyelitis) had appropriate  Surgicaltdebridement?  I think her FOOT is likey the source and MRI concerning for osteo though it was without contrast--not sure why pt refused it and also motion degraded  I would make sure Orthopedic Surgery take a look at her foot where she had recent surgery as well  Investigate for "metastatic" sites of infection    Does the patient have ANY symptom or physical exam finding that would suggest a deeper infection (back or neck pain that may be suggestive of vertebral osteomyelitis or epidural abscess, muscle pain that could be a symptom of pyomyositis)?  Keep in mind that for deep seeded infections MRI imaging with contrast is preferred rather than other often insensitive tests such as plain x-rays, especially early in a patient's presentation.  See above She apparently has worsening confusion now. Agree with MRI and having CNS penetrating antibiotics  Consider TEE    Change antibiotic therapy to oxacillin --given some concern for CNS emboli from endocarditis and    Beta-lactam antibiotics are preferred for MSSA due to higher cure rates.   If on Vancomycin, goal trough should be 15 - 20 mcg/mL  Estimated duration of IV antibiotic therapy:  8 weeks and may need protracted oral surgery if there is lingering concern for osteo and this is not addressed surgically   Consult case management for probably prolonged outpatient IV antibiotic therapy    [facility] [phone] (pager) [phone] (office) 01/04/2016, 12:05 AM  Paulette Blanch Dam 01/04/2016, 12:05 AM

## 2016-01-04 NOTE — Progress Notes (Signed)
OT Cancellation Note  Patient Details Name: Cassandra [name] MRN: [id number] DOB: 03-Mar-1933   Cancelled Treatment:     Reason evaluation not completed: Chart reviewed: Most recent MD note (2/1 13:12) indicates pt is to be transferred to [facility] due to multiple strokes. Will hold OT evaluation and keep on [facility] OT caseload for now, in the event pt does not transfer; otherwise defer OT evaluation to [facility] acute rehab.   Ezra Sites, OTR/L  [phone]  01/04/2016, 1:41 PM

## 2016-01-05 ENCOUNTER — Inpatient Hospital Stay (HOSPITAL_COMMUNITY)
Admit: 2016-01-05 | Discharge: 2016-01-05 | Disposition: A | Discharge status: Home / Self Care | Payer: Medicare Other | Attending: Cardiology | Admitting: Cardiology

## 2016-01-05 ENCOUNTER — Encounter (HOSPITAL_COMMUNITY): Payer: Self-pay | Admitting: *Deleted

## 2016-01-05 ENCOUNTER — Encounter (HOSPITAL_COMMUNITY)
Admission: EM | Disposition: A | Discharge status: Skilled Nursing Facility | Payer: Self-pay | Source: Home / Self Care | Attending: Internal Medicine

## 2016-01-05 DIAGNOSIS — R400 Somnolence: Secondary | ICD-10-CM

## 2016-01-05 DIAGNOSIS — L97511 Non-pressure chronic ulcer of other part of right foot limited to breakdown of skin: Secondary | ICD-10-CM

## 2016-01-05 DIAGNOSIS — A4101 Sepsis due to Methicillin susceptible Staphylococcus aureus: Principal | ICD-10-CM

## 2016-01-05 DIAGNOSIS — I2690 Septic pulmonary embolism without acute cor pulmonale: Secondary | ICD-10-CM

## 2016-01-05 DIAGNOSIS — R7881 Bacteremia: Secondary | ICD-10-CM | POA: Insufficient documentation

## 2016-01-05 DIAGNOSIS — I6350 Cerebral infarction due to unspecified occlusion or stenosis of unspecified cerebral artery: Secondary | ICD-10-CM

## 2016-01-05 DIAGNOSIS — A419 Sepsis, unspecified organism: Secondary | ICD-10-CM

## 2016-01-05 DIAGNOSIS — I63411 Cerebral infarction due to embolism of right middle cerebral artery: Secondary | ICD-10-CM

## 2016-01-05 DIAGNOSIS — I340 Nonrheumatic mitral (valve) insufficiency: Secondary | ICD-10-CM

## 2016-01-05 HISTORY — PX: TEE WITHOUT CARDIOVERSION: SHX5443

## 2016-01-05 SURGERY — ECHOCARDIOGRAM, TRANSESOPHAGEAL
Anesthesia: Moderate Sedation

## 2016-01-05 MED ORDER — FENTANYL CITRATE (PF) 100 MCG/2ML IJ SOLN
INTRAMUSCULAR | Status: AC
  Filled 2016-01-05: qty 2

## 2016-01-05 MED ORDER — SODIUM CHLORIDE 0.9 % IV SOLN: INTRAVENOUS | Status: DC

## 2016-01-05 MED ORDER — MIDAZOLAM HCL 5 MG/ML IJ SOLN
INTRAMUSCULAR | Status: AC
  Filled 2016-01-05: qty 1

## 2016-01-05 MED ORDER — BUTAMBEN-TETRACAINE-BENZOCAINE 2-2-14 % EX AERO
INHALATION_SPRAY | CUTANEOUS | Status: DC | PRN
  Administered 2016-01-05: 2 via TOPICAL

## 2016-01-05 MED ORDER — FENTANYL CITRATE (PF) 100 MCG/2ML IJ SOLN
INTRAMUSCULAR | Status: DC | PRN
  Administered 2016-01-05: 25 ug via INTRAVENOUS

## 2016-01-05 MED ORDER — MIDAZOLAM HCL 10 MG/2ML IJ SOLN
INTRAMUSCULAR | Status: DC | PRN
  Administered 2016-01-05: 2 mg via INTRAVENOUS
  Administered 2016-01-05: 1 mg via INTRAVENOUS

## 2016-01-05 MED ORDER — AMLODIPINE BESYLATE 5 MG PO TABS
5.0000 mg | ORAL_TABLET | Freq: Every day | ORAL | Status: DC
Start: 2016-01-05 — End: 2016-01-06
  Administered 2016-01-05 – 2016-01-06 (×2): 5 mg via ORAL
  Filled 2016-01-05 (×3): qty 1

## 2016-01-05 NOTE — Evaluation (Signed)
Occupational Therapy Evaluation Patient Details Name: Cassandra [name] MRN: [id number] DOB: [birth year] Today's Date: 01/05/2016    History of Present Illness 82yo white female who presents to [facility] on 12/29/15 after 2 days of N/V, R flank pain, and generalized weakness. Pt admitted for Gastroenteritis after workup. At baseline, pt reports she is a fully indep community ambulator without AD. PMH: HTN, HLD, and L hallux amputation 1ya. During past few days patient has been increasingly lethargic and somnolent. MRI findings triggered podiatry consult on 1/31, followed by debridement and new NWB status on LLE. MRI also performed secondary to acute encephlaopathy/AMS and revealed multiple acattered punctuate acute lunar infarcts. Pt was transferred from [facility] on 01/04/16.   Clinical Impression   Pt currently demonstrates significant deficits in ability to perform functional mobillity and transfers related to ADL's. She is currently +2 Mod A/physical assist for bed mobility and attempted sit to stand SPT to chair x3  Attempted sit to stand/SPT x3 w/ and w/o RW - pt noted to "sit" when transfers were initiated also pushing back into posterior lean. OT/PT assisted pt back to bed. She will benefit from acute OT followed by in-pt Rehab as she was living alone and independent prior to this hospitialzation, pt drives. Rec CIR consult.    Follow Up Recommendations  CIR;Supervision/Assistance - 24 hour    Equipment Recommendations  Other (comment) (Defer to next venue)    Recommendations for Other Services Rehab consult     Precautions / Restrictions Precautions Precautions: Fall;Other (comment) Precaution Comments: L foot infection/NWB Restrictions Weight Bearing Restrictions: Yes LLE Weight Bearing: Non weight bearing      Mobility Bed Mobility Overal bed mobility: +2 for physical assistance;Needs Assistance Bed Mobility: Rolling;Sidelying to Sit;Sit to Supine Rolling: Min assist;+2  for physical assistance;+2 for safety/equipment Sidelying to sit: Mod assist;+2 for physical assistance;+2 for safety/equipment;HOB elevated   Sit to supine: +2 for physical assistance;+2 for safety/equipment;Max assist   General bed mobility comments:  +2 with verbal and tactile cues. Pt appears to have difficulty with some comprehension/impaired cognition. Cont to assess in functional context  Transfers Overall transfer level:  (Not appropriate at this time for transfers)               General transfer comment: Pt currently unable to perform SPT w/ +2 total physical assist. Attempted x3 w/ and w/o RW - pt noted to "sit" when transfers were initiated also pushing back into posterior lean given OT/PT assist.    Balance Overall balance assessment: Needs assistance Sitting-balance support: Bilateral upper extremity supported Sitting balance-Leahy Scale: Poor   Postural control: Posterior lean                                  ADL Overall ADL's : Needs assistance/impaired     Grooming: Wash/dry hands;Set up;Bed level   Upper Body Bathing: Maximal assistance;Bed level;Cueing for sequencing   Lower Body Bathing: Maximal assistance;Bed level;Cueing for sequencing;Cueing for safety   Upper Body Dressing : Moderate assistance;Bed level;Cueing for sequencing;Cueing for safety   Lower Body Dressing: Total assistance;Bed level;+2 for physical assistance;Cueing for safety;Cueing for sequencing     Toilet Transfer Details (indicate cue type and reason): Pt currently unable to perform SPT w/ +2 physical assist. Attempted x3 w/ and w/o RW - pt noted to "sit" when transfers were initiated also pushing back into posterior lean. Toileting- Clothing Manipulation and Hygiene: Total assistance;+2 for  physical assistance;+2 for safety/equipment;Bed level       Functional mobility during ADLs:  (Pt unable to perform safe transfer Pt currently unable to perform SPT w/ +2 physical  assist. Attempted x3 w/ and w/o RW - pt noted to "sit" when transfers were initiated also pushing back into posterior lean.) General ADL Comments: Pt currently demonstrates significant deficits in ability to perform functional mobillity and transfers related to ADL's. She is currently +2 Mod A/physical assist for bed mobility and attempted sit to stand for SPT to chair x3 Pt currently unable to perform SPT w/ +2 physical assist. Attempted x3 w/ and w/o RW - pt noted to "sit" when transfers were initiated also pushing back into posterior lean. OT/PT assisted pt back to bed. She will benefit from acute OT followed by in-pt Rehab  as she was living alone and independent  prior to this hospitialization.     Vision Vision Assessment?:  (Cont to assess in functional context as pt had blurred vision yesterday)   Perception     Praxis      Pertinent Vitals/Pain Pain Score: 6  Faces Pain Scale: Hurts even more Pain Location: back pain Pain Descriptors / Indicators: Grimacing;Guarding;Discomfort Pain Intervention(s): Limited activity within patient's tolerance;Monitored during session;Repositioned     Hand Dominance Right   Extremity/Trunk Assessment Upper Extremity Assessment Upper Extremity Assessment: Generalized weakness;RUE deficits/detail;LUE deficits/detail (Pt with AROM WFL's bilaterally, impired gross/fine motor contorl - difficulty following commands for MMT) RUE Coordination: decreased gross motor;decreased fine motor LUE Coordination: decreased fine motor;decreased gross motor   Lower Extremity Assessment Lower Extremity Assessment: Defer to PT evaluation       Communication Communication Communication: No difficulties   Cognition Arousal/Alertness: Lethargic;Awake/alert Behavior During Therapy: Anxious;Flat affect;Agitated Overall Cognitive Status: Impaired/Different from baseline Area of Impairment: Safety/judgement;Memory;Attention;Problem solving     Memory: Decreased  short-term memory Following Commands: Follows one step commands inconsistently Safety/Judgement: Decreased awareness of deficits;Decreased awareness of safety   Problem Solving: Slow processing;Decreased initiation;Difficulty sequencing;Requires verbal cues;Requires tactile cues     General Comments       Exercises       Shoulder Instructions      Home Living Family/patient expects to be discharged to:: Private residence Living Arrangements: Spouse/significant other Available Help at Discharge: Family Type of Home: House Home Access: Stairs to enter [employer] of Steps: 5 steps on front porch c 1 handrail  Entrance Stairs-Rails: Left Home Layout: Laundry or work area in basement;Two level Alternate Level Stairs-Number of Steps: Washer/dryer  in basement with full flight down. Pt was doing her own laundry prior to this hospitialization per her grand daughters report   Bathroom Shower/Tub: Tub/shower unit Shower/tub characteristics: Curtain Firefighter: Standard     Home Equipment: Environmental consultant - 2 wheels   Additional Comments: Tub shower      Prior Functioning/Environment Level of Independence: Independent        Comments: Pt does all ADL's, functional mobility w/o AD and drives prior to this hospitialization.    OT Diagnosis: Generalized weakness;Cognitive deficits;Altered mental status   OT Problem List: Decreased strength;Decreased activity tolerance;Impaired balance (sitting and/or standing);Decreased knowledge of precautions;Decreased knowledge of use of DME or AE;Decreased safety awareness;Decreased cognition;Pain   OT Treatment/Interventions: Self-care/ADL training;Therapeutic exercise;Neuromuscular education;DME and/or AE instruction;Patient/family education;Cognitive remediation/compensation;Therapeutic activities;Balance training    OT Goals(Current goals can be found in the care plan section) Acute Rehab OT Goals Patient Stated Goal: Feel better,  eat something OT Goal Formulation: Patient unable to participate in goal  setting Time For Goal Achievement: 01/19/16 Potential to Achieve Goals: Good  OT Frequency: Min 2X/week   Barriers to D/C: Other (comment)  Lives alone was independent PTA, drives. Family in and out       Co-evaluation PT/OT/SLP Co-Evaluation/Treatment: Yes Reason for Co-Treatment: Complexity of the patient's impairments (multi-system involvement);For patient/therapist safety   OT goals addressed during session: ADL's and self-care;Other (comment) (Functional transfers)      End of Session Equipment Utilized During Treatment: Gait belt;Rolling walker Nurse Communication: Mobility status;Weight bearing status;Other (comment) (Recommendation for CIR consult)  Activity Tolerance: Patient limited by fatigue;Patient limited by lethargy Patient left: in bed;with call bell/phone within reach   Time: [phone] (then Completed Eval w/ PT 11:40-12:01 = total) OT Time Calculation (min): 24 + 21 min = 45 min total for assessment Charges:  OT General Charges $OT Visit: 1 Procedure OT Evaluation $OT Eval High Complexity: 1 Procedure OT Treatments $Self Care/Home Management : 8-22 mins G-Codes:    Alm Bustard, OTR/L 01/05/2016, 12:36 PM

## 2016-01-05 NOTE — Progress Notes (Signed)
  Echocardiogram Echocardiogram Transesophageal has been performed.  Delcie Roch 01/05/2016, 3:00 PM

## 2016-01-05 NOTE — Progress Notes (Signed)
  I informed patient of the indication for TEE, which is scheduled today at [facility]. I discussed procedural details including potential risk. All questions and concerns were addressed. The patient agrees to proceed. Orders have been placed. RN to obtain formal written consent.   Cassandra [name] 01/05/2016

## 2016-01-05 NOTE — Interval H&P Note (Signed)
History and Physical Interval Note:  01/05/2016 2:07 PM  Cassandra [name]  has presented today for surgery, with the diagnosis of vegetation  The various methods of treatment have been discussed with the patient and family. After consideration of risks, benefits and other options for treatment, the patient has consented to  Procedure(s): TRANSESOPHAGEAL ECHOCARDIOGRAM (TEE) (N/A) as a surgical intervention .  The patient's history has been reviewed, patient examined, no change in status, stable for surgery.  I have reviewed the patient's chart and labs.  Questions were answered to the patient's satisfaction.     Kaylia Winborne

## 2016-01-05 NOTE — Evaluation (Signed)
Clinical/Bedside Swallow Evaluation Patient Details  Name: Cassandra [name] MRN: [id number] Date of Birth: 04/03/1933  Today's Date: 01/05/2016 Time:        Past Medical History:  Past Medical History  Diagnosis Date  . Hypertension   . Arthritis   . Depression   . Hyperlipidemia   . PFO (patent foramen ovale)     small  . H/O echocardiogram 2008    prolapse of post. mitral leaflet, tr MR, tr TR, EF >55%, mild concentric LVH  . H/O cardiovascular stress test 10/13/2007    low risk scan, no changes from previous test  . Heart palpitations     controlled on BB  . Hiatal hernia   . Cellulitis and abscess of toe of left foot 09/16/2014   Past Surgical History:  Past Surgical History  Procedure Laterality Date  . Cholecystectomy    . Cardiac catheterization  10/2002    normal coronary arteries, normal LV function  . Incision and drainage Right 07/21/2014    Procedure: INCISION AND DRAINAGE;  Surgeon: Dallas Schimke, DPM;  Location: [facility];  Service: Podiatry;  Laterality: Right;  . Incision and drainage Left 09/17/2014    Procedure: INCISION AND DRAINAGE 1ST TOE LEFT FOOT;  Surgeon: Dallas Schimke, DPM;  Location: [facility];  Service: Podiatry;  Laterality: Left;  . Bone biopsy Left 09/17/2014    Procedure: BONE BIOPSY;  Surgeon: Dallas Schimke, DPM;  Location: [facility];  Service: Podiatry;  Laterality: Left;  . Amputation Left 09/20/2014    Procedure: AMPUTATION DIGIT (LEFT GREAT TOE);  Surgeon: Dallas Schimke, DPM;  Location: [facility];  Service: Podiatry;  Laterality: Left;   HPI:  82 y.o. female with h/o HTN, arthritis, hyperlipidemia, left leg chronic ulcer. Pt presented with c/o generalized weakness, right flank pain, poor appetite, fever, nausea, vomiting, diarrhea over 48 hour period, indicative of gastroenteritis, dehyrdation. MRI Brain 2/1 multiple (~6) scattered punctuate acute lacunar infarcts in brain, possible recent embolic event from heart or  proximal aorta. Left MCA, bilateral PCA and SCA are affected. Vascular congestion with bilateral perihilar and lower lobe opacities, right greater than left, asymmetric edema or infection.   Assessment / Plan / Recommendation Clinical Impression  SLP provided skilled observation of POs at bedside to assess swallow function. Reduced lingual ROM and strength, but appeared functional to control bolus.  Immediate coughing x1 after consecutive sips of thin liquids via straw, however further coughing episodes prevented with removal of straw. Pt educated re: diet recommendation. Recommend regular diet with thin liquids and no straws, meds whole in applesauce, full assist due to R side weakness. SLP to f/u to determine diet toleration. Observed anomia; requesting SLE order.    Aspiration Risk  Mild aspiration risk    Diet Recommendation Regular;Thin liquid   Medication Administration: Whole meds with puree Supervision: Patient able to self feed;Staff to assist with self feeding;Full supervision/cueing for compensatory strategies Compensations: Minimize environmental distractions;Slow rate;Small sips/bites    Other  Recommendations Oral Care Recommendations: Oral care BID   Follow up Recommendations   (TBD)    Frequency and Duration min 1 x/week  1 week       Prognosis Prognosis for Safe Diet Advancement: Good Barriers to Reach Goals: Language deficits      Swallow Study   General HPI: 82 y.o. female with h/o HTN, arthritis, hyperlipidemia, left leg chronic ulcer. Pt presented with c/o generalized weakness, right flank pain, poor appetite, fever, nausea, vomiting, diarrhea over 48  hour period, indicative of gastroenteritis, dehyrdation. MRI Brain 2/1 multiple (~6) scattered punctuate acute lacunar infarcts in brain, possible recent embolic event from heart or proximal aorta. Left MCA, bilateral PCA and SCA are affected. Vascular congestion with bilateral perihilar and lower lobe opacities, right  greater than left, asymmetric edema or infection. Previous Swallow Assessment: none Respiratory Status: Room air History of Recent Intubation: No Behavior/Cognition: Alert;Cooperative;Pleasant mood Oral Care Completed by SLP: No Oral Cavity - Dentition: Adequate natural dentition Vision: Functional for self-feeding Baseline Vocal Quality: Normal Volitional Cough: Weak Volitional Swallow: Able to elicit    Oral/Motor/Sensory Function Overall Oral Motor/Sensory Function: Mild impairment Facial ROM: Within Functional Limits Facial Symmetry: Within Functional Limits Facial Strength: Reduced right;Reduced left Facial Sensation: Within Functional Limits Lingual ROM: Reduced right;Reduced left Lingual Symmetry: Within Functional Limits Lingual Strength: Within Functional Limits Lingual Sensation: Within Functional Limits Velum: Within Functional Limits Mandible: Within Functional Limits   Ice Chips Ice chips: Not tested   Thin Liquid Thin Liquid: Impaired Presentation: Cup;Self Fed;Straw Pharyngeal  Phase Impairments: Cough - Immediate    Nectar Thick Nectar Thick Liquid: Not tested   Honey Thick Honey Thick Liquid: Not tested   Puree Puree: Within functional limits Presentation: Self Fed;Spoon   Solid   GO   Solid: Within functional limits Presentation: Self Fed        Cassandra [name] 01/05/2016,12:22 PM   Cassandra [name], Student-SLP

## 2016-01-05 NOTE — Op Note (Signed)
INDICATIONS: infective endocarditis  PROCEDURE:   Informed consent was obtained prior to the procedure. The risks, benefits and alternatives for the procedure were discussed and the patient comprehended these risks.  Risks include, but are not limited to, cough, sore throat, vomiting, nausea, somnolence, esophageal and stomach trauma or perforation, bleeding, low blood pressure, aspiration, pneumonia, infection, trauma to the teeth and death.    After a procedural time-out, the oropharynx was anesthetized with 20% benzocaine spray. The patient was given 3 mg versed and 25 mcg fentanyl for moderate sedation.   The transesophageal probe was inserted in the esophagus and stomach without difficulty and multiple views were obtained.  The patient was kept under observation until the patient left the procedure room.  The patient left the procedure room in stable condition.   Agitated microbubble saline contrast was not administered.  COMPLICATIONS:    There were no immediate complications.  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 Moderate MR. Moderate TR. Normal LV function. Otherwise normal study.  RECOMMENDATIONS:   IV antibiotics for endocarditis.  Time Spent Directly with the Patient:  30 minutes   Nassir Neidert 01/05/2016, 2:57 PM

## 2016-01-05 NOTE — Consult Note (Signed)
Cassandra [name] for Infectious Disease  Total days of antibiotics 5        Day 3 nafcillin               Reason for Consult: MSSA endocarditis c/b septic CNS emboli, and right foot osteomyelitis    Referring Physician: zamora  Principal Problem:   Sepsis ([place]) Active Problems:   Hypertension   Hyperlipidemia   Gastroenteritis   Glaucoma   Staphylococcus aureus bacteremia   Diminished pulses in lower extremity   Emesis    HPI: Cassandra [name] is a 82 y.o. female with history of htn, hld, chronic left foot ulcer s/p 1st digit amputation who was admitted on 1/26 for generalized weakness,fevers nausea, vomiting, diarrhea, x 48 hours. She was originally admitted for dehydration at [facility]. On HD #2 started to have fevers and found to have MSSA bacteremia. She was started on anti-staphylococcal treatment and undergoing work up for source of infection, possibly left foot ulcer. She was noted to be increasingly  confused and with R sided weakness. On 2/1, she underwent MRI brain which described multiple punctate lacunar infarcts worrisome for septic emboli. Her antibiotics changed to Nafcillin for CNS penetration. She underwent TEE today which showed a very large, hypermobile mitral valve vegetation. MRI of her left foot showed small fluid collection that possbly could be abscess. She remains delirious. hpi obtained from chart review and her granddaughter since patient does not reliably answer questions  Past Medical History  Diagnosis Date  . Hypertension   . Arthritis   . Depression   . Hyperlipidemia   . PFO (patent foramen ovale)     small  . H/O echocardiogram 2008    prolapse of post. mitral leaflet, tr MR, tr TR, EF >55%, mild concentric LVH  . H/O cardiovascular stress test 10/13/2007    low risk scan, no changes from previous test  . Heart palpitations     controlled on BB  . Hiatal hernia   . Cellulitis and abscess of toe of left foot 09/16/2014    Allergies:  Allergies    Allergen Reactions  . Codeine Shortness Of Breath and Nausea And Vomiting  . Hydrocodone Shortness Of Breath and Nausea And Vomiting   MEDICATIONS: .  stroke: mapping our early stages of recovery book   Does not apply Once  . amLODipine  5 mg Oral Daily  . antiseptic oral rinse  7 mL Mouth Rinse BID  . aspirin  300 mg Rectal Daily  . brinzolamide  1 drop Both Eyes TID   And  . brimonidine  1 drop Both Eyes TID  . calcium-vitamin D  1 tablet Oral Daily  . famotidine (PEPCID) IV  20 mg Intravenous Q24H  . heparin  5,000 Units Subcutaneous 3 times per day  . latanoprost  1 drop Both Eyes QHS  . nafcillin IV  2 g Intravenous Q4H  . nystatin  5 mL Oral QID    Social History  Substance Use Topics  . Smoking status: Never Smoker   . Smokeless tobacco: Never Used  . Alcohol Use: No    Family History  Problem Relation Age of Onset  . Heart disease Mother   . Cancer Father   . Kidney disease Brother   . Hypertension Daughter   . Hyperlipidemia Son     Review of Systems -  Unable to obtain due to encephalopathy  OBJECTIVE: Temp:  [97.9 F (36.6 C)-98.2 F (36.8 C)] 98.1 F (36.7  C) (02/02 1254) Pulse Rate:  [87-107] 93 (02/02 1530) Resp:  [14-21] 16 (02/02 1530) BP: (156-203)/(73-144) 156/74 mmHg (02/02 1534) SpO2:  [92 %-98 %] 92 % (02/02 1530) Physical Exam  Constitutional:  oriented to person, only. appears well-developed and well-nourished. No distress.  HENT: [place]/AT, PERRLA, no scleral icterus Mouth/Throat: Oropharynx is clear and moist. No oropharyngeal exudate.  Cardiovascular: Normal rate, regular rhythm and normal heart sounds. 2/6 murmur BH'@LUSB'  and RUSB Pulmonary/Chest: Effort normal and breath sounds normal. No respiratory distress.  has no wheezes.  Neck = supple, no nuchal rigidity Abdominal: Soft. Bowel sounds are normal.  exhibits no distension. There is no tenderness.  Lymphadenopathy: no cervical adenopathy. No axillary adenopathy Neurological: alert  and oriented to person, moves all extremities Skin: Skin is warm and dry. No rash noted. No erythema.  Psychiatric: talking to herself  LABS: Results for orders placed or performed during the hospital encounter of 12/29/15 (from the past 48 hour(s))  Basic metabolic panel     Status: Abnormal   Collection Time: 01/04/16  5:42 AM  Result Value Ref Range   Sodium 136 135 - 145 mmol/L   Potassium 3.8 3.5 - 5.1 mmol/L   Chloride 106 101 - 111 mmol/L   CO2 21 (L) 22 - 32 mmol/L   Glucose, Bld 129 (H) 65 - 99 mg/dL   BUN 47 (H) 6 - 20 mg/dL   Creatinine, Ser 1.05 (H) 0.44 - 1.00 mg/dL   Calcium 8.7 (L) 8.9 - 10.3 mg/dL   GFR calc non Af Amer 49 (L) >60 mL/min   GFR calc Af Amer 57 (L) >60 mL/min    Comment: (NOTE) The eGFR has been calculated using the CKD EPI equation. This calculation has not been validated in all clinical situations. eGFR's persistently <60 mL/min signify possible Chronic Kidney Disease.    Anion gap 9 5 - 15  CBC     Status: Abnormal   Collection Time: 01/04/16  5:42 AM  Result Value Ref Range   WBC 25.2 (H) 4.0 - 10.5 K/uL   RBC 4.20 3.87 - 5.11 MIL/uL   Hemoglobin 12.7 12.0 - 15.0 g/dL   HCT 37.3 36.0 - 46.0 %   MCV 88.8 78.0 - 100.0 fL   MCH 30.2 26.0 - 34.0 pg   MCHC 34.0 30.0 - 36.0 g/dL   RDW 13.9 11.5 - 15.5 %   Platelets 204 150 - 400 K/uL    MICRO: 1/31 wound cx MSSA 1/30 blood cx NGTD 1/28 blood cx MSSA x 2 IMAGING: Mr Kizzie Fantasia Contrast  01/04/2016  CLINICAL DATA:  82 year old female with altered mental status, not responding. Initial encounter. EXAM: MRI HEAD WITHOUT AND WITH CONTRAST TECHNIQUE: Multiplanar, multiecho pulse sequences of the brain and surrounding structures were obtained without and with intravenous contrast. CONTRAST:  61m MULTIHANCE GADOBENATE DIMEGLUMINE 529 MG/ML IV SOLN COMPARISON:  Head CT without contrast 06/14/2014. Brain MRI, MRA a 03/13/2012. FINDINGS: About half a dozen punctate foci of restricted diffusion are  demonstrated scattered in the bilateral hemispheres and superior cerebellum. One of these areas is in the medial right occipital lobe PCA territory where there is suggestion of mild underlying cortical encephalomalacia (series 102, image 19). Left MCA, bilateral PCA, and superior cerebellar artery territories are affected. No acute intracranial hemorrhage identified. No midline shift, mass effect, or evidence of intracranial mass lesion. Major intracranial vascular flow voids are stable. Minimal to mild generalized cerebral volume loss since 2013. No ventriculomegaly, extra-axial collection or acute  intracranial hemorrhage. Cervicomedullary junction and pituitary are within normal limits. Negative visualized cervical spine. Patchy and scattered bilateral cerebral white matter T2 and FLAIR hyperintensity is stable. No abnormal enhancement identified. Visible internal auditory structures appear normal. Mild chronic right mastoid effusion is unchanged. Nasopharynx remains normal. Trace paranasal sinus mucosal thickening. Chronic postoperative changes to both globes. Negative scalp soft tissues. IMPRESSION: 1. 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 Left MCA, bilateral PCA, and SCA territories are affected. Therefore, consider recent embolic event from the heart or proximal aorta (less likely posterior circulation only thromboembolism). 2. No associated hemorrhage or mass effect. 3. Otherwise no significant change in the MRI appearance of the brain since 2013. Electronically Signed   By: Genevie Ann M.D.   On: 01/04/2016 09:49   MRI of Foot:FINDINGS:  First digit amputation at the MTP joint, with a approximately 1.5 by 0.8 by 0.8 cm fluid collection just plantar to the head of the first metatarsal as shown on images 27-32 of series 10, and surrounding abnormal inflammatory effacement of the adipose tissues. Abscess not excluded. Low signal intensity focus on image 33 series  9 possibly a tiny microscopic metallic particle or tiny focus of gas along the amputation margin.  As on conventional radiographs, we demonstrate erosive findings in the head of the second and third metatarsals as well as in the bases of the proximal phalanges of the second and third toes. There is some low-level increased T2 signal along the third metatarsal head as on image 27 series 10  Low-level edema tracks along the plantar musculature of the foot. There is some mild subcutaneous edema dorsally in the foot. TEE 01/05/16: Mitral valve: Mild, late systolicprolapse. There was a large, 2.3 cm (L) x 0.5 cm (W), strand-like, loosely organized, highly mobile vegetation on the atrial aspect of the posterior leaflet. P3 (medial) scallop. There was moderate regurgitation directed centrally. 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 Would treat for a minimum of 6 wks . Will continue to monitor renal function while on antibiotics - please have CT surgery evaluate and provide recommendations to whether the patient would be a candidate for valve surgery,  - she is at high risk for further embolization given size of vegetation - leukocytosis is likely reflection of infection, will repeat blood culture today.  - please wait for placing picc line.  Elzie Rings [place] for Infectious Diseases [phone]

## 2016-01-05 NOTE — Progress Notes (Signed)
Rehab Admissions Coordinator Note:  Patient was screened by Trish Mage for appropriateness for an Inpatient Acute Rehab Consult.  At this time, we are recommending Inpatient Rehab consult.  Lelon Frohlich M 01/05/2016, 1:29 PM  I can be reached at [phone].

## 2016-01-05 NOTE — Evaluation (Signed)
Physical Therapy Re-Evaluation Patient Details Name: Cassandra [name] MRN: [id number] DOB: September 25, 1933 Today's Date: 01/05/2016   History of Present Illness  82yo white female who presents to [facility] on 12/29/15 after 2 days of N/V, R flank pain, and generalized weakness. Pt admitted for Gastroenteritis after workup. At baseline, pt reports she is a fully indep community ambulator without AD. PMH: HTN, HLD, and L hallux amputation 1ya. During past few days patient has been increasingly lethargic and somnolent. MRI findings triggered podiatry consult on 1/31, followed by debridement and new NWB status on LLE. MRI also performed secondary to acute encephlaopathy/AMS and revealed multiple acattered punctuate acute lunar infarcts. Pt was transferred from [facility] on 01/04/16.  Clinical Impression  Pt admitted with above diagnosis. Pt currently with functional limitations due to the deficits listed below (see PT Problem List).  Pt will benefit from skilled PT to increase their independence and safety with mobility to allow discharge to the venue listed below. Patient now transferred to  [facility]  Where MRI revealed multiple accatered punctuate acute lunar infarcts. Re-evaluation conducted. Patient demonstrating significant improvement with mobility compared to previous PT session but still requiring +2 assistance.  Recommending CIR at D/C if accepted. PT to continue to follow and progress mobility as tolerated. Patient goals reviewed and modified.       Follow Up Recommendations CIR    Equipment Recommendations  None recommended by PT    Recommendations for Other Services Rehab consult     Precautions / Restrictions Precautions Precautions: Fall Precaution Comments: L foot infection/NWB Restrictions Weight Bearing Restrictions: Yes LLE Weight Bearing: Non weight bearing      Mobility  Bed Mobility Overal bed mobility: +2 for physical assistance;Needs Assistance Bed Mobility:  Rolling;Sidelying to Sit;Sit to Supine Rolling: +2 for physical assistance;Min assist Sidelying to sit: Mod assist;+2 for physical assistance;+2 for safety/equipment;HOB elevated Supine to sit: +2 for safety/equipment;Mod assist;HOB elevated Sit to supine: +2 for physical assistance;+2 for safety/equipment;Max assist   General bed mobility comments: Assist provided at trunk and LEs. Patient assisting with UEs with supine to sit.   Transfers Overall transfer level: Needs assistance               General transfer comment: Unable to achieve full standing position with +2 total assist. Attempting with/without rw. Noted patient not assisting with LEs with attempts to stand. No change with or without rw.   Ambulation/Gait                Stairs            Wheelchair Mobility    Modified Rankin (Stroke Patients Only)       Balance Overall balance assessment: Needs assistance Sitting-balance support: Feet supported;Bilateral upper extremity supported Sitting balance-Leahy Scale: Poor Sitting balance - Comments: patient with tendency toward strong posterior lean, able to improve with cues but returning to posterior lean again.  Postural control: Posterior lean                                   Pertinent Vitals/Pain Pain Assessment: No/denies pain Pain Score: 6  Faces Pain Scale: Hurts even more Pain Location: back, right lateral hip Pain Descriptors / Indicators: Grimacing;Guarding Pain Intervention(s): Limited activity within patient's tolerance;Monitored during session    Home Living Family/patient expects to be discharged to:: Private residence Living Arrangements: Spouse/significant other Available Help at Discharge: Family Type of Home: House  Home Access: Stairs to enter Entrance Stairs-Rails: Left Entrance Stairs-Number of Steps: 5 steps on front porch c 1 handrail  Home Layout: Laundry or work area in basement;Two level Home Equipment:  Walker - 2 wheels Additional Comments: Tub shower    Prior Function Level of Independence: Independent         Comments: Pt does all ADL's, functional mobility w/o AD and drives prior to this hospitialization.     Hand Dominance   Dominant Hand: Right    Extremity/Trunk Assessment   Upper Extremity Assessment: Generalized weakness;RUE deficits/detail;LUE deficits/detail (Pt with AROM WFL's bilaterally, impired gross/fine motor contorl - difficulty following commands for MMT)           Lower Extremity Assessment: Defer to PT evaluation         Communication   Communication: No difficulties  Cognition Arousal/Alertness: Awake/alert Behavior During Therapy: Flat affect;Anxious Overall Cognitive Status: Impaired/Different from baseline Area of Impairment: Safety/judgement;Memory;Attention;Problem solving     Memory: Decreased short-term memory Following Commands: Follows one step commands inconsistently Safety/Judgement: Decreased awareness of safety   Problem Solving: Slow processing;Decreased initiation;Difficulty sequencing;Requires verbal cues;Requires tactile cues      General Comments      Exercises        Assessment/Plan    PT Assessment    PT Diagnosis     PT Problem List    PT Treatment Interventions     PT Goals (Current goals can be found in the Care Plan section) Acute Rehab PT Goals Patient Stated Goal: be able to get back moving again.  PT Goal Formulation: With patient Time For Goal Achievement: 01/19/16 Potential to Achieve Goals: Good    Frequency Min 3X/week   Barriers to discharge        Co-evaluation PT/OT/SLP Co-Evaluation/Treatment: Yes Reason for Co-Treatment: For patient/therapist safety;Complexity of the patient's impairments (multi-system involvement) PT goals addressed during session: Mobility/safety with mobility OT goals addressed during session:  (Functional transfers)       End of Session Equipment Utilized  During Treatment: Gait belt Activity Tolerance: Patient limited by fatigue;Other (comment) (patient appearing anxious with stand attempts) Patient left: in bed;with call bell/phone within reach;with family/visitor present;with bed alarm set Nurse Communication: Mobility status;Other (comment) (encourage lift for transfers)    Functional Assessment Tool Used: Clinical Judgment  Functional Limitation: Mobility: Walking and moving around Mobility: Walking and Moving Around Current Status (U2725): At least 60 percent but less than 80 percent impaired, limited or restricted Mobility: Walking and Moving Around Goal Status [phone]): At least 20 percent but less than 40 percent impaired, limited or restricted    Time: 1140-1203 PT Time Calculation (min) (ACUTE ONLY): 23 min   Charges:   PT Evaluation $PT Re-evaluation: 1 Procedure     PT G Codes:   PT G-Codes **NOT FOR INPATIENT CLASS** Functional Assessment Tool Used: Clinical Judgment  Functional Limitation: Mobility: Walking and moving around Mobility: Walking and Moving Around Current Status (I3474): At least 60 percent but less than 80 percent impaired, limited or restricted Mobility: Walking and Moving Around Goal Status [phone]): At least 20 percent but less than 40 percent impaired, limited or restricted    Christiane Ha, PT, CSCS Pager [phone] Office [phone]  01/05/2016, 2:00 PM

## 2016-01-05 NOTE — Progress Notes (Signed)
Patient is active w/ home health RN and PT with [facility]

## 2016-01-05 NOTE — Progress Notes (Signed)
CCMD called to notify of 9 beat run of SVT.  Paged MD to make him aware

## 2016-01-05 NOTE — Progress Notes (Signed)
Patient family asking about a barium swallow for patient to be able to eat and drink.  I did not see any orders on that.  Paged MD to get clarification on that

## 2016-01-05 NOTE — Progress Notes (Addendum)
[facility] PROGRESS NOTE  Cassandra [name] GNF:[id number] DOB: 1932/12/05 DOA: 12/29/2015 PCP: Colette Ribas, MD  Summary:   82 year old female who presented to the hospital with complaints of nausea, vomiting and abdominal pain. She was found to have a possible viral gastroenteritis and was treated supportively. During her hospital stay, she developed significant fevers. Blood cultures were sent and returned positive for MSSA bacteremia. He is not entirely clear. It may be related to the chronic wound on her foot. MRI of the foot could not rule out underlying osteoarthritis versus abscess. Podiatry has been consulted. Currently she is on intravenous antibiotics. Anticipate that she'll be in the hospital several more days. Eventual plan will be to discharge to skilled nursing facility.  Assessment/Plan: 1. Sepsis. Evidenced by blood cultures from 12/31/2015 growing  MSSA, white count of 25,200, having a temp of 101.7 on admission. Case was discussed with Dr. Ilsa Iha of infectious disease who will consult. She is currently on Nafcillin 2 g IV every 4 hours. With MRI of brain showing multiple scattered infarcts there was concern for septic emboli. She underwent a transesophageal echocardiogram on 01/05/2016 which revealed large hypermobile mitral valve vegetation. Source of bacteremia not entirely clear at this time, questionable source of foot ulcer. Repeat blood cultures sent on 1/30 showing no growth to date.  2. Septic emboli- MRI performed 01/04/2016 showed Multiple (approximately 6) scattered punctate acute lacunar infarcts in the brain. Left MCA, bilateral PCA, and SCA territories are affected. This was further workup with transesophageal echocardiogram that revealed mitral valve vegetation. She is on IV antibiotic therapy with nafcillin. Neurology following. 3. Chronic left foot wound . MRI of foot shows possible underlying osteomyelitis, cannot rule out underlying abscess. May be  related to bacteremia. Podiatry consulted, she was evaluated by Dr.McKinney.  4. Nausea/Vomiting/diarrhea/abd pain. Pet patient granddaughter this is improved as patient has been stating less frequently she is in pain. Previous Abd/pelvis CT negative for acute findings. Continue symptomatic management and IVF.  Still having some CVA tenderness on palpation 5. Elevated troponin. Resolved. Denies any chest pain at this time. May be demand ischemia in the setting of dehydration.   6. Dehydration, improving with IVF. Will decrease IV fluid rate to 75 mL/hour 7. Hypokalemia, potassium 3.8 on 01/04/2016 after replacement. 8. Weakness, likely related to # 1, occupational therapy recommending inpatient rehabilitation, also placed. 9. HTN, BP elevated- ? Relation to emboli and perfusion deficits seen on MRI. Starting Norvasc 5 mg by mouth daily 10. HLD, continue statin   Code Status: Full DVT prophylaxis: Heparin Family Communication: I spoke with her daughter and granddaughter who were present at bedside Disposition Plan: Possible transition to acute rehabilitation when medically stable. Plan for prolonged course of IV antibiotic therapy   Consultants:  PT - SNF  Infectious Disease  Podiatry  Neurology  Cardiology  Procedures:  ECHO Study Conclusions- Left ventricle: The cavity size was normal. Systolic function was vigorous. The estimated ejection fraction was in the range of 65% to 70%. Wall motion was normal; there were no regional wall motion abnormalities. Doppler parameters are consistent with abnormal left ventricular relaxation (grade 1 diastolic dysfunction). There was no evidence of elevated ventricular filling pressure by Doppler parameters. - Aortic valve: Trileaflet; normal thickness leaflets. There was no regurgitation. - Aortic root: The aortic root was normal in size. - Mitral valve: Structurally normal valve. There was mild regurgitation. - Left  atrium: The atrium was mildly dilated. - Right ventricle: Systolic function was normal. - Tricuspid  valve: There was mild regurgitation. - Pulmonic valve: There was no regurgitation. - Pulmonary arteries: Systolic pressure was moderately increased. PA peak pressure: 48 mm Hg (S). - Inferior vena cava: The vessel was normal in size. - Pericardium, extracardiac: There was no pericardial effusion.  Antibiotics:  Vancomycin 1/29>>1/31  Cefazolin 1/29>> 2/1  Nafcillin 2/1>>  HPI/Subjective: Patient was sleeping however arousable, answered to my questions appropriately.  Objective: Filed Vitals:   01/05/16 1530 01/05/16 1534  BP: 164/79 156/74  Pulse: 93   Temp:    Resp: 16     Intake/Output Summary (Last 24 hours) at 01/05/16 1639 Last data filed at 01/05/16 0932  Gross per 24 hour  Intake 693.33 ml  Output      1 ml  Net 692.33 ml   Filed Weights   12/29/15 0759 12/29/15 1957  Weight: 80.74 kg (178 lb) 81.2 kg (179 lb 0.2 oz)    Exam: General: NAD. Arousable, appears less confused today. Cardiovascular: Regular rate and rhythm. I/VI systolic murmur heard best at aortic and pulmonic area  Respiratory: CTAB. No wheezes, rales, or rhonchi.   Abdomen: soft, non tender, no distention, bowel sounds normal Musculoskeletal: No edema b/l. S/p left greater toe amputated. Dressing intact over left great toe and forefoot.    Data Reviewed: Basic Metabolic Panel:  Recent Labs Lab 12/31/15 0546 01/01/16 1142 01/02/16 0534 01/03/16 0525 01/04/16 0542  NA 136 134* 136 133* 136  K 3.2* 3.5 2.7* 3.7 3.8  CL 105 104 104 106 106  CO2 24 20* 22 20* 21*  GLUCOSE 107* 114* 114* 126* 129*  BUN 22* 24* 30* 37* 47*  CREATININE 0.90 0.84 0.99 0.91 1.05*  CALCIUM 8.2* 8.6* 8.5* 8.6* 8.7*   Liver Function Tests:  Recent Labs Lab 12/30/15 0501 12/31/15 0546  AST 25 24  ALT 21 20  ALKPHOS 71 78  BILITOT 0.7 1.0  PROT 6.0* 6.0*  ALBUMIN 3.0* 2.7*   No results for  input(s): LIPASE, AMYLASE in the last 168 hours. CBC:  Recent Labs Lab 12/30/15 0501 12/31/15 0546 01/02/16 0534 01/03/16 0525 01/04/16 0542  WBC 10.2 8.8 13.9* 20.0* 25.2*  HGB 12.0 11.4* 11.9* 12.0 12.7  HCT 36.0 34.1* 34.9* 34.6* 37.3  MCV 92.1 91.4 89.7 88.7 88.8  PLT 133* 124* 159 179 204   Cardiac Enzymes:  Recent Labs Lab 12/30/15 0501 12/30/15 1218 12/30/15 1836 12/31/15 0059  TROPONINI 0.04* 0.04* 0.04* 0.03     Recent Results (from the past 240 hour(s))  C difficile quick scan w PCR reflex     Status: None   Collection Time: 12/30/15 12:28 AM  Result Value Ref Range Status   C Diff antigen NEGATIVE NEGATIVE Final   C Diff toxin NEGATIVE NEGATIVE Final   C Diff interpretation Negative for toxigenic C. difficile  Final    Comment: NEGATIVE  Culture, blood (routine x 2)     Status: None   Collection Time: 12/31/15 12:16 PM  Result Value Ref Range Status   Specimen Description BLOOD RIGHT ARM  Final   Special Requests BOTTLES DRAWN AEROBIC AND ANAEROBIC 6CC  Final   Culture  Setup Time   Final    GRAM POSITIVE COCCI IN CLUSTERS IN BOTH AEROBIC AND ANAEROBIC BOTTLES CRITICAL RESULT CALLED TO, READ BACK BY AND VERIFIED WITH: Carnella Guadalajara, J. AT 0035 ON 01/01/2016 BY AGUNDIZ,E.    Culture   Final    STAPHYLOCOCCUS AUREUS Performed at [facility]    Report Status 01/03/2016  FINAL  Final   Organism ID, Bacteria STAPHYLOCOCCUS AUREUS  Final      Susceptibility   Staphylococcus aureus - MIC*    CIPROFLOXACIN <=0.5 SENSITIVE Sensitive     ERYTHROMYCIN <=0.25 SENSITIVE Sensitive     GENTAMICIN <=0.5 SENSITIVE Sensitive     OXACILLIN <=0.25 SENSITIVE Sensitive     TETRACYCLINE <=1 SENSITIVE Sensitive     VANCOMYCIN <=0.5 SENSITIVE Sensitive     TRIMETH/SULFA <=10 SENSITIVE Sensitive     CLINDAMYCIN <=0.25 SENSITIVE Sensitive     RIFAMPIN <=0.5 SENSITIVE Sensitive     Inducible Clindamycin NEGATIVE Sensitive     * STAPHYLOCOCCUS AUREUS  Culture, blood  (routine x 2)     Status: None   Collection Time: 12/31/15 12:33 PM  Result Value Ref Range Status   Specimen Description BLOOD RIGHT ARM  Final   Special Requests BOTTLES DRAWN AEROBIC AND ANAEROBIC 6CC  Final   Culture  Setup Time   Final    GRAM POSITIVE COCCI IN CLUSTERS IN BOTH AEROBIC AND ANAEROBIC BOTTLES CRITICAL RESULT CALLED TO, READ BACK BY AND VERIFIED WITH: LAVINDER,J. AT 9147 ON 01/01/2016 BY AGUNDIZ,E.    Culture   Final    STAPHYLOCOCCUS AUREUS SUSCEPTIBILITIES PERFORMED ON PREVIOUS CULTURE WITHIN THE LAST 5 DAYS. Performed at [facility]    Report Status 01/03/2016 FINAL  Final  Culture, blood (Routine X 2) w Reflex to ID Panel     Status: None (Preliminary result)   Collection Time: 01/02/16  2:11 PM  Result Value Ref Range Status   Specimen Description BLOOD RIGHT ANTECUBITAL  Final   Special Requests   Final    BOTTLES DRAWN AEROBIC AND ANAEROBIC 6CC EACH  IMMUNE:COMPROMISED   Culture NO GROWTH 3 DAYS  Final   Report Status PENDING  Incomplete  Culture, blood (Routine X 2) w Reflex to ID Panel     Status: None (Preliminary result)   Collection Time: 01/02/16  2:13 PM  Result Value Ref Range Status   Specimen Description BLOOD RIGHT HAND  Final   Special Requests   Final    BOTTLES DRAWN AEROBIC AND ANAEROBIC 6CC  IMMUNE:COMPROMISED   Culture NO GROWTH 3 DAYS  Final   Report Status PENDING  Incomplete  Wound culture     Status: None (Preliminary result)   Collection Time: 01/03/16  6:43 PM  Result Value Ref Range Status   Specimen Description WOUND LEFT FOOT  Final   Special Requests NONE  Final   Gram Stain   Final    NO WBC SEEN NO SQUAMOUS EPITHELIAL CELLS SEEN NO ORGANISMS SEEN Performed at [facility]    Culture   Final    MODERATE STAPHYLOCOCCUS AUREUS Note: RIFAMPIN AND GENTAMICIN SHOULD NOT BE USED AS SINGLE DRUGS FOR TREATMENT OF STAPH INFECTIONS. Performed at [facility]    Report Status PENDING  Incomplete      Studies: [name] Contrast  01/09/16  CLINICAL DATA:  82 year old female with altered mental status, not responding. Initial encounter. EXAM: MRI HEAD WITHOUT AND WITH CONTRAST TECHNIQUE: Multiplanar, multiecho pulse sequences of the brain and surrounding structures were obtained without and with intravenous contrast. CONTRAST:  16mL MULTIHANCE GADOBENATE DIMEGLUMINE 529 MG/ML IV SOLN COMPARISON:  Head CT without contrast 06/14/2014. Brain MRI, MRA a 03/13/2012. FINDINGS: About half a dozen punctate foci of restricted diffusion are demonstrated scattered in the bilateral hemispheres and superior cerebellum. One of these areas is in the medial right occipital lobe  PCA territory where there is suggestion of mild underlying cortical encephalomalacia (series 102, image 19). Left MCA, bilateral PCA, and superior cerebellar artery territories are affected. No acute intracranial hemorrhage identified. No midline shift, mass effect, or evidence of intracranial mass lesion. Major intracranial vascular flow voids are stable. Minimal to mild generalized cerebral volume loss since 2013. No ventriculomegaly, extra-axial collection or acute intracranial hemorrhage. Cervicomedullary junction and pituitary are within normal limits. Negative visualized cervical spine. Patchy and scattered bilateral cerebral white matter T2 and FLAIR hyperintensity is stable. No abnormal enhancement identified. Visible internal auditory structures appear normal. Mild chronic right mastoid effusion is unchanged. Nasopharynx remains normal. Trace paranasal sinus mucosal thickening. Chronic postoperative changes to both globes. Negative scalp soft tissues. IMPRESSION: 1. 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 Left MCA, bilateral PCA, and SCA territories are affected. Therefore, consider recent embolic event from the heart or proximal aorta (less likely posterior circulation only thromboembolism).  2. No associated hemorrhage or mass effect. 3. Otherwise no significant change in the MRI appearance of the brain since 2013. Electronically Signed   By: Odessa Fleming M.D.   On: 01/04/2016 09:49   Mr Foot Left Wo Contrast  01/03/2016  CLINICAL DATA:  Bacteremia. Open wounds on the left foot with history of amputation. EXAM: MRI OF THE LEFT FOREFOOT WITHOUT CONTRAST TECHNIQUE: Multiplanar, multisequence MR imaging was performed. No intravenous contrast was administered. COMPARISON:  01/02/2016; 09/15/2014 FINDINGS: The patient refused IV contrast. Despite efforts by the technologist and patient, severe motion artifact is present on today's exam and could not be eliminated. This reduces exam sensitivity and specificity. First digit amputation at the MTP joint, with a approximately 1.5 by 0.8 by 0.8 cm fluid collection just plantar to the head of the first metatarsal as shown on images 27-32 of series 10, and surrounding abnormal inflammatory effacement of the adipose tissues. Abscess not excluded. Low signal intensity focus on image 33 series 9 possibly a tiny microscopic metallic particle or tiny focus of gas along the amputation margin. As on conventional radiographs, we demonstrate erosive findings in the head of the second and third metatarsals as well as in the bases of the proximal phalanges of the second and third toes. There is some low-level increased T2 signal along the third metatarsal head as on image 27 series 10 Low-level edema tracks along the plantar musculature of the foot. There is some mild subcutaneous edema dorsally in the foot. Degenerative findings along the Lisfranc joint. No Lisfranc joint malalignment identified. IMPRESSION: 1. Erosive findings at the second and third metatarsophalangeal joints are confirmed. This could represent infection, gout, or rheumatoid arthropathy. Particularly in the head of the third metatarsal, osteomyelitis is of greater suspicion. Today's images are severely  degraded by motion artifact, and the patient refused IV contrast. 2. Fluid collection plantar to the head of the first metatarsal -abscess not excluded given the inflammatory effacement of the surrounding fatty subcutaneous tissues. Electronically Signed   By: Gaylyn Rong M.D.   On: 01/03/2016 08:41    Scheduled Meds: .  stroke: mapping our early stages of recovery book   Does not apply Once  . antiseptic oral rinse  7 mL Mouth Rinse BID  . aspirin  300 mg Rectal Daily  . brinzolamide  1 drop Both Eyes TID   And  . brimonidine  1 drop Both Eyes TID  . calcium-vitamin D  1 tablet Oral Daily  . famotidine (PEPCID) IV  20 mg Intravenous  Q24H  . heparin  5,000 Units Subcutaneous 3 times per day  . latanoprost  1 drop Both Eyes QHS  . nafcillin IV  2 g Intravenous Q4H  . nystatin  5 mL Oral QID   Continuous Infusions: . sodium chloride 100 mL/hr at 01/05/16 1330    Principal Problem:   Sepsis (HCC) Active Problems:   Hypertension   Hyperlipidemia   Gastroenteritis   Glaucoma   Staphylococcus aureus bacteremia   Diminished pulses in lower extremity   Emesis   Time spent: 35 minutes   Mahala Menghini MD  Resident Physician [facility] 01/05/2016, 4:39 PM  LOS: 4 days

## 2016-01-05 NOTE — Consult Note (Signed)
Requesting Physician: Dr. Vanessa Barbara    Chief Complaint: stroke  HPI:                                                                                                                                         Cassandra [name] is an 82 y.o. female with past medical history of hypertension, arthritis, hyperlipidemia, left leg chronic ulcer, presents with complaints of generalized weakness, right flank pain, nausea, vomiting, diarrhea, over last 48 hours, patient reports she's been feeling ill over last 48 hours, poor appetite, generalized weakness, fever and feeling feverish,, had vomiting started yesterday, as well diarrhea, soft stool. Admitted to hospital for dehydration at [facility]. Patient later became febrile and was found to have MSSA bacteremia. At this time, source of bacteremia is uncertain. Pt remains on IV abx and ID continues to follow. Patient recently noted to be more confused and with R sided weakness. MRI brain notable for multiple punctate lacunar infarcts worrisome for septic emboli. Patietn was transferred to [facility] hospital for further evaluation of bacterial endocarditis and Septic emboli. Currently on ASA, Nafcillin. Awaiting Echo and TEE. Per daughter at bedside, she has improved over night.   Date last known well: Unable to determine Time last known well: Unable to determine tPA Given: No: no LSN and likely septic in source.      Past Medical History  Diagnosis Date  . Hypertension   . Arthritis   . Depression   . Hyperlipidemia   . PFO (patent foramen ovale)     small  . H/O echocardiogram 2008    prolapse of post. mitral leaflet, tr MR, tr TR, EF >55%, mild concentric LVH  . H/O cardiovascular stress test 10/13/2007    low risk scan, no changes from previous test  . Heart palpitations     controlled on BB  . Hiatal hernia   . Cellulitis and abscess of toe of left foot 09/16/2014    Past Surgical History  Procedure Laterality Date  . Cholecystectomy    . Cardiac  catheterization  10/2002    normal coronary arteries, normal LV function  . Incision and drainage Right 07/21/2014    Procedure: INCISION AND DRAINAGE;  Surgeon: Dallas Schimke, DPM;  Location: [facility];  Service: Podiatry;  Laterality: Right;  . Incision and drainage Left 09/17/2014    Procedure: INCISION AND DRAINAGE 1ST TOE LEFT FOOT;  Surgeon: Dallas Schimke, DPM;  Location: [facility];  Service: Podiatry;  Laterality: Left;  . Bone biopsy Left 09/17/2014    Procedure: BONE BIOPSY;  Surgeon: Dallas Schimke, DPM;  Location: [facility];  Service: Podiatry;  Laterality: Left;  . Amputation Left 09/20/2014    Procedure: AMPUTATION DIGIT (LEFT GREAT TOE);  Surgeon: Dallas Schimke, DPM;  Location: [facility];  Service: Podiatry;  Laterality: Left;    Family History  Problem Relation Age of Onset  .  Heart disease Mother   . Cancer Father   . Kidney disease Brother   . Hypertension Daughter   . Hyperlipidemia Son    Social History:  reports that she has never smoked. She has never used smokeless tobacco. She reports that she does not drink alcohol or use illicit drugs.  Allergies:  Allergies  Allergen Reactions  . Codeine Shortness Of Breath and Nausea And Vomiting  . Hydrocodone Shortness Of Breath and Nausea And Vomiting    Medications:                                                                                                                           Prior to Admission:  Prescriptions prior to admission  Medication Sig Dispense Refill Last Dose  . aspirin 81 MG chewable tablet Chew 81 mg by mouth daily.   12/28/2015 at Unknown time  . atenolol (TENORMIN) 100 MG tablet Take 1 tablet by mouth daily.   12/28/2015 at 0900  . Biotin 5000 MCG CAPS Take 10,000 mcg by mouth daily.    12/28/2015 at Unknown time  . Calcium Carbonate-Vitamin D (CALCIUM + D) 600-200 MG-UNIT TABS Take 1 tablet by mouth daily.   12/28/2015 at Unknown time  . carboxymethylcellulose (REFRESH  PLUS) 0.5 % SOLN Place 1 drop into both eyes 3 (three) times daily as needed (Dry Eyes).   12/28/2015 at Unknown time  . Cyanocobalamin (VITAMIN B-12 IJ) Inject as directed every 30 (thirty) days.   12/02/2015 at Unknown time  . latanoprost (XALATAN) 0.005 % ophthalmic solution Place 1 drop into both eyes at bedtime.   12/28/2015 at Unknown time  . losartan (COZAAR) 100 MG tablet Take 100 mg by mouth daily.  11 12/28/2015 at Unknown time  . Multiple Vitamins-Minerals (PRESERVISION AREDS PO) Take 1 tablet by mouth 2 (two) times daily.   12/28/2015 at Unknown time  . naproxen (NAPROSYN) 500 MG tablet Take 500 mg by mouth daily. For pain   12/28/2015 at Unknown time  . ranitidine (ZANTAC) 150 MG tablet Take 150 mg by mouth 2 (two) times daily.   12/28/2015 at Unknown time  . sertraline (ZOLOFT) 100 MG tablet Take 100 mg by mouth daily.    12/28/2015 at Unknown time  . silver sulfADIAZINE (SILVADENE) 1 % cream Apply 1 application topically 2 (two) times daily.   Past Week at Unknown time  . SIMBRINZA 1-0.2 % SUSP Place 1 drop into both eyes 3 (three) times daily.   12/28/2015 at Unknown time  . simvastatin (ZOCOR) 10 MG tablet Take 10 mg by mouth at bedtime.   12/28/2015 at Unknown time  . traMADol (ULTRAM) 50 MG tablet Take one or two tablets by mouth every 6 hours as needed for pain 240 tablet 5 12/28/2015 at Unknown time   Scheduled: .  stroke: mapping our early stages of recovery book   Does not apply Once  . antiseptic oral rinse  7 mL Mouth Rinse BID  .  aspirin  300 mg Rectal Daily  . brinzolamide  1 drop Both Eyes TID   And  . brimonidine  1 drop Both Eyes TID  . calcium-vitamin D  1 tablet Oral Daily  . famotidine (PEPCID) IV  20 mg Intravenous Q24H  . heparin  5,000 Units Subcutaneous 3 times per day  . latanoprost  1 drop Both Eyes QHS  . nafcillin IV  2 g Intravenous Q4H  . nystatin  5 mL Oral QID    ROS:                                                                                                                                        History obtained from the patient  General ROS: negative for - chills, fatigue, fever, night sweats, weight gain or weight loss Psychological ROS: negative for - behavioral disorder, hallucinations, memory difficulties, mood swings or suicidal ideation Ophthalmic ROS: negative for - blurry vision, double vision, eye pain or loss of vision ENT ROS: negative for - epistaxis, nasal discharge, oral lesions, sore throat, tinnitus or vertigo Allergy and Immunology ROS: negative for - hives or itchy/watery eyes Hematological and Lymphatic ROS: negative for - bleeding problems, bruising or swollen lymph nodes Endocrine ROS: negative for - galactorrhea, hair pattern changes, polydipsia/polyuria or temperature intolerance Respiratory ROS: negative for - cough, hemoptysis, shortness of breath or wheezing Cardiovascular ROS: negative for - chest pain, dyspnea on exertion, edema or irregular heartbeat Gastrointestinal ROS: negative for - abdominal pain, diarrhea, hematemesis, nausea/vomiting or stool incontinence Genito-Urinary ROS: negative for - dysuria, hematuria, incontinence or urinary frequency/urgency Musculoskeletal ROS: negative for - joint swelling or muscular weakness Neurological ROS: as noted in HPI Dermatological ROS: negative for rash and skin lesion changes  Neurologic Examination:                                                                                                      Blood pressure 165/98, pulse 95, temperature 97.9 F (36.6 C), temperature source Oral, resp. rate 18, height 5' 9.5" (1.765 m), weight 81.2 kg (179 lb 0.2 oz), SpO2 96 %.  HEENT-  Normocephalic, no lesions, without obvious abnormality.  Normal external eye and conjunctiva.  Normal TM's bilaterally.  Normal auditory canals and external ears. Normal external nose, mucus membranes and septum.  Normal pharynx. Cardiovascular- S1, S2 normal, pulses palpable throughout    Lungs- chest clear, no wheezing, rales, normal symmetric air entry Abdomen- normal findings: bowel sounds normal Extremities- no  edema Lymph-no adenopathy palpable Musculoskeletal-no joint tenderness, deformity or swelling Skin-warm and dry, no hyperpigmentation, vitiligo, or suspicious lesions  Neurological Examination Mental Status: Sedated and easily falls asleep if not stimulated, not oriented.  Speech fluent without evidence of aphasia.  Able to simple  step commands only. Cranial Nerves: II: Discs flat bilaterally; Visual fields grossly normal, pupils equal, round, reactive to light and accommodation III,IV, VI: ptosis not present, extra-ocular motions intact bilaterally V,VII: smile symmetric, facial light touch sensation normal bilaterally VIII: hearing normal bilaterally IX,X: uvula rises symmetrically XI: bilateral shoulder shrug XII: midline tongue extension Motor: Right : Upper extremity   5/5    Left:     Upper extremity   5/5  Lower extremity   5/5     Lower extremity   5/5 Tone and bulk:normal tone throughout; no atrophy noted Sensory: Pinprick and light touch intact throughout, bilaterally Deep Tendon Reflexes: 1+ and symmetric throughout Plantars: Right: downgoing   Left: downgoing Cerebellar: normal finger-to-nose,  Gait: not tested       Lab Results: Basic Metabolic Panel:  Recent Labs Lab 12/31/15 0546 01/01/16 1142 01/02/16 0534 01/03/16 0525 01/04/16 0542  NA 136 134* 136 133* 136  K 3.2* 3.5 2.7* 3.7 3.8  CL 105 104 104 106 106  CO2 24 20* 22 20* 21*  GLUCOSE 107* 114* 114* 126* 129*  BUN 22* 24* 30* 37* 47*  CREATININE 0.90 0.84 0.99 0.91 1.05*  CALCIUM 8.2* 8.6* 8.5* 8.6* 8.7*    Liver Function Tests:  Recent Labs Lab 12/30/15 0501 12/31/15 0546  AST 25 24  ALT 21 20  ALKPHOS 71 78  BILITOT 0.7 1.0  PROT 6.0* 6.0*  ALBUMIN 3.0* 2.7*   No results for input(s): LIPASE, AMYLASE in the last 168 hours.  Recent Labs Lab  12/31/15 1233  AMMONIA 14    CBC:  Recent Labs Lab 12/30/15 0501 12/31/15 0546 01/02/16 0534 01/03/16 0525 01/04/16 0542  WBC 10.2 8.8 13.9* 20.0* 25.2*  HGB 12.0 11.4* 11.9* 12.0 12.7  HCT 36.0 34.1* 34.9* 34.6* 37.3  MCV 92.1 91.4 89.7 88.7 88.8  PLT 133* 124* 159 179 204    Cardiac Enzymes:  Recent Labs Lab 12/30/15 0501 12/30/15 1218 12/30/15 1836 12/31/15 0059  TROPONINI 0.04* 0.04* 0.04* 0.03    Lipid Panel: No results for input(s): CHOL, TRIG, HDL, CHOLHDL, VLDL, LDLCALC in the last 168 hours.  CBG: No results for input(s): GLUCAP in the last 168 hours.  Microbiology: Results for orders placed or performed during the hospital encounter of 12/29/15  C difficile quick scan w PCR reflex     Status: None   Collection Time: 12/30/15 12:28 AM  Result Value Ref Range Status   C Diff antigen NEGATIVE NEGATIVE Final   C Diff toxin NEGATIVE NEGATIVE Final   C Diff interpretation Negative for toxigenic C. difficile  Final    Comment: NEGATIVE  Culture, blood (routine x 2)     Status: None   Collection Time: 12/31/15 12:16 PM  Result Value Ref Range Status   Specimen Description BLOOD RIGHT ARM  Final   Special Requests BOTTLES DRAWN AEROBIC AND ANAEROBIC 6CC  Final   Culture  Setup Time   Final    GRAM POSITIVE COCCI IN CLUSTERS IN BOTH AEROBIC AND ANAEROBIC BOTTLES CRITICAL RESULT CALLED TO, READ BACK BY AND VERIFIED WITH: LAVINDER, J. AT 0035 ON 01/01/2016 BY AGUNDIZ,E.    Culture   Final    STAPHYLOCOCCUS AUREUS Performed at [facility]  [facility]    Report Status 01/03/2016 FINAL  Final   Organism ID, Bacteria STAPHYLOCOCCUS AUREUS  Final      Susceptibility   Staphylococcus aureus - MIC*    CIPROFLOXACIN <=0.5 SENSITIVE Sensitive     ERYTHROMYCIN <=0.25 SENSITIVE Sensitive     GENTAMICIN <=0.5 SENSITIVE Sensitive     OXACILLIN <=0.25 SENSITIVE Sensitive     TETRACYCLINE <=1 SENSITIVE Sensitive     VANCOMYCIN <=0.5 SENSITIVE Sensitive      TRIMETH/SULFA <=10 SENSITIVE Sensitive     CLINDAMYCIN <=0.25 SENSITIVE Sensitive     RIFAMPIN <=0.5 SENSITIVE Sensitive     Inducible Clindamycin NEGATIVE Sensitive     * STAPHYLOCOCCUS AUREUS  Culture, blood (routine x 2)     Status: None   Collection Time: 12/31/15 12:33 PM  Result Value Ref Range Status   Specimen Description BLOOD RIGHT ARM  Final   Special Requests BOTTLES DRAWN AEROBIC AND ANAEROBIC 6CC  Final   Culture  Setup Time   Final    GRAM POSITIVE COCCI IN CLUSTERS IN BOTH AEROBIC AND ANAEROBIC BOTTLES CRITICAL RESULT CALLED TO, READ BACK BY AND VERIFIED WITH: LAVINDER,J. AT 1610 ON 01/01/2016 BY AGUNDIZ,E.    Culture   Final    STAPHYLOCOCCUS AUREUS SUSCEPTIBILITIES PERFORMED ON PREVIOUS CULTURE WITHIN THE LAST 5 DAYS. Performed at [facility]    Report Status 01/03/2016 FINAL  Final  Culture, blood (Routine X 2) w Reflex to ID Panel     Status: None (Preliminary result)   Collection Time: 01/02/16  2:11 PM  Result Value Ref Range Status   Specimen Description BLOOD RIGHT ANTECUBITAL  Final   Special Requests   Final    BOTTLES DRAWN AEROBIC AND ANAEROBIC 6CC EACH  IMMUNE:COMPROMISED   Culture NO GROWTH 2 DAYS  Final   Report Status PENDING  Incomplete  Culture, blood (Routine X 2) w Reflex to ID Panel     Status: None (Preliminary result)   Collection Time: 01/02/16  2:13 PM  Result Value Ref Range Status   Specimen Description BLOOD RIGHT HAND  Final   Special Requests   Final    BOTTLES DRAWN AEROBIC AND ANAEROBIC 6CC  IMMUNE:COMPROMISED   Culture NO GROWTH 2 DAYS  Final   Report Status PENDING  Incomplete  Wound culture     Status: None (Preliminary result)   Collection Time: 01/03/16  6:43 PM  Result Value Ref Range Status   Specimen Description WOUND LEFT FOOT  Final   Special Requests NONE  Final   Gram Stain   Final    NO WBC SEEN NO SQUAMOUS EPITHELIAL CELLS SEEN NO ORGANISMS SEEN Performed at [facility]    Culture   Final     MODERATE STAPHYLOCOCCUS AUREUS Note: RIFAMPIN AND GENTAMICIN SHOULD NOT BE USED AS SINGLE DRUGS FOR TREATMENT OF STAPH INFECTIONS. Performed at [facility]    Report Status PENDING  Incomplete    Coagulation Studies: No results for input(s): LABPROT, INR in the last 72 hours.  Imaging: Mr Lodema Pilot Contrast  01/04/2016  CLINICAL DATA:  82 year old female with altered mental status, not responding. Initial encounter. EXAM: MRI HEAD WITHOUT AND WITH CONTRAST TECHNIQUE: Multiplanar, multiecho pulse sequences of the brain and surrounding structures were obtained without and with intravenous contrast. CONTRAST:  16mL MULTIHANCE GADOBENATE DIMEGLUMINE 529 MG/ML IV SOLN COMPARISON:  Head CT without contrast 06/14/2014. Brain MRI, MRA a 03/13/2012. FINDINGS: About half a dozen punctate foci of restricted diffusion  are demonstrated scattered in the bilateral hemispheres and superior cerebellum. One of these areas is in the medial right occipital lobe PCA territory where there is suggestion of mild underlying cortical encephalomalacia (series 102, image 19). Left MCA, bilateral PCA, and superior cerebellar artery territories are affected. No acute intracranial hemorrhage identified. No midline shift, mass effect, or evidence of intracranial mass lesion. Major intracranial vascular flow voids are stable. Minimal to mild generalized cerebral volume loss since 2013. No ventriculomegaly, extra-axial collection or acute intracranial hemorrhage. Cervicomedullary junction and pituitary are within normal limits. Negative visualized cervical spine. Patchy and scattered bilateral cerebral white matter T2 and FLAIR hyperintensity is stable. No abnormal enhancement identified. Visible internal auditory structures appear normal. Mild chronic right mastoid effusion is unchanged. Nasopharynx remains normal. Trace paranasal sinus mucosal thickening. Chronic postoperative changes to both globes. Negative scalp soft  tissues. IMPRESSION: 1. 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 Left MCA, bilateral PCA, and SCA territories are affected. Therefore, consider recent embolic event from the heart or proximal aorta (less likely posterior circulation only thromboembolism). 2. No associated hemorrhage or mass effect. 3. Otherwise no significant change in the MRI appearance of the brain since 2013. Electronically Signed   By: Odessa Fleming M.D.   On: 01/04/2016 09:49       Assessment and plan discussed with with attending physician and they are in agreement.    Felicie Morn PA-C [facility] [phone]  01/05/2016, 9:37 AM   Assessment: 82 y.o. female presenting with multifocal infarcts and positive blood cultures. I have high suspicion for endocarditis. She is going to undergo TEE today. If endocarditis is confirmed, then treatment is antibiotics rather than antithrombotic's.  Stroke Risk Factors - hyperlipidemia and hypertension  1) TEE 2) if confirmed vegetation, then no need for antiplatelets or anti-coagulants  3) stroke team to follow 4) PT, OT  Ritta Slot, MD [facility] [phone]  If 7pm- 7am, please page neurology on call as listed in AMION.

## 2016-01-05 NOTE — Care Management Important Message (Signed)
Important Message  Patient Details  Name: Cassandra [name] MRN: [id number] Date of Birth: 08-20-1933   Medicare Important Message Given:  Yes    Trelon Plush [name] 01/05/2016, 11:09 AM

## 2016-01-06 ENCOUNTER — Inpatient Hospital Stay (HOSPITAL_COMMUNITY): Payer: Medicare Other

## 2016-01-06 ENCOUNTER — Encounter (HOSPITAL_COMMUNITY): Payer: Self-pay | Admitting: Cardiovascular Disease

## 2016-01-06 DIAGNOSIS — I330 Acute and subacute infective endocarditis: Secondary | ICD-10-CM | POA: Insufficient documentation

## 2016-01-06 DIAGNOSIS — I6349 Cerebral infarction due to embolism of other cerebral artery: Secondary | ICD-10-CM

## 2016-01-06 DIAGNOSIS — R4701 Aphasia: Secondary | ICD-10-CM

## 2016-01-06 DIAGNOSIS — I339 Acute and subacute endocarditis, unspecified: Secondary | ICD-10-CM

## 2016-01-06 DIAGNOSIS — I6340 Cerebral infarction due to embolism of unspecified cerebral artery: Secondary | ICD-10-CM

## 2016-01-06 DIAGNOSIS — S91302S Unspecified open wound, left foot, sequela: Secondary | ICD-10-CM

## 2016-01-06 DIAGNOSIS — A419 Sepsis, unspecified organism: Secondary | ICD-10-CM

## 2016-01-06 LAB — LIPID PANEL
CHOL/HDL RATIO: 6.9 ratio
Cholesterol: 132 mg/dL (ref 0–200)
HDL: 19 mg/dL — ABNORMAL LOW (ref >40)
LDL Cholesterol: 71 mg/dL (ref 0–99)
Triglycerides: 209 mg/dL — ABNORMAL HIGH (ref <150)
VLDL: 42 mg/dL — AB (ref 0–40)

## 2016-01-06 LAB — WOUND CULTURE: GRAM STAIN: NONE SEEN

## 2016-01-06 LAB — BASIC METABOLIC PANEL
ANION GAP: 12 (ref 5–15)
BUN: 45 mg/dL — AB (ref 6–20)
CHLORIDE: 108 mmol/L (ref 101–111)
CO2: 23 mmol/L (ref 22–32)
Calcium: 8.3 mg/dL — ABNORMAL LOW (ref 8.9–10.3)
Creatinine, Ser: 1.03 mg/dL — ABNORMAL HIGH (ref 0.44–1.00)
GFR, EST AFRICAN AMERICAN: 58 mL/min — AB (ref >60)
GFR, EST NON AFRICAN AMERICAN: 50 mL/min — AB (ref >60)
Glucose, Bld: 132 mg/dL — ABNORMAL HIGH (ref 65–99)
POTASSIUM: 3.4 mmol/L — AB (ref 3.5–5.1)
SODIUM: 143 mmol/L (ref 135–145)

## 2016-01-06 LAB — BLOOD GAS, ARTERIAL
Acid-base deficit: 4 mmol/L — ABNORMAL HIGH (ref 0.0–2.0)
BICARBONATE: 19 meq/L — AB (ref 20.0–24.0)
DRAWN BY: 406621
FIO2: 0.21
O2 SAT: 95 %
PATIENT TEMPERATURE: 98.6
PH ART: 7.473 — AB (ref 7.350–7.450)
TCO2: 19.8 mmol/L (ref 0–100)
pCO2 arterial: 26.3 mmHg — ABNORMAL LOW (ref 35.0–45.0)
pO2, Arterial: 72.2 mmHg — ABNORMAL LOW (ref 80.0–100.0)

## 2016-01-06 LAB — TROPONIN I
TROPONIN I: 0.13 ng/mL — AB (ref <0.031)
Troponin I: 0.08 ng/mL — ABNORMAL HIGH (ref <0.031)
Troponin I: 0.1 ng/mL — ABNORMAL HIGH (ref <0.031)

## 2016-01-06 MED ORDER — ASPIRIN 81 MG PO CHEW
81.0000 mg | CHEWABLE_TABLET | Freq: Every day | ORAL | Status: DC
  Administered 2016-01-06 – 2016-01-13 (×8): 81 mg via ORAL
  Filled 2016-01-06 (×8): qty 1

## 2016-01-06 MED ORDER — SIMVASTATIN 10 MG PO TABS
10.0000 mg | ORAL_TABLET | Freq: Every day | ORAL | Status: DC
  Administered 2016-01-06 – 2016-01-12 (×7): 10 mg via ORAL
  Filled 2016-01-06 (×7): qty 1

## 2016-01-06 MED ORDER — ENSURE ENLIVE PO LIQD
237.0000 mL | Freq: Two times a day (BID) | ORAL | Status: DC
  Administered 2016-01-07 – 2016-01-13 (×13): 237 mL via ORAL

## 2016-01-06 MED ORDER — AMLODIPINE BESYLATE 10 MG PO TABS
10.0000 mg | ORAL_TABLET | Freq: Every day | ORAL | Status: DC
  Administered 2016-01-07 – 2016-01-13 (×7): 10 mg via ORAL
  Filled 2016-01-06 (×7): qty 1

## 2016-01-06 MED ORDER — IOHEXOL 350 MG/ML SOLN
80.0000 mL | Freq: Once | INTRAVENOUS | Status: AC | PRN
  Administered 2016-01-06: 80 mL via INTRAVENOUS

## 2016-01-06 MED ORDER — LORAZEPAM 2 MG/ML IJ SOLN
0.5000 mg | Freq: Four times a day (QID) | INTRAMUSCULAR | Status: DC | PRN
  Administered 2016-01-06: 0.5 mg via INTRAVENOUS
  Filled 2016-01-06: qty 1

## 2016-01-06 MED ORDER — NITROGLYCERIN 0.4 MG SL SUBL
0.4000 mg | SUBLINGUAL_TABLET | SUBLINGUAL | Status: DC | PRN
  Administered 2016-01-08 (×3): 0.4 mg via SUBLINGUAL
  Filled 2016-01-06 (×2): qty 1

## 2016-01-06 MED ORDER — METOCLOPRAMIDE HCL 5 MG/ML IJ SOLN
10.0000 mg | Freq: Four times a day (QID) | INTRAMUSCULAR | Status: DC
  Administered 2016-01-06 – 2016-01-13 (×30): 10 mg via INTRAVENOUS
  Filled 2016-01-06 (×30): qty 2

## 2016-01-06 MED ORDER — POTASSIUM CHLORIDE CRYS ER 20 MEQ PO TBCR
40.0000 meq | EXTENDED_RELEASE_TABLET | Freq: Once | ORAL | Status: AC
  Administered 2016-01-06: 40 meq via ORAL
  Filled 2016-01-06: qty 2

## 2016-01-06 MED ORDER — GI COCKTAIL ~~LOC~~
30.0000 mL | Freq: Three times a day (TID) | ORAL | Status: DC | PRN
  Administered 2016-01-06: 30 mL via ORAL
  Filled 2016-01-06: qty 30

## 2016-01-06 MED ORDER — MAGNESIUM SULFATE 2 GM/50ML IV SOLN
2.0000 g | Freq: Once | INTRAVENOUS | Status: AC
  Administered 2016-01-06: 2 g via INTRAVENOUS
  Filled 2016-01-06: qty 50

## 2016-01-06 MED ORDER — PANTOPRAZOLE SODIUM 40 MG IV SOLR
40.0000 mg | Freq: Two times a day (BID) | INTRAVENOUS | Status: DC
  Administered 2016-01-06 – 2016-01-08 (×5): 40 mg via INTRAVENOUS
  Filled 2016-01-06 (×5): qty 40

## 2016-01-06 MED ORDER — FAMOTIDINE 20 MG PO TABS
20.0000 mg | ORAL_TABLET | Freq: Every day | ORAL | Status: DC
  Administered 2016-01-06 – 2016-01-13 (×8): 20 mg via ORAL
  Filled 2016-01-06 (×8): qty 1

## 2016-01-06 MED ORDER — SERTRALINE HCL 100 MG PO TABS
100.0000 mg | ORAL_TABLET | Freq: Every day | ORAL | Status: DC
  Administered 2016-01-06 – 2016-01-13 (×8): 100 mg via ORAL
  Filled 2016-01-06 (×8): qty 1

## 2016-01-06 NOTE — Consult Note (Signed)
[address]       [place] [address]             [phone]        Cassandra [name] Medical Record #[id number] Date of Birth: [birth year]  Referring: Dr Vanessa Barbara Primary Care: Colette Ribas, MD  Chief Complaint:    Chief Complaint  Patient presents with  . Flank Pain    History of Present Illness:     Patient is 82 year old female, who notes that she been feeling poorly for the past 6 weeks but with nonspecific symptoms. She notes she went and made funeral arrangements last month. She saw cardiology in early December. She was then admitted to [facility] January 26 with nausea vomiting and right flank pain. Blood cultures drawn on January 28 were positive for methicillin sensitive staph . The patient has had chronic drainage from amputation site of the left great toe a year and half ago . She was started on vancomycin while at [facility] and ultimately [facility] . On February 1 she became increasingly confused and was not able to move her arms , MRI confirmed a stroke . Since she has improved some but remains confused at times . She is able to give her history but the details are questionable .    a TEE was performed by Dr. 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   Zubrod Score: At the time of surgery this patient's most appropriate activity status/level should be described as:     0    Normal activity, no symptoms     1    Restricted in physical strenuous activity but ambulatory, able to do out light work     2    Ambulatory and capable of self care, unable to do work  activities, up and about                 more than 50%  Of the time                                3    Only limited self care, in bed greater than 50% of waking hours     4    Completely disabled, no self care, confined to bed or chair     5    Moribund  Past Medical History  Diagnosis Date  . Hypertension   . Arthritis   . Depression   . Hyperlipidemia   . PFO (patent foramen ovale)     small  . H/O echocardiogram 2008    prolapse of post. mitral leaflet, tr MR, tr TR, EF >55%, mild concentric LVH  . H/O cardiovascular stress test 10/13/2007    low risk scan, no changes from previous test  . Heart palpitations     controlled on BB  . Hiatal hernia   . Cellulitis and abscess of toe of left foot 09/16/2014    Past Surgical History  Procedure Laterality Date  . Cholecystectomy    . Cardiac catheterization  10/2002    normal coronary arteries,  normal LV function  . Incision and drainage Right 07/21/2014    Procedure: INCISION AND DRAINAGE;  Surgeon: Dallas Schimke, DPM;  Location: [facility];  Service: Podiatry;  Laterality: Right;  . Incision and drainage Left 09/17/2014    Procedure: INCISION AND DRAINAGE 1ST TOE LEFT FOOT;  Surgeon: Dallas Schimke, DPM;  Location: [facility];  Service: Podiatry;  Laterality: Left;  . Bone biopsy Left 09/17/2014    Procedure: BONE BIOPSY;  Surgeon: Dallas Schimke, DPM;  Location: [facility];  Service: Podiatry;  Laterality: Left;  . Amputation Left 09/20/2014    Procedure: AMPUTATION DIGIT (LEFT GREAT TOE);  Surgeon: Dallas Schimke, DPM;  Location: [facility];  Service: Podiatry;  Laterality: Left;  . Tee without cardioversion N/A 01/05/2016    Procedure: TRANSESOPHAGEAL ECHOCARDIOGRAM (TEE);  Surgeon: Thurmon Fair, MD;  Location[facility] ENDOSCOPY;  Service: Cardiovascular;  Laterality: N/A;    History  Smoking status  . Never Smoker   Smokeless tobacco  . Never Used    History  Alcohol Use No    Social History    Social History  . Marital Status: Married    Spouse Name: N/A  . Number of Children: N/A  . Years of Education: N/A   Occupational History  . Not on file.   Social History Main Topics  . Smoking status: Never Smoker   . Smokeless tobacco: Never Used  . Alcohol Use: No  . Drug Use: No  . Sexual Activity: Not on file   Other Topics Concern  . Not on file   Social History Narrative    Allergies  Allergen Reactions  . Codeine Shortness Of Breath and Nausea And Vomiting  . Hydrocodone Shortness Of Breath and Nausea And Vomiting    Current Facility-Administered Medications  Medication Dose Route Frequency Provider Last Rate Last Dose  .  stroke: mapping our early stages of recovery book   Does not apply Once Jerald Kief, MD      . acetaminophen (TYLENOL) tablet 650 mg  650 mg Oral Q6H PRN Starleen Arms, MD   650 mg at 01/02/16 1956   Or  . acetaminophen (TYLENOL) suppository 650 mg  650 mg Rectal Q6H PRN Starleen Arms, MD      . albuterol (PROVENTIL) (2.5 MG/3ML) 0.083% nebulizer solution 2.5 mg  2.5 mg Nebulization Q6H PRN Erick Blinks, MD   2.5 mg at 01/03/16 1211  . amLODipine (NORVASC) tablet 10 mg  10 mg Oral Daily Jeralyn Bennett, MD   10 mg at 01/07/16 1018  . antiseptic oral rinse (CPC / CETYLPYRIDINIUM CHLORIDE 0.05%) solution 7 mL  7 mL Mouth Rinse BID Starleen Arms, MD   7 mL at 01/07/16 1026  . aspirin chewable tablet 81 mg  81 mg Oral Daily Layne Benton, NP   81 mg at 01/07/16 1018  . brinzolamide (AZOPT) 1 % ophthalmic suspension 1 drop  1 drop Both Eyes TID Jeralyn Bennett, MD   1 drop at 01/07/16 1027   And  . brimonidine (ALPHAGAN) 0.2 % ophthalmic solution 1 drop  1 drop Both Eyes TID Jeralyn Bennett, MD   1 drop at 01/07/16 1033  . calcium-vitamin D (OSCAL WITH D) 500-200 MG-UNIT per tablet 1 tablet  1 tablet Oral Daily Starleen Arms, MD   1 tablet at 01/07/16 1018  . famotidine (PEPCID) tablet 20 mg  20 mg Oral Daily Jeralyn Bennett, MD   20 mg at 01/07/16 1018  .  feeding supplement (ENSURE ENLIVE) (ENSURE ENLIVE) liquid 237 mL  237 mL Oral BID BM Jeralyn Bennett, MD   237 mL at 01/07/16 1033  . gi cocktail (Maalox,Lidocaine,Donnatal)  30 mL Oral TID PRN Maretta Bees, MD   30 mL at 01/06/16 0754  . haloperidol lactate (HALDOL) injection 2 mg  2 mg Intravenous Q6H PRN Erick Blinks, MD      . heparin injection 5,000 Units  5,000 Units Subcutaneous 3 times per day Starleen Arms, MD   5,000 Units at 01/07/16 0606  . latanoprost (XALATAN) 0.005 % ophthalmic solution 1 drop  1 drop Both Eyes QHS Starleen Arms, MD   1 drop at 01/06/16 2106  . LORazepam (ATIVAN) injection 0.5 mg  0.5 mg Intravenous Q6H PRN Jeralyn Bennett, MD   0.5 mg at 01/06/16 1511  . metoCLOPramide (REGLAN) injection 10 mg  10 mg Intravenous 4 times per day Maretta Bees, MD   10 mg at 01/07/16 0606  . morphine 2 MG/ML injection 1 mg  1 mg Intravenous Q4H PRN Jerald Kief, MD   1 mg at 01/07/16 1017  . nafcillin 2 g in dextrose 5 % 100 mL IVPB  2 g Intravenous Q4H Erick Blinks, MD   2 g at 01/07/16 1018  . nitroGLYCERIN (NITROSTAT) SL tablet 0.4 mg  0.4 mg Sublingual Q5 min PRN Maretta Bees, MD      . nystatin (MYCOSTATIN) 100000 UNIT/ML suspension 500,000 Units  5 mL Oral QID Jeralyn Bennett, MD   500,000 Units at 01/07/16 1018  . ondansetron (ZOFRAN) 4 MG/5ML solution 4 mg  4 mg Oral Q6H PRN Leana Roe Elgergawy, MD      . pantoprazole (PROTONIX) injection 40 mg  40 mg Intravenous Q12H Maretta Bees, MD   40 mg at 01/07/16 1019  . polyvinyl alcohol (LIQUIFILM TEARS) 1.4 % ophthalmic solution 1 drop  1 drop Both Eyes TID PRN Starleen Arms, MD      . sertraline (ZOLOFT) tablet 100 mg  100 mg Oral Daily Jeralyn Bennett, MD   100 mg at 01/07/16 1018  . simvastatin (ZOCOR) tablet 10 mg  10 mg Oral QHS Marvel Plan, MD   10 mg at 01/06/16 2107    Prescriptions prior to admission  Medication Sig Dispense Refill Last Dose  .  aspirin 81 MG chewable tablet Chew 81 mg by mouth daily.   12/28/2015 at Unknown time  . atenolol (TENORMIN) 100 MG tablet Take 1 tablet by mouth daily.   12/28/2015 at 0900  . Biotin 5000 MCG CAPS Take 10,000 mcg by mouth daily.    12/28/2015 at Unknown time  . Calcium Carbonate-Vitamin D (CALCIUM + D) 600-200 MG-UNIT TABS Take 1 tablet by mouth daily.   12/28/2015 at Unknown time  . carboxymethylcellulose (REFRESH PLUS) 0.5 % SOLN Place 1 drop into both eyes 3 (three) times daily as needed (Dry Eyes).   12/28/2015 at Unknown time  . Cyanocobalamin (VITAMIN B-12 IJ) Inject as directed every 30 (thirty) days.   12/02/2015 at Unknown time  . latanoprost (XALATAN) 0.005 % ophthalmic solution Place 1 drop into both eyes at bedtime.   12/28/2015 at Unknown time  . losartan (COZAAR) 100 MG tablet Take 100 mg by mouth daily.  11 12/28/2015 at Unknown time  . Multiple Vitamins-Minerals (PRESERVISION AREDS PO) Take 1 tablet by mouth 2 (two) times daily.   12/28/2015 at Unknown time  . naproxen (NAPROSYN) 500 MG tablet Take 500 mg by  mouth daily. For pain   12/28/2015 at Unknown time  . ranitidine (ZANTAC) 150 MG tablet Take 150 mg by mouth 2 (two) times daily.   12/28/2015 at Unknown time  . sertraline (ZOLOFT) 100 MG tablet Take 100 mg by mouth daily.    12/28/2015 at Unknown time  . silver sulfADIAZINE (SILVADENE) 1 % cream Apply 1 application topically 2 (two) times daily.   Past Week at Unknown time  . SIMBRINZA 1-0.2 % SUSP Place 1 drop into both eyes 3 (three) times daily.   12/28/2015 at Unknown time  . simvastatin (ZOCOR) 10 MG tablet Take 10 mg by mouth at bedtime.   12/28/2015 at Unknown time  . traMADol (ULTRAM) 50 MG tablet Take one or two tablets by mouth every 6 hours as needed for pain 240 tablet 5 12/28/2015 at Unknown time    Family History  Problem Relation Age of Onset  . Heart disease Mother   . Cancer Father   . Kidney disease Brother   . Hypertension Daughter   . Hyperlipidemia Son       Review of Systems:      Cardiac Review of Systems: Y or N  Chest Pain [   n ]  Resting SOB [ n ] Exertional SOB  Cove.Etienne ]  Orthopnea [  ]   Pedal Edema [ n  ]    Palpitations [ n ] Syncope  [n  ]   Presyncope [n   ]  General Review of Systems: [Y] = yes [  ]=no Constitional: recent weight change [ n ]; anorexia [  ]; fatigue [  ]; nausea [  ]; night sweats [  ]; fever [  ]; or chills [  ]                                                               Dental: poor dentition[  ]; Last Dentist visit:   Eye : blurred vision [  ]; diplopia [   ]; vision changes [  ];  Amaurosis fugax[  ]; Resp: cough [  ];  wheezing[  ];  hemoptysis[  ]; shortness of breath[  ]; paroxysmal nocturnal dyspnea[  ]; dyspnea on exertion[  ]; or orthopnea[  ];  GI:  gallstones[  ], vomiting[  ];  dysphagia[  ]; melena[  ];  hematochezia [  ]; heartburn[  ];   Hx of  Colonoscopy[  ]; GU: kidney stones [  ]; hematuria[  ];   dysuria [  ];  nocturia[  ];  history of     obstruction [  ]; urinary frequency [  ]             Skin: rash, swelling[  ];, hair loss[  ];  peripheral edema[  ];  or itching[  ]; Musculosketetal: myalgias[  ];  joint swelling[  ];  joint erythema[  ];  joint pain[  ];  back pain[  ];  Heme/Lymph: bruising[  ];  bleeding[  ];  anemia[  ];  Neuro: TIA[  ];  headaches[  ];  stroke[y  ];  vertigo[  ];  seizures[  ];   paresthesias[  ];  difficulty walking[y  ];  Psych:depression[  ]; anxiety[  ];  Endocrine: diabetes[n  ];  thyroid dysfunction[  ];  Immunizations: Flu [  ]; Pneumococcal[  ];  Other:  Physical Exam: BP 171/77 mmHg  Pulse 102  Temp(Src) 97.5 F (36.4 C) (Oral)  Resp 14  Ht 5' 9.5" (1.765 m)  Wt 179 lb 0.2 oz (81.2 kg)  BMI 26.42 kg/m2  SpO2 94%   General appearance: alert, cooperative, appears older than stated age, distracted, fatigued and slowed mentation Head: Normocephalic, without obvious abnormality, atraumatic Neck: no adenopathy, no carotid bruit, no JVD, supple,  symmetrical, trachea midline and thyroid not enlarged, symmetric, no tenderness/mass/nodules Lymph nodes: Cervical, supraclavicular, and axillary nodes normal. Resp: diminished breath sounds bibasilar Back: symmetric, no curvature. ROM normal. No CVA tenderness. Cardio: regular rate and rhythm, S1, S2 normal, no murmur, click, rub or gallop GI: soft, non-tender; bowel sounds normal; no masses,  no organomegaly Extremities: Chronic site of drainage ball of left foot at site of previous great toe amputation Neurologic: Mental status: Patient is arousable and answers questions , able to relate some history but is easily confused about details   Diagnostic Studies & Laboratory data:     Recent Radiology Findings:   Ct Angio Neck W/cm &/or Wo/cm  01/06/2016  CLINICAL DATA:  Recent posterior fossa and left frontal lobe infarcts. Dull headache. Pain behind the right ear. EXAM: CT ANGIOGRAPHY HEAD AND NECK TECHNIQUE: Multidetector CT imaging of the head and neck was performed using the standard protocol during bolus administration of intravenous contrast. Multiplanar CT image reconstructions and MIPs were obtained to evaluate the vascular anatomy. Carotid stenosis measurements (when applicable) are obtained utilizing NASCET criteria, using the distal internal carotid diameter as the denominator. CONTRAST:  80mL OMNIPAQUE IOHEXOL 350 MG/ML SOLN COMPARISON:  MRI brain 01/04/2016. FINDINGS: CT HEAD Brain: Moderate periventricular and subcortical white matter hypo attenuation is again noted. The recent punctate infarcts are not evident by CT. The ventricles are of normal size. No significant extraaxial fluid collection is present. Calvarium and skull base: Within normal limits Paranasal sinuses: Clear Orbits: Bilateral lens replacements are present. The globes and orbits are otherwise intact. CTA NECK Aortic arch: A 3 vessel arch configuration is present. Atherosclerotic changes are present in the aortic arch.  There is no significant stenosis. Right carotid system: There is mild tortuosity of the cervical right ICA. The right carotid bifurcation is normal. There is proximal tortuosity of the cervical right ICA. There is some irregularity and beading of the mid cervical right ICA without a significant stenosis. Left carotid system: The left common carotid artery is mildly tortuous. There is no significant focal stenosis. There is no significant stenosis at the bifurcation. There is some irregularity and beading of the mid cervical left ICA without significant focal stenosis. Vertebral arteries:The vertebral arteries both originate from the subclavian arteries. Proximal tortuosity is present without significant stenosis. There is no focal stenosis of the vertebral arteries in the neck. Skeleton: Degenerative endplate change and osseous spurring is evident bilaterally at C6-7. There is slight degenerative anterolisthesis at C5-6. No focal lytic or blastic lesions are present. Other neck: No focal mucosal or submucosal lesions are present. The salivary glands are within normal limits. No significant adenopathy is present. There is some pleural thickening posteriorly in both lower lobes. Minimal atelectasis is present. Lungs are otherwise clear. The mediastinum is within normal limits. CTA HEAD Anterior circulation: Atherosclerotic changes are noted within the cavernous internal carotid arteries bilaterally. There is a 50% supraclinoid stenosis bilaterally relative to the terminal ICA. No other significant focal stenosis is  present. The right A1 segment is hypoplastic. There is a moderate stenosis of the mid right A1 segment. The left A1 segment is dominant in the anterior communicating artery is patent. The MCA bifurcations are intact bilaterally. Multiple mild segmental stenoses are present in the MCA and ACA branch vessels bilaterally. Moderate medium and distal small vessel stenoses are present in the MCA a bath branches  bilaterally without a significant proximal occlusion. Posterior circulation: The left vertebral artery is slightly dominant to the right. The PICA origins are visualized and normal. The basilar artery is intact. Both posterior cerebral arteries originate from the basilar tip. There is moderate attenuation of distal PCA branch vessels. A high-grade stenosis is present in the proximal inferior right P3 division. Venous sinuses: The dural sinuses are patent. The straight sinus and deep cerebral veins are intact. The cortical veins are within normal limits. Anatomic variants: None Delayed phase: The postcontrast images demonstrate no pathologic enhancement. IMPRESSION: 1. Punctate infarcts involving the left cerebellum and left frontal lobe are not visualized by CT. 2. No new or acute cortical infarct. 3. Moderate periventricular and subcortical white matter disease is again seen. 4. Atherosclerotic changes at the aortic arch without significant stenosis. 5. Approximately 50% stenosis within the supraclinoid internal carotid artery bilaterally. 6. Moderate medium and small vessel disease in both the anterior and posterior circulation. 7. Near occlusive stenosis in the inferior right P3 division. 8. Mild spondylosis of the cervical spine as described Electronically Signed   By: Marin Roberts M.D.   On: 01/06/2016 11:57   Mr Laqueta Jean ZO Contrast  01/04/2016  CLINICAL DATA:  82 year old female with altered mental status, not responding. Initial encounter. EXAM: MRI HEAD WITHOUT AND WITH CONTRAST TECHNIQUE: Multiplanar, multiecho pulse sequences of the brain and surrounding structures were obtained without and with intravenous contrast. CONTRAST:  16mL MULTIHANCE GADOBENATE DIMEGLUMINE 529 MG/ML IV SOLN COMPARISON:  Head CT without contrast 06/14/2014. Brain MRI, MRA a 03/13/2012. FINDINGS: About half a dozen punctate foci of restricted diffusion are demonstrated scattered in the bilateral hemispheres and superior  cerebellum. One of these areas is in the medial right occipital lobe PCA territory where there is suggestion of mild underlying cortical encephalomalacia (series 102, image 19). Left MCA, bilateral PCA, and superior cerebellar artery territories are affected. No acute intracranial hemorrhage identified. No midline shift, mass effect, or evidence of intracranial mass lesion. Major intracranial vascular flow voids are stable. Minimal to mild generalized cerebral volume loss since 2013. No ventriculomegaly, extra-axial collection or acute intracranial hemorrhage. Cervicomedullary junction and pituitary are within normal limits. Negative visualized cervical spine. Patchy and scattered bilateral cerebral white matter T2 and FLAIR hyperintensity is stable. No abnormal enhancement identified. Visible internal auditory structures appear normal. Mild chronic right mastoid effusion is unchanged. Nasopharynx remains normal. Trace paranasal sinus mucosal thickening. Chronic postoperative changes to both globes. Negative scalp soft tissues. IMPRESSION: 1. 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 Left MCA, bilateral PCA, and SCA territories are affected. Therefore, consider recent embolic event from the heart or proximal aorta (less likely posterior circulation only thromboembolism). 2. No associated hemorrhage or mass effect. 3. Otherwise no significant change in the MRI appearance of the brain since 2013. Electronically Signed   By: Odessa Fleming M.D.   On: 01/04/2016 09:49   Ct Abdomen Pelvis W Contrast  12/29/2015  CLINICAL DATA:  Right flank pain since January 25th. Negative renal stone protocol CT earlier today. Additional history of hypertension, hiatal hernia and  cholecystectomy. EXAM: CT ABDOMEN AND PELVIS WITH CONTRAST TECHNIQUE: Multidetector CT imaging of the abdomen and pelvis was performed using the standard protocol following bolus administration of intravenous contrast.  CONTRAST:  OMNIPAQUE IOHEXOL 300 MG/ML  SOLN COMPARISON:  Noncontrast abdomen and pelvis CT dated 12/29/2015. Comparison also made to CT abdomen pelvis dated 05/22/2013. FINDINGS: Lower chest: Mild dependent atelectasis at each lung base. Otherwise unremarkable. Hepatobiliary: Liver appears normal. Patient is status post cholecystectomy with associated mild bile duct ectasia. Pancreas: No mass, inflammatory changes, or other significant abnormality. Spleen: Upper normal in size. Adrenals/Urinary Tract: Adrenal glands are unremarkable. Questionable small stable nodules within each adrenal gland, of benign etiology. Kidneys appear normal without stone or hydronephrosis. No ureteral or bladder calculi identified. Bladder appears normal. Stomach/Bowel: Bowel is normal in caliber. No bowel wall thickening or evidence of bowel wall inflammation. Appendix is normal. Vascular/Lymphatic: Scattered mild atherosclerotic changes of the normal- caliber abdominal aorta. No acute vascular abnormality seen. No enlarged lymph nodes within the abdomen or pelvis. Reproductive: Unremarkable. Small amount of free fluid in the pelvis is likely physiologic in nature. Other: No significant free fluid. No abscess collection. No free intraperitoneal air. Musculoskeletal: Degenerative changes within the lumbar spine, stable in appearance compared to the previous CT of 05/22/2013. No acute- appearing osseous abnormality. 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 Superficial soft tissues are unremarkable. IMPRESSION: 1. No source for right flank pain identified, unless related to the chronic degenerative changes in the lumbar spine. No evidence of acute intra-abdominal or intrapelvic abnormality. 2. Additional incidental/chronic findings detailed above. Electronically Signed   By: Bary Richard M.D.   On: 12/29/2015 17:52   Mr Foot Left Wo Contrast  01/03/2016  CLINICAL DATA:   Bacteremia. Open wounds on the left foot with history of amputation. EXAM: MRI OF THE LEFT FOREFOOT WITHOUT CONTRAST TECHNIQUE: Multiplanar, multisequence MR imaging was performed. No intravenous contrast was administered. COMPARISON:  01/02/2016; 09/15/2014 FINDINGS: The patient refused IV contrast. Despite efforts by the technologist and patient, severe motion artifact is present on today's exam and could not be eliminated. This reduces exam sensitivity and specificity. First digit amputation at the MTP joint, with a approximately 1.5 by 0.8 by 0.8 cm fluid collection just plantar to the head of the first metatarsal as shown on images 27-32 of series 10, and surrounding abnormal inflammatory effacement of the adipose tissues. Abscess not excluded. Low signal intensity focus on image 33 series 9 possibly a tiny microscopic metallic particle or tiny focus of gas along the amputation margin. As on conventional radiographs, we demonstrate erosive findings in the head of the second and third metatarsals as well as in the bases of the proximal phalanges of the second and third toes. There is some low-level increased T2 signal along the third metatarsal head as on image 27 series 10 Low-level edema tracks along the plantar musculature of the foot. There is some mild subcutaneous edema dorsally in the foot. Degenerative findings along the Lisfranc joint. No Lisfranc joint malalignment identified. IMPRESSION: 1. Erosive findings at the second and third metatarsophalangeal joints are confirmed. This could represent infection, gout, or rheumatoid arthropathy. Particularly in the head of the third metatarsal, osteomyelitis is of greater suspicion. Today's images are severely degraded by motion artifact, and the patient refused IV contrast. 2. Fluid collection plantar to the head of the first metatarsal -abscess not excluded given the inflammatory effacement of the surrounding fatty subcutaneous tissues. Electronically Signed  By: Gaylyn Rong M.D.   On: 01/03/2016 08:41   US Arterial Seg Multiple  12/30/2015  CLINICAL DATA:  82 year old female with left foot cellulitis and constant bilateral lower extremity leg pain for the last 5 days EXAM: NONINVASIVE PHYSIOLOGIC VASCULAR STUDY OF BILATERAL LOWER EXTREMITIES TECHNIQUE: Evaluation of both lower extremities was performed at rest, including calculation of ankle-brachial indices, multiple segmental pressure evaluation, segmental Doppler and segmental pulse volume recording. COMPARISON:  None. FINDINGS: Right ABI:  1.2 Left ABI:  1.2 Right Lower Extremity: Normal triphasic arterial waveforms at the ankle. Limited utility of segmental blood pressures secondary to poor compressibility of the vessels. Significantly decreased arterial waveforms in the right second through fifth digits. Left Lower Extremity: Normal triphasic arterial waveforms at the ankle. Limited utility of segmental blood pressures secondary to poor compressibility of the vessels. Decreased amplitude of the arterial waveforms in the second through fifth digits. IMPRESSION: 1. Normal bilateral resting ankle-brachial indices and arterial waveforms at the ankles. No evidence of clinically significant large vessel peripheral arterial disease. 2. Abnormal digital waveforms bilaterally consistent with small vessel disease. Signed, Sterling Big, MD [facility] Electronically Signed   By: Malachy Moan M.D.   On: 12/30/2015 15:38     Ct Renal Stone Study  12/29/2015  CLINICAL DATA:  Right flank pain starting yesterday. Nausea vomiting. EXAM: CT ABDOMEN AND PELVIS WITHOUT CONTRAST TECHNIQUE: Multidetector CT imaging of the abdomen and pelvis was performed following the standard protocol without IV contrast. COMPARISON:  None. FINDINGS: Lower chest:  Lung bases are clear. Hepatobiliary: Normal liver.  Prior cholecystectomy. Pancreas: Normal. Spleen:  Normal. Adrenals/Urinary Tract: Normal adrenal glands. Normal kidneys. Normal bladder. No urolithiasis or obstructive uropathy. Incidental note is made if a right extrarenal pelvis. Stomach/Bowel: No bowel wall thickening or dilatation. No pneumatosis, pneumoperitoneum or portal venous gas. No abdominal or pelvic free fluid. Vascular/Lymphatic: Normal caliber abdominal aorta. Mild abdominal aortic atherosclerosis. No lymphadenopathy. Reproductive: Normal uterus.  No adnexal mass. Other: No fluid collection or hematoma. Fat containing right inguinal hernia. Musculoskeletal: No lytic or sclerotic osseous lesion. Degenerative disc disease and facet arthropathy throughout the lumbar spine most severe at L4-5. IMPRESSION: 1. No urolithiasis or obstructive uropathy. Electronically Signed   By: Elige Ko   On: 12/29/2015 08:57    Ct Angio Head W/cm &/or Wo Cm  01/06/2016  CLINICAL DATA:  Recent posterior fossa and left frontal lobe infarcts. Dull headache. Pain behind the right ear. EXAM: CT ANGIOGRAPHY HEAD AND NECK TECHNIQUE: Multidetector CT imaging of the head and neck was performed using the standard protocol during bolus administration of intravenous contrast. Multiplanar CT image reconstructions and MIPs were obtained to evaluate the vascular anatomy. Carotid stenosis measurements (when applicable) are obtained utilizing NASCET criteria, using the distal internal carotid diameter as the denominator. CONTRAST:  80mL OMNIPAQUE IOHEXOL 350 MG/ML SOLN COMPARISON:  MRI brain 01/04/2016. FINDINGS: CT HEAD Brain: Moderate periventricular and subcortical white matter hypo attenuation is again noted. The recent punctate infarcts are not evident by CT. The ventricles are of normal size. No significant extraaxial fluid collection is present. Calvarium and skull base: Within normal limits Paranasal sinuses: Clear Orbits: Bilateral lens replacements are present. The globes and orbits are otherwise intact. CTA NECK Aortic  arch: A 3 vessel arch configuration is present. Atherosclerotic changes are present in the aortic arch. There is no significant stenosis. Right carotid system: There is mild tortuosity of the cervical right ICA. The right carotid bifurcation  is normal. There is proximal tortuosity of the cervical right ICA. There is some irregularity and beading of the mid cervical right ICA without a significant stenosis. Left carotid system: The left common carotid artery is mildly tortuous. There is no significant focal stenosis. There is no significant stenosis at the bifurcation. There is some irregularity and beading of the mid cervical left ICA without significant focal stenosis. Vertebral arteries:The vertebral arteries both originate from the subclavian arteries. Proximal tortuosity is present without significant stenosis. There is no focal stenosis of the vertebral arteries in the neck. Skeleton: Degenerative endplate change and osseous spurring is evident bilaterally at C6-7. There is slight degenerative anterolisthesis at C5-6. No focal lytic or blastic lesions are present. Other neck: No focal mucosal or submucosal lesions are present. The salivary glands are within normal limits. No significant adenopathy is present. There is some pleural thickening posteriorly in both lower lobes. Minimal atelectasis is present. Lungs are otherwise clear. The mediastinum is within normal limits. CTA HEAD Anterior circulation: Atherosclerotic changes are noted within the cavernous internal carotid arteries bilaterally. There is a 50% supraclinoid stenosis bilaterally relative to the terminal ICA. No other significant focal stenosis is present. The right A1 segment is hypoplastic. There is a moderate stenosis of the mid right A1 segment. The left A1 segment is dominant in the anterior communicating artery is patent. The MCA bifurcations are intact bilaterally. Multiple mild segmental stenoses are present in the MCA and ACA branch  vessels bilaterally. Moderate medium and distal small vessel stenoses are present in the MCA a bath branches bilaterally without a significant proximal occlusion. Posterior circulation: The left vertebral artery is slightly dominant to the right. The PICA origins are visualized and normal. The basilar artery is intact. Both posterior cerebral arteries originate from the basilar tip. There is moderate attenuation of distal PCA branch vessels. A high-grade stenosis is present in the proximal inferior right P3 division. Venous sinuses: The dural sinuses are patent. The straight sinus and deep cerebral veins are intact. The cortical veins are within normal limits. Anatomic variants: None Delayed phase: The postcontrast images demonstrate no pathologic enhancement. IMPRESSION: 1. Punctate infarcts involving the left cerebellum and left frontal lobe are not visualized by CT. 2. No new or acute cortical infarct. 3. Moderate periventricular and subcortical white matter disease is again seen. 4. Atherosclerotic changes at the aortic arch without significant stenosis. 5. Approximately 50% stenosis within the supraclinoid internal carotid artery bilaterally. 6. Moderate medium and small vessel disease in both the anterior and posterior circulation. 7. Near occlusive stenosis in the inferior right P3 division. 8. Mild spondylosis of the cervical spine as described Electronically Signed   By: Marin Roberts M.D.   On: 01/06/2016 11:57    Dg Chest Port 1 View  01/06/2016  CLINICAL DATA:  Chest pain EXAM: PORTABLE CHEST 1 VIEW COMPARISON:  01/01/2016 FINDINGS: Cardiac shadow is within normal limits. Elevation of the right hemidiaphragm is again noted. Mild rotation to the left is noted accentuating the mediastinal markings. No focal infiltrate is seen. No acute bony abnormality seen. IMPRESSION: No acute abnormality noted. Electronically Signed   By: Alcide Clever M.D.   On: 01/06/2016 09:02     I have independently  reviewed the above radiologic studies.  Recent Lab Findings: Lab Results  Component Value Date   WBC 20.8* 01/07/2016   HGB 10.8* 01/07/2016   HCT 32.5* 01/07/2016   PLT 299 01/07/2016   GLUCOSE 115* 01/07/2016   CHOL 132 01/06/2016  TRIG 209* 01/06/2016   HDL 19* 01/06/2016   LDLCALC 71 01/06/2016   ALT 20 12/31/2015   AST 24 12/31/2015   NA 142 01/07/2016   K 2.9* 01/07/2016   CL 106 01/07/2016   CREATININE 0.96 01/07/2016   BUN 31* 01/07/2016   CO2 22 01/07/2016   TSH 3.180 09/19/2014   HGBA1C 6.0* 01/06/2016   ECHO: on 1/29 Study Conclusions  - Left ventricle: The cavity size was normal. Systolic function was vigorous. The estimated ejection fraction was in the range of 65% to 70%. Wall motion was normal; there were no regional wall motion abnormalities. Doppler parameters are consistent with abnormal left ventricular relaxation (grade 1 diastolic dysfunction). There was no evidence of elevated ventricular filling pressure by Doppler parameters. - Aortic valve: Trileaflet; normal thickness leaflets. There was no regurgitation. - Aortic root: The aortic root was normal in size. - Mitral valve: Structurally normal valve. There was mild regurgitation. - Left atrium: The atrium was mildly dilated. - Right ventricle: Systolic function was normal. - Tricuspid valve: There was mild regurgitation. - Pulmonic valve: There was no regurgitation. - Pulmonary arteries: Systolic pressure was moderately increased. PA peak pressure: 48 mm Hg (S). - Inferior vena cava: The vessel was normal in size. - Pericardium, extracardiac: There was no pericardial effusion.  Impressions:  - No obvious vegetation was identified. If suspicion high consider a TEE fo further evaluation.  ECHO 01/05/2016 Study Conclusions  - Left ventricle: The cavity size was normal. Wall thickness was normal. Systolic function was normal. The estimated ejection fraction was in  the range of 60% to 65%. Wall motion was normal; there were no regional wall motion abnormalities. - Aortic valve: No evidence of vegetation. There was trivial regurgitation. - Mitral valve: Mild, late systolicprolapse. There was a large, 2.3 cm (L) x 0.5 cm (W), strand-like, loosely organized, highly mobile vegetation on the atrial aspect of the posterior leaflet. P3 (medial) scallop. There was moderate regurgitation directed centrally. - Left atrium: No evidence of thrombus in the atrial cavity or appendage. - Right atrium: No evidence of thrombus in the atrial cavity or appendage. - Atrial septum: There was an atrial septal aneurysm. - Tricuspid valve: No evidence of vegetation. There was moderate regurgitation directed centrally. - Pulmonic valve: No evidence of vegetation.  Diagnostic transesophageal echocardiography. 2D and color Doppler. Birthdate: Patient birthdate: 14-May-1933. Age: Patient is 82 yr old. Sex: Gender: female. Blood pressure:   203/97 Patient status: Inpatient. Study date: Study date: 01/05/2016. Study time: 02:33 PM. Location: Endoscopy.  -------------------------------------------------------------------  ------------------------------------------------------------------- Left ventricle: The cavity size was normal. Wall thickness was normal. Systolic function was normal. The estimated ejection fraction was in the range of 60% to 65%. Wall motion was normal; there were no regional wall motion abnormalities.  ------------------------------------------------------------------- Aortic valve:  Structurally normal valve. Trileaflet; normal thickness leaflets. Cusp separation was normal. No evidence of vegetation. Doppler: There was trivial regurgitation.  ------------------------------------------------------------------- Aorta: The aorta was normal, not dilated, and non-diseased. There was no atheroma. There was no  evidence for dissection. Aortic root: The aortic root was not dilated. Ascending aorta: The ascending aorta was normal in size. Aortic arch: The aortic arch was normal in size. There was minimal atheroma. Descending aorta: The descending aorta was normal in size.  ------------------------------------------------------------------- Mitral valve:  Mildly thickened leaflets . Leaflet separation was normal. Mild, late systolicprolapse. There was a large, 2.3 cm (L) x 0.5 cm (W), strand-like, loosely organized, highly mobile vegetation on the atrial aspect of  the posterior leaflet. Doppler: There was moderate regurgitation directed centrally.  ------------------------------------------------------------------- Left atrium: The atrium was normal in size. No evidence of thrombus in the atrial cavity or appendage. The appendage was morphologically a left appendage, multilobulated, and of normal size.  ------------------------------------------------------------------- Atrial septum: There was an atrial septal aneurysm.  ------------------------------------------------------------------- Right ventricle: The cavity size was normal. Wall thickness was normal. Systolic function was normal.  ------------------------------------------------------------------- Pulmonic valve:  Structurally normal valve.  Cusp separation was normal. No evidence of vegetation.  ------------------------------------------------------------------- Tricuspid valve:  Structurally normal valve.  Leaflet separation was normal. No evidence of vegetation. Doppler: There was moderate regurgitation directed centrally.  ------------------------------------------------------------------- Pulmonary artery:  The main pulmonary artery was normal-sized.  ------------------------------------------------------------------- Right atrium: The atrium was normal in size. No evidence of thrombus in the atrial  cavity or appendage. The appendage was morphologically a right appendage.  ------------------------------------------------------------------- Pericardium: There was no pericardial effusion.  ------------------------------------------------------------------- Post procedure conclusions Ascending Aorta:  - The aorta was normal, not dilated, and non-diseased.  ------------------------------------------------------------------- Measurements  Pulmonary arteries      Value    Reference PA pressure, S, DP      30  mm Hg <=30  Legend: (L) and (H) mark values outside specified reference range.  ------------------------------------------------------------------- Prepared and Electronically Authenticated by  Thurmon Fair, MD 2017-02-02T16:44:16  Recent Results (from the past 720 hour(s))  C difficile quick scan w PCR reflex     Status: None   Collection Time: 12/30/15 12:28 AM  Result Value Ref Range Status   C Diff antigen NEGATIVE NEGATIVE Final   C Diff toxin NEGATIVE NEGATIVE Final   C Diff interpretation Negative for toxigenic C. difficile  Final    Comment: NEGATIVE  Culture, blood (routine x 2)     Status: None   Collection Time: 12/31/15 12:16 PM  Result Value Ref Range Status   Specimen Description BLOOD RIGHT ARM  Final   Special Requests BOTTLES DRAWN AEROBIC AND ANAEROBIC 6CC  Final   Culture  Setup Time   Final    GRAM POSITIVE COCCI IN CLUSTERS IN BOTH AEROBIC AND ANAEROBIC BOTTLES CRITICAL RESULT CALLED TO, READ BACK BY AND VERIFIED WITH: Carnella Guadalajara, J. AT 0035 ON 01/01/2016 BY AGUNDIZ,E.    Culture   Final    STAPHYLOCOCCUS AUREUS Performed at [facility]    Report Status 01/03/2016 FINAL  Final   Organism ID, Bacteria STAPHYLOCOCCUS AUREUS  Final      Susceptibility   Staphylococcus aureus - MIC*    CIPROFLOXACIN <=0.5 SENSITIVE Sensitive     ERYTHROMYCIN <=0.25 SENSITIVE Sensitive     GENTAMICIN <=0.5 SENSITIVE Sensitive      OXACILLIN <=0.25 SENSITIVE Sensitive     TETRACYCLINE <=1 SENSITIVE Sensitive     VANCOMYCIN <=0.5 SENSITIVE Sensitive     TRIMETH/SULFA <=10 SENSITIVE Sensitive     CLINDAMYCIN <=0.25 SENSITIVE Sensitive     RIFAMPIN <=0.5 SENSITIVE Sensitive     Inducible Clindamycin NEGATIVE Sensitive     * STAPHYLOCOCCUS AUREUS  Culture, blood (routine x 2)     Status: None   Collection Time: 12/31/15 12:33 PM  Result Value Ref Range Status   Specimen Description BLOOD RIGHT ARM  Final   Special Requests BOTTLES DRAWN AEROBIC AND ANAEROBIC 6CC  Final   Culture  Setup Time   Final    GRAM POSITIVE COCCI IN CLUSTERS IN BOTH AEROBIC AND ANAEROBIC BOTTLES CRITICAL RESULT CALLED TO, READ BACK BY AND VERIFIED WITH: LAVINDER,J. AT 4098 ON 01/01/2016  BY AGUNDIZ,E.    Culture   Final    STAPHYLOCOCCUS AUREUS SUSCEPTIBILITIES PERFORMED ON PREVIOUS CULTURE WITHIN THE LAST 5 DAYS. Performed at [facility]    Report Status 01/03/2016 FINAL  Final  Culture, blood (Routine X 2) w Reflex to ID Panel     Status: None (Preliminary result)   Collection Time: 01/02/16  2:11 PM  Result Value Ref Range Status   Specimen Description BLOOD RIGHT ANTECUBITAL  Final   Special Requests   Final    BOTTLES DRAWN AEROBIC AND ANAEROBIC 6CC EACH  IMMUNE:COMPROMISED   Culture NO GROWTH 4 DAYS  Final   Report Status PENDING  Incomplete  Culture, blood (Routine X 2) w Reflex to ID Panel     Status: None (Preliminary result)   Collection Time: 01/02/16  2:13 PM  Result Value Ref Range Status   Specimen Description BLOOD RIGHT HAND  Final   Special Requests   Final    BOTTLES DRAWN AEROBIC AND ANAEROBIC 6CC  IMMUNE:COMPROMISED   Culture NO GROWTH 4 DAYS  Final   Report Status PENDING  Incomplete  Wound culture     Status: None   Collection Time: 01/03/16  6:43 PM  Result Value Ref Range Status   Specimen Description WOUND LEFT FOOT  Final   Special Requests NONE  Final   Gram Stain   Final    NO WBC SEEN NO  SQUAMOUS EPITHELIAL CELLS SEEN NO ORGANISMS SEEN Performed at [facility]    Culture   Final    MODERATE STAPHYLOCOCCUS AUREUS Note: RIFAMPIN AND GENTAMICIN SHOULD NOT BE USED AS SINGLE DRUGS FOR TREATMENT OF STAPH INFECTIONS. Performed at [facility]    Report Status 01/06/2016 FINAL  Final   Organism ID, Bacteria STAPHYLOCOCCUS AUREUS  Final      Susceptibility   Staphylococcus aureus - MIC*    CLINDAMYCIN <=0.25 SENSITIVE Sensitive     ERYTHROMYCIN <=0.25 SENSITIVE Sensitive     GENTAMICIN <=0.5 SENSITIVE Sensitive     LEVOFLOXACIN <=0.12 SENSITIVE Sensitive     OXACILLIN <=0.25 SENSITIVE Sensitive     RIFAMPIN <=0.5 SENSITIVE Sensitive     TRIMETH/SULFA <=10 SENSITIVE Sensitive     VANCOMYCIN 1 SENSITIVE Sensitive     TETRACYCLINE <=1 SENSITIVE Sensitive     MOXIFLOXACIN <=0.25 SENSITIVE Sensitive     * MODERATE STAPHYLOCOCCUS AUREUS  Culture, blood (routine x 2)     Status: None (Preliminary result)   Collection Time: 01/05/16  8:06 PM  Result Value Ref Range Status   Specimen Description BLOOD RIGHT ARM  Final   Special Requests IN PEDIATRIC BOTTLE 4CC  Final   Culture  Setup Time   Final    GRAM POSITIVE COCCI IN CLUSTERS AEROBIC BOTTLE ONLY CRITICAL RESULT CALLED TO, READ BACK BY AND VERIFIED WITH: Claria Dice RN 1556 01/06/16 A BROWNING    Culture STAPHYLOCOCCUS AUREUS  Final   Report Status PENDING  Incomplete  Culture, blood (routine x 2)     Status: None (Preliminary result)   Collection Time: 01/05/16  8:08 PM  Result Value Ref Range Status   Specimen Description BLOOD RIGHT HAND  Final   Special Requests BOTTLES DRAWN AEROBIC ONLY 9CC  Final   Culture NO GROWTH < 24 HOURS  Final   Report Status PENDING  Incomplete    Assessment / Plan:   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   At this point the patient does not have indication for emergency surgery , she does not have significant heart failure symptoms, or evidence of valve dehiscence perforation or rupture , although there is a vegetation its greatest diameter is 5 mm She has been without fever. The greatest concern, his persistent bacteremia 2 blood cultures were obtained on February 2 one has no growth in one is growing staph aureus  If we proceed with surgery she would need evaluation of her coronary arteries  Generally accepted indications for surgical treatment of endocarditis:  Valve abnormalities or regurgitation resulting in congestive heart failure Microorganisms that are not controlled by antimicrobial therapy (fungal) Endocarditis leading to valve dehiscence, perforation, rupture or fistula or large perivalvular abscess, recurrent emboli Persistent vegetation or fever/bacteremia despite optimal treatment vegetations  that are mobile and larger then>10 mm in diameter on the mitral valve vegetations that are increasing in size despite antimicrobial therapy Mitral "kissing" vegetations  I discussed with the patient and her granddaughter in detail the current situation, including the indications for surgery and the contraindications. The recent stroke IVC complicates it increases the risk of intracranial bleed with cardiac surgery.   I  spent 60 minutes counseling the patient face to face and 50% or more the  time was spent in counseling and coordination of care. The total time spent in the appointment was 80 minutes.    Delight Ovens MD      [address] [place] [place] [address] Office  [phone]   Beeper [phone]  01/07/2016 11:49 AM

## 2016-01-06 NOTE — Consult Note (Signed)
Physical Medicine and Rehabilitation Consult Reason for Consult: Multiple scattered punctate acute lacunar infarcts. Left MCA, bilateral PCA and SCA territories affected as well as sepsis Referring Physician: [facility]   HPI: Cassandra [name] is a 82 y.o. right handed female with history of hypertension, hyperlipidemia, left lower extremity chronic ulceration status post first digit amputation. Patient lives with 21 year old spouse and patient reported to be independent prior to admission. One level home with basement. 2 daughters in the area that check on patient  as needed Presented to [facility] hospital 12/29/2015 with 2 day history of nausea vomiting and right flank pain with generalized weakness. Workup ongoing for possible gastroenteritis. CT of abdomen and pelvis showed no evidence of acute intra-abdominal abnormality. Patient with increasing lethargy and somnolence. He was transferred to [facility] for ongoing evaluation. MRI of the brain showed multiple scattered punctate acute lacunar infarcts in the brain. No associated hemorrhage or mass effect. Echocardiogram with ejection fraction of 70% grade 1 diastolic dysfunction. No regional wall motion abnormalities. Blood cultures completed from 12/31/2015 growing MSSA, white blood cell count 25,200. Patient spiked a low-grade fever of 101.7. Placed on broad-spectrum antibiotics. MRI of left foot showed possible underlying osteomyelitis could not rule out underlying abscess. TEE completed showing very large and hypermobile mitral valve vegetation. Infectious disease consulted presently on nafcillin await duration of antibiotic. CT angio head and neck are pending. Presently on aspirin for CVA prophylaxis. Subcutaneous heparin for DVT prophylaxis. Physical therapy evaluation completed with recommendations of physical medicine rehabilitation consult.   Review of Systems  Constitutional: Positive for fever. Negative for chills.  HENT:  Negative for hearing loss.   Eyes: Negative for blurred vision and double vision.  Respiratory: Negative for cough and shortness of breath.   Cardiovascular: Positive for palpitations and leg swelling. Negative for chest pain.  Gastrointestinal: Positive for nausea, vomiting and constipation.  Musculoskeletal: Positive for myalgias and joint pain.  Skin: Negative for rash.  Neurological: Positive for weakness. Negative for seizures, loss of consciousness and headaches.  Psychiatric/Behavioral: Positive for depression.  All other systems reviewed and are negative.  Past Medical History  Diagnosis Date  . Hypertension   . Arthritis   . Depression   . Hyperlipidemia   . PFO (patent foramen ovale)     small  . H/O echocardiogram 2008    prolapse of post. mitral leaflet, tr MR, tr TR, EF >55%, mild concentric LVH  . H/O cardiovascular stress test 10/13/2007    low risk scan, no changes from previous test  . Heart palpitations     controlled on BB  . Hiatal hernia   . Cellulitis and abscess of toe of left foot 09/16/2014   Past Surgical History  Procedure Laterality Date  . Cholecystectomy    . Cardiac catheterization  10/2002    normal coronary arteries, normal LV function  . Incision and drainage Right 07/21/2014    Procedure: INCISION AND DRAINAGE;  Surgeon: Dallas Schimke, DPM;  Location: [facility];  Service: Podiatry;  Laterality: Right;  . Incision and drainage Left 09/17/2014    Procedure: INCISION AND DRAINAGE 1ST TOE LEFT FOOT;  Surgeon: Dallas Schimke, DPM;  Location: [facility];  Service: Podiatry;  Laterality: Left;  . Bone biopsy Left 09/17/2014    Procedure: BONE BIOPSY;  Surgeon: Dallas Schimke, DPM;  Location: [facility];  Service: Podiatry;  Laterality: Left;  . Amputation Left 09/20/2014    Procedure: AMPUTATION DIGIT (LEFT GREAT  TOE);  Surgeon: Dallas Schimke, DPM;  Location: [facility];  Service: Podiatry;  Laterality: Left;   Family History    Problem Relation Age of Onset  . Heart disease Mother   . Cancer Father   . Kidney disease Brother   . Hypertension Daughter   . Hyperlipidemia Son    Social History:  reports that she has never smoked. She has never used smokeless tobacco. She reports that she does not drink alcohol or use illicit drugs. Allergies:  Allergies  Allergen Reactions  . Codeine Shortness Of Breath and Nausea And Vomiting  . Hydrocodone Shortness Of Breath and Nausea And Vomiting   Medications Prior to Admission  Medication Sig Dispense Refill  . aspirin 81 MG chewable tablet Chew 81 mg by mouth daily.    [name] atenolol (TENORMIN) 100 MG tablet Take 1 tablet by mouth daily.    . Biotin 5000 MCG CAPS Take 10,000 mcg by mouth daily.     . Calcium Carbonate-Vitamin D (CALCIUM + D) 600-200 MG-UNIT TABS Take 1 tablet by mouth daily.    . carboxymethylcellulose (REFRESH PLUS) 0.5 % SOLN Place 1 drop into both eyes 3 (three) times daily as needed (Dry Eyes).    . Cyanocobalamin (VITAMIN B-12 IJ) Inject as directed every 30 (thirty) days.    [name] latanoprost (XALATAN) 0.005 % ophthalmic solution Place 1 drop into both eyes at bedtime.    [name] losartan (COZAAR) 100 MG tablet Take 100 mg by mouth daily.  11  . Multiple Vitamins-Minerals (PRESERVISION AREDS PO) Take 1 tablet by mouth 2 (two) times daily.    . naproxen (NAPROSYN) 500 MG tablet Take 500 mg by mouth daily. For pain    . ranitidine (ZANTAC) 150 MG tablet Take 150 mg by mouth 2 (two) times daily.    . sertraline (ZOLOFT) 100 MG tablet Take 100 mg by mouth daily.     . silver sulfADIAZINE (SILVADENE) 1 % cream Apply 1 application topically 2 (two) times daily.    [name] SIMBRINZA 1-0.2 % SUSP Place 1 drop into both eyes 3 (three) times daily.    . simvastatin (ZOCOR) 10 MG tablet Take 10 mg by mouth at bedtime.    . traMADol (ULTRAM) 50 MG tablet Take one or two tablets by mouth every 6 hours as needed for pain 240 tablet 5    Home: Home Living Family/patient  expects to be discharged to:: Private residence Living Arrangements: Spouse/significant other Available Help at Discharge: Family Type of Home: House Home Access: Stairs to enter [employer] of Steps: 5 steps on front porch c 1 handrail  Entrance Stairs-Rails: Left Home Layout: Laundry or work area in basement, Two level Alternate Level Stairs-Number of Steps: Washer/dryer  in basement with full flight down. Pt was doing her own laundry prior to this hospitialization per her grand daughters report Bathroom Shower/Tub: Engineer, manufacturing systems: Standard Home Equipment: Environmental consultant - 2 wheels Additional Comments: Tub shower  Functional History: Prior Function Level of Independence: Independent Comments: Pt does all ADL's, functional mobility w/o AD and drives prior to this hospitialization. Functional Status:  Mobility: Bed Mobility Overal bed mobility: +2 for physical assistance, Needs Assistance Bed Mobility: Rolling, Sidelying to Sit, Sit to Supine Rolling: +2 for physical assistance, Min assist Sidelying to sit: Mod assist, +2 for physical assistance, +2 for safety/equipment, HOB elevated Supine to sit: +2 for safety/equipment, Mod assist, HOB elevated Sit to supine: +2 for physical assistance, +2 for safety/equipment, Max assist General bed  mobility comments: Assist provided at trunk and LEs. Patient assisting with UEs with supine to sit.  Transfers Overall transfer level: Needs assistance Equipment used: 1 person hand held assist (none, dependent style transfer. ) Transfers: Sit to/from Stand Sit to Stand: Total assist, +2 physical assistance, From elevated surface General transfer comment: Unable to achieve full standing position with +2 total assist. Attempting with/without rw. Noted patient not assisting with LEs with attempts to stand. No change with or without rw.       ADL: ADL Overall ADL's : Needs assistance/impaired Grooming: Wash/dry hands, Set up,  Bed level Upper Body Bathing: Maximal assistance, Bed level, Cueing for sequencing Lower Body Bathing: Maximal assistance, Bed level, Cueing for sequencing, Cueing for safety Upper Body Dressing : Moderate assistance, Bed level, Cueing for sequencing, Cueing for safety Lower Body Dressing: Total assistance, Bed level, +2 for physical assistance, Cueing for safety, Cueing for sequencing Toilet Transfer Details (indicate cue type and reason): Pt currently unable to perform SPT w/ +2 physical assist. Attempted x3 w/ and w/o RW - pt noted to "sit" when transfers were initiated also pushing back into posterior lean. Toileting- Clothing Manipulation and Hygiene: Total assistance, +2 for physical assistance, +2 for safety/equipment, Bed level Functional mobility during ADLs:  (Pt unable to perform safe transfer Pt currently unable to perform SPT w/ +2 physical assist. Attempted x3 w/ and w/o RW - pt noted to "sit" when transfers were initiated also pushing back into posterior lean.) General ADL Comments: Pt currently demonstrates significant deficits in ability to perform functional mobillity and transfers related to ADL's. She is currently +2 Mod A/physical assist for bed mobility and attempted sit to stand for SPT to chair x3 Pt currently unable to perform SPT w/ +2 physical assist. Attempted x3 w/ and w/o RW - pt noted to "sit" when transfers were initiated also pushing back into posterior lean. OT/PT assisted pt back to bed. She will benefit from acute OT followed by in-pt Rehab  as she was living alone and independent  prior to this hospitialization.  Cognition: Cognition Overall Cognitive Status: Impaired/Different from baseline Orientation Level: Oriented to person Cognition Arousal/Alertness: Awake/alert Behavior During Therapy: Flat affect, Anxious Overall Cognitive Status: Impaired/Different from baseline Area of Impairment: Safety/judgement, Memory, Attention, Problem solving Memory: Decreased  short-term memory Following Commands: Follows one step commands inconsistently Safety/Judgement: Decreased awareness of safety Problem Solving: Slow processing, Decreased initiation, Difficulty sequencing, Requires verbal cues, Requires tactile cues Difficult to assess due to: Impaired communication (Difficulty with word finding; cont to assess in functional context)  Blood pressure 186/82, pulse 84, temperature 97.5 F (36.4 C), temperature source Oral, resp. rate 20, height 5' 9.5" (1.765 m), weight 81.2 kg (179 lb 0.2 oz), SpO2 95 %. Physical Exam  Constitutional: She is oriented to person, place, and time.  HENT:  Head: Normocephalic.  Eyes: EOM are normal.  Neck: Normal range of motion. Neck supple. No thyromegaly present.  Cardiovascular: Normal rate and regular rhythm.   Respiratory: Effort normal and breath sounds normal. No respiratory distress.  GI: Soft. Bowel sounds are normal. There is no tenderness.  Neurological: She is alert and oriented to person, place, and time.  Follow simple commands. Receptive and Expressive language deficits. Delays in processing. Moves all 4's but inconsistent. ?sensation decreased in stocking glove distribution  Skin: Skin is warm and dry.  Left foot dressed with kerlix/dressing  Psychiatric:  Flat, irritable at times.     No results found for this or any previous  visit (from the past 24 hour(s)). [name] Contrast  01/04/2016  CLINICAL DATA:  82 year old female with altered mental status, not responding. Initial encounter. EXAM: MRI HEAD WITHOUT AND WITH CONTRAST TECHNIQUE: Multiplanar, multiecho pulse sequences of the brain and surrounding structures were obtained without and with intravenous contrast. CONTRAST:  16mL MULTIHANCE GADOBENATE DIMEGLUMINE 529 MG/ML IV SOLN COMPARISON:  Head CT without contrast 06/14/2014. Brain MRI, MRA a 03/13/2012. FINDINGS: About half a dozen punctate foci of restricted diffusion are demonstrated scattered in  the bilateral hemispheres and superior cerebellum. One of these areas is in the medial right occipital lobe PCA territory where there is suggestion of mild underlying cortical encephalomalacia (series 102, image 19). Left MCA, bilateral PCA, and superior cerebellar artery territories are affected. No acute intracranial hemorrhage identified. No midline shift, mass effect, or evidence of intracranial mass lesion. Major intracranial vascular flow voids are stable. Minimal to mild generalized cerebral volume loss since 2013. No ventriculomegaly, extra-axial collection or acute intracranial hemorrhage. Cervicomedullary junction and pituitary are within normal limits. Negative visualized cervical spine. Patchy and scattered bilateral cerebral white matter T2 and FLAIR hyperintensity is stable. No abnormal enhancement identified. Visible internal auditory structures appear normal. Mild chronic right mastoid effusion is unchanged. Nasopharynx remains normal. Trace paranasal sinus mucosal thickening. Chronic postoperative changes to both globes. Negative scalp soft tissues. IMPRESSION: 1. 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 Left MCA, bilateral PCA, and SCA territories are affected. Therefore, consider recent embolic event from the heart or proximal aorta (less likely posterior circulation only thromboembolism). 2. No associated hemorrhage or mass effect. 3. Otherwise no significant change in the MRI appearance of the brain since 2013. Electronically Signed   By: Odessa Fleming M.D.   On: 01/04/2016 09:49    Assessment/Plan: Diagnosis: multiple lacunar infarcts, left foot osteomyelitis with gait, balance, language and swallowing deficits 1. Does the need for close, 24 hr/day medical supervision in concert with the patient's rehab needs make it unreasonable for this patient to be served in a less intensive setting? Yes 2. Co-Morbidities requiring supervision/potential  complications: htn 3. Due to bladder management, bowel management, safety, skin/wound care, disease management, medication administration, pain management and patient education, does the patient require 24 hr/day rehab nursing? Yes 4. Does the patient require coordinated care of a physician, rehab nurse, PT (1-2 hrs/day, 5 days/week), OT (1-2 hrs/day, 5 days/week) and SLP (1-2 hrs/day, 5 days/week) to address physical and functional deficits in the context of the above medical diagnosis(es)? Yes Addressing deficits in the following areas: balance, endurance, locomotion, strength, transferring, bowel/bladder control, bathing, dressing, feeding, grooming, toileting, cognition, speech, language, swallowing and psychosocial support 5. Can the patient actively participate in an intensive therapy program of at least 3 hrs of therapy per day at least 5 days per week? Yes 6. The potential for patient to make measurable gains while on inpatient rehab is excellent 7. Anticipated functional outcomes upon discharge from inpatient rehab are supervision and min assist  with PT, supervision and min assist with OT, supervision and min assist with SLP. 8. Estimated rehab length of stay to reach the above functional goals is: 14-20 days 9. Does the patient have adequate social supports and living environment to accommodate these discharge functional goals? Yes 10. Anticipated D/C setting: Home 11. Anticipated post D/C treatments: HH therapy 12. Overall Rehab/Functional Prognosis: excellent  RECOMMENDATIONS: This patient's condition is appropriate for continued rehabilitative care in the following setting: CIR Patient has agreed to  participate in recommended program. N/A Note that insurance prior authorization may be required for reimbursement for recommended care.  Comment: Rehab Admissions Coordinator to follow up.  Thanks,  Ranelle Oyster, MD, [place] [facility]     01/06/2016

## 2016-01-06 NOTE — Progress Notes (Signed)
Inpatient Rehabilitation  I met with the patient and family to discuss the recommendation of CIR when pt. medically stable and ready for next venue of care.  Daughter states pt. will have 24 hour support and family would like to pursue CIR once she has undergone her treatments. If for some reason pt. Is unable to come to [facility], family requests [facility] as a backup.  I have updated [employer], RNCM and [employer], CSW.   I will follow up on Monday .     [place] Admissions Coordinator Cell [phone] Office [phone]

## 2016-01-06 NOTE — Progress Notes (Signed)
[facility]    Date of Admission:  12/29/2015   Total days of antibiotics 6        Day 4 nafcillin          ID: Cassandra [name] is a 82 y.o. female with MSSA endocarditis c/b septic CNS emboli, and right foot osteomyelitis Principal Problem:   Sepsis (HCC) Active Problems:   Hypertension   Hyperlipidemia   Gastroenteritis   Glaucoma   Staphylococcus aureus bacteremia   Diminished pulses in lower extremity   Emesis   Cerebral embolism with cerebral infarction   Bacterial endocarditis    Subjective: Afebrile. More alert per family, recognized all family members. Exhausted from sitting up in chair working with pt  Interval hx: repeat blood cx 01/05/16 growing GPCs, blood work still shows increasing leukocytosis of 25. Underwent cta of neck  Medications:  .  stroke: mapping our early stages of recovery book   Does not apply Once  . [START ON 01/07/2016] amLODipine  10 mg Oral Daily  . antiseptic oral rinse  7 mL Mouth Rinse BID  . aspirin  81 mg Oral Daily  . brinzolamide  1 drop Both Eyes TID   And  . brimonidine  1 drop Both Eyes TID  . calcium-vitamin D  1 tablet Oral Daily  . famotidine  20 mg Oral Daily  . feeding supplement (ENSURE ENLIVE)  237 mL Oral BID BM  . heparin  5,000 Units Subcutaneous 3 times per day  . latanoprost  1 drop Both Eyes QHS  . metoCLOPramide (REGLAN) injection  10 mg Intravenous 4 times per day  . nafcillin IV  2 g Intravenous Q4H  . nystatin  5 mL Oral QID  . pantoprazole (PROTONIX) IV  40 mg Intravenous Q12H  . sertraline  100 mg Oral Daily  . simvastatin  10 mg Oral QHS    Objective: Vital signs in last 24 hours: Temp:  [97.5 F (36.4 C)-98.3 F (36.8 C)] 98.3 F (36.8 C) (02/03 1412) Pulse Rate:  [84-113] 113 (02/03 1412) Resp:  [16-20] 20 (02/03 1412) BP: (147-188)/(70-89) 147/70 mmHg (02/03 1412) SpO2:  [92 %-95 %] 92 % (02/03 1412) Physical Exam  Constitutional:  sleeping. appears well-developed and well-nourished. No distress.  HENT: [place]/AT, PERRLA, no scleral icterus Cardiovascular: Normal rate, regular rhythm and normal heart sounds. Soft murmur heard at bilateral upper sternal borders Pulmonary/Chest: Effort normal and breath sounds normal. No respiratory distress.  has no wheezes.  Abdominal: Soft. Bowel sounds are normal.  exhibits no distension. There is no tenderness.  Skin: right great toe amputation, mild erythema to base, with dried crusted scab.   Lab Results  Recent Labs  01/04/16 0542 01/06/16 0542  WBC 25.2*  --   HGB 12.7  --   HCT 37.3  --   NA 136 143  K 3.8 3.4*  CL 106 108  CO2 21* 23  BUN 47* 45*  CREATININE 1.05* 1.03*   Lab Results  Component Value Date   ESRSEDRATE 75* 01/03/2016   Lab Results  Component Value Date   CRP 24.0* 01/03/2016    Microbiology: 2/2 blood cx 1 of 2 gpc 1/31 wound cx mssa 1/30 blood cx ngtd Studies/Results: Ct Angio Head W/cm &/or Wo Cm  01/06/2016  CLINICAL DATA:  Recent posterior fossa and left frontal lobe infarcts. Dull headache. Pain behind the right ear. EXAM: CT ANGIOGRAPHY HEAD AND NECK TECHNIQUE: Multidetector CT imaging of the head and neck  was performed using the standard protocol during bolus administration of intravenous contrast. Multiplanar CT image reconstructions and MIPs were obtained to evaluate the vascular anatomy. Carotid stenosis measurements (when applicable) are obtained utilizing NASCET criteria, using the distal internal carotid diameter as the denominator. CONTRAST:  80mL OMNIPAQUE IOHEXOL 350 MG/ML SOLN COMPARISON:  MRI brain 01/04/2016. FINDINGS: CT HEAD Brain: Moderate periventricular and subcortical white matter hypo attenuation is again noted. The recent punctate infarcts are not evident by CT. The ventricles are of normal size. No significant extraaxial fluid collection is present. Calvarium and skull base: Within normal limits Paranasal sinuses: Clear  Orbits: Bilateral lens replacements are present. The globes and orbits are otherwise intact. CTA NECK Aortic arch: A 3 vessel arch configuration is present. Atherosclerotic changes are present in the aortic arch. There is no significant stenosis. Right carotid system: There is mild tortuosity of the cervical right ICA. The right carotid bifurcation is normal. There is proximal tortuosity of the cervical right ICA. There is some irregularity and beading of the mid cervical right ICA without a significant stenosis. Left carotid system: The left common carotid artery is mildly tortuous. There is no significant focal stenosis. There is no significant stenosis at the bifurcation. There is some irregularity and beading of the mid cervical left ICA without significant focal stenosis. Vertebral arteries:The vertebral arteries both originate from the subclavian arteries. Proximal tortuosity is present without significant stenosis. There is no focal stenosis of the vertebral arteries in the neck. Skeleton: Degenerative endplate change and osseous spurring is evident bilaterally at C6-7. There is slight degenerative anterolisthesis at C5-6. No focal lytic or blastic lesions are present. Other neck: No focal mucosal or submucosal lesions are present. The salivary glands are within normal limits. No significant adenopathy is present. There is some pleural thickening posteriorly in both lower lobes. Minimal atelectasis is present. Lungs are otherwise clear. The mediastinum is within normal limits. CTA HEAD Anterior circulation: Atherosclerotic changes are noted within the cavernous internal carotid arteries bilaterally. There is a 50% supraclinoid stenosis bilaterally relative to the terminal ICA. No other significant focal stenosis is present. The right A1 segment is hypoplastic. There is a moderate stenosis of the mid right A1 segment. The left A1 segment is dominant in the anterior communicating artery is patent. The MCA  bifurcations are intact bilaterally. Multiple mild segmental stenoses are present in the MCA and ACA branch vessels bilaterally. Moderate medium and distal small vessel stenoses are present in the MCA a bath branches bilaterally without a significant proximal occlusion. Posterior circulation: The left vertebral artery is slightly dominant to the right. The PICA origins are visualized and normal. The basilar artery is intact. Both posterior cerebral arteries originate from the basilar tip. There is moderate attenuation of distal PCA branch vessels. A high-grade stenosis is present in the proximal inferior right P3 division. Venous sinuses: The dural sinuses are patent. The straight sinus and deep cerebral veins are intact. The cortical veins are within normal limits. Anatomic variants: None Delayed phase: The postcontrast images demonstrate no pathologic enhancement. IMPRESSION: 1. Punctate infarcts involving the left cerebellum and left frontal lobe are not visualized by CT. 2. No new or acute cortical infarct. 3. Moderate periventricular and subcortical white matter disease is again seen. 4. Atherosclerotic changes at the aortic arch without significant stenosis. 5. Approximately 50% stenosis within the supraclinoid internal carotid artery bilaterally. 6. Moderate medium and small vessel disease in both the anterior and posterior circulation. 7. Near occlusive stenosis in the inferior  right P3 division. 8. Mild spondylosis of the cervical spine as described Electronically Signed   By: Marin Roberts M.D.   On: 01/06/2016 11:57   Ct Angio Neck W/cm &/or Wo/cm  01/06/2016  CLINICAL DATA:  Recent posterior fossa and left frontal lobe infarcts. Dull headache. Pain behind the right ear. EXAM: CT ANGIOGRAPHY HEAD AND NECK TECHNIQUE: Multidetector CT imaging of the head and neck was performed using the standard protocol during bolus administration of intravenous contrast. Multiplanar CT image reconstructions and  MIPs were obtained to evaluate the vascular anatomy. Carotid stenosis measurements (when applicable) are obtained utilizing NASCET criteria, using the distal internal carotid diameter as the denominator. CONTRAST:  80mL OMNIPAQUE IOHEXOL 350 MG/ML SOLN COMPARISON:  MRI brain 01/04/2016. FINDINGS: CT HEAD Brain: Moderate periventricular and subcortical white matter hypo attenuation is again noted. The recent punctate infarcts are not evident by CT. The ventricles are of normal size. No significant extraaxial fluid collection is present. Calvarium and skull base: Within normal limits Paranasal sinuses: Clear Orbits: Bilateral lens replacements are present. The globes and orbits are otherwise intact. CTA NECK Aortic arch: A 3 vessel arch configuration is present. Atherosclerotic changes are present in the aortic arch. There is no significant stenosis. Right carotid system: There is mild tortuosity of the cervical right ICA. The right carotid bifurcation is normal. There is proximal tortuosity of the cervical right ICA. There is some irregularity and beading of the mid cervical right ICA without a significant stenosis. Left carotid system: The left common carotid artery is mildly tortuous. There is no significant focal stenosis. There is no significant stenosis at the bifurcation. There is some irregularity and beading of the mid cervical left ICA without significant focal stenosis. Vertebral arteries:The vertebral arteries both originate from the subclavian arteries. Proximal tortuosity is present without significant stenosis. There is no focal stenosis of the vertebral arteries in the neck. Skeleton: Degenerative endplate change and osseous spurring is evident bilaterally at C6-7. There is slight degenerative anterolisthesis at C5-6. No focal lytic or blastic lesions are present. Other neck: No focal mucosal or submucosal lesions are present. The salivary glands are within normal limits. No significant adenopathy is  present. There is some pleural thickening posteriorly in both lower lobes. Minimal atelectasis is present. Lungs are otherwise clear. The mediastinum is within normal limits. CTA HEAD Anterior circulation: Atherosclerotic changes are noted within the cavernous internal carotid arteries bilaterally. There is a 50% supraclinoid stenosis bilaterally relative to the terminal ICA. No other significant focal stenosis is present. The right A1 segment is hypoplastic. There is a moderate stenosis of the mid right A1 segment. The left A1 segment is dominant in the anterior communicating artery is patent. The MCA bifurcations are intact bilaterally. Multiple mild segmental stenoses are present in the MCA and ACA branch vessels bilaterally. Moderate medium and distal small vessel stenoses are present in the MCA a bath branches bilaterally without a significant proximal occlusion. Posterior circulation: The left vertebral artery is slightly dominant to the right. The PICA origins are visualized and normal. The basilar artery is intact. Both posterior cerebral arteries originate from the basilar tip. There is moderate attenuation of distal PCA branch vessels. A high-grade stenosis is present in the proximal inferior right P3 division. Venous sinuses: The dural sinuses are patent. The straight sinus and deep cerebral veins are intact. The cortical veins are within normal limits. Anatomic variants: None Delayed phase: The postcontrast images demonstrate no pathologic enhancement. IMPRESSION: 1. Punctate infarcts involving the left  cerebellum and left frontal lobe are not visualized by CT. 2. No new or acute cortical infarct. 3. Moderate periventricular and subcortical white matter disease is again seen. 4. Atherosclerotic changes at the aortic arch without significant stenosis. 5. Approximately 50% stenosis within the supraclinoid internal carotid artery bilaterally. 6. Moderate medium and small vessel disease in both the anterior  and posterior circulation. 7. Near occlusive stenosis in the inferior right P3 division. 8. Mild spondylosis of the cervical spine as described Electronically Signed   By: Marin Roberts M.D.   On: 01/06/2016 11:57   Dg Chest Port 1 View  01/06/2016  CLINICAL DATA:  Chest pain EXAM: PORTABLE CHEST 1 VIEW COMPARISON:  01/01/2016 FINDINGS: Cardiac shadow is within normal limits. Elevation of the right hemidiaphragm is again noted. Mild rotation to the left is noted accentuating the mediastinal markings. No focal infiltrate is seen. No acute bony abnormality seen. IMPRESSION: No acute abnormality noted. Electronically Signed   By: Alcide Clever M.D.   On: 01/06/2016 09:02     Assessment/Plan: mssa endocarditis with cns emboli and right foot osteo = still has ongoing bacteremia noted on 2/2 blood cx. Will repeat blood cx. Continue on nafcillin for now. Patient has large vegetation  2.3 cm (L) x 0.5 cm (W), strand-like, loosely organized, highly mobile vegetation on the atrial aspect of the posterior leaflet.P3 (medial) scallop. Appreciate CT surgery evaluation  Right foot osteo = will touch base with podiatry vs ortho if further debridement is needed.  Dr Zenaida Niece dam to provide further recs  Cassandra [name], [facility] Cell: [phone] Pager: [phone]  01/06/2016, 5:00 PM

## 2016-01-06 NOTE — Progress Notes (Signed)
STROKE TEAM PROGRESS NOTE   HISTORY OF PRESENT ILLNESS Cassandra [name] is an 82 y.o. female with past medical history of hypertension, arthritis, hyperlipidemia, left leg chronic ulcer, presents with complaints of generalized weakness, right flank pain, nausea, vomiting, diarrhea, over last 48 hours, patient reports she's been feeling ill over last 48 hours, poor appetite, generalized weakness, fever and feeling feverish, had vomiting started yesterday, as well diarrhea, soft stool. Admitted to hospital for dehydration at [facility]. Patient later became febrile and was found to have MSSA bacteremia. At this time, source of bacteremia is uncertain. Pt remains on IV abx and ID continues to follow. Patient recently noted to be more confused and with R sided weakness. MRI brain notable for multiple punctate lacunar infarcts worrisome for septic emboli. Patietn was transferred to [facility] hospital 12/29/2015 for further evaluation of bacterial endocarditis and Septic emboli. Currently on ASA, Nafcillin. Awaiting Echo and TEE. Per daughter at bedside, she has improved over night. Date last known well is unable to be determined. Patient was not considered for TPA secondary to no LSN and likely septic in source.    SUBJECTIVE (INTERVAL HISTORY) Her granddaughter and great grandson are at the bedside.  Overall she feels her condition is gradually improving. She states pt does not have fever this am. Generalized weakness getting better.    OBJECTIVE Temp:  [97.5 F (36.4 C)-98.2 F (36.8 C)] 97.6 F (36.4 C) (02/03 0847) Pulse Rate:  [84-107] 88 (02/03 0847) Cardiac Rhythm:  [-] Normal sinus rhythm (02/03 0702) Resp:  [14-21] 16 (02/03 0847) BP: (156-203)/(73-144) 172/79 mmHg (02/03 0847) SpO2:  [92 %-98 %] 95 % (02/03 0847)  CBC:   Recent Labs Lab 01/03/16 0525 01/04/16 0542  WBC 20.0* 25.2*  HGB 12.0 12.7  HCT 34.6* 37.3  MCV 88.7 88.8  PLT 179 204    Basic Metabolic Panel:   Recent Labs Lab  01/04/16 0542 01/06/16 0542  NA 136 143  K 3.8 3.4*  CL 106 108  CO2 21* 23  GLUCOSE 129* 132*  BUN 47* 45*  CREATININE 1.05* 1.03*  CALCIUM 8.7* 8.3*    Lipid Panel: No results found for: CHOL, TRIG, HDL, CHOLHDL, VLDL, LDLCALC HgbA1c:  Lab Results  Component Value Date   HGBA1C 5.6 09/19/2014   Urine Drug Screen: No results found for: LABOPIA, COCAINSCRNUR, LABBENZ, AMPHETMU, THCU, LABBARB    IMAGING I have personally reviewed the radiological images below and agree with the radiology interpretations.  Mr Lodema Pilot Contrast  01/04/2016   1. 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 Left MCA, bilateral PCA, and SCA territories are affected. Therefore, consider recent embolic event from the heart or proximal aorta (less likely posterior circulation only thromboembolism). 2. No associated hemorrhage or mass effect. 3. Otherwise no significant change in the MRI appearance of the brain since 2013.   2D Echocardiogram 01/01/2016  - Left ventricle: The cavity size was normal. Systolic function wasvigorous. The estimated ejection fraction was in the range of 65%to 70%. Wall motion was normal; there were no regional wallmotion abnormalities. Doppler parameters are consistent withabnormal left ventricular relaxation (grade 1 diastolicdysfunction). There was no evidence of elevated ventricularfilling pressure by Doppler parameters. - Aortic valve: Trileaflet; normal thickness leaflets. There was noregurgitation. - Aortic root: The aortic root was normal in size. - Mitral valve: Structurally normal valve. There was mildregurgitation. - Left atrium: The atrium was mildly dilated. - Right ventricle: Systolic function was normal. - Tricuspid valve: There  was mild regurgitation. - Pulmonic valve: There was no regurgitation. - Pulmonary arteries: Systolic pressure was moderately increased. PA peak pressure: 48 mm Hg (S). - Inferior vena cava:  The vessel was normal in size. - Pericardium, extracardiac: There was no pericardial effusion. - Impressions:  - No obvious vegetation was identified. If suspicion high considera TEE fo further evaluation.  TEE  01/05/2016 Very large and hypermobile mitral valve vegetation. Moderate  MR. Moderate TR. Normal LV function. Otherwise normal study.  Ct Angio Neck and head W/cm &/or Wo/cm  01/06/2016  IMPRESSION: 1. Punctate infarcts involving the left cerebellum and left frontal lobe are not visualized by CT. 2. No new or acute cortical infarct. 3. Moderate periventricular and subcortical white matter disease is again seen. 4. Atherosclerotic changes at the aortic arch without significant stenosis. 5. Approximately 50% stenosis within the supraclinoid internal carotid artery bilaterally. 6. Moderate medium and small vessel disease in both the anterior and posterior circulation. 7. Near occlusive stenosis in the inferior right P3 division. 8. Mild spondylosis of the cervical spine as described Electronically Signed   By: Marin Roberts M.D.   On: 01/06/2016 11:57   Dg Chest Port 1 View  01/06/2016  IMPRESSION: No acute abnormality noted.    Physical exam  Temp:  [97.5 F (36.4 C)-98.3 F (36.8 C)] 98.3 F (36.8 C) (02/03 1412) Pulse Rate:  [84-113] 113 (02/03 1412) Resp:  [16-20] 20 (02/03 1412) BP: (147-188)/(70-89) 147/70 mmHg (02/03 1412) SpO2:  [92 %-95 %] 92 % (02/03 1412)  General - Well nourished, well developed, mild lethargy.  Ophthalmologic - Fundi not visualized due to noncooperation.  Cardiovascular - Regular rate and rhythm.  Mental Status -  Awake alert but mildly lethargic, but orientated to time, place, and person. Language including expression, naming, repetition, comprehension was assessed and found intact. Fund of Knowledge was assessed and was intact.  Cranial Nerves II - XII - II - Visual field intact OU. III, IV, VI - Extraocular movements intact. V - Facial  sensation intact bilaterally. VII - Facial movement intact bilaterally. VIII - Hearing & vestibular intact bilaterally. X - Palate elevates symmetrically. XI - Chin turning & shoulder shrug intact bilaterally. XII - Tongue protrusion intact.  Motor Strength - The patient's strength was normal in all extremities and pronator drift was absent.  Bulk was normal and fasciculations were absent.   Motor Tone - Muscle tone was assessed at the neck and appendages and was normal.  Reflexes - The patient's reflexes were 1+ in all extremities and she had no pathological reflexes.  Sensory - Light touch, temperature/pinprick were assessed and were symmetrical.    Coordination - The patient had normal movements in the hands and feet with no ataxia or dysmetria.  Tremor was absent.  Gait and Station - deferred to PT OT due to safety concerns.   ASSESSMENT/PLAN Cassandra [name] is a 82 y.o. female with history of hypertension, arthritis, hyperlipidemia, left leg chronic ulcer who was admitted to [facility] and found to have MSSA bacteremia. Transferred to [facility] for further workup. MRI showed scattered punctate infarcts concerning for septic embolli. She did not receive IV t-PA due to unknown LKW and likely septic source.   Stroke:   Punctate scattered small infarcts in anterior and posterior circulation, embolic secondary to MSSA bacteremia with native MV endocarditis and concurrent R foot osteomyelitis  MRI  6 scattered punctate infarcts - L MCA, B PCA, SCA territories  CTA head and neck did not  show any mycotic aneurysms   2D Echo  No vegetation seen, EF 65-70%  TEE MV large hypermobile vegetation  LDL 71  HgbA1c pending  Heparin 5000 units sq tid for VTE prophylaxis Diet regular Room service appropriate?: Yes; Fluid consistency:: Thin  aspirin 81 mg daily prior to admission, now on aspirin 300 mg suppository daily. Ok to decrease to 81 from the stroke stadnpoint. Order written.  Ongoing  aggressive stroke risk factor management  Therapy recommendations:  CIR  Disposition:  pending   Endocarditis  ID on board, continue Abx  Septic emboli in CNS, but small size of infarcts.  CTA no mycotic aneurysms  CTA neck no large vessel stenosis  Blood culture negative 01/02/16 but positive again on 01/05/16 cultures  We recommend to pursue CT surgery at least 48 hours blood culture negative. OK from stroke standpoint to use heparin due to small size of stroke and no mycotic aneurysms seen on CTA, but recommend to avoid hypotension peri-operatively.   No antiplatelet recommended for septic emboli. However, pt has ASA 81 as home meds for PFO, OK to continue.  Hypertension  Slightly elevated, 172/79 this am  Hyperlipidemia  Home meds:  zocor 10  LDL 71, goal < 70  Resumed zocor when able  Continue statin at discharge  Other Stroke Risk Factors  Advanced age  PFO, asymptomatic. On low dose aspirin 81 at home, will continue  Other Active Problems  Depression   Leukocytosis secondary to bacteremia  Chronic L foot wound  N/V/D/abd pain  Elevated troponin  Dehydration  Hypokalemia  Weakness    Hospital day # 5  Marvel Plan, MD PhD Stroke Neurology 01/06/2016 4:45 PM   To contact Stroke Continuity provider, please refer to [email or web address]. After hours, contact General Neurology

## 2016-01-06 NOTE — Progress Notes (Signed)
Called daughter Lurena Joiner this morning to update her about patient's chest pain.  Explained to her that we gave some pain medications, and gi medications and will reassess her pain level after medications began to work.  Granddaughter Nehemiah Settle is on her way and will be here shortly

## 2016-01-06 NOTE — Progress Notes (Addendum)
[facility] PROGRESS NOTE  Cassandra [name]:[id number] DOB: 11-12-1933 DOA: 12/29/2015 PCP: Colette Ribas, MD  Summary:   82 year old female who presented to the hospital with complaints of nausea, vomiting and abdominal pain. She was found to have a possible viral gastroenteritis and was treated supportively. During her hospital stay, she developed significant fevers. Blood cultures were sent and returned positive for MSSA bacteremia. He is not entirely clear. It may be related to the chronic wound on her foot. MRI of the foot could not rule out underlying osteoarthritis versus abscess. Podiatry has been consulted. Currently she is on intravenous antibiotics. Anticipate that she'll be in the hospital several more days. Eventual plan will be to discharge to skilled nursing facility.  Assessment/Plan: 1. Sepsis. Present on admission. Evidenced by blood cultures from 12/31/2015 growing  MSSA, white count of 25,200, having a temp of 101.7 on admission. Case was discussed with Dr. Ilsa Iha of infectious disease who will consult. She is currently on Nafcillin 2 g IV every 4 hours. With MRI of brain showing multiple scattered infarcts there was concern for septic emboli. She underwent a transesophageal echocardiogram on 01/05/2016 which revealed large hypermobile mitral valve vegetation. Source of bacteremia not entirely clear at this time, questionable source of foot ulcer. Repeat blood cultures sent on 1/30 showing no growth to date. CT surgery consulted for recommendations on surgical intervention. Neurology reporting that patient would be clear for surgery if CTA negative for mycotic aneurysms.  2. Septic emboli- MRI performed 01/04/2016 showed Multiple (approximately 6) scattered punctate acute lacunar infarcts in the brain. Left MCA, bilateral PCA, and SCA territories are affected. This was further workup with transesophageal echocardiogram that revealed mitral valve vegetation. She is on IV  antibiotic therapy with nafcillin. CT surgery consulted to determine if she is a candidate for surgical intervention. From a neurologic standpoint she seems stable.  3. Chronic left foot wound . MRI of foot shows possible underlying osteomyelitis, cannot rule out underlying abscess. May be related to bacteremia. Podiatry consulted, she was evaluated by Dr.McKinney.  4. Nausea/Vomiting/diarrhea/abd pain. Pet patient granddaughter this is improved as patient has been stating less frequently she is in pain. Previous Abd/pelvis CT negative for acute findings. Continue symptomatic management and IVF.  Still having some CVA tenderness on palpation 5. Chest Pain. Patient having an episode of CP this morning, which resolved with arrival of family. CXR showed no acute abnormality, troponin 0.08. Will continue cycling troponins. Does not have clinical evidence of CHF.  6. Dehydration, improving with IVF. Will decrease IV fluid rate to 75 mL/hour 7. Hypokalemia, potassium 3.8 on 01/04/2016 after replacement. 8. Weakness, likely related to # 1, occupational therapy recommending inpatient rehabilitation, also placed. 9. HTN, BP elevated- ? Relation to emboli and perfusion deficits seen on MRI. Remains hypertensive, will increase Norvasc from 5 mg PO q daily to 10 mg PO q daily.  10. HLD, continue statin   Code Status: Full DVT prophylaxis: Heparin Family Communication: I spoke with her daughter and granddaughter who were present at bedside Disposition Plan: Plan to CT surgery evaluation.    Consultants:  PT - SNF  Infectious Disease  Podiatry  Neurology  Cardiology  Procedures:  ECHO Study Conclusions- Left ventricle: The cavity size was normal. Systolic function was vigorous. The estimated ejection fraction was in the range of 65% to 70%. Wall motion was normal; there were no regional wall motion abnormalities. Doppler parameters are consistent with abnormal left ventricular  relaxation (grade 1 diastolic dysfunction).  There was no evidence of elevated ventricular filling pressure by Doppler parameters. - Aortic valve: Trileaflet; normal thickness leaflets. There was no regurgitation. - Aortic root: The aortic root was normal in size. - Mitral valve: Structurally normal valve. There was mild regurgitation. - Left atrium: The atrium was mildly dilated. - Right ventricle: Systolic function was normal. - Tricuspid valve: There was mild regurgitation. - Pulmonic valve: There was no regurgitation. - Pulmonary arteries: Systolic pressure was moderately increased. PA peak pressure: 48 mm Hg (S). - Inferior vena cava: The vessel was normal in size. - Pericardium, extracardiac: There was no pericardial effusion.  Antibiotics:  Vancomycin 1/29>>1/31  Cefazolin 1/29>> 2/1  Nafcillin 2/1>>  HPI/Subjective: Patient is awake and alert today, family members at bedside. She presently denies CP. States CP resolved this morning after arrival of family/   Objective: Filed Vitals:   01/06/16 0649 01/06/16 0847  BP: 172/79 172/79  Pulse: 97 88  Temp:  97.6 F (36.4 C)  Resp:  16   No intake or output data in the 24 hours ending 01/06/16 1354 Filed Weights   12/29/15 0759 12/29/15 1957  Weight: 80.74 kg (178 lb) 81.2 kg (179 lb 0.2 oz)    Exam: General: NAD. She is awake and alert.  Cardiovascular: Regular rate and rhythm. I/VI systolic murmur heard best at aortic and pulmonic area  Respiratory: CTAB. No wheezes, rales, or rhonchi.   Abdomen: soft, non tender, no distention, bowel sounds normal Musculoskeletal: No edema b/l. S/p left greater toe amputated. Dressing intact over left great toe and forefoot.    Data Reviewed: Basic Metabolic Panel:  Recent Labs Lab 01/01/16 1142 01/02/16 0534 01/03/16 0525 01/04/16 0542 01/06/16 0542  NA 134* 136 133* 136 143  K 3.5 2.7* 3.7 3.8 3.4*  CL 104 104 106 106 108  CO2 20* 22 20* 21* 23  GLUCOSE  114* 114* 126* 129* 132*  BUN 24* 30* 37* 47* 45*  CREATININE 0.84 0.99 0.91 1.05* 1.03*  CALCIUM 8.6* 8.5* 8.6* 8.7* 8.3*   Liver Function Tests:  Recent Labs Lab 12/31/15 0546  AST 24  ALT 20  ALKPHOS 78  BILITOT 1.0  PROT 6.0*  ALBUMIN 2.7*   No results for input(s): LIPASE, AMYLASE in the last 168 hours. CBC:  Recent Labs Lab 12/31/15 0546 01/02/16 0534 01/03/16 0525 01/04/16 0542  WBC 8.8 13.9* 20.0* 25.2*  HGB 11.4* 11.9* 12.0 12.7  HCT 34.1* 34.9* 34.6* 37.3  MCV 91.4 89.7 88.7 88.8  PLT 124* 159 179 204   Cardiac Enzymes:  Recent Labs Lab 12/30/15 1836 12/31/15 0059 01/06/16 0734  TROPONINI 0.04* 0.03 0.08*     Recent Results (from the past 240 hour(s))  C difficile quick scan w PCR reflex     Status: None   Collection Time: 12/30/15 12:28 AM  Result Value Ref Range Status   C Diff antigen NEGATIVE NEGATIVE Final   C Diff toxin NEGATIVE NEGATIVE Final   C Diff interpretation Negative for toxigenic C. difficile  Final    Comment: NEGATIVE  Culture, blood (routine x 2)     Status: None   Collection Time: 12/31/15 12:16 PM  Result Value Ref Range Status   Specimen Description BLOOD RIGHT ARM  Final   Special Requests BOTTLES DRAWN AEROBIC AND ANAEROBIC 6CC  Final   Culture  Setup Time   Final    GRAM POSITIVE COCCI IN CLUSTERS IN BOTH AEROBIC AND ANAEROBIC BOTTLES CRITICAL RESULT CALLED TO, READ BACK BY AND  VERIFIED WITH: Eileen Stanford. AT 0035 ON 01/01/2016 BY AGUNDIZ,E.    Culture   Final    STAPHYLOCOCCUS AUREUS Performed at [facility]    Report Status 01/03/2016 FINAL  Final   Organism ID, Bacteria STAPHYLOCOCCUS AUREUS  Final      Susceptibility   Staphylococcus aureus - MIC*    CIPROFLOXACIN <=0.5 SENSITIVE Sensitive     ERYTHROMYCIN <=0.25 SENSITIVE Sensitive     GENTAMICIN <=0.5 SENSITIVE Sensitive     OXACILLIN <=0.25 SENSITIVE Sensitive     TETRACYCLINE <=1 SENSITIVE Sensitive     VANCOMYCIN <=0.5 SENSITIVE Sensitive      TRIMETH/SULFA <=10 SENSITIVE Sensitive     CLINDAMYCIN <=0.25 SENSITIVE Sensitive     RIFAMPIN <=0.5 SENSITIVE Sensitive     Inducible Clindamycin NEGATIVE Sensitive     * STAPHYLOCOCCUS AUREUS  Culture, blood (routine x 2)     Status: None   Collection Time: 12/31/15 12:33 PM  Result Value Ref Range Status   Specimen Description BLOOD RIGHT ARM  Final   Special Requests BOTTLES DRAWN AEROBIC AND ANAEROBIC 6CC  Final   Culture  Setup Time   Final    GRAM POSITIVE COCCI IN CLUSTERS IN BOTH AEROBIC AND ANAEROBIC BOTTLES CRITICAL RESULT CALLED TO, READ BACK BY AND VERIFIED WITH: LAVINDER,J. AT 1191 ON 01/01/2016 BY AGUNDIZ,E.    Culture   Final    STAPHYLOCOCCUS AUREUS SUSCEPTIBILITIES PERFORMED ON PREVIOUS CULTURE WITHIN THE LAST 5 DAYS. Performed at [facility]    Report Status 01/03/2016 FINAL  Final  Culture, blood (Routine X 2) w Reflex to ID Panel     Status: None (Preliminary result)   Collection Time: 01/02/16  2:11 PM  Result Value Ref Range Status   Specimen Description BLOOD RIGHT ANTECUBITAL  Final   Special Requests   Final    BOTTLES DRAWN AEROBIC AND ANAEROBIC 6CC EACH  IMMUNE:COMPROMISED   Culture NO GROWTH 4 DAYS  Final   Report Status PENDING  Incomplete  Culture, blood (Routine X 2) w Reflex to ID Panel     Status: None (Preliminary result)   Collection Time: 01/02/16  2:13 PM  Result Value Ref Range Status   Specimen Description BLOOD RIGHT HAND  Final   Special Requests   Final    BOTTLES DRAWN AEROBIC AND ANAEROBIC 6CC  IMMUNE:COMPROMISED   Culture NO GROWTH 4 DAYS  Final   Report Status PENDING  Incomplete  Wound culture     Status: None   Collection Time: 01/03/16  6:43 PM  Result Value Ref Range Status   Specimen Description WOUND LEFT FOOT  Final   Special Requests NONE  Final   Gram Stain   Final    NO WBC SEEN NO SQUAMOUS EPITHELIAL CELLS SEEN NO ORGANISMS SEEN Performed at [facility]    Culture   Final    MODERATE  STAPHYLOCOCCUS AUREUS Note: RIFAMPIN AND GENTAMICIN SHOULD NOT BE USED AS SINGLE DRUGS FOR TREATMENT OF STAPH INFECTIONS. Performed at [facility]    Report Status 01/06/2016 FINAL  Final   Organism ID, Bacteria STAPHYLOCOCCUS AUREUS  Final      Susceptibility   Staphylococcus aureus - MIC*    CLINDAMYCIN <=0.25 SENSITIVE Sensitive     ERYTHROMYCIN <=0.25 SENSITIVE Sensitive     GENTAMICIN <=0.5 SENSITIVE Sensitive     LEVOFLOXACIN <=0.12 SENSITIVE Sensitive     OXACILLIN <=0.25 SENSITIVE Sensitive     RIFAMPIN <=0.5 SENSITIVE Sensitive  TRIMETH/SULFA <=10 SENSITIVE Sensitive     VANCOMYCIN 1 SENSITIVE Sensitive     TETRACYCLINE <=1 SENSITIVE Sensitive     MOXIFLOXACIN <=0.25 SENSITIVE Sensitive     * MODERATE STAPHYLOCOCCUS AUREUS     Studies: Mr Lodema Pilot Contrast  2016-01-18  CLINICAL DATA:  82 year old female with altered mental status, not responding. Initial encounter. EXAM: MRI HEAD WITHOUT AND WITH CONTRAST TECHNIQUE: Multiplanar, multiecho pulse sequences of the brain and surrounding structures were obtained without and with intravenous contrast. CONTRAST:  16mL MULTIHANCE GADOBENATE DIMEGLUMINE 529 MG/ML IV SOLN COMPARISON:  Head CT without contrast 06/14/2014. Brain MRI, MRA a 03/13/2012. FINDINGS: About half a dozen punctate foci of restricted diffusion are demonstrated scattered in the bilateral hemispheres and superior cerebellum. One of these areas is in the medial right occipital lobe PCA territory where there is suggestion of mild underlying cortical encephalomalacia (series 102, image 19). Left MCA, bilateral PCA, and superior cerebellar artery territories are affected. No acute intracranial hemorrhage identified. No midline shift, mass effect, or evidence of intracranial mass lesion. Major intracranial vascular flow voids are stable. Minimal to mild generalized cerebral volume loss since 2013. No ventriculomegaly, extra-axial collection or acute intracranial  hemorrhage. Cervicomedullary junction and pituitary are within normal limits. Negative visualized cervical spine. Patchy and scattered bilateral cerebral white matter T2 and FLAIR hyperintensity is stable. No abnormal enhancement identified. Visible internal auditory structures appear normal. Mild chronic right mastoid effusion is unchanged. Nasopharynx remains normal. Trace paranasal sinus mucosal thickening. Chronic postoperative changes to both globes. Negative scalp soft tissues. IMPRESSION: 1. 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 Left MCA, bilateral PCA, and SCA territories are affected. Therefore, consider recent embolic event from the heart or proximal aorta (less likely posterior circulation only thromboembolism). 2. No associated hemorrhage or mass effect. 3. Otherwise no significant change in the MRI appearance of the brain since 2013. Electronically Signed   By: Odessa Fleming M.D.   On: 2016-01-18 09:49   Mr Foot Left Wo Contrast  01/03/2016  CLINICAL DATA:  Bacteremia. Open wounds on the left foot with history of amputation. EXAM: MRI OF THE LEFT FOREFOOT WITHOUT CONTRAST TECHNIQUE: Multiplanar, multisequence MR imaging was performed. No intravenous contrast was administered. COMPARISON:  01/02/2016; 09/15/2014 FINDINGS: The patient refused IV contrast. Despite efforts by the technologist and patient, severe motion artifact is present on today's exam and could not be eliminated. This reduces exam sensitivity and specificity. First digit amputation at the MTP joint, with a approximately 1.5 by 0.8 by 0.8 cm fluid collection just plantar to the head of the first metatarsal as shown on images 27-32 of series 10, and surrounding abnormal inflammatory effacement of the adipose tissues. Abscess not excluded. Low signal intensity focus on image 33 series 9 possibly a tiny microscopic metallic particle or tiny focus of gas along the amputation margin. As on conventional  radiographs, we demonstrate erosive findings in the head of the second and third metatarsals as well as in the bases of the proximal phalanges of the second and third toes. There is some low-level increased T2 signal along the third metatarsal head as on image 27 series 10 Low-level edema tracks along the plantar musculature of the foot. There is some mild subcutaneous edema dorsally in the foot. Degenerative findings along the Lisfranc joint. No Lisfranc joint malalignment identified. IMPRESSION: 1. Erosive findings at the second and third metatarsophalangeal joints are confirmed. This could represent infection, gout, or rheumatoid arthropathy. Particularly in the head  of the third metatarsal, osteomyelitis is of greater suspicion. Today's images are severely degraded by motion artifact, and the patient refused IV contrast. 2. Fluid collection plantar to the head of the first metatarsal -abscess not excluded given the inflammatory effacement of the surrounding fatty subcutaneous tissues. Electronically Signed   By: Gaylyn Rong M.D.   On: 01/03/2016 08:41    Scheduled Meds: .  stroke: mapping our early stages of recovery book   Does not apply Once  . amLODipine  5 mg Oral Daily  . antiseptic oral rinse  7 mL Mouth Rinse BID  . aspirin  81 mg Oral Daily  . brinzolamide  1 drop Both Eyes TID   And  . brimonidine  1 drop Both Eyes TID  . calcium-vitamin D  1 tablet Oral Daily  . famotidine  20 mg Oral Daily  . heparin  5,000 Units Subcutaneous 3 times per day  . latanoprost  1 drop Both Eyes QHS  . metoCLOPramide (REGLAN) injection  10 mg Intravenous 4 times per day  . nafcillin IV  2 g Intravenous Q4H  . nystatin  5 mL Oral QID  . pantoprazole (PROTONIX) IV  40 mg Intravenous Q12H  . sertraline  100 mg Oral Daily   Continuous Infusions:    Principal Problem:   Sepsis (HCC) Active Problems:   Hypertension   Hyperlipidemia   Gastroenteritis   Glaucoma   Staphylococcus aureus  bacteremia   Diminished pulses in lower extremity   Emesis   Cerebral embolism with cerebral infarction   Time spent: 35 minutes   Mahala Menghini MD  Resident Physician [facility] 01/06/2016, 1:54 PM  LOS: 5 days

## 2016-01-06 NOTE — Progress Notes (Signed)
Initial Nutrition Assessment  DOCUMENTATION CODES:   Not applicable  INTERVENTION:  Provide Ensure Enlive po BID, each supplement provides 350 kcal and 20 grams of protein.  Encourage adequate PO intake.   NUTRITION DIAGNOSIS:   Increased nutrient needs related to acute illness as evidenced by estimated needs.  GOAL:   Patient will meet greater than or equal to 90% of their needs  MONITOR:   PO intake, Supplement acceptance, Labs, I & O's, Weight trends, Skin  REASON FOR ASSESSMENT:   Malnutrition Screening Tool    ASSESSMENT:   82 y.o. female with history of hypertension, arthritis, hyperlipidemia, left leg chronic ulcer who was admitted to [facility] and found to have MSSA bacteremia. Transferred to [facility] for further workup. MRI showed scattered punctate infarcts concerning for septic embolli.   Pt was unavailable during time of visit as pt was with therapy. RD was able to however talk to the daughter present at bedside. She reports pt has been eating well PTA with consumption of at least 3 meals a day with snack in between. Daughter reports current intake has been improving. Since admission, recorded po intake has been 20-25%. No percent meal intake recorded for today however. RD to order Ensure to aid in caloric and protein needs. Daughter reports she will encouraged pt to eat her food at meals. Weight has been stable.   Unable to complete Nutrition-Focused physical exam at this time.   Labs and medications reviewed.    Diet Order:  Diet regular Room service appropriate?: Yes; Fluid consistency:: Thin  Skin:  Wound (see comment) (Wound on L foot)  Last BM:  1/30  Height:   Ht Readings from Last 1 Encounters:  01/04/16 5' 9.5" (1.765 m)    Weight:   Wt Readings from Last 1 Encounters:  12/29/15 179 lb 0.2 oz (81.2 kg)    Ideal Body Weight:  67.04 kg  BMI:  Body mass index is 26.07 kg/(m^2).  Estimated Nutritional Needs:   Kcal:  1850-2050  Protein:  85-95  grams  Fluid:  1.8 - 2 L/day  EDUCATION NEEDS:   No education needs identified at this time  Roslyn Smiling, MS, RD, LDN Pager # [phone] After hours/ weekend pager # [phone]

## 2016-01-06 NOTE — NC FL2 (Signed)
MEDICAID FL2 LEVEL OF CARE SCREENING TOOL     IDENTIFICATION  Patient Name: Cassandra [name] Birthdate: 10-10-1933 Sex: female Admission Date (Current Location): 12/29/2015  [facility] and [place] Number:  Producer, television/film/video and Address:  [facility], [address] [place], [place] [address]      Provider Number: [id number]  Attending Physician Name and Address:  Jeralyn Bennett, MD  Relative Name and Phone Number:       Current Level of Care: Hospital Recommended Level of Care: Skilled Nursing Facility Prior Approval Number:    Date Approved/Denied:   PASRR Number:    Discharge Plan: SNF    Current Diagnoses: Patient Active Problem List   Diagnosis Date Noted  . Sepsis (HCC) 01/05/2016  . Bacteremia   . Diminished pulses in lower extremity   . Emesis   . Staphylococcus aureus bacteremia 01/01/2016  . Gastroenteritis 12/29/2015  . Glaucoma 12/29/2015  . Normocytic anemia 09/17/2014  . Septic arthritis of interphalangeal joint of toe of left foot (HCC) 09/16/2014  . Cellulitis and abscess of toe of left foot 09/16/2014  . Osteomyelitis of toe of left foot (HCC) 09/16/2014  . Hypertension   . Hyperlipidemia   . PFO (patent foramen ovale)   . H/O echocardiogram   . Heart palpitations     Orientation RESPIRATION BLADDER Height & Weight     Self  Normal Incontinent Weight: 179 lb 0.2 oz (81.2 kg) Height:  5' 9.5" (176.5 cm)  BEHAVIORAL SYMPTOMS/MOOD NEUROLOGICAL BOWEL NUTRITION STATUS      Continent  (Please see DC summary)  AMBULATORY STATUS COMMUNICATION OF NEEDS Skin   Extensive Assist Verbally Other (Comment) (Wound on foot)                       Personal Care Assistance Level of Assistance  Bathing, Feeding, Dressing Bathing Assistance: Maximum assistance Feeding assistance: Independent Dressing Assistance: Limited assistance     Functional Limitations Info  Sight Sight Info: Impaired        SPECIAL  CARE FACTORS FREQUENCY  PT (By licensed PT), OT (By licensed OT)     PT Frequency: 5 OT Frequency: min 2x/week            Contractures      Additional Factors Info  Code Status, Allergies Code Status Info: Full Allergies Info: Codeine, Hydrocodone           Current Medications (01/06/2016):  This is the current hospital active medication list Current Facility-Administered Medications  Medication Dose Route Frequency Provider Last Rate Last Dose  .  stroke: mapping our early stages of recovery book   Does not apply Once Jerald Kief, MD      . acetaminophen (TYLENOL) tablet 650 mg  650 mg Oral Q6H PRN Starleen Arms, MD   650 mg at 01/02/16 1956   Or  . acetaminophen (TYLENOL) suppository 650 mg  650 mg Rectal Q6H PRN Starleen Arms, MD      . albuterol (PROVENTIL) (2.5 MG/3ML) 0.083% nebulizer solution 2.5 mg  2.5 mg Nebulization Q6H PRN Erick Blinks, MD   2.5 mg at 01/03/16 1211  . amLODipine (NORVASC) tablet 5 mg  5 mg Oral Daily Jeralyn Bennett, MD   5 mg at 01/05/16 2106  . antiseptic oral rinse (CPC / CETYLPYRIDINIUM CHLORIDE 0.05%) solution 7 mL  7 mL Mouth Rinse BID Starleen Arms, MD   7 mL at 01/05/16 2106  .  aspirin suppository 300 mg  300 mg Rectal Daily Jerald Kief, MD   300 mg at 01/05/16 1051  . brinzolamide (AZOPT) 1 % ophthalmic suspension 1 drop  1 drop Both Eyes TID Jeralyn Bennett, MD   1 drop at 01/05/16 2107   And  . brimonidine (ALPHAGAN) 0.2 % ophthalmic solution 1 drop  1 drop Both Eyes TID Jeralyn Bennett, MD   1 drop at 01/05/16 2107  . calcium-vitamin D (OSCAL WITH D) 500-200 MG-UNIT per tablet 1 tablet  1 tablet Oral Daily Starleen Arms, MD   1 tablet at 01/04/16 1046  . famotidine (PEPCID) IVPB 20 mg premix  20 mg Intravenous Q24H Jerald Kief, MD   20 mg at 01/05/16 1130  . gi cocktail (Maalox,Lidocaine,Donnatal)  30 mL Oral TID PRN Maretta Bees, MD   30 mL at 01/06/16 0754  . haloperidol lactate (HALDOL) injection 2 mg   2 mg Intravenous Q6H PRN Erick Blinks, MD      . heparin injection 5,000 Units  5,000 Units Subcutaneous 3 times per day Starleen Arms, MD   5,000 Units at 01/06/16 0535  . latanoprost (XALATAN) 0.005 % ophthalmic solution 1 drop  1 drop Both Eyes QHS Starleen Arms, MD   1 drop at 01/05/16 2107  . LORazepam (ATIVAN) injection 0.5 mg  0.5 mg Intravenous Q6H PRN Jeralyn Bennett, MD      . magnesium sulfate IVPB 2 g 50 mL  2 g Intravenous Once Shanker Levora Dredge, MD      . metoCLOPramide (REGLAN) injection 10 mg  10 mg Intravenous 4 times per day Maretta Bees, MD   10 mg at 01/06/16 0800  . morphine 2 MG/ML injection 1 mg  1 mg Intravenous Q4H PRN Jerald Kief, MD   1 mg at 01/06/16 0750  . nafcillin 2 g in dextrose 5 % 100 mL IVPB  2 g Intravenous Q4H Erick Blinks, MD   2 g at 01/06/16 0535  . nitroGLYCERIN (NITROSTAT) SL tablet 0.4 mg  0.4 mg Sublingual Q5 min PRN Maretta Bees, MD      . nystatin (MYCOSTATIN) 100000 UNIT/ML suspension 500,000 Units  5 mL Oral QID Jerald Kief, MD   500,000 Units at 01/05/16 2106  . ondansetron (ZOFRAN) 4 MG/5ML solution 4 mg  4 mg Oral Q6H PRN Leana Roe Elgergawy, MD      . pantoprazole (PROTONIX) injection 40 mg  40 mg Intravenous Q12H Maretta Bees, MD   40 mg at 01/06/16 0800  . polyvinyl alcohol (LIQUIFILM TEARS) 1.4 % ophthalmic solution 1 drop  1 drop Both Eyes TID PRN Leana Roe Elgergawy, MD      . potassium chloride SA (K-DUR,KLOR-CON) CR tablet 40 mEq  40 mEq Oral Once Maretta Bees, MD         Discharge Medications: Please see discharge summary for a list of discharge medications.  Relevant Imaging Results:  Relevant Lab Results:   Additional Information SSN [id number]  Mearl Latin, LCSWA

## 2016-01-06 NOTE — Progress Notes (Signed)
[facility]  Patient Status: Active pt with [facility]  [facility] is providing the following services: HHRN and will also provide other [facility] services as ordered at DC.  Pt will require [facility] for home IV ABX.  [facility] team will follow to support transition home when ordered.   If patient discharges after hours, please call [phone].   Cassandra [name] 01/06/2016, 8:00 PM

## 2016-01-06 NOTE — Progress Notes (Signed)
Pt complaining of heaviness and chest pain, patient experiencing SOB with labored breathing.  Paged dr Vanessa Barbara for orders. Vital signs are being taken at this time.

## 2016-01-06 NOTE — Evaluation (Signed)
Speech Language Pathology Evaluation Patient Details Name: Cassandra [name] MRN: [id number] DOB: 1932/12/14 Today's Date: 01/06/2016 Time: 4098-1191 SLP Time Calculation (min) (ACUTE ONLY): 9 min  Problem List:  Patient Active Problem List   Diagnosis Date Noted  . Cerebral embolism with cerebral infarction 01/06/2016  . Sepsis (HCC) 01/05/2016  . Bacteremia   . Diminished pulses in lower extremity   . Emesis   . Staphylococcus aureus bacteremia 01/01/2016  . Gastroenteritis 12/29/2015  . Glaucoma 12/29/2015  . Normocytic anemia 09/17/2014  . Septic arthritis of interphalangeal joint of toe of left foot (HCC) 09/16/2014  . Cellulitis and abscess of toe of left foot 09/16/2014  . Osteomyelitis of toe of left foot (HCC) 09/16/2014  . Hypertension   . Hyperlipidemia   . PFO (patent foramen ovale)   . H/O echocardiogram   . Heart palpitations    Past Medical History:  Past Medical History  Diagnosis Date  . Hypertension   . Arthritis   . Depression   . Hyperlipidemia   . PFO (patent foramen ovale)     small  . H/O echocardiogram 2008    prolapse of post. mitral leaflet, tr MR, tr TR, EF >55%, mild concentric LVH  . H/O cardiovascular stress test 10/13/2007    low risk scan, no changes from previous test  . Heart palpitations     controlled on BB  . Hiatal hernia   . Cellulitis and abscess of toe of left foot 09/16/2014   Past Surgical History:  Past Surgical History  Procedure Laterality Date  . Cholecystectomy    . Cardiac catheterization  10/2002    normal coronary arteries, normal LV function  . Incision and drainage Right 07/21/2014    Procedure: INCISION AND DRAINAGE;  Surgeon: Dallas Schimke, DPM;  Location: [facility];  Service: Podiatry;  Laterality: Right;  . Incision and drainage Left 09/17/2014    Procedure: INCISION AND DRAINAGE 1ST TOE LEFT FOOT;  Surgeon: Dallas Schimke, DPM;  Location: [facility];  Service: Podiatry;  Laterality: Left;  . Bone  biopsy Left 09/17/2014    Procedure: BONE BIOPSY;  Surgeon: Dallas Schimke, DPM;  Location: [facility];  Service: Podiatry;  Laterality: Left;  . Amputation Left 09/20/2014    Procedure: AMPUTATION DIGIT (LEFT GREAT TOE);  Surgeon: Dallas Schimke, DPM;  Location: [facility];  Service: Podiatry;  Laterality: Left;  . Tee without cardioversion N/A 01/05/2016    Procedure: TRANSESOPHAGEAL ECHOCARDIOGRAM (TEE);  Surgeon: Thurmon Fair, MD;  Location: [facility];  Service: Cardiovascular;  Laterality: N/A;   HPI:  82 y.o. female with h/o HTN, arthritis, hyperlipidemia, left leg chronic ulcer. Pt presented with c/o generalized weakness, right flank pain, poor appetite, fever, nausea, vomiting, diarrhea over 48 hour period, indicative of gastroenteritis, dehyrdation. MRI Brain 2/1 multiple (~6) scattered punctuate acute lacunar infarcts in brain, possible recent embolic event from heart or proximal aorta. Left MCA, bilateral PCA and SCA are affected. Vascular congestion with bilateral perihilar and lower lobe opacities, right greater than left, asymmetric edema or infection.   Assessment / Plan / Recommendation Clinical Impression  SLP administered Western Aphasia Battery (WAB) Bedside standardized assessment to evaluate pt's speech and language abilities revealing transcortical sensory aphasia. Confrontational naming intact, however pt presented with fluent speech with moderate word-finding difficulty and semantic paraphasias at conversational level.  Pt's comprehension for basic information functional but demonstrated difficulty following multistep directions and complex information. Repitition intact and written expression/reading comprehension appeared impaired at the sentence  and paragraph level. Pt and granddaughter educated re: results of WAB Bedside and language intervention. SLP will f/u for language tx.    SLP Assessment  Patient needs continued Speech Lanaguage Pathology Services     Follow Up Recommendations  Inpatient Rehab    Frequency and Duration min 2x/week  2 weeks      SLP Evaluation Prior Functioning  Cognitive/Linguistic Baseline: Within functional limits Type of Home: House Available Help at Discharge: Family   Cognition  Overall Cognitive Status: Within Functional Limits for tasks assessed Arousal/Alertness: Awake/alert Orientation Level: Oriented to person;Oriented to place;Disoriented to situation Attention: Selective Sustained Attention: Appears intact Selective Attention: Appears intact Memory: Appears intact Awareness: Appears intact Problem Solving: Appears intact Safety/Judgment: Appears intact    Comprehension  Auditory Comprehension Overall Auditory Comprehension: Impaired Yes/No Questions: Within Functional Limits Commands: Impaired One Step Basic Commands: 75-100% accurate Two Step Basic Commands: 50-74% accurate Multistep Basic Commands: 0-24% accurate Conversation: Simple Interfering Components: Pain EffectiveTechniques: Extra processing time;Repetition Visual Recognition/Discrimination Discrimination: Not tested Reading Comprehension Reading Status: Impaired Word level: Within functional limits Sentence Level: Impaired Paragraph Level: Impaired Functional Environmental (signs, name badge): Not tested Interfering Components: Eye glasses not available    Expression Expression Primary Mode of Expression: Verbal Verbal Expression Overall Verbal Expression: Impaired Initiation: No impairment Level of Generative/Spontaneous Verbalization: Sentence Repetition: No impairment Naming: No impairment Pragmatics: No impairment Effective Techniques: Semantic cues;Open ended questions Non-Verbal Means of Communication: Not applicable Other Verbal Expression Comments: confrontational naming appears intact, however conversation is limited Written Expression Dominant Hand: Right Written Expression: Exceptions to [facility] Dictation  Ability: Phrase Self Formulation Ability: Word   Oral / Motor  Oral Motor/Sensory Function Overall Oral Motor/Sensory Function: Within functional limits Motor Speech Overall Motor Speech: Appears within functional limits for tasks assessed Respiration: Within functional limits Phonation: Normal Resonance: Within functional limits Articulation: Within functional limitis Intelligibility: Intelligible Motor Planning: Witnin functional limits Motor Speech Errors: Not applicable   GO                    Patina Spanier 01/06/2016, 1:51 PM  Lynita Lombard, Student-SLP

## 2016-01-06 NOTE — Clinical Documentation Improvement (Signed)
Infectious Disease Internal Medicine Neurology  Please clarify in progress notes and discharge summary if the following diagnosis, Sepsis was:   Present at the time of admission (POA)  NOT present at the time of admission and it developed during the inpatient stay  Unable to clinically determine whether the condition was present on admission.  Unknown   Supporting Information:  Admitted to [facility] on 12/29/15 complaining of flank pain and admitted with symptoms attributed to viral gastroenteritis, dehydration and weakness. She was later found to have Staph Aureus Bacteremia due to chronic foot ulcer.  Transferred to [facility] to evaluate for bacterial endocarditis and septic emboli.  First mention of Sepsis in chart 01/05/16 by infectious disease.   2/3@1354  progress note: 5. Sepsis. Evidenced by blood cultures from 12/31/2015 growing MSSA, white count of 25,200, having a temp of 101.7 on admission. Case was discussed with Dr. Ilsa Iha of infectious disease who will consult. She is currently on Nafcillin 2 g IV every 4 hours. With MRI of brain showing multiple scattered infarcts there was concern for septic emboli. She underwent a transesophageal echocardiogram on 01/05/2016 which revealed large hypermobile mitral valve vegetation. Source of bacteremia not entirely clear at this time, questionable source of foot ulcer. Repeat blood cultures sent on 1/30 showing no growth to date. CT surgery consulted for recommendations on surgical intervention. Neurology reporting that patient would be clear for surgery if CTA negative for mycotic aneurysms.   Please exercise your independent, professional judgment when responding. A specific answer is not anticipated or expected.   Thank You,  Harless Litten [employer] [place] [phone]

## 2016-01-06 NOTE — Progress Notes (Addendum)
Physical Therapy Treatment Patient Details Name: Cassandra [name] MRN: [id number] DOB: 07-29-1933 Today's Date: 01/06/2016    History of Present Illness 82yo white female who presents to [facility] on 12/29/15 after 2 days of N/V, R flank pain, and generalized weakness. Pt admitted for Gastroenteritis after workup. At baseline, pt reports she is a fully indep community ambulator without AD. PMH: HTN, HLD, and L hallux amputation 1ya. During past few days patient has been increasingly lethargic and somnolent. MRI findings triggered podiatry consult on 1/31, followed by debridement and new NWB status on LLE. MRI also performed secondary to acute encephlaopathy/AMS and revealed multiple acattered punctuate acute lunar infarcts. Pt was transferred from [facility] on 01/04/16.    PT Comments    Pt progressing from bed to chair with repeated attempt and max VCs throughout tx.  Family present and assisted with encouragement to progress mobility.  Pt optimistic but remains weak during transfers in todays session.    Follow Up Recommendations  CIR     Equipment Recommendations  None recommended by PT    Recommendations for Other Services Rehab consult     Precautions / Restrictions Precautions Precautions: Fall Precaution Comments: L foot infection/NWB Restrictions Weight Bearing Restrictions: Yes LLE Weight Bearing: Non weight bearing    Mobility  Bed Mobility Overal bed mobility: +2 for physical assistance;Needs Assistance Bed Mobility: Rolling;Sidelying to Sit;Sit to Supine     Supine to sit: +2 for safety/equipment;Mod assist;HOB elevated;+2 for physical assistance     General bed mobility comments: Assist provided at trunk and LEs. Patient assisting with UEs with supine to sit.   Transfers Overall transfer level: Needs assistance Equipment used: None (Pt reached for rail on recliner chair with R hand) Transfers: Squat Pivot Transfers Sit to Stand: Total assist;Max assist;+2  physical assistance;+2 safety/equipment         General transfer comment: Unable to achieve full standing position with +2 total assist. Required elevation of bed height and squat pivot for successful transfer from bed to chair.  Pt noted with urinary incont during transfer from bed to chair.  REq x 2 attempts.  On first attempt pt in lower height position of bed during transfer.  Pt performed from higher height with success.     Ambulation/Gait Ambulation/Gait assistance:  (remains unable for gt training.  )               Stairs            Wheelchair Mobility    Modified Rankin (Stroke Patients Only)       Balance     Sitting balance-Leahy Scale: Poor       Standing balance-Leahy Scale: Zero                      Cognition Arousal/Alertness: Awake/alert Behavior During Therapy: Flat affect;Anxious Overall Cognitive Status: Within Functional Limits for tasks assessed Area of Impairment: Safety/judgement;Memory;Attention;Problem solving     Memory: Decreased short-term memory Following Commands: Follows one step commands inconsistently Safety/Judgement: Decreased awareness of safety   Problem Solving: Slow processing;Decreased initiation;Difficulty sequencing;Requires verbal cues;Requires tactile cues      Exercises      General Comments        Pertinent Vitals/Pain Pain Assessment: 0-10 Pain Score: 6  Pain Location: R hip and back.  Pain Descriptors / Indicators: Grimacing;Moaning Pain Intervention(s): Monitored during session;Repositioned    Home Living     Available Help at Discharge: Family Type of Home:  House              Prior Function            PT Goals (current goals can now be found in the care plan section) Acute Rehab PT Goals Patient Stated Goal: be able to get back moving again.  Potential to Achieve Goals: Good Progress towards PT goals: Progressing toward goals    Frequency  Min 3X/week    PT Plan  Discharge plan needs to be updated    Co-evaluation             End of Session Equipment Utilized During Treatment: Gait belt Activity Tolerance: Patient limited by fatigue;Other (comment);Patient limited by pain Patient left: in chair;with chair alarm set;with call bell/phone within reach     Time: 1318-1346 PT Time Calculation (min) (ACUTE ONLY): 28 min  Charges:  $Therapeutic Activity: 23-37 mins                    G Codes:      Florestine Avers 01/21/2016, 2:39 PM  Joycelyn Rua, PTA pager [phone]

## 2016-01-06 NOTE — Progress Notes (Signed)
Speech Language Pathology Dysphagia Treatment Patient Details Name: Cassandra [name] MRN: [id number] DOB: 1933/06/03 Today's Date: 01/06/2016 Time: 1290-4753 SLP Time Calculation (min) (ACUTE ONLY): 9 min  Assessment / Plan / Recommendation Clinical Impression    SLP provided skilled observation of POs at bedside to determine diet toleration. No evidence of dysphagia and no s/s of penetration/aspiration observed. Odynophagia appeared resolved from yesterday. Pt and granddaughter educated re: diet recommendation. Recommend regular diet and thin liquids, meds whole in liquid. No continued dysphagia intervention necessary at this time.    Diet Recommendation    Regular diet, thin liquids   SLP Plan All goals met      Swallowing Goals     General Behavior/Cognition: Alert;Cooperative;Pleasant mood Patient Positioning: Upright in bed Oral care provided: N/A HPI: 82 y.o. female with h/o HTN, arthritis, hyperlipidemia, left leg chronic ulcer. Pt presented with c/o generalized weakness, right flank pain, poor appetite, fever, nausea, vomiting, diarrhea over 48 hour period, indicative of gastroenteritis, dehyrdation. MRI Brain 2/1 multiple (~6) scattered punctuate acute lacunar infarcts in brain, possible recent embolic event from heart or proximal aorta. Left MCA, bilateral PCA and SCA are affected. Vascular congestion with bilateral perihilar and lower lobe opacities, right greater than left, asymmetric edema or infection.  Oral Cavity - Oral Hygiene     Dysphagia Treatment Family/Caregiver Educated: granddaughter Treatment Methods: Skilled observation;Patient/caregiver education Patient observed directly with PO's: Yes Type of PO's observed: Regular;Thin liquids Feeding: Able to feed self Liquids provided via: Cup;No straw Oral Phase Signs & Symptoms: Other (comment) (none observed) Pharyngeal Phase Signs & Symptoms: Other (comment) (none observed) Amount of cueing: Independent   GO    Issaac Shipper 01/06/2016, 1:28 PM  Titus Mould, Student-SLP

## 2016-01-06 NOTE — Progress Notes (Signed)
CRITICAL VALUE ALERT  Critical value received:  + aerobic pediatric Gram + cocci and clusters  Date of notification:  01/06/16  Time of notification:  1600  Critical value read back:Yes.    Nurse who received alert:  Tonna Corner, RN  MD notified (1st page):  Vanessa Barbara  Time of first page:  4:13 PM   MD notified (2nd page):  Time of second page:  Responding MD:  Vanessa Barbara  Time MD responded:  4:20pm

## 2016-01-07 DIAGNOSIS — I340 Nonrheumatic mitral (valve) insufficiency: Secondary | ICD-10-CM

## 2016-01-07 DIAGNOSIS — I63139 Cerebral infarction due to embolism of unspecified carotid artery: Secondary | ICD-10-CM

## 2016-01-07 DIAGNOSIS — I330 Acute and subacute infective endocarditis: Secondary | ICD-10-CM

## 2016-01-07 DIAGNOSIS — M86672 Other chronic osteomyelitis, left ankle and foot: Secondary | ICD-10-CM | POA: Insufficient documentation

## 2016-01-07 DIAGNOSIS — I10 Essential (primary) hypertension: Secondary | ICD-10-CM

## 2016-01-07 DIAGNOSIS — Z89412 Acquired absence of left great toe: Secondary | ICD-10-CM

## 2016-01-07 DIAGNOSIS — B9561 Methicillin susceptible Staphylococcus aureus infection as the cause of diseases classified elsewhere: Secondary | ICD-10-CM

## 2016-01-07 DIAGNOSIS — R7881 Bacteremia: Secondary | ICD-10-CM

## 2016-01-07 DIAGNOSIS — E785 Hyperlipidemia, unspecified: Secondary | ICD-10-CM

## 2016-01-07 LAB — BASIC METABOLIC PANEL
ANION GAP: 14 (ref 5–15)
BUN: 31 mg/dL — ABNORMAL HIGH (ref 6–20)
CALCIUM: 8.2 mg/dL — AB (ref 8.9–10.3)
CHLORIDE: 106 mmol/L (ref 101–111)
CO2: 22 mmol/L (ref 22–32)
Creatinine, Ser: 0.96 mg/dL (ref 0.44–1.00)
GFR calc non Af Amer: 54 mL/min — ABNORMAL LOW (ref >60)
Glucose, Bld: 115 mg/dL — ABNORMAL HIGH (ref 65–99)
Potassium: 2.9 mmol/L — ABNORMAL LOW (ref 3.5–5.1)
Sodium: 142 mmol/L (ref 135–145)

## 2016-01-07 LAB — CBC
HEMATOCRIT: 32.5 % — AB (ref 36.0–46.0)
HEMOGLOBIN: 10.8 g/dL — AB (ref 12.0–15.0)
MCH: 30.2 pg (ref 26.0–34.0)
MCHC: 33.2 g/dL (ref 30.0–36.0)
MCV: 90.8 fL (ref 78.0–100.0)
Platelets: 299 10*3/uL (ref 150–400)
RBC: 3.58 MIL/uL — AB (ref 3.87–5.11)
RDW: 14.7 % (ref 11.5–15.5)
WBC: 20.8 10*3/uL — AB (ref 4.0–10.5)

## 2016-01-07 LAB — HEMOGLOBIN A1C
Hgb A1c MFr Bld: 6 % — ABNORMAL HIGH (ref 4.8–5.6)
MEAN PLASMA GLUCOSE: 126 mg/dL

## 2016-01-07 MED ORDER — POTASSIUM CHLORIDE CRYS ER 20 MEQ PO TBCR
40.0000 meq | EXTENDED_RELEASE_TABLET | Freq: Four times a day (QID) | ORAL | Status: AC
Start: 2016-01-07 — End: 2016-01-08
  Administered 2016-01-07 – 2016-01-08 (×3): 40 meq via ORAL
  Filled 2016-01-07 (×3): qty 2

## 2016-01-07 NOTE — Progress Notes (Signed)
Patient ID: Cassandra [name], female   DOB: 03-22-1933, 82 y.o.   MRN: [id number]    Patient ID: Cassandra [name] MRN: [id number] DOB/AGE: Oct 11, 1933 82 y.o.  Admit date: 12/29/2015  Admission Diagnoses:  Principal Problem:   Sepsis (HCC) Active Problems:   Hypertension   Hyperlipidemia   Gastroenteritis   Glaucoma   Staphylococcus aureus bacteremia   Diminished pulses in lower extremity   Emesis   Cerebral embolism with cerebral infarction   Bacterial endocarditis   Chronic osteomyelitis of left foot (HCC)   HPI: Pleasant 82 year old female  pt with a history of left 1st metatarsal amputation over a year ago.  The pt denies pain even with ambulation.  There is a wound on the posterior foot below the amputation. The pt also has a hx of a recent CVA and mitral valve vegetation.   Past Medical History: Past Medical History  Diagnosis Date  . Hypertension   . Arthritis   . Depression   . Hyperlipidemia   . PFO (patent foramen ovale)     small  . H/O echocardiogram 2008    prolapse of post. mitral leaflet, tr MR, tr TR, EF >55%, mild concentric LVH  . H/O cardiovascular stress test 10/13/2007    low risk scan, no changes from previous test  . Heart palpitations     controlled on BB  . Hiatal hernia   . Cellulitis and abscess of toe of left foot 09/16/2014    Surgical History: Past Surgical History  Procedure Laterality Date  . Cholecystectomy    . Cardiac catheterization  10/2002    normal coronary arteries, normal LV function  . Incision and drainage Right 07/21/2014    Procedure: INCISION AND DRAINAGE;  Surgeon: Dallas Schimke, DPM;  Location: [facility];  Service: Podiatry;  Laterality: Right;  . Incision and drainage Left 09/17/2014    Procedure: INCISION AND DRAINAGE 1ST TOE LEFT FOOT;  Surgeon: Dallas Schimke, DPM;  Location: [facility];  Service: Podiatry;  Laterality: Left;  . Bone biopsy Left 09/17/2014    Procedure: BONE BIOPSY;  Surgeon: Dallas Schimke, DPM;  Location: [facility];  Service: Podiatry;  Laterality: Left;  . Amputation Left 09/20/2014    Procedure: AMPUTATION DIGIT (LEFT GREAT TOE);  Surgeon: Dallas Schimke, DPM;  Location: [facility];  Service: Podiatry;  Laterality: Left;  . Tee without cardioversion N/A 01/05/2016    Procedure: TRANSESOPHAGEAL ECHOCARDIOGRAM (TEE);  Surgeon: Thurmon Fair, MD;  Location: [facility] END[facility];  Service: Cardiovascular;  Laterality: N/A;    Family History: Family History  Problem Relation Age of Onset  . Heart disease Mother   . Cancer Father   . Kidney disease Brother   . Hypertension Daughter   . Hyperlipidemia Son     Social History: Social History   Social History  . Marital Status: Married    Spouse Name: N/A  . Number of Children: N/A  . Years of Education: N/A   Occupational History  . Not on file.   Social History Main Topics  . Smoking status: Never Smoker   . Smokeless tobacco: Never Used  . Alcohol Use: No  . Drug Use: No  . Sexual Activity: Not on file   Other Topics Concern  . Not on file   Social History Narrative    Allergies: Codeine and Hydrocodone  Medications: I have reviewed the patient's current medications.  Vital Signs: Patient Vitals for the past 24 hrs:  BP Temp  Temp src Pulse Resp SpO2  01/07/16 1431 (!) 162/77 mmHg 97.7 F (36.5 C) Oral (!) 106 18 94 %  01/07/16 1100 (!) 141/62 mmHg - - 100 17 96 %  01/07/16 1008 (!) 171/77 mmHg 97.5 F (36.4 C) Oral (!) 102 14 94 %  01/07/16 0526 (!) 157/74 mmHg 98.3 F (36.8 C) Oral (!) 102 - 95 %  01/06/16 2134 (!) 159/74 mmHg 98.4 F (36.9 C) Oral 94 (!) 21 95 %    Radiology: Ct Angio Head W/cm &/or Wo Cm  01/06/2016  CLINICAL DATA:  Recent posterior fossa and left frontal lobe infarcts. Dull headache. Pain behind the right ear. EXAM: CT ANGIOGRAPHY HEAD AND NECK TECHNIQUE: Multidetector CT imaging of the head and neck was performed using the standard protocol during bolus  administration of intravenous contrast. Multiplanar CT image reconstructions and MIPs were obtained to evaluate the vascular anatomy. Carotid stenosis measurements (when applicable) are obtained utilizing NASCET criteria, using the distal internal carotid diameter as the denominator. CONTRAST:  80mL OMNIPAQUE IOHEXOL 350 MG/ML SOLN COMPARISON:  MRI brain 01/04/2016. FINDINGS: CT HEAD Brain: Moderate periventricular and subcortical white matter hypo attenuation is again noted. The recent punctate infarcts are not evident by CT. The ventricles are of normal size. No significant extraaxial fluid collection is present. Calvarium and skull base: Within normal limits Paranasal sinuses: Clear Orbits: Bilateral lens replacements are present. The globes and orbits are otherwise intact. CTA NECK Aortic arch: A 3 vessel arch configuration is present. Atherosclerotic changes are present in the aortic arch. There is no significant stenosis. Right carotid system: There is mild tortuosity of the cervical right ICA. The right carotid bifurcation is normal. There is proximal tortuosity of the cervical right ICA. There is some irregularity and beading of the mid cervical right ICA without a significant stenosis. Left carotid system: The left common carotid artery is mildly tortuous. There is no significant focal stenosis. There is no significant stenosis at the bifurcation. There is some irregularity and beading of the mid cervical left ICA without significant focal stenosis. Vertebral arteries:The vertebral arteries both originate from the subclavian arteries. Proximal tortuosity is present without significant stenosis. There is no focal stenosis of the vertebral arteries in the neck. Skeleton: Degenerative endplate change and osseous spurring is evident bilaterally at C6-7. There is slight degenerative anterolisthesis at C5-6. No focal lytic or blastic lesions are present. Other neck: No focal mucosal or submucosal lesions are  present. The salivary glands are within normal limits. No significant adenopathy is present. There is some pleural thickening posteriorly in both lower lobes. Minimal atelectasis is present. Lungs are otherwise clear. The mediastinum is within normal limits. CTA HEAD Anterior circulation: Atherosclerotic changes are noted within the cavernous internal carotid arteries bilaterally. There is a 50% supraclinoid stenosis bilaterally relative to the terminal ICA. No other significant focal stenosis is present. The right A1 segment is hypoplastic. There is a moderate stenosis of the mid right A1 segment. The left A1 segment is dominant in the anterior communicating artery is patent. The MCA bifurcations are intact bilaterally. Multiple mild segmental stenoses are present in the MCA and ACA branch vessels bilaterally. Moderate medium and distal small vessel stenoses are present in the MCA a bath branches bilaterally without a significant proximal occlusion. Posterior circulation: The left vertebral artery is slightly dominant to the right. The PICA origins are visualized and normal. The basilar artery is intact. Both posterior cerebral arteries originate from the basilar tip. There is moderate attenuation of  distal PCA branch vessels. A high-grade stenosis is present in the proximal inferior right P3 division. Venous sinuses: The dural sinuses are patent. The straight sinus and deep cerebral veins are intact. The cortical veins are within normal limits. Anatomic variants: None Delayed phase: The postcontrast images demonstrate no pathologic enhancement. IMPRESSION: 1. Punctate infarcts involving the left cerebellum and left frontal lobe are not visualized by CT. 2. No new or acute cortical infarct. 3. Moderate periventricular and subcortical white matter disease is again seen. 4. Atherosclerotic changes at the aortic arch without significant stenosis. 5. Approximately 50% stenosis within the supraclinoid internal carotid  artery bilaterally. 6. Moderate medium and small vessel disease in both the anterior and posterior circulation. 7. Near occlusive stenosis in the inferior right P3 division. 8. Mild spondylosis of the cervical spine as described Electronically Signed   By: Marin Roberts M.D.   On: 01/06/2016 11:57   Dg Chest 2 View  12/29/2015  CLINICAL DATA:  RIGHT flank pain beginning yesterday, nausea, vomiting, hypertension EXAM: CHEST  2 VIEW COMPARISON:  05/22/2013 FINDINGS: Borderline enlargement of cardiac silhouette Atherosclerotic calcification aorta. Mediastinal contours stable. Minimal pulmonary vascular congestion. Accentuation of interstitial markings in the perihilar regions versus previous exam, cannot exclude minimal edema. No gross pleural effusion or pneumothorax. Bones unremarkable. IMPRESSION: Accentuated interstitial markings versus previous exam particularly in perihilar regions question minimal edema. Electronically Signed   By: Ulyses Southward M.D.   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 Dull headache. Pain behind the right ear. EXAM: CT ANGIOGRAPHY HEAD AND NECK TECHNIQUE: Multidetector CT imaging of the head and neck was performed using the standard protocol during bolus administration of intravenous contrast. Multiplanar CT image reconstructions and MIPs were obtained to evaluate the vascular anatomy. Carotid stenosis measurements (when applicable) are obtained utilizing NASCET criteria, using the distal internal carotid diameter as the denominator. CONTRAST:  80mL OMNIPAQUE IOHEXOL 350 MG/ML SOLN COMPARISON:  MRI brain 01/04/2016. FINDINGS: CT HEAD Brain: Moderate periventricular and subcortical white matter hypo attenuation is again noted. The recent punctate infarcts are not evident by CT. The ventricles are of normal size. No significant extraaxial fluid collection is present. Calvarium and skull  base: Within normal limits Paranasal sinuses: Clear Orbits: Bilateral lens replacements are present. The globes and orbits are otherwise intact. CTA NECK Aortic arch: A 3 vessel arch configuration is present. Atherosclerotic changes are present in the aortic arch. There is no significant stenosis. Right carotid system: There is mild tortuosity of the cervical right ICA. The right carotid bifurcation is normal. There is proximal tortuosity of the cervical right ICA. There is some irregularity and beading of the mid cervical right ICA without a significant stenosis. Left carotid system: The left common carotid artery is mildly tortuous. There is no significant focal stenosis. There is no significant stenosis at the bifurcation. There is some irregularity and beading of the mid cervical left ICA without significant focal stenosis. Vertebral arteries:The vertebral arteries both originate from the subclavian arteries. Proximal tortuosity is present without significant stenosis. There is no focal stenosis of the vertebral arteries in the neck. Skeleton: Degenerative endplate change and osseous spurring is evident bilaterally at C6-7. There is slight degenerative anterolisthesis at C5-6. No focal lytic or blastic lesions are present. Other neck: No focal mucosal or submucosal lesions are present. The salivary glands are within normal limits. No significant adenopathy is present. There is  some pleural thickening posteriorly in both lower lobes. Minimal atelectasis is present. Lungs are otherwise clear. The mediastinum is within normal limits. CTA HEAD Anterior circulation: Atherosclerotic changes are noted within the cavernous internal carotid arteries bilaterally. There is a 50% supraclinoid stenosis bilaterally relative to the terminal ICA. No other significant focal stenosis is present. The right A1 segment is hypoplastic. There is a moderate stenosis of the mid right A1 segment. The left A1 segment is dominant in the  anterior communicating artery is patent. The MCA bifurcations are intact bilaterally. Multiple mild segmental stenoses are present in the MCA and ACA branch vessels bilaterally. Moderate medium and distal small vessel stenoses are present in the MCA a bath branches bilaterally without a significant proximal occlusion. Posterior circulation: The left vertebral artery is slightly dominant to the right. The PICA origins are visualized and normal. The basilar artery is intact. Both posterior cerebral arteries originate from the basilar tip. There is moderate attenuation of distal PCA branch vessels. A high-grade stenosis is present in the proximal inferior right P3 division. Venous sinuses: The dural sinuses are patent. The straight sinus and deep cerebral veins are intact. The cortical veins are within normal limits. Anatomic variants: None Delayed phase: The postcontrast images demonstrate no pathologic enhancement. IMPRESSION: 1. Punctate infarcts involving the left cerebellum and left frontal lobe are not visualized by CT. 2. No new or acute cortical infarct. 3. Moderate periventricular and subcortical white matter disease is again seen. 4. Atherosclerotic changes at the aortic arch without significant stenosis. 5. Approximately 50% stenosis within the supraclinoid internal carotid artery bilaterally. 6. Moderate medium and small vessel disease in both the anterior and posterior circulation. 7. Near occlusive stenosis in the inferior right P3 division. 8. Mild spondylosis of the cervical spine as described Electronically Signed   By: Marin Roberts M.D.   On: 01/06/2016 11:57   Mr Laqueta Jean WJ Contrast  01/04/2016  CLINICAL DATA:  82 year old female with altered mental status, not responding. Initial encounter. EXAM: MRI HEAD WITHOUT AND WITH CONTRAST TECHNIQUE: Multiplanar, multiecho pulse sequences of the brain and surrounding structures were obtained without and with intravenous contrast. CONTRAST:  16mL  MULTIHANCE GADOBENATE DIMEGLUMINE 529 MG/ML IV SOLN COMPARISON:  Head CT without contrast 06/14/2014. Brain MRI, MRA a 03/13/2012. FINDINGS: About half a dozen punctate foci of restricted diffusion are demonstrated scattered in the bilateral hemispheres and superior cerebellum. One of these areas is in the medial right occipital lobe PCA territory where there is suggestion of mild underlying cortical encephalomalacia (series 102, image 19). Left MCA, bilateral PCA, and superior cerebellar artery territories are affected. No acute intracranial hemorrhage identified. No midline shift, mass effect, or evidence of intracranial mass lesion. Major intracranial vascular flow voids are stable. Minimal to mild generalized cerebral volume loss since 2013. No ventriculomegaly, extra-axial collection or acute intracranial hemorrhage. Cervicomedullary junction and pituitary are within normal limits. Negative visualized cervical spine. Patchy and scattered bilateral cerebral white matter T2 and FLAIR hyperintensity is stable. No abnormal enhancement identified. Visible internal auditory structures appear normal. Mild chronic right mastoid effusion is unchanged. Nasopharynx remains normal. Trace paranasal sinus mucosal thickening. Chronic postoperative changes to both globes. Negative scalp soft tissues. IMPRESSION: 1. 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 Left MCA, bilateral PCA, and SCA territories are affected. Therefore, consider recent embolic event from the heart or proximal aorta (less likely posterior circulation only thromboembolism). 2. No associated hemorrhage or mass effect. 3. Otherwise no significant change in  the MRI appearance of the brain since 2013. Electronically Signed   By: Odessa Fleming M.D.   On: 01/04/2016 09:49   Ct Abdomen Pelvis W Contrast  12/29/2015  CLINICAL DATA:  Right flank pain since January 25th. Negative renal stone protocol CT earlier today. Additional  history of hypertension, hiatal hernia and cholecystectomy. EXAM: CT ABDOMEN AND PELVIS WITH CONTRAST TECHNIQUE: Multidetector CT imaging of the abdomen and pelvis was performed using the standard protocol following bolus administration of intravenous contrast. CONTRAST:  OMNIPAQUE IOHEXOL 300 MG/ML  SOLN COMPARISON:  Noncontrast abdomen and pelvis CT dated 12/29/2015. Comparison also made to CT abdomen pelvis dated 05/22/2013. FINDINGS: Lower chest: Mild dependent atelectasis at each lung base. Otherwise unremarkable. Hepatobiliary: Liver appears normal. Patient is status post cholecystectomy with associated mild bile duct ectasia. Pancreas: No mass, inflammatory changes, or other significant abnormality. Spleen: Upper normal in size. Adrenals/Urinary Tract: Adrenal glands are unremarkable. Questionable small stable nodules within each adrenal gland, of benign etiology. Kidneys appear normal without stone or hydronephrosis. No ureteral or bladder calculi identified. Bladder appears normal. Stomach/Bowel: Bowel is normal in caliber. No bowel wall thickening or evidence of bowel wall inflammation. Appendix is normal. Vascular/Lymphatic: Scattered mild atherosclerotic changes of the normal- caliber abdominal aorta. No acute vascular abnormality seen. No enlarged lymph nodes within the abdomen or pelvis. Reproductive: Unremarkable. Small amount of free fluid in the pelvis is likely physiologic in nature. Other: No significant free fluid. No abscess collection. No free intraperitoneal air. Musculoskeletal: Degenerative changes within the lumbar spine, stable in appearance compared to the previous CT of 05/22/2013. No acute- appearing osseous abnormality. 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 Superficial soft tissues are unremarkable. IMPRESSION: 1. No source for right flank pain identified, unless related to the chronic degenerative changes in the lumbar  spine. No evidence of acute intra-abdominal or intrapelvic abnormality. 2. Additional incidental/chronic findings detailed above. Electronically Signed   By: Bary Richard M.D.   On: 12/29/2015 17:52   Mr Foot Left Wo Contrast  01/03/2016  CLINICAL DATA:  Bacteremia. Open wounds on the left foot with history of amputation. EXAM: MRI OF THE LEFT FOREFOOT WITHOUT CONTRAST TECHNIQUE: Multiplanar, multisequence MR imaging was performed. No intravenous contrast was administered. COMPARISON:  01/02/2016; 09/15/2014 FINDINGS: The patient refused IV contrast. Despite efforts by the technologist and patient, severe motion artifact is present on today's exam and could not be eliminated. This reduces exam sensitivity and specificity. First digit amputation at the MTP joint, with a approximately 1.5 by 0.8 by 0.8 cm fluid collection just plantar to the head of the first metatarsal as shown on images 27-32 of series 10, and surrounding abnormal inflammatory effacement of the adipose tissues. Abscess not excluded. Low signal intensity focus on image 33 series 9 possibly a tiny microscopic metallic particle or tiny focus of gas along the amputation margin. As on conventional radiographs, we demonstrate erosive findings in the head of the second and third metatarsals as well as in the bases of the proximal phalanges of the second and third toes. There is some low-level increased T2 signal along the third metatarsal head as on image 27 series 10 Low-level edema tracks along the plantar musculature of the foot. There is some mild subcutaneous edema dorsally in the foot. Degenerative findings along the Lisfranc joint. No Lisfranc joint malalignment identified. IMPRESSION: 1. Erosive findings at the second and third metatarsophalangeal joints are confirmed. This could represent infection, gout, or rheumatoid arthropathy.  Particularly in the head of the third metatarsal, osteomyelitis is of greater suspicion. Today's images are  severely degraded by motion artifact, and the patient refused IV contrast. 2. Fluid collection plantar to the head of the first metatarsal -abscess not excluded given the inflammatory effacement of the surrounding fatty subcutaneous tissues. Electronically Signed   By: Gaylyn Rong M.D.   On: 01/03/2016 08:41   US Arterial Seg Multiple  12/30/2015  CLINICAL DATA:  82 year old female with left foot cellulitis and constant bilateral lower extremity leg pain for the last 5 days EXAM: NONINVASIVE PHYSIOLOGIC VASCULAR STUDY OF BILATERAL LOWER EXTREMITIES TECHNIQUE: Evaluation of both lower extremities was performed at rest, including calculation of ankle-brachial indices, multiple segmental pressure evaluation, segmental Doppler and segmental pulse volume recording. COMPARISON:  None. FINDINGS: Right ABI:  1.2 Left ABI:  1.2 Right Lower Extremity: Normal triphasic arterial waveforms at the ankle. Limited utility of segmental blood pressures secondary to poor compressibility of the vessels. Significantly decreased arterial waveforms in the right second through fifth digits. Left Lower Extremity: Normal triphasic arterial waveforms at the ankle. Limited utility of segmental blood pressures secondary to poor compressibility of the vessels. Decreased amplitude of the arterial waveforms in the second through fifth digits. IMPRESSION: 1. Normal bilateral resting ankle-brachial indices and arterial waveforms at the ankles. No evidence of clinically significant large vessel peripheral arterial disease. 2. Abnormal digital waveforms bilaterally consistent with small vessel disease. Signed, Sterling Big, MD [facility] Electronically Signed   By: Malachy Moan M.D.   On: 12/30/2015 15:38   Dg Chest Port 1 View  01/06/2016  CLINICAL DATA:  Chest pain EXAM: PORTABLE CHEST 1 VIEW COMPARISON:  01/01/2016 FINDINGS: Cardiac shadow is within normal limits.  Elevation of the right hemidiaphragm is again noted. Mild rotation to the left is noted accentuating the mediastinal markings. No focal infiltrate is seen. No acute bony abnormality seen. IMPRESSION: No acute abnormality noted. Electronically Signed   By: Alcide Clever M.D.   On: 01/06/2016 09:02   Dg Chest Port 1 View  01/01/2016  CLINICAL DATA:  Shortness of breath, confusion EXAM: PORTABLE CHEST 1 VIEW COMPARISON:  12/29/2015 FINDINGS: Heart is borderline in size. Vascular congestion. Bilateral perihilar and lower lobe opacities are noted, right greater than left. This could represent asymmetric edema or infection. No visible effusions or acute bony abnormality. IMPRESSION: Borderline heart size. Vascular congestion with bilateral perihilar and lower lobe opacities, right greater than left, asymmetric edema or infection. Electronically Signed   By: Charlett Nose M.D.   On: 01/01/2016 11:45   Dg Foot Complete Left  01/02/2016  CLINICAL DATA:  Open wound of the left foot.  Great toe amputated. EXAM: LEFT FOOT - COMPLETE 3+ VIEW COMPARISON:  09/29/2014 FINDINGS: The great toe has been amputated. The patient has developed what is most likely a chronic dislocation at the second metatarsal phalangeal joint with an erosion of the metatarsal head. There is bone resorption of the distorted distal third metatarsal with deformity consistent with a prior fracture. There is also a new deformity of the distal fourth metatarsal consistent with an old fracture. The bones of the second through fifth toes appear normal. There is no ulceration on the plantar aspect of the ball of the foot. No significant abnormality of the hindfoot or midfoot. IMPRESSION: Chronic changes at the second and third metatarsal phalangeal joints. I doubt that either one of these represents osteomyelitis. Electronically Signed   By: Francene Boyers  M.D.   On: 01/02/2016 13:59   Ct Renal Stone Study  12/29/2015  CLINICAL DATA:  Right flank pain  starting yesterday. Nausea vomiting. EXAM: CT ABDOMEN AND PELVIS WITHOUT CONTRAST TECHNIQUE: Multidetector CT imaging of the abdomen and pelvis was performed following the standard protocol without IV contrast. COMPARISON:  None. FINDINGS: Lower chest:  Lung bases are clear. Hepatobiliary: Normal liver.  Prior cholecystectomy. Pancreas: Normal. Spleen: Normal. Adrenals/Urinary Tract: Normal adrenal glands. Normal kidneys. Normal bladder. No urolithiasis or obstructive uropathy. Incidental note is made if a right extrarenal pelvis. Stomach/Bowel: No bowel wall thickening or dilatation. No pneumatosis, pneumoperitoneum or portal venous gas. No abdominal or pelvic free fluid. Vascular/Lymphatic: Normal caliber abdominal aorta. Mild abdominal aortic atherosclerosis. No lymphadenopathy. Reproductive: Normal uterus.  No adnexal mass. Other: No fluid collection or hematoma. Fat containing right inguinal hernia. Musculoskeletal: No lytic or sclerotic osseous lesion. Degenerative disc disease and facet arthropathy throughout the lumbar spine most severe at L4-5. IMPRESSION: 1. No urolithiasis or obstructive uropathy. Electronically Signed   By: Elige Ko   On: 12/29/2015 08:57    Labs:  Recent Labs  01/07/16 0909  WBC 20.8*  RBC 3.58*  HCT 32.5*  PLT 299    Recent Labs  01/06/16 0542 01/07/16 0909  NA 143 142  K 3.4* 2.9*  CL 108 106  CO2 23 22  BUN 45* 31*  CREATININE 1.03* 0.96  GLUCOSE 132* 115*  CALCIUM 8.3* 8.2*   No results for input(s): LABPT, INR in the last 72 hours.  Review of Systems: ROS  Physical Exam: Left 1st metatarsal amputation present No tenderness to palpation No drainage No erythema 1 inch wound on posterior foot below amputaion  Assessment and Plan: Treat with Dry dressing changes Dr. Shon Baton will review MRI with Dr. Victorino Dike (foot specialist) No indication for surgical intervension at this time Infectious Disease consult done earlier today   [facility] [phone]  Agree with above Will defer definitive wound management to Dr. Victorino Dike

## 2016-01-07 NOTE — Consult Note (Signed)
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  Now with septic brain emboli  Cassandra [name] MRN: [id number] DOB/AGE: 80-Sep-1934 80 y.o.  Admit date: 12/29/2015 Primary Physician Colette Ribas, MD  Primary Cardiologist Nanetta Batty, MD Requesting Physician: Sheliah Plane, MD  Chief Complaint  Mitral valve endocarditis  HPI: Cassandra [name] is an 40F with hypertension, hyperlipidemia, and PFO here with MSSA endocarditis presumably due to osteomyelitis.  She reports feeling poorly for several weeks.  She has noted generalized fatigue, weakness, nausea, vomiting and fevers.  She was admitted through the ED on 1/26 with concern for gastroenteritis.  Abdominal CT was unremarkable.  She had blood cultures drawn on 1/31 that were positive for MSSA.   She developed confusion and MRI of the brain showed multiple punctate lacunar infarcts concerning for septic emboli.  Transthoracic echo did not reveal any vegetations. However, on TTE she was found to have a 2.3 cm x 0.5 cm vegetation on the P3 segment of the mitral valve.  She has remained on IV antibiotics, but repeat blood culture on 2/3 was 1:2 positive for MSSA.  She also had a culture of the amputation site that grew MSSA.  Cardiothoracic surgery was consulted for consideration of mitral valve replacement.  Dr. Tyrone Sage recommended a multidisciplinary approach to proceeding with her care, so cardiology was consulted.  Cassandra [name] reports that she continues to feel poorly.  She endorses occasional non-exertional chest discomfort that is new this admission.  She denies any shortness of breath, lower extremity edema, orthopnea, PND or palpitations.  Her main complaints are fatigue, right side pain and diarrhea which is causing her buttocks to be raw.   Review of Systems: A 12 point review of systems was obtained and was negative with exceptions as noted in the HPI.  Past  Medical History  Diagnosis Date  . Hypertension   . Arthritis   . Depression   . Hyperlipidemia   . PFO (patent foramen ovale)     small  . H/O echocardiogram 2008    prolapse of post. mitral leaflet, tr MR, tr TR, EF >55%, mild concentric LVH  . H/O cardiovascular stress test 10/13/2007    low risk scan, no changes from previous test  . Heart palpitations     controlled on BB  . Hiatal hernia   . Cellulitis and abscess of toe of left foot 09/16/2014    Medications Prior to Admission  Medication Sig Dispense Refill  . aspirin 81 MG chewable tablet Chew 81 mg by mouth daily.    [name] atenolol (TENORMIN) 100 MG tablet Take 1 tablet by mouth daily.    . Biotin 5000 MCG CAPS Take 10,000 mcg by mouth daily.     . Calcium Carbonate-Vitamin D (CALCIUM + D) 600-200 MG-UNIT TABS Take 1 tablet by mouth daily.    . carboxymethylcellulose (REFRESH PLUS) 0.5 % SOLN Place 1 drop into both eyes 3 (three) times daily as needed (Dry Eyes).    . Cyanocobalamin (VITAMIN B-12 IJ) Inject as directed every 30 (thirty) days.    [name] latanoprost (XALATAN) 0.005 % ophthalmic solution Place 1 drop into both eyes at bedtime.    [name] losartan (COZAAR) 100 MG tablet Take 100 mg by mouth daily.  11  . Multiple Vitamins-Minerals (PRESERVISION AREDS PO) Take 1 tablet by mouth 2 (two) times daily.    . naproxen (NAPROSYN) 500 MG tablet Take 500 mg by mouth daily. For pain    .  ranitidine (ZANTAC) 150 MG tablet Take 150 mg by mouth 2 (two) times daily.    . sertraline (ZOLOFT) 100 MG tablet Take 100 mg by mouth daily.     . silver sulfADIAZINE (SILVADENE) 1 % cream Apply 1 application topically 2 (two) times daily.    [name] SIMBRINZA 1-0.2 % SUSP Place 1 drop into both eyes 3 (three) times daily.    . simvastatin (ZOCOR) 10 MG tablet Take 10 mg by mouth at bedtime.    . traMADol (ULTRAM) 50 MG tablet Take one or two tablets by mouth every 6 hours as needed for pain 240 tablet 5     .  stroke: mapping our early stages of  recovery book   Does not apply Once  . amLODipine  10 mg Oral Daily  . antiseptic oral rinse  7 mL Mouth Rinse BID  . aspirin  81 mg Oral Daily  . brinzolamide  1 drop Both Eyes TID   And  . brimonidine  1 drop Both Eyes TID  . calcium-vitamin D  1 tablet Oral Daily  . famotidine  20 mg Oral Daily  . feeding supplement (ENSURE ENLIVE)  237 mL Oral BID BM  . heparin  5,000 Units Subcutaneous 3 times per day  . latanoprost  1 drop Both Eyes QHS  . metoCLOPramide (REGLAN) injection  10 mg Intravenous 4 times per day  . nafcillin IV  2 g Intravenous Q4H  . nystatin  5 mL Oral QID  . pantoprazole (PROTONIX) IV  40 mg Intravenous Q12H  . potassium chloride  40 mEq Oral Q6H  . sertraline  100 mg Oral Daily  . simvastatin  10 mg Oral QHS    Infusions:    Allergies  Allergen Reactions  . Codeine Shortness Of Breath and Nausea And Vomiting  . Hydrocodone Shortness Of Breath and Nausea And Vomiting    Social History   Social History  . Marital Status: Married    Spouse Name: N/A  . Number of Children: N/A  . Years of Education: N/A   Occupational History  . Not on file.   Social History Main Topics  . Smoking status: Never Smoker   . Smokeless tobacco: Never Used  . Alcohol Use: No  . Drug Use: No  . Sexual Activity: Not on file   Other Topics Concern  . Not on file   Social History Narrative    Family History  Problem Relation Age of Onset  . Heart disease Mother   . Cancer Father   . Kidney disease Brother   . Hypertension Daughter   . Hyperlipidemia Son     PHYSICAL EXAM: Filed Vitals:   01/07/16 1008 01/07/16 1100  BP: 171/77 141/62  Pulse: 102 100  Temp: 97.5 F (36.4 C)   Resp: 14 17     Intake/Output Summary (Last 24 hours) at 01/07/16 1410 Last data filed at 01/07/16 1245  Gross per 24 hour  Intake    940 ml  Output      0 ml  Net    940 ml    General:  Chronically ill-appearing. No respiratory difficulty HEENT: normal Neck: supple. no  JVD. Carotids 2+ bilat; no bruits. No lymphadenopathy or thryomegaly appreciated. Cor: PMI nondisplaced. Regular rate & rhythm. No rubs, gallops or murmurs. Lungs: clear to ausculation bilaterally. Abdomen: soft, nontender, nondistended. No hepatosplenomegaly. No bruits or masses. Hyperactive bowel sounds. Extremities: no cyanosis, clubbing, rash, edema.  R foot dressed. Neuro: alert &  oriented x 3, cranial nerves grossly intact. moves all 4 extremities w/o difficulty. Affect pleasant.  Results for orders placed or performed during the hospital encounter of 12/29/15 (from the past 24 hour(s))  Culture, blood (routine x 2)     Status: None (Preliminary result)   Collection Time: 01/06/16  4:55 PM  Result Value Ref Range   Specimen Description BLOOD LEFT ANTECUBITAL    Special Requests BOTTLES DRAWN AEROBIC AND ANAEROBIC 10CC    Culture NO GROWTH < 24 HOURS    Report Status PENDING   Culture, blood (routine x 2)     Status: None (Preliminary result)   Collection Time: 01/06/16  4:59 PM  Result Value Ref Range   Specimen Description BLOOD BLOOD LEFT HAND    Special Requests BOTTLES DRAWN AEROBIC AND ANAEROBIC 10CC    Culture NO GROWTH < 24 HOURS    Report Status PENDING   Troponin I (q 6hr x 3)     Status: Abnormal   Collection Time: 01/06/16  8:12 PM  Result Value Ref Range   Troponin I 0.10 (H) <0.031 ng/mL  CBC     Status: Abnormal   Collection Time: 01/07/16  9:09 AM  Result Value Ref Range   WBC 20.8 (H) 4.0 - 10.5 K/uL   RBC 3.58 (L) 3.87 - 5.11 MIL/uL   Hemoglobin 10.8 (L) 12.0 - 15.0 g/dL   HCT 16.1 (L) 09.6 - 04.5 %   MCV 90.8 78.0 - 100.0 fL   MCH 30.2 26.0 - 34.0 pg   MCHC 33.2 30.0 - 36.0 g/dL   RDW 40.9 81.1 - 91.4 %   Platelets 299 150 - 400 K/uL  Basic metabolic panel     Status: Abnormal   Collection Time: 01/07/16  9:09 AM  Result Value Ref Range   Sodium 142 135 - 145 mmol/L   Potassium 2.9 (L) 3.5 - 5.1 mmol/L   Chloride 106 101 - 111 mmol/L   CO2 22 22 -  32 mmol/L   Glucose, Bld 115 (H) 65 - 99 mg/dL   BUN 31 (H) 6 - 20 mg/dL   Creatinine, Ser 7.82 0.44 - 1.00 mg/dL   Calcium 8.2 (L) 8.9 - 10.3 mg/dL   GFR calc non Af Amer 54 (L) >60 mL/min   GFR calc Af Amer >60 >60 mL/min   Anion gap 14 5 - 15   Ct Angio Head W/cm &/or Wo Cm  01/06/2016  CLINICAL DATA:  Recent posterior fossa and left frontal lobe infarcts. Dull headache. Pain behind the right ear. EXAM: CT ANGIOGRAPHY HEAD AND NECK TECHNIQUE: Multidetector CT imaging of the head and neck was performed using the standard protocol during bolus administration of intravenous contrast. Multiplanar CT image reconstructions and MIPs were obtained to evaluate the vascular anatomy. Carotid stenosis measurements (when applicable) are obtained utilizing NASCET criteria, using the distal internal carotid diameter as the denominator. CONTRAST:  80mL OMNIPAQUE IOHEXOL 350 MG/ML SOLN COMPARISON:  MRI brain 01/04/2016. FINDINGS: CT HEAD Brain: Moderate periventricular and subcortical white matter hypo attenuation is again noted. The recent punctate infarcts are not evident by CT. The ventricles are of normal size. No significant extraaxial fluid collection is present. Calvarium and skull base: Within normal limits Paranasal sinuses: Clear Orbits: Bilateral lens replacements are present. The globes and orbits are otherwise intact. CTA NECK Aortic arch: A 3 vessel arch configuration is present. Atherosclerotic changes are present in the aortic arch. There is no significant stenosis. Right carotid system: There is  mild tortuosity of the cervical right ICA. The right carotid bifurcation is normal. There is proximal tortuosity of the cervical right ICA. There is some irregularity and beading of the mid cervical right ICA without a significant stenosis. Left carotid system: The left common carotid artery is mildly tortuous. There is no significant focal stenosis. There is no significant stenosis at the bifurcation. There is  some irregularity and beading of the mid cervical left ICA without significant focal stenosis. Vertebral arteries:The vertebral arteries both originate from the subclavian arteries. Proximal tortuosity is present without significant stenosis. There is no focal stenosis of the vertebral arteries in the neck. Skeleton: Degenerative endplate change and osseous spurring is evident bilaterally at C6-7. There is slight degenerative anterolisthesis at C5-6. No focal lytic or blastic lesions are present. Other neck: No focal mucosal or submucosal lesions are present. The salivary glands are within normal limits. No significant adenopathy is present. There is some pleural thickening posteriorly in both lower lobes. Minimal atelectasis is present. Lungs are otherwise clear. The mediastinum is within normal limits. CTA HEAD Anterior circulation: Atherosclerotic changes are noted within the cavernous internal carotid arteries bilaterally. There is a 50% supraclinoid stenosis bilaterally relative to the terminal ICA. No other significant focal stenosis is present. The right A1 segment is hypoplastic. There is a moderate stenosis of the mid right A1 segment. The left A1 segment is dominant in the anterior communicating artery is patent. The MCA bifurcations are intact bilaterally. Multiple mild segmental stenoses are present in the MCA and ACA branch vessels bilaterally. Moderate medium and distal small vessel stenoses are present in the MCA a bath branches bilaterally without a significant proximal occlusion. Posterior circulation: The left vertebral artery is slightly dominant to the right. The PICA origins are visualized and normal. The basilar artery is intact. Both posterior cerebral arteries originate from the basilar tip. There is moderate attenuation of distal PCA branch vessels. A high-grade stenosis is present in the proximal inferior right P3 division. Venous sinuses: The dural sinuses are patent. The straight sinus  and deep cerebral veins are intact. The cortical veins are within normal limits. Anatomic variants: None Delayed phase: The postcontrast images demonstrate no pathologic enhancement. IMPRESSION: 1. Punctate infarcts involving the left cerebellum and left frontal lobe are not visualized by CT. 2. No new or acute cortical infarct. 3. Moderate periventricular and subcortical white matter disease is again seen. 4. Atherosclerotic changes at the aortic arch without significant stenosis. 5. Approximately 50% stenosis within the supraclinoid internal carotid artery bilaterally. 6. Moderate medium and small vessel disease in both the anterior and posterior circulation. 7. Near occlusive stenosis in the inferior right P3 division. 8. Mild spondylosis of the cervical spine as described Electronically Signed   By: Marin Roberts M.D.   On: 01/06/2016 11:57   Ct Angio Neck W/cm &/or Wo/cm  01/06/2016  CLINICAL DATA:  Recent posterior fossa and left frontal lobe infarcts. Dull headache. Pain behind the right ear. EXAM: CT ANGIOGRAPHY HEAD AND NECK TECHNIQUE: Multidetector CT imaging of the head and neck was performed using the standard protocol during bolus administration of intravenous contrast. Multiplanar CT image reconstructions and MIPs were obtained to evaluate the vascular anatomy. Carotid stenosis measurements (when applicable) are obtained utilizing NASCET criteria, using the distal internal carotid diameter as the denominator. CONTRAST:  80mL OMNIPAQUE IOHEXOL 350 MG/ML SOLN COMPARISON:  MRI brain 01/04/2016. FINDINGS: CT HEAD Brain: Moderate periventricular and subcortical white matter hypo attenuation is again noted. The recent punctate infarcts  are not evident by CT. The ventricles are of normal size. No significant extraaxial fluid collection is present. Calvarium and skull base: Within normal limits Paranasal sinuses: Clear Orbits: Bilateral lens replacements are present. The globes and orbits are  otherwise intact. CTA NECK Aortic arch: A 3 vessel arch configuration is present. Atherosclerotic changes are present in the aortic arch. There is no significant stenosis. Right carotid system: There is mild tortuosity of the cervical right ICA. The right carotid bifurcation is normal. There is proximal tortuosity of the cervical right ICA. There is some irregularity and beading of the mid cervical right ICA without a significant stenosis. Left carotid system: The left common carotid artery is mildly tortuous. There is no significant focal stenosis. There is no significant stenosis at the bifurcation. There is some irregularity and beading of the mid cervical left ICA without significant focal stenosis. Vertebral arteries:The vertebral arteries both originate from the subclavian arteries. Proximal tortuosity is present without significant stenosis. There is no focal stenosis of the vertebral arteries in the neck. Skeleton: Degenerative endplate change and osseous spurring is evident bilaterally at C6-7. There is slight degenerative anterolisthesis at C5-6. No focal lytic or blastic lesions are present. Other neck: No focal mucosal or submucosal lesions are present. The salivary glands are within normal limits. No significant adenopathy is present. There is some pleural thickening posteriorly in both lower lobes. Minimal atelectasis is present. Lungs are otherwise clear. The mediastinum is within normal limits. CTA HEAD Anterior circulation: Atherosclerotic changes are noted within the cavernous internal carotid arteries bilaterally. There is a 50% supraclinoid stenosis bilaterally relative to the terminal ICA. No other significant focal stenosis is present. The right A1 segment is hypoplastic. There is a moderate stenosis of the mid right A1 segment. The left A1 segment is dominant in the anterior communicating artery is patent. The MCA bifurcations are intact bilaterally. Multiple mild segmental stenoses are  present in the MCA and ACA branch vessels bilaterally. Moderate medium and distal small vessel stenoses are present in the MCA a bath branches bilaterally without a significant proximal occlusion. Posterior circulation: The left vertebral artery is slightly dominant to the right. The PICA origins are visualized and normal. The basilar artery is intact. Both posterior cerebral arteries originate from the basilar tip. There is moderate attenuation of distal PCA branch vessels. A high-grade stenosis is present in the proximal inferior right P3 division. Venous sinuses: The dural sinuses are patent. The straight sinus and deep cerebral veins are intact. The cortical veins are within normal limits. Anatomic variants: None Delayed phase: The postcontrast images demonstrate no pathologic enhancement. IMPRESSION: 1. Punctate infarcts involving the left cerebellum and left frontal lobe are not visualized by CT. 2. No new or acute cortical infarct. 3. Moderate periventricular and subcortical white matter disease is again seen. 4. Atherosclerotic changes at the aortic arch without significant stenosis. 5. Approximately 50% stenosis within the supraclinoid internal carotid artery bilaterally. 6. Moderate medium and small vessel disease in both the anterior and posterior circulation. 7. Near occlusive stenosis in the inferior right P3 division. 8. Mild spondylosis of the cervical spine as described Electronically Signed   By: Marin Roberts M.D.   On: 01/06/2016 11:57   Dg Chest Port 1 View  01/06/2016  CLINICAL DATA:  Chest pain EXAM: PORTABLE CHEST 1 VIEW COMPARISON:  01/01/2016 FINDINGS: Cardiac shadow is within normal limits. Elevation of the right hemidiaphragm is again noted. Mild rotation to the left is noted accentuating the mediastinal markings. No  focal infiltrate is seen. No acute bony abnormality seen. IMPRESSION: No acute abnormality noted. Electronically Signed   By: Alcide Clever M.D.   On: 01/06/2016 09:02    TTE 01/01/16: Study Conclusions  - Left ventricle: The cavity size was normal. Systolic function was vigorous. The estimated ejection fraction was in the range of 65% to 70%. Wall motion was normal; there were no regional wall motion abnormalities. Doppler parameters are consistent with abnormal left ventricular relaxation (grade 1 diastolic dysfunction). There was no evidence of elevated ventricular filling pressure by Doppler parameters. - Aortic valve: Trileaflet; normal thickness leaflets. There was no regurgitation. - Aortic root: The aortic root was normal in size. - Mitral valve: Structurally normal valve. There was mild regurgitation. - Left atrium: The atrium was mildly dilated. - Right ventricle: Systolic function was normal. - Tricuspid valve: There was mild regurgitation. - Pulmonic valve: There was no regurgitation. - Pulmonary arteries: Systolic pressure was moderately increased. PA peak pressure: 48 mm Hg (S). - Inferior vena cava: The vessel was normal in size. - Pericardium, extracardiac: There was no pericardial effusion.  Impressions:  - No obvious vegetation was identified. If suspicion high consider a TEE fo further evaluation.  TEE 01/05/16: Study Conclusions  - Left ventricle: The cavity size was normal. Wall thickness was normal. Systolic function was normal. The estimated ejection fraction was in the range of 60% to 65%. Wall motion was normal; there were no regional wall motion abnormalities. - Aortic valve: No evidence of vegetation. There was trivial regurgitation. - Mitral valve: Mild, late systolicprolapse. There was a large, 2.3 cm (L) x 0.5 cm (W), strand-like, loosely organized, highly mobile vegetation on the atrial aspect of the posterior leaflet. P3 (medial) scallop. There was moderate regurgitation directed centrally. - Left atrium: No evidence of thrombus in the atrial cavity or appendage. -  Right atrium: No evidence of thrombus in the atrial cavity or appendage. - Atrial septum: There was an atrial septal aneurysm. - Tricuspid valve: No evidence of vegetation. There was moderate regurgitation directed centrally. - Pulmonic valve: No evidence of vegetation.   ECG:  01/06/16:  Sinus rhythm rate 94 bpm.  Prior inferior infarct.    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  Wound culture on 1/31 also revealed Staph aureus.   She has been afebrile but has a persistent leukocytosis.  Given her lack of heart failure symptoms, she does not have an indication for emergent valve replacement.  However, I think that the likelihood of her clearing a persistent MSSA baceremia with a large, 2.3 x0.5 cm vegetation and poor source control is low.  This is a tricky situation, given that her operative risk is quite high.  STS risk of mortality is 7.7% with a 42% risk of morbidity or mortality.  Agree with Dr. Tyrone Sage that she would need cath prior to surgery, if that path should be chosen.  I would be reluctant to pursue surgery without confirmation from Ortho that her foot has been optimized.  Otherwise, it is likely that she would develop infection of the replaced valve. - Daily EKG to evaluate for PR prolongation and abscess - Consider additional amputation to control the source.  Awaiting recommendations from Orthopedics - Antibiotics per ID - Close monitoring for evidence of heart failure  Signed: Kymberley Raz C. Duke Salvia, MD, [facility]  01/07/2016, 2:10 PM

## 2016-01-07 NOTE — Progress Notes (Signed)
[facility] PROGRESS NOTE  Cassandra [name] ZOX:[id number] DOB: 25-Jun-1933 DOA: 12/29/2015 PCP: Colette Ribas, MD  Summary:   82 year old female who presented to the hospital with complaints of nausea, vomiting and abdominal pain. She was found to have a possible viral gastroenteritis and was treated supportively. During her hospital stay, she developed significant fevers. Blood cultures were sent and returned positive for MSSA bacteremia. He is not entirely clear. It may be related to the chronic wound on her foot. MRI of the foot could not rule out underlying osteoarthritis versus abscess. Podiatry has been consulted. Currently she is on intravenous antibiotics. Anticipate that she'll be in the hospital several more days. Eventual plan will be to discharge to skilled nursing facility.  Assessment/Plan: 1. Sepsis/Endocarditis. Present on admission. Evidenced by blood cultures from 12/31/2015 growing  MSSA, white count of 25,200, having a temp of 101.7 on admission. Case was discussed with Dr. Ilsa Iha of infectious disease who will consult. She is currently on Nafcillin 2 g IV every 4 hours. With MRI of brain showing multiple scattered infarcts there was concern for septic emboli. She underwent a transesophageal echocardiogram on 01/05/2016 which revealed large hypermobile mitral valve vegetation. Source of bacteremia not entirely clear at this time, questionable source of foot ulcer. CT surgery consulted for recommendations on surgical intervention. Neurology reporting that patient would be clear for surgery if CTA negative for mycotic aneurysms. Case was discussed with CT surgery and ID, recommended consulting orthopedic surgery to assess wound at left foot. Repeat blood cultures from 01/06/2016: 1/2 positive for Staph aureus. Repeat blood cultures from 01/06/2016 showing no growth to date.     2. Septic emboli- MRI performed 01/04/2016 showed Multiple (approximately 6) scattered punctate acute  lacunar infarcts in the brain. Left MCA, bilateral PCA, and SCA territories are affected. This was further workup with transesophageal echocardiogram that revealed mitral valve vegetation. She is on IV antibiotic therapy with nafcillin. CT surgery consulted to determine if she is a candidate for surgical intervention. From a neurologic standpoint she seems stable.  3. Chronic left foot wound . MRI of foot shows possible underlying osteomyelitis, cannot rule out underlying abscess. May be related to bacteremia. Podiatry consulted, she was evaluated by Dr.McKinney at [facility]. Wound culture grew MSSA. On 01/07/2016 Case discussed with Dr Shon Baton of Orthopedic Surgery for evaluation of foot and MRI findings. Suspect this to be source of infection.  4. Nausea/Vomiting/diarrhea/abd pain. Pet patient granddaughter this is improved as patient has been stating less frequently she is in pain. Previous Abd/pelvis CT negative for acute findings. Continue symptomatic management and IVF.   5. Chest Pain. Patient having an episode of CP this morning, which resolved with arrival of family. CXR showed no acute abnormality, troponin 0.08. Will continue cycling troponins. Does not have clinical evidence of CHF.  6. Dehydration, IV fluids stopped given concerns for fluid overload. 7. Hypokalemia, potassium 3.8 on 01/04/2016 after replacement. 8. Weakness, likely related to # 1, occupational therapy recommending inpatient rehabilitation, also placed. 9. HTN, BP elevated- ? Relation to emboli and perfusion deficits seen on MRI. Remains hypertensive, will increase Norvasc from 5 mg PO q daily to 10 mg PO q daily.  10. HLD, continue statin   Code Status: Full DVT prophylaxis: Heparin Family Communication: I spoke with her daughter and granddaughter who were present at bedside Disposition Plan: Orthopedic Surgery consulted today to assess foot.     Consultants:  PT - SNF  Infectious  Disease  Podiatry  Neurology  Cardiology  Procedures:  ECHO Study Conclusions- Left ventricle: The cavity size was normal. Systolic function was vigorous. The estimated ejection fraction was in the range of 65% to 70%. Wall motion was normal; there were no regional wall motion abnormalities. Doppler parameters are consistent with abnormal left ventricular relaxation (grade 1 diastolic dysfunction). There was no evidence of elevated ventricular filling pressure by Doppler parameters. - Aortic valve: Trileaflet; normal thickness leaflets. There was no regurgitation. - Aortic root: The aortic root was normal in size. - Mitral valve: Structurally normal valve. There was mild regurgitation. - Left atrium: The atrium was mildly dilated. - Right ventricle: Systolic function was normal. - Tricuspid valve: There was mild regurgitation. - Pulmonic valve: There was no regurgitation. - Pulmonary arteries: Systolic pressure was moderately increased. PA peak pressure: 48 mm Hg (S). - Inferior vena cava: The vessel was normal in size. - Pericardium, extracardiac: There was no pericardial effusion.  Antibiotics:  Vancomycin 1/29>>1/31  Cefazolin 1/29>> 2/1  Nafcillin 2/1>>  HPI/Subjective: Patient is awake and alert today, family members at bedside. She presently denies CP.   Objective: Filed Vitals:   01/07/16 1008 01/07/16 1100  BP: 171/77 141/62  Pulse: 102 100  Temp: 97.5 F (36.4 C)   Resp: 14 17    Intake/Output Summary (Last 24 hours) at 01/07/16 1258 Last data filed at 01/07/16 1245  Gross per 24 hour  Intake   1640 ml  Output      0 ml  Net   1640 ml   Filed Weights   12/29/15 0759 12/29/15 1957  Weight: 80.74 kg (178 lb) 81.2 kg (179 lb 0.2 oz)    Exam: General: NAD. She is awake and alert.  Cardiovascular: Regular rate and rhythm. I/VI systolic murmur heard best at aortic and pulmonic area  Respiratory: CTAB. No wheezes, rales, or  rhonchi.   Abdomen: soft, non tender, no distention, bowel sounds normal Musculoskeletal: No edema b/l. S/p left greater toe amputated. Min erythema over amputation site.     Data Reviewed: Basic Metabolic Panel:  Recent Labs Lab 01/02/16 0534 01/03/16 0525 01/04/16 0542 01/06/16 0542 01/07/16 0909  NA 136 133* 136 143 142  K 2.7* 3.7 3.8 3.4* 2.9*  CL 104 106 106 108 106  CO2 22 20* 21* 23 22  GLUCOSE 114* 126* 129* 132* 115*  BUN 30* 37* 47* 45* 31*  CREATININE 0.99 0.91 1.05* 1.03* 0.96  CALCIUM 8.5* 8.6* 8.7* 8.3* 8.2*   Liver Function Tests: No results for input(s): AST, ALT, ALKPHOS, BILITOT, PROT, ALBUMIN in the last 168 hours. No results for input(s): LIPASE, AMYLASE in the last 168 hours. CBC:  Recent Labs Lab 01/02/16 0534 01/03/16 0525 01/04/16 0542 01/07/16 0909  WBC 13.9* 20.0* 25.2* 20.8*  HGB 11.9* 12.0 12.7 10.8*  HCT 34.9* 34.6* 37.3 32.5*  MCV 89.7 88.7 88.8 90.8  PLT 159 179 204 299   Cardiac Enzymes:  Recent Labs Lab 01/06/16 0734 01/06/16 1351 01/06/16 2012  TROPONINI 0.08* 0.13* 0.10*     Recent Results (from the past 240 hour(s))  C difficile quick scan w PCR reflex     Status: None   Collection Time: 12/30/15 12:28 AM  Result Value Ref Range Status   C Diff antigen NEGATIVE NEGATIVE Final   C Diff toxin NEGATIVE NEGATIVE Final   C Diff interpretation Negative for toxigenic C. difficile  Final    Comment: NEGATIVE  Culture, blood (routine x 2)     Status: None  Collection Time: 12/31/15 12:16 PM  Result Value Ref Range Status   Specimen Description BLOOD RIGHT ARM  Final   Special Requests BOTTLES DRAWN AEROBIC AND ANAEROBIC 6CC  Final   Culture  Setup Time   Final    GRAM POSITIVE COCCI IN CLUSTERS IN BOTH AEROBIC AND ANAEROBIC BOTTLES CRITICAL RESULT CALLED TO, READ BACK BY AND VERIFIED WITH: Carnella Guadalajara, J. AT 0035 ON 01/01/2016 BY AGUNDIZ,E.    Culture   Final    STAPHYLOCOCCUS AUREUS Performed at [facility]     Report Status 01/03/2016 FINAL  Final   Organism ID, Bacteria STAPHYLOCOCCUS AUREUS  Final      Susceptibility   Staphylococcus aureus - MIC*    CIPROFLOXACIN <=0.5 SENSITIVE Sensitive     ERYTHROMYCIN <=0.25 SENSITIVE Sensitive     GENTAMICIN <=0.5 SENSITIVE Sensitive     OXACILLIN <=0.25 SENSITIVE Sensitive     TETRACYCLINE <=1 SENSITIVE Sensitive     VANCOMYCIN <=0.5 SENSITIVE Sensitive     TRIMETH/SULFA <=10 SENSITIVE Sensitive     CLINDAMYCIN <=0.25 SENSITIVE Sensitive     RIFAMPIN <=0.5 SENSITIVE Sensitive     Inducible Clindamycin NEGATIVE Sensitive     * STAPHYLOCOCCUS AUREUS  Culture, blood (routine x 2)     Status: None   Collection Time: 12/31/15 12:33 PM  Result Value Ref Range Status   Specimen Description BLOOD RIGHT ARM  Final   Special Requests BOTTLES DRAWN AEROBIC AND ANAEROBIC 6CC  Final   Culture  Setup Time   Final    GRAM POSITIVE COCCI IN CLUSTERS IN BOTH AEROBIC AND ANAEROBIC BOTTLES CRITICAL RESULT CALLED TO, READ BACK BY AND VERIFIED WITH: LAVINDER,J. AT 1610 ON 01/01/2016 BY AGUNDIZ,E.    Culture   Final    STAPHYLOCOCCUS AUREUS SUSCEPTIBILITIES PERFORMED ON PREVIOUS CULTURE WITHIN THE LAST 5 DAYS. Performed at [facility]    Report Status 01/03/2016 FINAL  Final  Culture, blood (Routine X 2) w Reflex to ID Panel     Status: None (Preliminary result)   Collection Time: 01/02/16  2:11 PM  Result Value Ref Range Status   Specimen Description BLOOD RIGHT ANTECUBITAL  Final   Special Requests   Final    BOTTLES DRAWN AEROBIC AND ANAEROBIC 6CC EACH  IMMUNE:COMPROMISED   Culture NO GROWTH 4 DAYS  Final   Report Status PENDING  Incomplete  Culture, blood (Routine X 2) w Reflex to ID Panel     Status: None (Preliminary result)   Collection Time: 01/02/16  2:13 PM  Result Value Ref Range Status   Specimen Description BLOOD RIGHT HAND  Final   Special Requests   Final    BOTTLES DRAWN AEROBIC AND ANAEROBIC 6CC  IMMUNE:COMPROMISED   Culture NO  GROWTH 4 DAYS  Final   Report Status PENDING  Incomplete  Wound culture     Status: None   Collection Time: 01/03/16  6:43 PM  Result Value Ref Range Status   Specimen Description WOUND LEFT FOOT  Final   Special Requests NONE  Final   Gram Stain   Final    NO WBC SEEN NO SQUAMOUS EPITHELIAL CELLS SEEN NO ORGANISMS SEEN Performed at [facility]    Culture   Final    MODERATE STAPHYLOCOCCUS AUREUS Note: RIFAMPIN AND GENTAMICIN SHOULD NOT BE USED AS SINGLE DRUGS FOR TREATMENT OF STAPH INFECTIONS. Performed at [facility]    Report Status 01/06/2016 FINAL  Final   Organism ID, Bacteria STAPHYLOCOCCUS AUREUS  Final  Susceptibility   Staphylococcus aureus - MIC*    CLINDAMYCIN <=0.25 SENSITIVE Sensitive     ERYTHROMYCIN <=0.25 SENSITIVE Sensitive     GENTAMICIN <=0.5 SENSITIVE Sensitive     LEVOFLOXACIN <=0.12 SENSITIVE Sensitive     OXACILLIN <=0.25 SENSITIVE Sensitive     RIFAMPIN <=0.5 SENSITIVE Sensitive     TRIMETH/SULFA <=10 SENSITIVE Sensitive     VANCOMYCIN 1 SENSITIVE Sensitive     TETRACYCLINE <=1 SENSITIVE Sensitive     MOXIFLOXACIN <=0.25 SENSITIVE Sensitive     * MODERATE STAPHYLOCOCCUS AUREUS  Culture, blood (routine x 2)     Status: None (Preliminary result)   Collection Time: 01/05/16  8:06 PM  Result Value Ref Range Status   Specimen Description BLOOD RIGHT ARM  Final   Special Requests IN PEDIATRIC BOTTLE 4CC  Final   Culture  Setup Time   Final    GRAM POSITIVE COCCI IN CLUSTERS AEROBIC BOTTLE ONLY CRITICAL RESULT CALLED TO, READ BACK BY AND VERIFIED WITH: J EDWARDS RN 1556 01/06/16 A BROWNING    Culture STAPHYLOCOCCUS AUREUS  Final   Report Status PENDING  Incomplete  Culture, blood (routine x 2)     Status: None (Preliminary result)   Collection Time: 01/05/16  8:08 PM  Result Value Ref Range Status   Specimen Description BLOOD RIGHT HAND  Final   Special Requests BOTTLES DRAWN AEROBIC ONLY 9CC  Final   Culture NO GROWTH < 24 HOURS   Final   Report Status PENDING  Incomplete     Studies: Mr Lodema Pilot Contrast  01/12/16  CLINICAL DATA:  82 year old female with altered mental status, not responding. Initial encounter. EXAM: MRI HEAD WITHOUT AND WITH CONTRAST TECHNIQUE: Multiplanar, multiecho pulse sequences of the brain and surrounding structures were obtained without and with intravenous contrast. CONTRAST:  16mL MULTIHANCE GADOBENATE DIMEGLUMINE 529 MG/ML IV SOLN COMPARISON:  Head CT without contrast 06/14/2014. Brain MRI, MRA a 03/13/2012. FINDINGS: About half a dozen punctate foci of restricted diffusion are demonstrated scattered in the bilateral hemispheres and superior cerebellum. One of these areas is in the medial right occipital lobe PCA territory where there is suggestion of mild underlying cortical encephalomalacia (series 102, image 19). Left MCA, bilateral PCA, and superior cerebellar artery territories are affected. No acute intracranial hemorrhage identified. No midline shift, mass effect, or evidence of intracranial mass lesion. Major intracranial vascular flow voids are stable. Minimal to mild generalized cerebral volume loss since 2013. No ventriculomegaly, extra-axial collection or acute intracranial hemorrhage. Cervicomedullary junction and pituitary are within normal limits. Negative visualized cervical spine. Patchy and scattered bilateral cerebral white matter T2 and FLAIR hyperintensity is stable. No abnormal enhancement identified. Visible internal auditory structures appear normal. Mild chronic right mastoid effusion is unchanged. Nasopharynx remains normal. Trace paranasal sinus mucosal thickening. Chronic postoperative changes to both globes. Negative scalp soft tissues. IMPRESSION: 1. 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 Left MCA, bilateral PCA, and SCA territories are affected. Therefore, consider recent embolic event from the heart or proximal aorta (less likely  posterior circulation only thromboembolism). 2. No associated hemorrhage or mass effect. 3. Otherwise no significant change in the MRI appearance of the brain since 2013. Electronically Signed   By: Odessa Fleming M.D.   On: 01-12-2016 09:49   Mr Foot Left Wo Contrast  01/03/2016  CLINICAL DATA:  Bacteremia. Open wounds on the left foot with history of amputation. EXAM: MRI OF THE LEFT FOREFOOT WITHOUT CONTRAST TECHNIQUE: Multiplanar, multisequence MR  imaging was performed. No intravenous contrast was administered. COMPARISON:  01/02/2016; 09/15/2014 FINDINGS: The patient refused IV contrast. Despite efforts by the technologist and patient, severe motion artifact is present on today's exam and could not be eliminated. This reduces exam sensitivity and specificity. First digit amputation at the MTP joint, with a approximately 1.5 by 0.8 by 0.8 cm fluid collection just plantar to the head of the first metatarsal as shown on images 27-32 of series 10, and surrounding abnormal inflammatory effacement of the adipose tissues. Abscess not excluded. Low signal intensity focus on image 33 series 9 possibly a tiny microscopic metallic particle or tiny focus of gas along the amputation margin. As on conventional radiographs, we demonstrate erosive findings in the head of the second and third metatarsals as well as in the bases of the proximal phalanges of the second and third toes. There is some low-level increased T2 signal along the third metatarsal head as on image 27 series 10 Low-level edema tracks along the plantar musculature of the foot. There is some mild subcutaneous edema dorsally in the foot. Degenerative findings along the Lisfranc joint. No Lisfranc joint malalignment identified. IMPRESSION: 1. Erosive findings at the second and third metatarsophalangeal joints are confirmed. This could represent infection, gout, or rheumatoid arthropathy. Particularly in the head of the third metatarsal, osteomyelitis is of greater  suspicion. Today's images are severely degraded by motion artifact, and the patient refused IV contrast. 2. Fluid collection plantar to the head of the first metatarsal -abscess not excluded given the inflammatory effacement of the surrounding fatty subcutaneous tissues. Electronically Signed   By: Gaylyn Rong M.D.   On: 01/03/2016 08:41    Scheduled Meds: .  stroke: mapping our early stages of recovery book   Does not apply Once  . amLODipine  10 mg Oral Daily  . antiseptic oral rinse  7 mL Mouth Rinse BID  . aspirin  81 mg Oral Daily  . brinzolamide  1 drop Both Eyes TID   And  . brimonidine  1 drop Both Eyes TID  . calcium-vitamin D  1 tablet Oral Daily  . famotidine  20 mg Oral Daily  . feeding supplement (ENSURE ENLIVE)  237 mL Oral BID BM  . heparin  5,000 Units Subcutaneous 3 times per day  . latanoprost  1 drop Both Eyes QHS  . metoCLOPramide (REGLAN) injection  10 mg Intravenous 4 times per day  . nafcillin IV  2 g Intravenous Q4H  . nystatin  5 mL Oral QID  . pantoprazole (PROTONIX) IV  40 mg Intravenous Q12H  . potassium chloride  40 mEq Oral Q6H  . sertraline  100 mg Oral Daily  . simvastatin  10 mg Oral QHS   Continuous Infusions:    Principal Problem:   Sepsis (HCC) Active Problems:   Hypertension   Hyperlipidemia   Gastroenteritis   Glaucoma   Staphylococcus aureus bacteremia   Diminished pulses in lower extremity   Emesis   Cerebral embolism with cerebral infarction   Bacterial endocarditis   Time spent: 35 minutes   Mahala Menghini MD  Resident Physician [facility] 01/07/2016, 12:58 PM  LOS: 6 days

## 2016-01-07 NOTE — Progress Notes (Signed)
Subjective:  No new complaints   Antibiotics:  Anti-infectives    Start     Dose/Rate Route Frequency Ordered Stop   01/04/16 0200  nafcillin 2 g in dextrose 5 % 100 mL IVPB     2 g 200 mL/hr over 30 Minutes Intravenous Every 4 hours 01/04/16 0151     01/04/16 0045  nafcillin injection 2 g  Status:  Discontinued     2 g Intravenous Every 4 hours 01/04/16 0011 01/04/16 0151   01/01/16 2300  vancomycin (VANCOCIN) IVPB 750 mg/150 ml premix  Status:  Discontinued     750 mg 150 mL/hr over 60 Minutes Intravenous Every 12 hours 01/01/16 1042 01/03/16 1209   01/01/16 1400  ceFAZolin (ANCEF) IVPB 2 g/50 mL premix  Status:  Discontinued     2 g 100 mL/hr over 30 Minutes Intravenous 3 times per day 01/01/16 1041 01/04/16 0011   01/01/16 1200  vancomycin (VANCOCIN) 1,500 mg in sodium chloride 0.9 % 500 mL IVPB     1,500 mg 250 mL/hr over 120 Minutes Intravenous  Once 01/01/16 1041 01/01/16 1512      Medications: Scheduled Meds: .  stroke: mapping our early stages of recovery book   Does not apply Once  . amLODipine  10 mg Oral Daily  . antiseptic oral rinse  7 mL Mouth Rinse BID  . aspirin  81 mg Oral Daily  . brinzolamide  1 drop Both Eyes TID   And  . brimonidine  1 drop Both Eyes TID  . calcium-vitamin D  1 tablet Oral Daily  . famotidine  20 mg Oral Daily  . feeding supplement (ENSURE ENLIVE)  237 mL Oral BID BM  . heparin  5,000 Units Subcutaneous 3 times per day  . latanoprost  1 drop Both Eyes QHS  . metoCLOPramide (REGLAN) injection  10 mg Intravenous 4 times per day  . nafcillin IV  2 g Intravenous Q4H  . nystatin  5 mL Oral QID  . pantoprazole (PROTONIX) IV  40 mg Intravenous Q12H  . potassium chloride  40 mEq Oral Q6H  . sertraline  100 mg Oral Daily  . simvastatin  10 mg Oral QHS   Continuous Infusions:  PRN Meds:.acetaminophen **OR** acetaminophen, albuterol, gi cocktail, haloperidol lactate, LORazepam, morphine injection, nitroGLYCERIN, ondansetron,  polyvinyl alcohol    Objective: Weight change:   Intake/Output Summary (Last 24 hours) at 01/07/16 1422 Last data filed at 01/07/16 1245  Gross per 24 hour  Intake    940 ml  Output      0 ml  Net    940 ml   Blood pressure 141/62, pulse 100, temperature 97.5 F (36.4 C), temperature source Oral, resp. rate 17, height 5' 9.5" (1.765 m), weight 179 lb 0.2 oz (81.2 kg), SpO2 96 %. Temp:  [97.5 F (36.4 C)-98.4 F (36.9 C)] 97.5 F (36.4 C) (02/04 1008) Pulse Rate:  [94-102] 100 (02/04 1100) Resp:  [14-21] 17 (02/04 1100) BP: (141-171)/(62-77) 141/62 mmHg (02/04 1100) SpO2:  [94 %-96 %] 96 % (02/04 1100)  Physical Exam: General: Alert and awake, oriented in discussions with Dr. Tyrone Sage HEENT:  EOMI Left foot wound examined with ulceration no frank pus Neuro: nonfocal  CBC:  CBC Latest Ref Rng 01/07/2016 01/04/2016 01/03/2016  WBC 4.0 - 10.5 K/uL 20.8(H) 25.2(H) 20.0(H)  Hemoglobin 12.0 - 15.0 g/dL 10.8(L) 12.7 12.0  Hematocrit 36.0 - 46.0 % 32.5(L) 37.3 34.6(L)  Platelets 150 - 400 K/uL  299 204 179      BMET  Recent Labs  01/06/16 0542 01/07/16 0909  NA 143 142  K 3.4* 2.9*  CL 108 106  CO2 23 22  GLUCOSE 132* 115*  BUN 45* 31*  CREATININE 1.03* 0.96  CALCIUM 8.3* 8.2*     Liver Panel  No results for input(s): PROT, ALBUMIN, AST, ALT, ALKPHOS, BILITOT, BILIDIR, IBILI in the last 72 hours.     Sedimentation Rate No results for input(s): ESRSEDRATE in the last 72 hours. C-Reactive Protein No results for input(s): CRP in the last 72 hours.  Micro Results: Recent Results (from the past 720 hour(s))  C difficile quick scan w PCR reflex     Status: None   Collection Time: 12/30/15 12:28 AM  Result Value Ref Range Status   C Diff antigen NEGATIVE NEGATIVE Final   C Diff toxin NEGATIVE NEGATIVE Final   C Diff interpretation Negative for toxigenic C. difficile  Final    Comment: NEGATIVE  Culture, blood (routine x 2)     Status: None   Collection Time:  12/31/15 12:16 PM  Result Value Ref Range Status   Specimen Description BLOOD RIGHT ARM  Final   Special Requests BOTTLES DRAWN AEROBIC AND ANAEROBIC 6CC  Final   Culture  Setup Time   Final    GRAM POSITIVE COCCI IN CLUSTERS IN BOTH AEROBIC AND ANAEROBIC BOTTLES CRITICAL RESULT CALLED TO, READ BACK BY AND VERIFIED WITH: Carnella Guadalajara, J. AT 0035 ON 01/01/2016 BY AGUNDIZ,E.    Culture   Final    STAPHYLOCOCCUS AUREUS Performed at [facility]    Report Status 01/03/2016 FINAL  Final   Organism ID, Bacteria STAPHYLOCOCCUS AUREUS  Final      Susceptibility   Staphylococcus aureus - MIC*    CIPROFLOXACIN <=0.5 SENSITIVE Sensitive     ERYTHROMYCIN <=0.25 SENSITIVE Sensitive     GENTAMICIN <=0.5 SENSITIVE Sensitive     OXACILLIN <=0.25 SENSITIVE Sensitive     TETRACYCLINE <=1 SENSITIVE Sensitive     VANCOMYCIN <=0.5 SENSITIVE Sensitive     TRIMETH/SULFA <=10 SENSITIVE Sensitive     CLINDAMYCIN <=0.25 SENSITIVE Sensitive     RIFAMPIN <=0.5 SENSITIVE Sensitive     Inducible Clindamycin NEGATIVE Sensitive     * STAPHYLOCOCCUS AUREUS  Culture, blood (routine x 2)     Status: None   Collection Time: 12/31/15 12:33 PM  Result Value Ref Range Status   Specimen Description BLOOD RIGHT ARM  Final   Special Requests BOTTLES DRAWN AEROBIC AND ANAEROBIC 6CC  Final   Culture  Setup Time   Final    GRAM POSITIVE COCCI IN CLUSTERS IN BOTH AEROBIC AND ANAEROBIC BOTTLES CRITICAL RESULT CALLED TO, READ BACK BY AND VERIFIED WITH: LAVINDER,J. AT 1610 ON 01/01/2016 BY AGUNDIZ,E.    Culture   Final    STAPHYLOCOCCUS AUREUS SUSCEPTIBILITIES PERFORMED ON PREVIOUS CULTURE WITHIN THE LAST 5 DAYS. Performed at [facility]    Report Status 01/03/2016 FINAL  Final  Culture, blood (Routine X 2) w Reflex to ID Panel     Status: None (Preliminary result)   Collection Time: 01/02/16  2:11 PM  Result Value Ref Range Status   Specimen Description BLOOD RIGHT ANTECUBITAL  Final   Special Requests    Final    BOTTLES DRAWN AEROBIC AND ANAEROBIC 6CC EACH  IMMUNE:COMPROMISED   Culture NO GROWTH 4 DAYS  Final   Report Status PENDING  Incomplete  Culture, blood (Routine X 2) w Reflex  to ID Panel     Status: None (Preliminary result)   Collection Time: 01/02/16  2:13 PM  Result Value Ref Range Status   Specimen Description BLOOD RIGHT HAND  Final   Special Requests   Final    BOTTLES DRAWN AEROBIC AND ANAEROBIC 6CC  IMMUNE:COMPROMISED   Culture NO GROWTH 4 DAYS  Final   Report Status PENDING  Incomplete  Wound culture     Status: None   Collection Time: 01/03/16  6:43 PM  Result Value Ref Range Status   Specimen Description WOUND LEFT FOOT  Final   Special Requests NONE  Final   Gram Stain   Final    NO WBC SEEN NO SQUAMOUS EPITHELIAL CELLS SEEN NO ORGANISMS SEEN Performed at [facility]    Culture   Final    MODERATE STAPHYLOCOCCUS AUREUS Note: RIFAMPIN AND GENTAMICIN SHOULD NOT BE USED AS SINGLE DRUGS FOR TREATMENT OF STAPH INFECTIONS. Performed at [facility]    Report Status 01/06/2016 FINAL  Final   Organism ID, Bacteria STAPHYLOCOCCUS AUREUS  Final      Susceptibility   Staphylococcus aureus - MIC*    CLINDAMYCIN <=0.25 SENSITIVE Sensitive     ERYTHROMYCIN <=0.25 SENSITIVE Sensitive     GENTAMICIN <=0.5 SENSITIVE Sensitive     LEVOFLOXACIN <=0.12 SENSITIVE Sensitive     OXACILLIN <=0.25 SENSITIVE Sensitive     RIFAMPIN <=0.5 SENSITIVE Sensitive     TRIMETH/SULFA <=10 SENSITIVE Sensitive     VANCOMYCIN 1 SENSITIVE Sensitive     TETRACYCLINE <=1 SENSITIVE Sensitive     MOXIFLOXACIN <=0.25 SENSITIVE Sensitive     * MODERATE STAPHYLOCOCCUS AUREUS  Culture, blood (routine x 2)     Status: None (Preliminary result)   Collection Time: 01/05/16  8:06 PM  Result Value Ref Range Status   Specimen Description BLOOD RIGHT ARM  Final   Special Requests IN PEDIATRIC BOTTLE 4CC  Final   Culture  Setup Time   Final    GRAM POSITIVE COCCI IN CLUSTERS AEROBIC  BOTTLE ONLY CRITICAL RESULT CALLED TO, READ BACK BY AND VERIFIED WITH: Claria Dice RN 1556 01/06/16 A BROWNING    Culture STAPHYLOCOCCUS AUREUS  Final   Report Status PENDING  Incomplete  Culture, blood (routine x 2)     Status: None (Preliminary result)   Collection Time: 01/05/16  8:08 PM  Result Value Ref Range Status   Specimen Description BLOOD RIGHT HAND  Final   Special Requests BOTTLES DRAWN AEROBIC ONLY 9CC  Final   Culture NO GROWTH 2 DAYS  Final   Report Status PENDING  Incomplete  Culture, blood (routine x 2)     Status: None (Preliminary result)   Collection Time: 01/06/16  4:55 PM  Result Value Ref Range Status   Specimen Description BLOOD LEFT ANTECUBITAL  Final   Special Requests BOTTLES DRAWN AEROBIC AND ANAEROBIC 10CC  Final   Culture NO GROWTH < 24 HOURS  Final   Report Status PENDING  Incomplete  Culture, blood (routine x 2)     Status: None (Preliminary result)   Collection Time: 01/06/16  4:59 PM  Result Value Ref Range Status   Specimen Description BLOOD BLOOD LEFT HAND  Final   Special Requests BOTTLES DRAWN AEROBIC AND ANAEROBIC 10CC  Final   Culture NO GROWTH < 24 HOURS  Final   Report Status PENDING  Incomplete    Studies/Results: Ct Angio Head W/cm &/or Wo Cm  01/06/2016  CLINICAL DATA:  Recent posterior  fossa and left frontal lobe infarcts. Dull headache. Pain behind the right ear. EXAM: CT ANGIOGRAPHY HEAD AND NECK TECHNIQUE: Multidetector CT imaging of the head and neck was performed using the standard protocol during bolus administration of intravenous contrast. Multiplanar CT image reconstructions and MIPs were obtained to evaluate the vascular anatomy. Carotid stenosis measurements (when applicable) are obtained utilizing NASCET criteria, using the distal internal carotid diameter as the denominator. CONTRAST:  80mL OMNIPAQUE IOHEXOL 350 MG/ML SOLN COMPARISON:  MRI brain 01/04/2016. FINDINGS: CT HEAD Brain: Moderate periventricular and subcortical white  matter hypo attenuation is again noted. The recent punctate infarcts are not evident by CT. The ventricles are of normal size. No significant extraaxial fluid collection is present. Calvarium and skull base: Within normal limits Paranasal sinuses: Clear Orbits: Bilateral lens replacements are present. The globes and orbits are otherwise intact. CTA NECK Aortic arch: A 3 vessel arch configuration is present. Atherosclerotic changes are present in the aortic arch. There is no significant stenosis. Right carotid system: There is mild tortuosity of the cervical right ICA. The right carotid bifurcation is normal. There is proximal tortuosity of the cervical right ICA. There is some irregularity and beading of the mid cervical right ICA without a significant stenosis. Left carotid system: The left common carotid artery is mildly tortuous. There is no significant focal stenosis. There is no significant stenosis at the bifurcation. There is some irregularity and beading of the mid cervical left ICA without significant focal stenosis. Vertebral arteries:The vertebral arteries both originate from the subclavian arteries. Proximal tortuosity is present without significant stenosis. There is no focal stenosis of the vertebral arteries in the neck. Skeleton: Degenerative endplate change and osseous spurring is evident bilaterally at C6-7. There is slight degenerative anterolisthesis at C5-6. No focal lytic or blastic lesions are present. Other neck: No focal mucosal or submucosal lesions are present. The salivary glands are within normal limits. No significant adenopathy is present. There is some pleural thickening posteriorly in both lower lobes. Minimal atelectasis is present. Lungs are otherwise clear. The mediastinum is within normal limits. CTA HEAD Anterior circulation: Atherosclerotic changes are noted within the cavernous internal carotid arteries bilaterally. There is a 50% supraclinoid stenosis bilaterally relative to  the terminal ICA. No other significant focal stenosis is present. The right A1 segment is hypoplastic. There is a moderate stenosis of the mid right A1 segment. The left A1 segment is dominant in the anterior communicating artery is patent. The MCA bifurcations are intact bilaterally. Multiple mild segmental stenoses are present in the MCA and ACA branch vessels bilaterally. Moderate medium and distal small vessel stenoses are present in the MCA a bath branches bilaterally without a significant proximal occlusion. Posterior circulation: The left vertebral artery is slightly dominant to the right. The PICA origins are visualized and normal. The basilar artery is intact. Both posterior cerebral arteries originate from the basilar tip. There is moderate attenuation of distal PCA branch vessels. A high-grade stenosis is present in the proximal inferior right P3 division. Venous sinuses: The dural sinuses are patent. The straight sinus and deep cerebral veins are intact. The cortical veins are within normal limits. Anatomic variants: None Delayed phase: The postcontrast images demonstrate no pathologic enhancement. IMPRESSION: 1. Punctate infarcts involving the left cerebellum and left frontal lobe are not visualized by CT. 2. No new or acute cortical infarct. 3. Moderate periventricular and subcortical white matter disease is again seen. 4. Atherosclerotic changes at the aortic arch without significant stenosis. 5. Approximately 50% stenosis  within the supraclinoid internal carotid artery bilaterally. 6. Moderate medium and small vessel disease in both the anterior and posterior circulation. 7. Near occlusive stenosis in the inferior right P3 division. 8. Mild spondylosis of the cervical spine as described Electronically Signed   By: Marin Roberts M.D.   On: 01/06/2016 11:57   Ct Angio Neck W/cm &/or Wo/cm  01/06/2016  CLINICAL DATA:  Recent posterior fossa and left frontal lobe infarcts. Dull headache. Pain  behind the right ear. EXAM: CT ANGIOGRAPHY HEAD AND NECK TECHNIQUE: Multidetector CT imaging of the head and neck was performed using the standard protocol during bolus administration of intravenous contrast. Multiplanar CT image reconstructions and MIPs were obtained to evaluate the vascular anatomy. Carotid stenosis measurements (when applicable) are obtained utilizing NASCET criteria, using the distal internal carotid diameter as the denominator. CONTRAST:  80mL OMNIPAQUE IOHEXOL 350 MG/ML SOLN COMPARISON:  MRI brain 01/04/2016. FINDINGS: CT HEAD Brain: Moderate periventricular and subcortical white matter hypo attenuation is again noted. The recent punctate infarcts are not evident by CT. The ventricles are of normal size. No significant extraaxial fluid collection is present. Calvarium and skull base: Within normal limits Paranasal sinuses: Clear Orbits: Bilateral lens replacements are present. The globes and orbits are otherwise intact. CTA NECK Aortic arch: A 3 vessel arch configuration is present. Atherosclerotic changes are present in the aortic arch. There is no significant stenosis. Right carotid system: There is mild tortuosity of the cervical right ICA. The right carotid bifurcation is normal. There is proximal tortuosity of the cervical right ICA. There is some irregularity and beading of the mid cervical right ICA without a significant stenosis. Left carotid system: The left common carotid artery is mildly tortuous. There is no significant focal stenosis. There is no significant stenosis at the bifurcation. There is some irregularity and beading of the mid cervical left ICA without significant focal stenosis. Vertebral arteries:The vertebral arteries both originate from the subclavian arteries. Proximal tortuosity is present without significant stenosis. There is no focal stenosis of the vertebral arteries in the neck. Skeleton: Degenerative endplate change and osseous spurring is evident bilaterally  at C6-7. There is slight degenerative anterolisthesis at C5-6. No focal lytic or blastic lesions are present. Other neck: No focal mucosal or submucosal lesions are present. The salivary glands are within normal limits. No significant adenopathy is present. There is some pleural thickening posteriorly in both lower lobes. Minimal atelectasis is present. Lungs are otherwise clear. The mediastinum is within normal limits. CTA HEAD Anterior circulation: Atherosclerotic changes are noted within the cavernous internal carotid arteries bilaterally. There is a 50% supraclinoid stenosis bilaterally relative to the terminal ICA. No other significant focal stenosis is present. The right A1 segment is hypoplastic. There is a moderate stenosis of the mid right A1 segment. The left A1 segment is dominant in the anterior communicating artery is patent. The MCA bifurcations are intact bilaterally. Multiple mild segmental stenoses are present in the MCA and ACA branch vessels bilaterally. Moderate medium and distal small vessel stenoses are present in the MCA a bath branches bilaterally without a significant proximal occlusion. Posterior circulation: The left vertebral artery is slightly dominant to the right. The PICA origins are visualized and normal. The basilar artery is intact. Both posterior cerebral arteries originate from the basilar tip. There is moderate attenuation of distal PCA branch vessels. A high-grade stenosis is present in the proximal inferior right P3 division. Venous sinuses: The dural sinuses are patent. The straight sinus and deep cerebral veins  are intact. The cortical veins are within normal limits. Anatomic variants: None Delayed phase: The postcontrast images demonstrate no pathologic enhancement. IMPRESSION: 1. Punctate infarcts involving the left cerebellum and left frontal lobe are not visualized by CT. 2. No new or acute cortical infarct. 3. Moderate periventricular and subcortical white matter  disease is again seen. 4. Atherosclerotic changes at the aortic arch without significant stenosis. 5. Approximately 50% stenosis within the supraclinoid internal carotid artery bilaterally. 6. Moderate medium and small vessel disease in both the anterior and posterior circulation. 7. Near occlusive stenosis in the inferior right P3 division. 8. Mild spondylosis of the cervical spine as described Electronically Signed   By: Marin Roberts M.D.   On: 01/06/2016 11:57   Dg Chest Port 1 View  01/06/2016  CLINICAL DATA:  Chest pain EXAM: PORTABLE CHEST 1 VIEW COMPARISON:  01/01/2016 FINDINGS: Cardiac shadow is within normal limits. Elevation of the right hemidiaphragm is again noted. Mild rotation to the left is noted accentuating the mediastinal markings. No focal infiltrate is seen. No acute bony abnormality seen. IMPRESSION: No acute abnormality noted. Electronically Signed   By: Alcide Clever M.D.   On: 01/06/2016 09:02      Assessment/Plan:  INTERVAL HISTORY:  01/05/06   Principal Problem:   Sepsis (HCC) Active Problems:   Hypertension   Hyperlipidemia   Gastroenteritis   Glaucoma   Staphylococcus aureus bacteremia   Diminished pulses in lower extremity   Emesis   Cerebral embolism with cerebral infarction   Bacterial endocarditis    Cassandra [name] is a 82 y.o. female with  Native valve endocarditis with CNS emboli, with source of her infection likely being chronic osteomyelitis that persisted after amputation of big toe in 09/2014. Her vegetation is very long but not wide diameter. The greatest concern is that her MSSA bacteremia which was diagnosed on blood culture from 12/31/15 and had appeared to cleared from her blood by 01/02/2016 now appears to occurred again on blood cultures drawn on 01/05/2016.  #1 Methicillin sensitive staph coccus aureus native valve endocarditis with large lengthwise vegetation with CNS emboli and osteomyelitis of her  left foot, now with recurrent  bacteremia despite being on appropriate antibiotic therapy.  --continue Nafcillin for CNS penetration --agree with the peak surgery consultation. I think the patient needs a more proximally dictation to eradicate infection in her foot completely. --Follow-up and repeat blood cultures in February 3 --Greatly appreciate cardiology cardiothoracic surgery orthopedic surgery, Neurology  and the primary team following the patient very closely.  I spent greater than 40 minutes with the patient including greater than 50% of time in face to face counsel of the patient and in coordination of her care with Dr. Tyrone Sage and Dr. Vanessa Barbara.   LOS: 6 days   Acey Lav 01/07/2016, 2:22 PM

## 2016-01-08 ENCOUNTER — Encounter (HOSPITAL_COMMUNITY)
Admission: EM | Disposition: A | Discharge status: Skilled Nursing Facility | Payer: Self-pay | Source: Home / Self Care | Attending: Internal Medicine

## 2016-01-08 ENCOUNTER — Inpatient Hospital Stay (HOSPITAL_COMMUNITY): Payer: Medicare Other | Admitting: Anesthesiology

## 2016-01-08 ENCOUNTER — Encounter (HOSPITAL_COMMUNITY): Payer: Self-pay | Admitting: Anesthesiology

## 2016-01-08 DIAGNOSIS — R0789 Other chest pain: Secondary | ICD-10-CM

## 2016-01-08 DIAGNOSIS — M86672 Other chronic osteomyelitis, left ankle and foot: Secondary | ICD-10-CM

## 2016-01-08 DIAGNOSIS — R195 Other fecal abnormalities: Secondary | ICD-10-CM | POA: Insufficient documentation

## 2016-01-08 DIAGNOSIS — R079 Chest pain, unspecified: Secondary | ICD-10-CM | POA: Insufficient documentation

## 2016-01-08 DIAGNOSIS — I209 Angina pectoris, unspecified: Secondary | ICD-10-CM

## 2016-01-08 DIAGNOSIS — R197 Diarrhea, unspecified: Secondary | ICD-10-CM

## 2016-01-08 DIAGNOSIS — R0602 Shortness of breath: Secondary | ICD-10-CM

## 2016-01-08 HISTORY — PX: AMPUTATION: SHX166

## 2016-01-08 LAB — CULTURE, BLOOD (ROUTINE X 2)
CULTURE: NO GROWTH
Culture: NO GROWTH

## 2016-01-08 LAB — C DIFFICILE QUICK SCREEN W PCR REFLEX
C DIFFICILE (CDIFF) TOXIN: NEGATIVE
C DIFFICLE (CDIFF) ANTIGEN: NEGATIVE
C Diff interpretation: NEGATIVE

## 2016-01-08 LAB — BASIC METABOLIC PANEL
Anion gap: 8 (ref 5–15)
BUN: 26 mg/dL — ABNORMAL HIGH (ref 6–20)
CHLORIDE: 111 mmol/L (ref 101–111)
CO2: 25 mmol/L (ref 22–32)
CREATININE: 0.87 mg/dL (ref 0.44–1.00)
Calcium: 7.8 mg/dL — ABNORMAL LOW (ref 8.9–10.3)
GFR calc non Af Amer: >60 mL/min (ref >60)
Glucose, Bld: 125 mg/dL — ABNORMAL HIGH (ref 65–99)
Potassium: 3.7 mmol/L (ref 3.5–5.1)
Sodium: 144 mmol/L (ref 135–145)

## 2016-01-08 LAB — CBC
HCT: 29.9 % — ABNORMAL LOW (ref 36.0–46.0)
Hemoglobin: 10.1 g/dL — ABNORMAL LOW (ref 12.0–15.0)
MCH: 30.9 pg (ref 26.0–34.0)
MCHC: 33.8 g/dL (ref 30.0–36.0)
MCV: 91.4 fL (ref 78.0–100.0)
PLATELETS: 330 10*3/uL (ref 150–400)
RBC: 3.27 MIL/uL — AB (ref 3.87–5.11)
RDW: 14.9 % (ref 11.5–15.5)
WBC: 19.7 10*3/uL — ABNORMAL HIGH (ref 4.0–10.5)

## 2016-01-08 LAB — SURGICAL PCR SCREEN
MRSA, PCR: NEGATIVE
STAPHYLOCOCCUS AUREUS: POSITIVE — AB

## 2016-01-08 SURGERY — AMPUTATION, FOOT, PARTIAL
Anesthesia: Monitor Anesthesia Care | Site: Foot | Laterality: Left

## 2016-01-08 MED ORDER — KETAMINE HCL 100 MG/ML IJ SOLN
INTRAMUSCULAR | Status: AC
  Filled 2016-01-08: qty 1

## 2016-01-08 MED ORDER — LACTATED RINGERS IV SOLN
INTRAVENOUS | Status: DC | PRN
  Administered 2016-01-08: 16:00:00 via INTRAVENOUS

## 2016-01-08 MED ORDER — ETOMIDATE 2 MG/ML IV SOLN
INTRAVENOUS | Status: AC
  Filled 2016-01-08: qty 10

## 2016-01-08 MED ORDER — BRINZOLAMIDE 1 % OP SUSP
1.0000 [drp] | Freq: Three times a day (TID) | OPHTHALMIC | Status: DC
  Administered 2016-01-08 – 2016-01-13 (×15): 1 [drp] via OPHTHALMIC
  Filled 2016-01-08: qty 10

## 2016-01-08 MED ORDER — SODIUM CHLORIDE 0.9 % IV SOLN
INTRAVENOUS | Status: DC
  Administered 2016-01-08: 15:00:00 via INTRAVENOUS

## 2016-01-08 MED ORDER — FENTANYL CITRATE (PF) 250 MCG/5ML IJ SOLN
INTRAMUSCULAR | Status: AC
  Filled 2016-01-08: qty 5

## 2016-01-08 MED ORDER — BRIMONIDINE TARTRATE 0.2 % OP SOLN
1.0000 [drp] | Freq: Three times a day (TID) | OPHTHALMIC | Status: DC
  Administered 2016-01-08 – 2016-01-13 (×15): 1 [drp] via OPHTHALMIC
  Filled 2016-01-08: qty 5

## 2016-01-08 MED ORDER — SODIUM CHLORIDE 0.9 % IV SOLN
INTRAVENOUS | Status: DC
  Administered 2016-01-09: 15:00:00 via INTRAVENOUS

## 2016-01-08 MED ORDER — PHENYLEPHRINE HCL 10 MG/ML IJ SOLN
INTRAMUSCULAR | Status: AC
  Filled 2016-01-08: qty 1

## 2016-01-08 MED ORDER — 0.9 % SODIUM CHLORIDE (POUR BTL) OPTIME
TOPICAL | Status: DC | PRN
  Administered 2016-01-08: 1000 mL

## 2016-01-08 MED ORDER — PROPOFOL 10 MG/ML IV BOLUS
INTRAVENOUS | Status: DC | PRN
  Administered 2016-01-08 (×5): 10 mg via INTRAVENOUS

## 2016-01-08 MED ORDER — LIDOCAINE HCL (CARDIAC) 20 MG/ML IV SOLN
INTRAVENOUS | Status: AC
  Filled 2016-01-08: qty 10

## 2016-01-08 MED ORDER — FENTANYL CITRATE (PF) 250 MCG/5ML IJ SOLN
INTRAMUSCULAR | Status: DC | PRN
  Administered 2016-01-08: 25 ug via INTRAVENOUS

## 2016-01-08 MED ORDER — FENTANYL CITRATE (PF) 100 MCG/2ML IJ SOLN: 25.0000 ug | INTRAMUSCULAR | Status: DC | PRN

## 2016-01-08 MED ORDER — KETAMINE HCL 100 MG/ML IJ SOLN
INTRAMUSCULAR | Status: DC | PRN
  Administered 2016-01-08 (×5): 1 mg via INTRAVENOUS

## 2016-01-08 MED ORDER — CHLORHEXIDINE GLUCONATE CLOTH 2 % EX PADS
6.0000 | MEDICATED_PAD | Freq: Every day | CUTANEOUS | Status: AC
  Administered 2016-01-08 – 2016-01-12 (×5): 6 via TOPICAL

## 2016-01-08 MED ORDER — SUCCINYLCHOLINE CHLORIDE 20 MG/ML IJ SOLN
INTRAMUSCULAR | Status: AC
  Filled 2016-01-08: qty 2

## 2016-01-08 MED ORDER — MUPIROCIN 2 % EX OINT
1.0000 "application " | TOPICAL_OINTMENT | Freq: Two times a day (BID) | CUTANEOUS | Status: AC
  Administered 2016-01-08 – 2016-01-13 (×10): 1 via NASAL
  Filled 2016-01-08 (×3): qty 22

## 2016-01-08 MED ORDER — PROPOFOL 10 MG/ML IV BOLUS
INTRAVENOUS | Status: AC
  Filled 2016-01-08: qty 40

## 2016-01-08 MED ORDER — STERILE WATER FOR INJECTION IJ SOLN
INTRAMUSCULAR | Status: AC
  Filled 2016-01-08: qty 10

## 2016-01-08 MED ORDER — CHLORHEXIDINE GLUCONATE 4 % EX LIQD
60.0000 mL | Freq: Once | CUTANEOUS | Status: AC
  Administered 2016-01-08: 4 via TOPICAL
  Filled 2016-01-08: qty 60

## 2016-01-08 MED ORDER — EPHEDRINE SULFATE 50 MG/ML IJ SOLN
INTRAMUSCULAR | Status: AC
  Filled 2016-01-08: qty 1

## 2016-01-08 MED ORDER — SODIUM CHLORIDE 0.9 % IJ SOLN
INTRAMUSCULAR | Status: AC
  Filled 2016-01-08: qty 10

## 2016-01-08 MED ORDER — PANTOPRAZOLE SODIUM 40 MG PO TBEC
40.0000 mg | DELAYED_RELEASE_TABLET | Freq: Two times a day (BID) | ORAL | Status: DC
  Administered 2016-01-08 – 2016-01-13 (×10): 40 mg via ORAL
  Filled 2016-01-08 (×10): qty 1

## 2016-01-08 MED ORDER — BUPIVACAINE-EPINEPHRINE (PF) 0.5% -1:200000 IJ SOLN
INTRAMUSCULAR | Status: DC | PRN
  Administered 2016-01-08: 30 mL via PERINEURAL

## 2016-01-08 MED ORDER — ISOSORBIDE MONONITRATE ER 30 MG PO TB24
30.0000 mg | ORAL_TABLET | Freq: Every day | ORAL | Status: DC
  Administered 2016-01-08 – 2016-01-13 (×6): 30 mg via ORAL
  Filled 2016-01-08 (×7): qty 1

## 2016-01-08 SURGICAL SUPPLY — 47 items
BANDAGE ACE 4X5 VEL STRL LF (GAUZE/BANDAGES/DRESSINGS) IMPLANT
BANDAGE ACE 6X5 VEL STRL LF (GAUZE/BANDAGES/DRESSINGS) IMPLANT
BANDAGE ELASTIC 4 VELCRO ST LF (GAUZE/BANDAGES/DRESSINGS) ×2 IMPLANT
BANDAGE ELASTIC 6 VELCRO ST LF (GAUZE/BANDAGES/DRESSINGS) ×2 IMPLANT
BLADE SAW SGTL MED 73X18.5 STR (BLADE) ×1 IMPLANT
BNDG CMPR 9X4 STRL LF SNTH (GAUZE/BANDAGES/DRESSINGS) ×1
BNDG ESMARK 4X9 LF (GAUZE/BANDAGES/DRESSINGS) ×2 IMPLANT
CANISTER SUCT 3000ML PPV (MISCELLANEOUS) ×2 IMPLANT
CUFF TOURNIQUET SINGLE 34IN LL (TOURNIQUET CUFF) ×1 IMPLANT
CUFF TOURNIQUET SINGLE 44IN (TOURNIQUET CUFF) IMPLANT
DRAPE U-SHAPE 47X51 STRL (DRAPES) ×4 IMPLANT
DRSG ADAPTIC 3X8 NADH LF (GAUZE/BANDAGES/DRESSINGS) ×2 IMPLANT
DRSG MEPITEL 4X7.2 (GAUZE/BANDAGES/DRESSINGS) ×1 IMPLANT
DRSG PAD ABDOMINAL 8X10 ST (GAUZE/BANDAGES/DRESSINGS) ×3 IMPLANT
DURAPREP 26ML APPLICATOR (WOUND CARE) ×2 IMPLANT
ELECT REM PT RETURN 9FT ADLT (ELECTROSURGICAL) ×2
ELECTRODE REM PT RTRN 9FT ADLT (ELECTROSURGICAL) ×1 IMPLANT
GAUZE SPONGE 4X4 12PLY STRL (GAUZE/BANDAGES/DRESSINGS) ×2 IMPLANT
GLOVE BIO SURGEON STRL SZ8 (GLOVE) ×4 IMPLANT
GLOVE BIOGEL PI IND STRL 8 (GLOVE) ×2 IMPLANT
GLOVE BIOGEL PI INDICATOR 8 (GLOVE) ×1
GLOVE ECLIPSE 7.5 STRL STRAW (GLOVE) ×3 IMPLANT
GOWN STRL REUS W/ TWL LRG LVL3 (GOWN DISPOSABLE) ×1 IMPLANT
GOWN STRL REUS W/ TWL XL LVL3 (GOWN DISPOSABLE) ×2 IMPLANT
GOWN STRL REUS W/TWL LRG LVL3 (GOWN DISPOSABLE) ×2
GOWN STRL REUS W/TWL XL LVL3 (GOWN DISPOSABLE) ×4
KIT BASIN OR (CUSTOM PROCEDURE TRAY) ×2 IMPLANT
KIT ROOM TURNOVER OR (KITS) ×2 IMPLANT
NEEDLE 25GAX1.5 (MISCELLANEOUS) IMPLANT
NS IRRIG 1000ML POUR BTL (IV SOLUTION) ×2 IMPLANT
PACK ORTHO EXTREMITY (CUSTOM PROCEDURE TRAY) ×2 IMPLANT
PAD ARMBOARD 7.5X6 YLW CONV (MISCELLANEOUS) ×4 IMPLANT
PAD CAST 4YDX4 CTTN HI CHSV (CAST SUPPLIES) ×1 IMPLANT
PADDING CAST ABS 6INX4YD NS (CAST SUPPLIES) ×1
PADDING CAST ABS COTTON 6X4 NS (CAST SUPPLIES) ×1 IMPLANT
PADDING CAST COTTON 4X4 STRL (CAST SUPPLIES) ×2
PADDING CAST COTTON 6X4 STRL (CAST SUPPLIES) ×1 IMPLANT
SPONGE LAP 18X18 X RAY DECT (DISPOSABLE) ×2 IMPLANT
STOCKINETTE IMPERVIOUS LG (DRAPES) IMPLANT
SUCTION FRAZIER HANDLE 10FR (MISCELLANEOUS) ×1
SUCTION TUBE FRAZIER 10FR DISP (MISCELLANEOUS) ×1 IMPLANT
SUT ETHILON 2 0 PSLX (SUTURE) ×4 IMPLANT
SYR CONTROL 10ML LL (SYRINGE) IMPLANT
TOWEL OR 17X24 6PK STRL BLUE (TOWEL DISPOSABLE) ×2 IMPLANT
TOWEL OR 17X26 10 PK STRL BLUE (TOWEL DISPOSABLE) ×2 IMPLANT
TUBE CONNECTING 12X1/4 (SUCTIONS) ×2 IMPLANT
UNDERPAD 30X30 INCONTINENT (UNDERPADS AND DIAPERS) ×2 IMPLANT

## 2016-01-08 NOTE — Progress Notes (Signed)
Pt c/o of 8/10 cp around 07:45 this morning. VS stable & charted. An ekg was obtained with no change from previous ekg. Pt given 3 SL nitro & 1 mg of morphine. The hospitalist as well as the cardiologist on call notified. Pt's pain is currently down to 4 out of 10 & the pt says she feels much better. Will continue to monitor the pt.  Sanda Linger, RN

## 2016-01-08 NOTE — Progress Notes (Signed)
[facility] PROGRESS NOTE  Cassandra [name] RUE:[id number] DOB: Feb 28, 1933 DOA: 12/29/2015 PCP: Colette Ribas, MD  Summary:   82 year old female who presented to the hospital with complaints of nausea, vomiting and abdominal pain. She was found to have a possible viral gastroenteritis and was treated supportively. During her hospital stay, she developed significant fevers. Blood cultures were sent and returned positive for MSSA bacteremia. He is not entirely clear. It may be related to the chronic wound on her foot. MRI of the foot could not rule out underlying osteoarthritis versus abscess. Podiatry has been consulted. Currently she is on intravenous antibiotics. Anticipate that she'll be in the hospital several more days. Eventual plan will be to discharge to skilled nursing facility.  Assessment/Plan: 1. Sepsis/Endocarditis. Present on admission. Evidenced by blood cultures from 12/31/2015 growing  MSSA, white count of 25,200, having a temp of 101.7 on admission. Case was discussed with Dr. Ilsa Iha of infectious disease who will consult. She is currently on Nafcillin 2 g IV every 4 hours. With MRI of brain showing multiple scattered infarcts there was concern for septic emboli. She underwent a transesophageal echocardiogram on 01/05/2016 which revealed large hypermobile mitral valve vegetation. Source of bacteremia not entirely clear at this time, questionable source of foot ulcer. CT surgery consulted for recommendations on surgical intervention. Neurology reporting that patient would be clear for surgery if CTA negative for mycotic aneurysms. Case was discussed with CT surgery and ID, recommended consulting orthopedic surgery to assess wound at left foot. Repeat blood cultures from 01/06/2016: 1/2 positive for MSSA. Repeat blood cultures from 01/06/2016 showing no growth to date.         She does not have evidence of heart failure.       Cardiology consulted. Patient may possibly undergo Heart  Cath and MVR next week if repeat blood cultures remain sterile.   2. Septic emboli- MRI performed 01/04/2016 showed Multiple (approximately 6) scattered punctate acute lacunar infarcts in the brain. Left MCA, bilateral PCA, and SCA territories are affected. This was further workup with transesophageal echocardiogram that revealed mitral valve vegetation. She is on IV antibiotic therapy with nafcillin. CT surgery consulted to determine if she is a candidate for surgical intervention. From a neurologic standpoint she seems stable.  3. Chronic left foot wound . MRI of foot shows possible underlying osteomyelitis, cannot rule out underlying abscess. May be related to bacteremia. Podiatry consulted, she was evaluated by Dr.McKinney at [facility]. Wound culture grew MSSA. On 01/07/2016 Case discussed with Dr Shon Baton of Orthopedic Surgery for evaluation of foot and MRI findings. Suspect this to be source of infection.       Plan for possible transmetatarsal amputation of left foot for chronic osteomyelitis.  4. Nausea/Vomiting/diarrhea/abd pain. Pet patient granddaughter this is improved as patient has been stating less frequently she is in pain. Previous Abd/pelvis CT negative for acute findings. Continue symptomatic management and IVF.   5. Chest Pain. Patient having an episode of CP this morning, spoke with cardiology added Imdur.  6. Dehydration, IV fluids stopped given concerns for fluid overload. 7. Hypokalemia, potassium 3.8 on 01/04/2016 after replacement. 8. Weakness, likely related to # 1, occupational therapy recommending inpatient rehabilitation, also placed. 9. HTN, BP elevated- ? Relation to emboli and perfusion deficits seen on MRI. Remains hypertensive, will increase Norvasc from 5 mg PO q daily to 10 mg PO q daily.  10. HLD, continue statin   Code Status: Full DVT prophylaxis: Heparin Family Communication: I spoke  with her daughter and granddaughter who were present at  bedside Disposition Plan: Plan for transmet amputation   Consultants:  PT - SNF  Infectious Disease  Podiatry  Neurology  Cardiology  Orthopedic surgery  Procedures:  ECHO Study Conclusions- Left ventricle: The cavity size was normal. Systolic function was vigorous. The estimated ejection fraction was in the range of 65% to 70%. Wall motion was normal; there were no regional wall motion abnormalities. Doppler parameters are consistent with abnormal left ventricular relaxation (grade 1 diastolic dysfunction). There was no evidence of elevated ventricular filling pressure by Doppler parameters. - Aortic valve: Trileaflet; normal thickness leaflets. There was no regurgitation. - Aortic root: The aortic root was normal in size. - Mitral valve: Structurally normal valve. There was mild regurgitation. - Left atrium: The atrium was mildly dilated. - Right ventricle: Systolic function was normal. - Tricuspid valve: There was mild regurgitation. - Pulmonic valve: There was no regurgitation. - Pulmonary arteries: Systolic pressure was moderately increased. PA peak pressure: 48 mm Hg (S). - Inferior vena cava: The vessel was normal in size. - Pericardium, extracardiac: There was no pericardial effusion.  Antibiotics:  Vancomycin 1/29>>1/31  Cefazolin 1/29>> 2/1  Nafcillin 2/1>>  HPI/Subjective: Patient is awake and alert today, family members at bedside. She presently denies CP.   Objective: Filed Vitals:   01/08/16 1010 01/08/16 1218  BP: 162/73 141/62  Pulse:  110  Temp:  97.7 F (36.5 C)  Resp:      Intake/Output Summary (Last 24 hours) at 01/08/16 1503 Last data filed at 01/08/16 0701  Gross per 24 hour  Intake    540 ml  Output      0 ml  Net    540 ml   Filed Weights   12/29/15 0759 12/29/15 1957  Weight: 80.74 kg (178 lb) 81.2 kg (179 lb 0.2 oz)    Exam: General: NAD. She is awake and alert.  Cardiovascular: Regular rate and  rhythm. I/VI systolic murmur heard best at aortic and pulmonic area  Respiratory: CTAB. No wheezes, rales, or rhonchi.   Abdomen: soft, non tender, no distention, bowel sounds normal Musculoskeletal: No edema b/l. S/p left greater toe amputated. Min erythema over amputation site.     Data Reviewed: Basic Metabolic Panel:  Recent Labs Lab 01/03/16 0525 01/04/16 0542 01/06/16 0542 01/07/16 0909 01/08/16 0540  NA 133* 136 143 142 144  K 3.7 3.8 3.4* 2.9* 3.7  CL 106 106 108 106 111  CO2 20* 21* GLUCOSE 126* 129* 132* 115* 125*  BUN 37* 47* 45* 31* 26*  CREATININE 0.91 1.05* 1.03* 0.96 0.87  CALCIUM 8.6* 8.7* 8.3* 8.2* 7.8*   Liver Function Tests: No results for input(s): AST, ALT, ALKPHOS, BILITOT, PROT, ALBUMIN in the last 168 hours. No results for input(s): LIPASE, AMYLASE in the last 168 hours. CBC:  Recent Labs Lab 01/02/16 0534 01/03/16 0525 01/04/16 0542 01/07/16 0909 01/08/16 0540  WBC 13.9* 20.0* 25.2* 20.8* 19.7*  HGB 11.9* 12.0 12.7 10.8* 10.1*  HCT 34.9* 34.6* 37.3 32.5* 29.9*  MCV 89.7 88.7 88.8 90.8 91.4  PLT 159 179 204 299 330   Cardiac Enzymes:  Recent Labs Lab 01/06/16 0734 01/06/16 1351 01/06/16 2012  TROPONINI 0.08* 0.13* 0.10*     Recent Results (from the past 240 hour(s))  C difficile quick scan w PCR reflex     Status: None   Collection Time: 12/30/15 12:28 AM  Result Value Ref Range Status   C  Diff antigen NEGATIVE NEGATIVE Final   C Diff toxin NEGATIVE NEGATIVE Final   C Diff interpretation Negative for toxigenic C. difficile  Final    Comment: NEGATIVE  Culture, blood (routine x 2)     Status: None   Collection Time: 12/31/15 12:16 PM  Result Value Ref Range Status   Specimen Description BLOOD RIGHT ARM  Final   Special Requests BOTTLES DRAWN AEROBIC AND ANAEROBIC 6CC  Final   Culture  Setup Time   Final    GRAM POSITIVE COCCI IN CLUSTERS IN BOTH AEROBIC AND ANAEROBIC BOTTLES CRITICAL RESULT CALLED TO, READ BACK BY  AND VERIFIED WITH: Carnella Guadalajara, J. AT 0035 ON 01/01/2016 BY AGUNDIZ,E.    Culture   Final    STAPHYLOCOCCUS AUREUS Performed at [facility]    Report Status 01/03/2016 FINAL  Final   Organism ID, Bacteria STAPHYLOCOCCUS AUREUS  Final      Susceptibility   Staphylococcus aureus - MIC*    CIPROFLOXACIN <=0.5 SENSITIVE Sensitive     ERYTHROMYCIN <=0.25 SENSITIVE Sensitive     GENTAMICIN <=0.5 SENSITIVE Sensitive     OXACILLIN <=0.25 SENSITIVE Sensitive     TETRACYCLINE <=1 SENSITIVE Sensitive     VANCOMYCIN <=0.5 SENSITIVE Sensitive     TRIMETH/SULFA <=10 SENSITIVE Sensitive     CLINDAMYCIN <=0.25 SENSITIVE Sensitive     RIFAMPIN <=0.5 SENSITIVE Sensitive     Inducible Clindamycin NEGATIVE Sensitive     * STAPHYLOCOCCUS AUREUS  Culture, blood (routine x 2)     Status: None   Collection Time: 12/31/15 12:33 PM  Result Value Ref Range Status   Specimen Description BLOOD RIGHT ARM  Final   Special Requests BOTTLES DRAWN AEROBIC AND ANAEROBIC 6CC  Final   Culture  Setup Time   Final    GRAM POSITIVE COCCI IN CLUSTERS IN BOTH AEROBIC AND ANAEROBIC BOTTLES CRITICAL RESULT CALLED TO, READ BACK BY AND VERIFIED WITH: LAVINDER,J. AT 1610 ON 01/01/2016 BY AGUNDIZ,E.    Culture   Final    STAPHYLOCOCCUS AUREUS SUSCEPTIBILITIES PERFORMED ON PREVIOUS CULTURE WITHIN THE LAST 5 DAYS. Performed at [facility]    Report Status 01/03/2016 FINAL  Final  Culture, blood (Routine X 2) w Reflex to ID Panel     Status: None   Collection Time: 01/02/16  2:11 PM  Result Value Ref Range Status   Specimen Description BLOOD RIGHT ANTECUBITAL  Final   Special Requests   Final    BOTTLES DRAWN AEROBIC AND ANAEROBIC 6CC EACH  IMMUNE:COMPROMISED   Culture NO GROWTH 6 DAYS  Final   Report Status 01/08/2016 FINAL  Final  Culture, blood (Routine X 2) w Reflex to ID Panel     Status: None   Collection Time: 01/02/16  2:13 PM  Result Value Ref Range Status   Specimen Description BLOOD RIGHT HAND   Final   Special Requests   Final    BOTTLES DRAWN AEROBIC AND ANAEROBIC 6CC  IMMUNE:COMPROMISED   Culture NO GROWTH 6 DAYS  Final   Report Status 01/08/2016 FINAL  Final  Wound culture     Status: None   Collection Time: 01/03/16  6:43 PM  Result Value Ref Range Status   Specimen Description WOUND LEFT FOOT  Final   Special Requests NONE  Final   Gram Stain   Final    NO WBC SEEN NO SQUAMOUS EPITHELIAL CELLS SEEN NO ORGANISMS SEEN Performed at [facility]   Final  MODERATE STAPHYLOCOCCUS AUREUS Note: RIFAMPIN AND GENTAMICIN SHOULD NOT BE USED AS SINGLE DRUGS FOR TREATMENT OF STAPH INFECTIONS. Performed at [facility]    Report Status 01/06/2016 FINAL  Final   Organism ID, Bacteria STAPHYLOCOCCUS AUREUS  Final      Susceptibility   Staphylococcus aureus - MIC*    CLINDAMYCIN <=0.25 SENSITIVE Sensitive     ERYTHROMYCIN <=0.25 SENSITIVE Sensitive     GENTAMICIN <=0.5 SENSITIVE Sensitive     LEVOFLOXACIN <=0.12 SENSITIVE Sensitive     OXACILLIN <=0.25 SENSITIVE Sensitive     RIFAMPIN <=0.5 SENSITIVE Sensitive     TRIMETH/SULFA <=10 SENSITIVE Sensitive     VANCOMYCIN 1 SENSITIVE Sensitive     TETRACYCLINE <=1 SENSITIVE Sensitive     MOXIFLOXACIN <=0.25 SENSITIVE Sensitive     * MODERATE STAPHYLOCOCCUS AUREUS  Culture, blood (routine x 2)     Status: None   Collection Time: 01/05/16  8:06 PM  Result Value Ref Range Status   Specimen Description BLOOD RIGHT ARM  Final   Special Requests IN PEDIATRIC BOTTLE 4CC  Final   Culture  Setup Time   Final    GRAM POSITIVE COCCI IN CLUSTERS AEROBIC BOTTLE ONLY CRITICAL RESULT CALLED TO, READ BACK BY AND VERIFIED WITH: J EDWARDS RN 1556 01/06/16 A BROWNING    Culture STAPHYLOCOCCUS AUREUS  Final   Report Status 01/08/2016 FINAL  Final   Organism ID, Bacteria STAPHYLOCOCCUS AUREUS  Final      Susceptibility   Staphylococcus aureus - MIC*    CIPROFLOXACIN <=0.5 SENSITIVE Sensitive     ERYTHROMYCIN <=0.25  SENSITIVE Sensitive     GENTAMICIN <=0.5 SENSITIVE Sensitive     OXACILLIN 0.5 SENSITIVE Sensitive     TETRACYCLINE <=1 SENSITIVE Sensitive     VANCOMYCIN <=0.5 SENSITIVE Sensitive     TRIMETH/SULFA <=10 SENSITIVE Sensitive     CLINDAMYCIN <=0.25 SENSITIVE Sensitive     RIFAMPIN <=0.5 SENSITIVE Sensitive     Inducible Clindamycin NEGATIVE Sensitive     * STAPHYLOCOCCUS AUREUS  Culture, blood (routine x 2)     Status: None (Preliminary result)   Collection Time: 01/05/16  8:08 PM  Result Value Ref Range Status   Specimen Description BLOOD RIGHT HAND  Final   Special Requests BOTTLES DRAWN AEROBIC ONLY 9CC  Final   Culture NO GROWTH 2 DAYS  Final   Report Status PENDING  Incomplete  Culture, blood (routine x 2)     Status: None (Preliminary result)   Collection Time: 01/06/16  4:55 PM  Result Value Ref Range Status   Specimen Description BLOOD LEFT ANTECUBITAL  Final   Special Requests BOTTLES DRAWN AEROBIC AND ANAEROBIC 10CC  Final   Culture NO GROWTH < 24 HOURS  Final   Report Status PENDING  Incomplete  Culture, blood (routine x 2)     Status: None (Preliminary result)   Collection Time: 01/06/16  4:59 PM  Result Value Ref Range Status   Specimen Description BLOOD BLOOD LEFT HAND  Final   Special Requests BOTTLES DRAWN AEROBIC AND ANAEROBIC 10CC  Final   Culture NO GROWTH < 24 HOURS  Final   Report Status PENDING  Incomplete     Studies: Mr Lodema Pilot Contrast  01-23-2016  CLINICAL DATA:  82 year old female with altered mental status, not responding. Initial encounter. EXAM: MRI HEAD WITHOUT AND WITH CONTRAST TECHNIQUE: Multiplanar, multiecho pulse sequences of the brain and surrounding structures were obtained without and with intravenous contrast. CONTRAST:  16mL MULTIHANCE GADOBENATE  DIMEGLUMINE 529 MG/ML IV SOLN COMPARISON:  Head CT without contrast 06/14/2014. Brain MRI, MRA a 03/13/2012. FINDINGS: About half a dozen punctate foci of restricted diffusion are demonstrated  scattered in the bilateral hemispheres and superior cerebellum. One of these areas is in the medial right occipital lobe PCA territory where there is suggestion of mild underlying cortical encephalomalacia (series 102, image 19). Left MCA, bilateral PCA, and superior cerebellar artery territories are affected. No acute intracranial hemorrhage identified. No midline shift, mass effect, or evidence of intracranial mass lesion. Major intracranial vascular flow voids are stable. Minimal to mild generalized cerebral volume loss since 2013. No ventriculomegaly, extra-axial collection or acute intracranial hemorrhage. Cervicomedullary junction and pituitary are within normal limits. Negative visualized cervical spine. Patchy and scattered bilateral cerebral white matter T2 and FLAIR hyperintensity is stable. No abnormal enhancement identified. Visible internal auditory structures appear normal. Mild chronic right mastoid effusion is unchanged. Nasopharynx remains normal. Trace paranasal sinus mucosal thickening. Chronic postoperative changes to both globes. Negative scalp soft tissues. IMPRESSION: 1. 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 Left MCA, bilateral PCA, and SCA territories are affected. Therefore, consider recent embolic event from the heart or proximal aorta (less likely posterior circulation only thromboembolism). 2. No associated hemorrhage or mass effect. 3. Otherwise no significant change in the MRI appearance of the brain since 2013. Electronically Signed   By: Odessa Fleming M.D.   On: 01/04/2016 09:49   Mr Foot Left Wo Contrast  01/03/2016  CLINICAL DATA:  Bacteremia. Open wounds on the left foot with history of amputation. EXAM: MRI OF THE LEFT FOREFOOT WITHOUT CONTRAST TECHNIQUE: Multiplanar, multisequence MR imaging was performed. No intravenous contrast was administered. COMPARISON:  01/02/2016; 09/15/2014 FINDINGS: The patient refused IV contrast. Despite  efforts by the technologist and patient, severe motion artifact is present on today's exam and could not be eliminated. This reduces exam sensitivity and specificity. First digit amputation at the MTP joint, with a approximately 1.5 by 0.8 by 0.8 cm fluid collection just plantar to the head of the first metatarsal as shown on images 27-32 of series 10, and surrounding abnormal inflammatory effacement of the adipose tissues. Abscess not excluded. Low signal intensity focus on image 33 series 9 possibly a tiny microscopic metallic particle or tiny focus of gas along the amputation margin. As on conventional radiographs, we demonstrate erosive findings in the head of the second and third metatarsals as well as in the bases of the proximal phalanges of the second and third toes. There is some low-level increased T2 signal along the third metatarsal head as on image 27 series 10 Low-level edema tracks along the plantar musculature of the foot. There is some mild subcutaneous edema dorsally in the foot. Degenerative findings along the Lisfranc joint. No Lisfranc joint malalignment identified. IMPRESSION: 1. Erosive findings at the second and third metatarsophalangeal joints are confirmed. This could represent infection, gout, or rheumatoid arthropathy. Particularly in the head of the third metatarsal, osteomyelitis is of greater suspicion. Today's images are severely degraded by motion artifact, and the patient refused IV contrast. 2. Fluid collection plantar to the head of the first metatarsal -abscess not excluded given the inflammatory effacement of the surrounding fatty subcutaneous tissues. Electronically Signed   By: Gaylyn Rong M.D.   On: 01/03/2016 08:41    Scheduled Meds: .  stroke: mapping our early stages of recovery book   Does not apply Once  . amLODipine  10 mg Oral Daily  .  antiseptic oral rinse  7 mL Mouth Rinse BID  . aspirin  81 mg Oral Daily  . brinzolamide  1 drop Both Eyes TID   And   . brimonidine  1 drop Both Eyes TID  . calcium-vitamin D  1 tablet Oral Daily  . famotidine  20 mg Oral Daily  . feeding supplement (ENSURE ENLIVE)  237 mL Oral BID BM  . heparin  5,000 Units Subcutaneous 3 times per day  . isosorbide mononitrate  30 mg Oral Daily  . latanoprost  1 drop Both Eyes QHS  . metoCLOPramide (REGLAN) injection  10 mg Intravenous 4 times per day  . nafcillin IV  2 g Intravenous Q4H  . nystatin  5 mL Oral QID  . pantoprazole  40 mg Oral BID  . sertraline  100 mg Oral Daily  . simvastatin  10 mg Oral QHS   Continuous Infusions: . sodium chloride 50 mL/hr at 01/08/16 1452    Principal Problem:   Sepsis (HCC) Active Problems:   Hypertension   Hyperlipidemia   Gastroenteritis   Glaucoma   Staphylococcus aureus bacteremia   Diminished pulses in lower extremity   Emesis   Cerebral embolism with cerebral infarction   Bacterial endocarditis   Chronic osteomyelitis of left foot (HCC)   Chest tightness   Chest pain   Shortness of breath   Time spent: 35 minutes   Mahala Menghini MD  Resident Physician [facility] 01/08/2016, 3:03 PM  LOS: 7 days

## 2016-01-08 NOTE — Progress Notes (Signed)
Patient Name: Cassandra [name] Date of Encounter: 01/08/2016   SUBJECTIVE  The patient had a chest tightness this morning all over her chest,  7-8/10. Given SL nitro x 3 and IV morphine 2mg  with improvement of pain to 3-4/10. Breathing and movement makes it worse. No radiation of pain. Denies SOB, diaphoresis or nausea. Intermittent similar episode during admission.   CURRENT MEDS .  stroke: mapping our early stages of recovery book   Does not apply Once  . amLODipine  10 mg Oral Daily  . antiseptic oral rinse  7 mL Mouth Rinse BID  . aspirin  81 mg Oral Daily  . brinzolamide  1 drop Both Eyes TID   And  . brimonidine  1 drop Both Eyes TID  . calcium-vitamin D  1 tablet Oral Daily  . famotidine  20 mg Oral Daily  . feeding supplement (ENSURE ENLIVE)  237 mL Oral BID BM  . heparin  5,000 Units Subcutaneous 3 times per day  . latanoprost  1 drop Both Eyes QHS  . metoCLOPramide (REGLAN) injection  10 mg Intravenous 4 times per day  . nafcillin IV  2 g Intravenous Q4H  . nystatin  5 mL Oral QID  . pantoprazole (PROTONIX) IV  40 mg Intravenous Q12H  . sertraline  100 mg Oral Daily  . simvastatin  10 mg Oral QHS    OBJECTIVE  Filed Vitals:   01/08/16 0740 01/08/16 0752 01/08/16 0759 01/08/16 0807  BP: 161/87 155/81 153/83 148/78  Pulse: 98 102 102 105  Temp: 98.1 F (36.7 C)     TempSrc: Oral     Resp:      Height:      Weight:      SpO2: 98%  94% 94%    Intake/Output Summary (Last 24 hours) at 01/08/16 0835 Last data filed at 01/08/16 0701  Gross per 24 hour  Intake    900 ml  Output      0 ml  Net    900 ml   Filed Weights   12/29/15 0759 12/29/15 1957  Weight: 178 lb (80.74 kg) 179 lb 0.2 oz (81.2 kg)    PHYSICAL EXAM  General: chronically ill apperaring NAD. Neuro: Alert and oriented X 3. Moves all extremities spontaneously. Psych: Normal affect. HEENT:  Normal  Neck: Supple without bruits or JVD. Lungs:  Resp regular and unlabored, CTA. TTP to all lung  field. Worsen  Pain with movement.  Heart: RRR no s3, s4, or murmurs. Abdomen: Soft, non-tender, non-distended, BS + x 4.  Extremities: No clubbing, cyanosis or edema. DP 1+ right. R foot dressed.  Accessory Clinical Findings  CBC  Recent Labs  01/07/16 0909 01/08/16 0540  WBC 20.8* 19.7*  HGB 10.8* 10.1*  HCT 32.5* 29.9*  MCV 90.8 91.4  PLT 299 330   Basic Metabolic Panel  Recent Labs  01/07/16 0909 01/08/16 0540  NA 142 144  K 2.9* 3.7  CL 106 111  CO2 22 25  GLUCOSE 115* 125*  BUN 31* 26*  CREATININE 0.96 0.87  CALCIUM 8.2* 7.8*  Cardiac Enzymes  Recent Labs  01/06/16 0734 01/06/16 1351 01/06/16 2012  TROPONINI 0.08* 0.13* 0.10*  Hemoglobin A1C  Recent Labs  01/06/16 1351  HGBA1C 6.0*   Fasting Lipid Panel  Recent Labs  01/06/16 1030  CHOL 132  HDL 19*  LDLCALC 71  TRIG 454*  CHOLHDL 6.9    TELE  Sinus rhythm with rate in 90s, this morning  in 100s. With few 5 beats of SVT.   Radiology/Studies  Ct Angio Head W/cm &/or Wo Cm  01/06/2016  CLINICAL DATA:  Recent posterior fossa and left frontal lobe infarcts. Dull headache. Pain behind the right ear. EXAM: CT ANGIOGRAPHY HEAD AND NECK TECHNIQUE: Multidetector CT imaging of the head and neck was performed using the standard protocol during bolus administration of intravenous contrast. Multiplanar CT image reconstructions and MIPs were obtained to evaluate the vascular anatomy. Carotid stenosis measurements (when applicable) are obtained utilizing NASCET criteria, using the distal internal carotid diameter as the denominator. CONTRAST:  80mL OMNIPAQUE IOHEXOL 350 MG/ML SOLN COMPARISON:  MRI brain 01/04/2016. FINDINGS: CT HEAD Brain: Moderate periventricular and subcortical white matter hypo attenuation is again noted. The recent punctate infarcts are not evident by CT. The ventricles are of normal size. No significant extraaxial fluid collection is present. Calvarium and skull base: Within normal limits  Paranasal sinuses: Clear Orbits: Bilateral lens replacements are present. The globes and orbits are otherwise intact. CTA NECK Aortic arch: A 3 vessel arch configuration is present. Atherosclerotic changes are present in the aortic arch. There is no significant stenosis. Right carotid system: There is mild tortuosity of the cervical right ICA. The right carotid bifurcation is normal. There is proximal tortuosity of the cervical right ICA. There is some irregularity and beading of the mid cervical right ICA without a significant stenosis. Left carotid system: The left common carotid artery is mildly tortuous. There is no significant focal stenosis. There is no significant stenosis at the bifurcation. There is some irregularity and beading of the mid cervical left ICA without significant focal stenosis. Vertebral arteries:The vertebral arteries both originate from the subclavian arteries. Proximal tortuosity is present without significant stenosis. There is no focal stenosis of the vertebral arteries in the neck. Skeleton: Degenerative endplate change and osseous spurring is evident bilaterally at C6-7. There is slight degenerative anterolisthesis at C5-6. No focal lytic or blastic lesions are present. Other neck: No focal mucosal or submucosal lesions are present. The salivary glands are within normal limits. No significant adenopathy is present. There is some pleural thickening posteriorly in both lower lobes. Minimal atelectasis is present. Lungs are otherwise clear. The mediastinum is within normal limits. CTA HEAD Anterior circulation: Atherosclerotic changes are noted within the cavernous internal carotid arteries bilaterally. There is a 50% supraclinoid stenosis bilaterally relative to the terminal ICA. No other significant focal stenosis is present. The right A1 segment is hypoplastic. There is a moderate stenosis of the mid right A1 segment. The left A1 segment is dominant in the anterior communicating artery  is patent. The MCA bifurcations are intact bilaterally. Multiple mild segmental stenoses are present in the MCA and ACA branch vessels bilaterally. Moderate medium and distal small vessel stenoses are present in the MCA a bath branches bilaterally without a significant proximal occlusion. Posterior circulation: The left vertebral artery is slightly dominant to the right. The PICA origins are visualized and normal. The basilar artery is intact. Both posterior cerebral arteries originate from the basilar tip. There is moderate attenuation of distal PCA branch vessels. A high-grade stenosis is present in the proximal inferior right P3 division. Venous sinuses: The dural sinuses are patent. The straight sinus and deep cerebral veins are intact. The cortical veins are within normal limits. Anatomic variants: None Delayed phase: The postcontrast images demonstrate no pathologic enhancement. IMPRESSION: 1. Punctate infarcts involving the left cerebellum and left frontal lobe are not visualized by CT. 2. No new or acute  cortical infarct. 3. Moderate periventricular and subcortical white matter disease is again seen. 4. Atherosclerotic changes at the aortic arch without significant stenosis. 5. Approximately 50% stenosis within the supraclinoid internal carotid artery bilaterally. 6. Moderate medium and small vessel disease in both the anterior and posterior circulation. 7. Near occlusive stenosis in the inferior right P3 division. 8. Mild spondylosis of the cervical spine as described Electronically Signed   By: Marin Roberts M.D.   On: 01/06/2016 11:57   Dg Chest 2 View  12/29/2015  CLINICAL DATA:  RIGHT flank pain beginning yesterday, nausea, vomiting, hypertension EXAM: CHEST  2 VIEW COMPARISON:  05/22/2013 FINDINGS: Borderline enlargement of cardiac silhouette Atherosclerotic calcification aorta. Mediastinal contours stable. Minimal pulmonary vascular congestion. Accentuation of interstitial markings in the  perihilar regions versus previous exam, cannot exclude minimal edema. No gross pleural effusion or pneumothorax. Bones unremarkable. IMPRESSION: Accentuated interstitial markings versus previous exam particularly in perihilar regions question minimal edema. Electronically Signed   By: Ulyses Southward M.D.   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 Dull headache. Pain behind the right ear. EXAM: CT ANGIOGRAPHY HEAD AND NECK TECHNIQUE: Multidetector CT imaging of the head and neck was performed using the standard protocol during bolus administration of intravenous contrast. Multiplanar CT image reconstructions and MIPs were obtained to evaluate the vascular anatomy. Carotid stenosis measurements (when applicable) are obtained utilizing NASCET criteria, using the distal internal carotid diameter as the denominator. CONTRAST:  80mL OMNIPAQUE IOHEXOL 350 MG/ML SOLN COMPARISON:  MRI brain 01/04/2016. FINDINGS: CT HEAD Brain: Moderate periventricular and subcortical white matter hypo attenuation is again noted. The recent punctate infarcts are not evident by CT. The ventricles are of normal size. No significant extraaxial fluid collection is present. Calvarium and skull base: Within normal limits Paranasal sinuses: Clear Orbits: Bilateral lens replacements are present. The globes and orbits are otherwise intact. CTA NECK Aortic arch: A 3 vessel arch configuration is present. Atherosclerotic changes are present in the aortic arch. There is no significant stenosis. Right carotid system: There is mild tortuosity of the cervical right ICA. The right carotid bifurcation is normal. There is proximal tortuosity of the cervical right ICA. There is some irregularity and beading of the mid cervical right ICA without a significant stenosis. Left carotid system: The left common carotid artery is mildly tortuous. There is no significant focal  stenosis. There is no significant stenosis at the bifurcation. There is some irregularity and beading of the mid cervical left ICA without significant focal stenosis. Vertebral arteries:The vertebral arteries both originate from the subclavian arteries. Proximal tortuosity is present without significant stenosis. There is no focal stenosis of the vertebral arteries in the neck. Skeleton: Degenerative endplate change and osseous spurring is evident bilaterally at C6-7. There is slight degenerative anterolisthesis at C5-6. No focal lytic or blastic lesions are present. Other neck: No focal mucosal or submucosal lesions are present. The salivary glands are within normal limits. No significant adenopathy is present. There is some pleural thickening posteriorly in both lower lobes. Minimal atelectasis is present. Lungs are otherwise clear. The mediastinum is within normal limits. CTA HEAD Anterior circulation: Atherosclerotic changes are noted within the cavernous internal carotid arteries bilaterally. There is a 50% supraclinoid stenosis bilaterally relative to the terminal ICA. No other significant focal stenosis is present. The right A1 segment is hypoplastic. There is a moderate stenosis of the mid right A1 segment.  The left A1 segment is dominant in the anterior communicating artery is patent. The MCA bifurcations are intact bilaterally. Multiple mild segmental stenoses are present in the MCA and ACA branch vessels bilaterally. Moderate medium and distal small vessel stenoses are present in the MCA a bath branches bilaterally without a significant proximal occlusion. Posterior circulation: The left vertebral artery is slightly dominant to the right. The PICA origins are visualized and normal. The basilar artery is intact. Both posterior cerebral arteries originate from the basilar tip. There is moderate attenuation of distal PCA branch vessels. A high-grade stenosis is present in the proximal inferior right P3  division. Venous sinuses: The dural sinuses are patent. The straight sinus and deep cerebral veins are intact. The cortical veins are within normal limits. Anatomic variants: None Delayed phase: The postcontrast images demonstrate no pathologic enhancement. IMPRESSION: 1. Punctate infarcts involving the left cerebellum and left frontal lobe are not visualized by CT. 2. No new or acute cortical infarct. 3. Moderate periventricular and subcortical white matter disease is again seen. 4. Atherosclerotic changes at the aortic arch without significant stenosis. 5. Approximately 50% stenosis within the supraclinoid internal carotid artery bilaterally. 6. Moderate medium and small vessel disease in both the anterior and posterior circulation. 7. Near occlusive stenosis in the inferior right P3 division. 8. Mild spondylosis of the cervical spine as described Electronically Signed   By: Marin Roberts M.D.   On: 01/06/2016 11:57   Mr Laqueta Jean NW Contrast  01/04/2016  CLINICAL DATA:  82 year old female with altered mental status, not responding. Initial encounter. EXAM: MRI HEAD WITHOUT AND WITH CONTRAST TECHNIQUE: Multiplanar, multiecho pulse sequences of the brain and surrounding structures were obtained without and with intravenous contrast. CONTRAST:  16mL MULTIHANCE GADOBENATE DIMEGLUMINE 529 MG/ML IV SOLN COMPARISON:  Head CT without contrast 06/14/2014. Brain MRI, MRA a 03/13/2012. FINDINGS: About half a dozen punctate foci of restricted diffusion are demonstrated scattered in the bilateral hemispheres and superior cerebellum. One of these areas is in the medial right occipital lobe PCA territory where there is suggestion of mild underlying cortical encephalomalacia (series 102, image 19). Left MCA, bilateral PCA, and superior cerebellar artery territories are affected. No acute intracranial hemorrhage identified. No midline shift, mass effect, or evidence of intracranial mass lesion. Major intracranial vascular  flow voids are stable. Minimal to mild generalized cerebral volume loss since 2013. No ventriculomegaly, extra-axial collection or acute intracranial hemorrhage. Cervicomedullary junction and pituitary are within normal limits. Negative visualized cervical spine. Patchy and scattered bilateral cerebral white matter T2 and FLAIR hyperintensity is stable. No abnormal enhancement identified. Visible internal auditory structures appear normal. Mild chronic right mastoid effusion is unchanged. Nasopharynx remains normal. Trace paranasal sinus mucosal thickening. Chronic postoperative changes to both globes. Negative scalp soft tissues. IMPRESSION: 1. 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 Left MCA, bilateral PCA, and SCA territories are affected. Therefore, consider recent embolic event from the heart or proximal aorta (less likely posterior circulation only thromboembolism). 2. No associated hemorrhage or mass effect. 3. Otherwise no significant change in the MRI appearance of the brain since 2013. Electronically Signed   By: Odessa Fleming M.D.   On: 01/04/2016 09:49   Ct Abdomen Pelvis W Contrast  12/29/2015  CLINICAL DATA:  Right flank pain since January 25th. Negative renal stone protocol CT earlier today. Additional history of hypertension, hiatal hernia and cholecystectomy. EXAM: CT ABDOMEN AND PELVIS WITH CONTRAST TECHNIQUE: Multidetector CT imaging of the abdomen and pelvis was  performed using the standard protocol following bolus administration of intravenous contrast. CONTRAST:  OMNIPAQUE IOHEXOL 300 MG/ML  SOLN COMPARISON:  Noncontrast abdomen and pelvis CT dated 12/29/2015. Comparison also made to CT abdomen pelvis dated 05/22/2013. FINDINGS: Lower chest: Mild dependent atelectasis at each lung base. Otherwise unremarkable. Hepatobiliary: Liver appears normal. Patient is status post cholecystectomy with associated mild bile duct ectasia. Pancreas: No mass,  inflammatory changes, or other significant abnormality. Spleen: Upper normal in size. Adrenals/Urinary Tract: Adrenal glands are unremarkable. Questionable small stable nodules within each adrenal gland, of benign etiology. Kidneys appear normal without stone or hydronephrosis. No ureteral or bladder calculi identified. Bladder appears normal. Stomach/Bowel: Bowel is normal in caliber. No bowel wall thickening or evidence of bowel wall inflammation. Appendix is normal. Vascular/Lymphatic: Scattered mild atherosclerotic changes of the normal- caliber abdominal aorta. No acute vascular abnormality seen. No enlarged lymph nodes within the abdomen or pelvis. Reproductive: Unremarkable. Small amount of free fluid in the pelvis is likely physiologic in nature. Other: No significant free fluid. No abscess collection. No free intraperitoneal air. Musculoskeletal: Degenerative changes within the lumbar spine, stable in appearance compared to the previous CT of 05/22/2013. No acute- appearing osseous abnormality. 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 Superficial soft tissues are unremarkable. IMPRESSION: 1. No source for right flank pain identified, unless related to the chronic degenerative changes in the lumbar spine. No evidence of acute intra-abdominal or intrapelvic abnormality. 2. Additional incidental/chronic findings detailed above. Electronically Signed   By: Bary Richard M.D.   On: 12/29/2015 17:52   Mr Foot Left Wo Contrast  01/03/2016  CLINICAL DATA:  Bacteremia. Open wounds on the left foot with history of amputation. EXAM: MRI OF THE LEFT FOREFOOT WITHOUT CONTRAST TECHNIQUE: Multiplanar, multisequence MR imaging was performed. No intravenous contrast was administered. COMPARISON:  01/02/2016; 09/15/2014 FINDINGS: The patient refused IV contrast. Despite efforts by the technologist and patient, severe motion artifact is present on today's exam and  could not be eliminated. This reduces exam sensitivity and specificity. First digit amputation at the MTP joint, with a approximately 1.5 by 0.8 by 0.8 cm fluid collection just plantar to the head of the first metatarsal as shown on images 27-32 of series 10, and surrounding abnormal inflammatory effacement of the adipose tissues. Abscess not excluded. Low signal intensity focus on image 33 series 9 possibly a tiny microscopic metallic particle or tiny focus of gas along the amputation margin. As on conventional radiographs, we demonstrate erosive findings in the head of the second and third metatarsals as well as in the bases of the proximal phalanges of the second and third toes. There is some low-level increased T2 signal along the third metatarsal head as on image 27 series 10 Low-level edema tracks along the plantar musculature of the foot. There is some mild subcutaneous edema dorsally in the foot. Degenerative findings along the Lisfranc joint. No Lisfranc joint malalignment identified. IMPRESSION: 1. Erosive findings at the second and third metatarsophalangeal joints are confirmed. This could represent infection, gout, or rheumatoid arthropathy. Particularly in the head of the third metatarsal, osteomyelitis is of greater suspicion. Today's images are severely degraded by motion artifact, and the patient refused IV contrast. 2. Fluid collection plantar to the head of the first metatarsal -abscess not excluded given the inflammatory effacement of the surrounding fatty subcutaneous tissues. Electronically Signed   By: Gaylyn Rong M.D.   On: 01/03/2016 08:41   US Arterial Seg Multiple  12/30/2015  CLINICAL DATA:  82 year old female with left foot cellulitis and constant bilateral lower extremity leg pain for the last 5 days EXAM: NONINVASIVE PHYSIOLOGIC VASCULAR STUDY OF BILATERAL LOWER EXTREMITIES TECHNIQUE: Evaluation of both lower extremities was performed at rest, including calculation of  ankle-brachial indices, multiple segmental pressure evaluation, segmental Doppler and segmental pulse volume recording. COMPARISON:  None. FINDINGS: Right ABI:  1.2 Left ABI:  1.2 Right Lower Extremity: Normal triphasic arterial waveforms at the ankle. Limited utility of segmental blood pressures secondary to poor compressibility of the vessels. Significantly decreased arterial waveforms in the right second through fifth digits. Left Lower Extremity: Normal triphasic arterial waveforms at the ankle. Limited utility of segmental blood pressures secondary to poor compressibility of the vessels. Decreased amplitude of the arterial waveforms in the second through fifth digits. IMPRESSION: 1. Normal bilateral resting ankle-brachial indices and arterial waveforms at the ankles. No evidence of clinically significant large vessel peripheral arterial disease. 2. Abnormal digital waveforms bilaterally consistent with small vessel disease. Signed, Sterling Big, MD [facility] Radiology Electronically Signed   By: Malachy Moan M.D.   On: 12/30/2015 15:38   Dg Chest Port 1 View  01/06/2016  CLINICAL DATA:  Chest pain EXAM: PORTABLE CHEST 1 VIEW COMPARISON:  01/01/2016 FINDINGS: Cardiac shadow is within normal limits. Elevation of the right hemidiaphragm is again noted. Mild rotation to the left is noted accentuating the mediastinal markings. No focal infiltrate is seen. No acute bony abnormality seen. IMPRESSION: No acute abnormality noted. Electronically Signed   By: Alcide Clever M.D.   On: 01/06/2016 09:02   Dg Chest Port 1 View  01/01/2016  CLINICAL DATA:  Shortness of breath, confusion EXAM: PORTABLE CHEST 1 VIEW COMPARISON:  12/29/2015 FINDINGS: Heart is borderline in size. Vascular congestion. Bilateral perihilar and lower lobe opacities are noted, right greater than left. This could represent asymmetric edema or infection. No visible effusions or acute bony  abnormality. IMPRESSION: Borderline heart size. Vascular congestion with bilateral perihilar and lower lobe opacities, right greater than left, asymmetric edema or infection. Electronically Signed   By: Charlett Nose M.D.   On: 01/01/2016 11:45   Dg Foot Complete Left  01/02/2016  CLINICAL DATA:  Open wound of the left foot.  Great toe amputated. EXAM: LEFT FOOT - COMPLETE 3+ VIEW COMPARISON:  09/29/2014 FINDINGS: The great toe has been amputated. The patient has developed what is most likely a chronic dislocation at the second metatarsal phalangeal joint with an erosion of the metatarsal head. There is bone resorption of the distorted distal third metatarsal with deformity consistent with a prior fracture. There is also a new deformity of the distal fourth metatarsal consistent with an old fracture. The bones of the second through fifth toes appear normal. There is no ulceration on the plantar aspect of the ball of the foot. No significant abnormality of the hindfoot or midfoot. IMPRESSION: Chronic changes at the second and third metatarsal phalangeal joints. I doubt that either one of these represents osteomyelitis. Electronically Signed   By: Francene Boyers M.D.   On: 01/02/2016 13:59   Ct Renal Stone Study  12/29/2015  CLINICAL DATA:  Right flank pain starting yesterday. Nausea vomiting. EXAM: CT ABDOMEN AND PELVIS WITHOUT CONTRAST TECHNIQUE: Multidetector CT imaging of the abdomen and pelvis was performed following the standard protocol without IV contrast. COMPARISON:  None. FINDINGS: Lower chest:  Lung bases are clear. Hepatobiliary: Normal liver.  Prior cholecystectomy. Pancreas: Normal. Spleen: Normal. Adrenals/Urinary Tract: Normal  adrenal glands. Normal kidneys. Normal bladder. No urolithiasis or obstructive uropathy. Incidental note is made if a right extrarenal pelvis. Stomach/Bowel: No bowel wall thickening or dilatation. No pneumatosis, pneumoperitoneum or portal venous gas. No abdominal or  pelvic free fluid. Vascular/Lymphatic: Normal caliber abdominal aorta. Mild abdominal aortic atherosclerosis. No lymphadenopathy. Reproductive: Normal uterus.  No adnexal mass. Other: No fluid collection or hematoma. Fat containing right inguinal hernia. Musculoskeletal: No lytic or sclerotic osseous lesion. Degenerative disc disease and facet arthropathy throughout the lumbar spine most severe at L4-5. IMPRESSION: 1. No urolithiasis or obstructive uropathy. Electronically Signed   By: Elige Ko   On: 12/29/2015 08:57    ASSESSMENT AND PLAN  1. MV endocarditis & septic embolic - Seen consult note for recommendation   2. Chest tightness  - Her chest tightness has typical & atypical features. Improved with SL nitro x 3 and IV morphine. However, reproducible with palpations and worsen with government and deep breath. Most consistent  with MSK. Troponin trend was flat few days ago. EKG without acute changes this morning. Could be due to intermittent elevated rate.  - Will benefits from ischemic eval at some point.   3. Sinus arrhythmia - Intermittent sinus tachycardia. Telemetry also showed few small beats (~5) of SVT/sinus tachy. Will review with MD. BP is elevated. Consider adding BB.   4. Prolonged QT - QTC of this morning, increased from 515mg . Follow closely.        Sepsis (HCC) Active Problems:   Hypertension   Hyperlipidemia   Gastroenteritis   Glaucoma   Staphylococcus aureus bacteremia   Diminished pulses in lower extremity   Emesis   Cerebral embolism with cerebral infarction   Bacterial endocarditis   Chronic osteomyelitis of left foot (HCC)   Signed, Antara Brecheisen PA-C

## 2016-01-08 NOTE — Op Note (Signed)
NAMEFERNANDO, [name] NO.:  [id number]  MEDICAL RECORD NO.:  [id number]  LOCATION:  5W12C                        FACILITY:  [facility]  PHYSICIAN:  Toni Arthurs, MD        DATE OF BIRTH:  Jun 07, 1933  DATE OF PROCEDURE:  01/08/2016 DATE OF DISCHARGE:                              OPERATIVE REPORT   PREOPERATIVE DIAGNOSES: 1. Left forefoot osteomyelitis. 2. Left tight heel cord.  POSTOPERATIVE DIAGNOSES: 1. Left forefoot osteomyelitis. 2. Left tight heel cord.  PROCEDURE: 1. Left percutaneous Achilles tendon lengthening through a separate     incision. 2. Left transmetatarsal amputation.  SURGEON:  Toni Arthurs, MD.  ANESTHESIA:  MAC, regional.  ESTIMATED BLOOD LOSS:  Minimal.  TOURNIQUET TIME:  3 minutes at 250 mmHg.  COMPLICATIONS:  None apparent.  DISPOSITION:  Extubated, awake, and stable to recovery.  INDICATIONS FOR PROCEDURE:  The patient is an 82 year old female without significant past medical history.  She was admitted about two weeks ago and found to have bacterial endocarditis.  She has no apparent source for this infection aside from a left forefoot chronic wound.  She had amputation of the hallux over a year ago by Dr. Nolen Mu.  MRI and plain films are suggestive of osteomyelitis of the first, second, and third metatarsals with possible abscess in the plantar soft tissues beneath the first metatarsal head.  In light of her recurrent bacteremia and infected heart valve, eradication of the infection in her foot is of paramount importance.  She presents now for left transmetatarsal amputation in an effort to eradicate all possible sources of infection in preparation for possible heart valve replacement.  She understands the risks and benefits, the alternative treatment options and would like to proceed with surgical treatment.  She specifically understands risks of bleeding, infection, nerve damage, blood clots, need for additional surgery,  continued pain, recurrent or new infection, revision amputation, and death.  PROCEDURE IN DETAIL:  After preoperative consent was obtained and the correct operative site was identified, the patient was brought to the operating room and placed supine on the operating table.  Regional anesthesia had previously been administered by way of popliteal block. IV sedation was then administered.  Left lower extremity was prepped and draped in standard sterile fashion with a tourniquet around the calf. The Achilles tendon was then lengthened in percutaneous fashion with a triple hemisection technique performed with a #15 blade.  The ankle would then dorsiflex to approximately 30 degrees.  Foot was then exsanguinated and tourniquet was inflated to 200 mmHg.  A fishmouth incision was marked on the skin at the midshaft of the metatarsals. Incision was made and sharp dissection was carried down through skin and subcutaneous tissue to the level of the bone.  Subperiosteal dissection was then carried along the metatarsals.  The dorsal flap was protected while an oscillating saw was used to transect all 5 metatarsals bevelling the cuts plantarward.  The plantar soft tissues were then transected and the forefoot was passed off the field.  The tourniquet was released.  Hemostasis was achieved.  The wound was irrigated copiously.  The incision was then closed with simple and horizontal mattress sutures of 2-0 nylon.  Sterile dressings were applied followed by a compression wrap and a well-padded short-leg splint.  The patient was then awakened from anesthesia and transported to the recovery room in stable condition.  FOLLOWUP PLAN:  The patient will be nonweightbearing on the left lower extremity.  She will follow up with me in the office in 2 weeks for suture removal and conversion to a CAM walker boot.     Toni Arthurs, MD     JH/MEDQ  D:  01/08/2016  T:  01/08/2016  Job:  [id number]

## 2016-01-08 NOTE — Progress Notes (Signed)
Patient ID: Cassandra [name], female   DOB: [birth year], 82 y.o.   MRN: [id number]      [address]       [place] [address]             [phone]                 3 Days Post-Op Procedure(s) (LRB): TRANSESOPHAGEAL ECHOCARDIOGRAM (TEE) (N/A)  LOS: 7 days   Subjective: Confused at times but more "withit" today then yesterday. Said she had chest disconfort early this am. Now resolved   Objective: Vital signs in last 24 hours: Patient Vitals for the past 24 hrs:  BP Temp Temp src Pulse Resp SpO2  01/08/16 1218 (!) 141/62 mmHg 97.7 F (36.5 C) Oral (!) 110 - 98 %  01/08/16 1010 (!) 162/73 mmHg - - - - -  01/08/16 0807 (!) 148/78 mmHg - - (!) 105 - 94 %  01/08/16 0759 (!) 153/83 mmHg - - (!) 102 - 94 %  01/08/16 0752 (!) 155/81 mmHg - - (!) 102 - -  01/08/16 0740 (!) 161/87 mmHg 98.1 F (36.7 C) Oral 98 - 98 %  01/08/16 0615 (!) 156/71 mmHg 97.4 F (36.3 C) Oral 96 18 93 %  01/07/16 1431 (!) 162/77 mmHg 97.7 F (36.5 C) Oral (!) 106 18 94 %    Filed Weights   12/29/15 0759 12/29/15 1957  Weight: 178 lb (80.74 kg) 179 lb 0.2 oz (81.2 kg)    Hemodynamic parameters for last 24 hours:    Intake/Output from previous day: 02/04 0701 - 02/05 0700 In: 600 [P.O.:600] Out: -  Intake/Output this shift: Total I/O In: 300 [IV Piggyback:300] Out: -   Scheduled Meds: .  stroke: mapping our early stages of recovery book   Does not apply Once  . amLODipine  10 mg Oral Daily  . antiseptic oral rinse  7 mL Mouth Rinse BID  . aspirin  81 mg Oral Daily  . brinzolamide  1 drop Both Eyes TID   And  . brimonidine  1 drop Both Eyes TID  . calcium-vitamin D  1 tablet Oral Daily  . famotidine  20 mg Oral Daily  . feeding supplement (ENSURE ENLIVE)  237 mL Oral BID BM  . heparin  5,000 Units Subcutaneous 3 times per day  . latanoprost  1 drop Both Eyes QHS  . metoCLOPramide (REGLAN) injection  10 mg Intravenous 4 times per day  . nafcillin IV  2 g Intravenous Q4H  .  nystatin  5 mL Oral QID  . pantoprazole  40 mg Oral BID  . sertraline  100 mg Oral Daily  . simvastatin  10 mg Oral QHS   Continuous Infusions:  PRN Meds:.acetaminophen **OR** acetaminophen, albuterol, gi cocktail, haloperidol lactate, LORazepam, morphine injection, nitroGLYCERIN, ondansetron, polyvinyl alcohol  Exam- unchanged  from yesterday  Lab Results: CBC: Recent Labs  01/07/16 0909 01/08/16 0540  WBC 20.8* 19.7*  HGB 10.8* 10.1*  HCT 32.5* 29.9*  PLT 299 330   BMET:  Recent Labs  01/07/16 0909 01/08/16 0540  NA 142 144  K 2.9* 3.7  CL 106 111  CO2 22 25  GLUCOSE 115* 125*  BUN 31* 26*  CREATININE 0.96 0.87  CALCIUM 8.2* 7.8*    PT/INR: No results for input(s): LABPROT, INR in the last 72 hours. Wound culture     Status: None   Collection Time: 01/03/16  6:43 PM  Result Value Ref Range Status  Specimen Description WOUND LEFT FOOT  Final   Special Requests NONE  Final   Gram Stain   Final    NO WBC SEEN NO SQUAMOUS EPITHELIAL CELLS SEEN NO ORGANISMS SEEN Performed at [facility]    Culture   Final    MODERATE STAPHYLOCOCCUS AUREUS Note: RIFAMPIN AND GENTAMICIN SHOULD NOT BE USED AS SINGLE DRUGS FOR TREATMENT OF STAPH INFECTIONS. Performed at [facility]    Report Status 01/06/2016 FINAL  Final   Organism ID, Bacteria STAPHYLOCOCCUS AUREUS  Final      Susceptibility   Staphylococcus aureus - MIC*    CLINDAMYCIN <=0.25 SENSITIVE Sensitive     ERYTHROMYCIN <=0.25 SENSITIVE Sensitive     GENTAMICIN <=0.5 SENSITIVE Sensitive     LEVOFLOXACIN <=0.12 SENSITIVE Sensitive     OXACILLIN <=0.25 SENSITIVE Sensitive     RIFAMPIN <=0.5 SENSITIVE Sensitive     TRIMETH/SULFA <=10 SENSITIVE Sensitive     VANCOMYCIN 1 SENSITIVE Sensitive     TETRACYCLINE <=1 SENSITIVE Sensitive     MOXIFLOXACIN <=0.25 SENSITIVE Sensitive     * MODERATE STAPHYLOCOCCUS AUREUS  Culture, blood (routine x 2)     Status: None (Preliminary result)   Collection Time:  01/06/16  4:55 PM  Result Value Ref Range Status   Specimen Description BLOOD LEFT ANTECUBITAL  Final   Special Requests BOTTLES DRAWN AEROBIC AND ANAEROBIC 10CC  Final   Culture NO GROWTH < 24 HOURS  Final   Report Status PENDING  Incomplete  Culture, blood (routine x 2)     Status: None (Preliminary result)   Collection Time: 01/06/16  4:59 PM  Result Value Ref Range Status   Specimen Description BLOOD BLOOD LEFT HAND  Final   Special Requests BOTTLES DRAWN AEROBIC AND ANAEROBIC 10CC  Final   Culture NO GROWTH < 24 HOURS  Final   Report Status PENDING  Incomplete    Radiology MRI of foot 1/31//2017 CLINICAL DATA: Bacteremia. Open wounds on the left foot with history of amputation.  EXAM: MRI OF THE LEFT FOREFOOT WITHOUT CONTRAST  TECHNIQUE: Multiplanar, multisequence MR imaging was performed. No intravenous contrast was administered.  COMPARISON: 01/02/2016; 09/15/2014  FINDINGS: The patient refused IV contrast. Despite efforts by the technologist and patient, severe motion artifact is present on today's exam and could not be eliminated. This reduces exam sensitivity and specificity.  First digit amputation at the MTP joint, with a approximately 1.5 by 0.8 by 0.8 cm fluid collection just plantar to the head of the first metatarsal as shown on images 27-32 of series 10, and surrounding abnormal inflammatory effacement of the adipose tissues. Abscess not excluded. Low signal intensity focus on image 33 series 9 possibly a tiny microscopic metallic particle or tiny focus of gas along the amputation margin.  As on conventional radiographs, we demonstrate erosive findings in the head of the second and third metatarsals as well as in the bases of the proximal phalanges of the second and third toes. There is some low-level increased T2 signal along the third metatarsal head as on image 27 series 10  Low-level edema tracks along the plantar musculature of the  foot. There is some mild subcutaneous edema dorsally in the foot.  Degenerative findings along the Lisfranc joint. No Lisfranc joint malalignment identified.  IMPRESSION: 1. Erosive findings at the second and third metatarsophalangeal joints are confirmed. This could represent infection, gout, or rheumatoid arthropathy. Particularly in the head of the third metatarsal, osteomyelitis is of greater suspicion. Today's images  are severely degraded by motion artifact, and the patient refused IV contrast. 2. Fluid collection plantar to the head of the first metatarsal -abscess not excluded given the inflammatory effacement of the surrounding fatty subcutaneous tissues.   Electronically Signed  By: Gaylyn Rong M.D.  On: 01/03/2016 08:41  Assessment/Plan: S/P Procedure(s) (LRB): TRANSESOPHAGEAL ECHOCARDIOGRAM (TEE) (N/A) See previous note yesterday Combination of physical findings , MRI of left foot and positive culture of foot makes left foot chronic infection most likely source of seeding mitral valve. Would favor control of primary source and then  continued antibiotic therpy  for mitral endocarditis and hopefully can clear without valve replacement/repair. Both blood culture from 2/3 no growth so far   Delight Ovens MD 01/08/2016 1:28 PM

## 2016-01-08 NOTE — Anesthesia Preprocedure Evaluation (Addendum)
Anesthesia Evaluation  Patient identified by MRN, date of birth, ID band Patient awake    Reviewed: Allergy & Precautions, H&P , NPO status , Patient's Chart, lab work & pertinent test results, reviewed documented beta blocker date and time   Airway Mallampati: II  TM Distance: >3 FB Neck ROM: Full    Dental no notable dental hx. (+) Upper Dentures, Lower Dentures, Dental Advisory Given   Pulmonary neg pulmonary ROS,    Pulmonary exam normal breath sounds clear to auscultation       Cardiovascular hypertension, Pt. on medications and Pt. on home beta blockers + Peripheral Vascular Disease   Rhythm:Regular Rate:Normal  Bacterial endocarditis   Neuro/Psych Depression CVA    GI/Hepatic negative GI ROS, Neg liver ROS,   Endo/Other  negative endocrine ROS  Renal/GU negative Renal ROS  negative genitourinary   Musculoskeletal  (+) Arthritis , Osteoarthritis,    Abdominal   Peds  Hematology negative hematology ROS (+)   Anesthesia Other Findings   Reproductive/Obstetrics negative OB ROS                          Anesthesia Physical Anesthesia Plan  ASA: III  Anesthesia Plan: Regional and MAC   Post-op Pain Management:    Induction: Intravenous  Airway Management Planned: Simple Face Mask  Additional Equipment:   Intra-op Plan:   Post-operative Plan:   Informed Consent: I have reviewed the patients History and Physical, chart, labs and discussed the procedure including the risks, benefits and alternatives for the proposed anesthesia with the patient or authorized representative who has indicated his/her understanding and acceptance.   Dental advisory given  Plan Discussed with: CRNA  Anesthesia Plan Comments:      Anesthesia Quick Evaluation

## 2016-01-08 NOTE — Brief Op Note (Signed)
12/29/2015 - 01/08/2016  4:43 PM  PATIENT:  Cassandra [name]  82 y.o. female  PRE-OPERATIVE DIAGNOSIS:  Left forefoot osteomyelitis with tight heelcord  POST-OPERATIVE DIAGNOSIS:  same  Procedure(s): 1.  Left percutaneous achilles tendon lengthening (separate incision) 2.  Left transmetatarsal amputation  SURGEON:  Toni Arthurs, MD  ASSISTANT: n/a  ANESTHESIA:   MAC, regional  EBL:  minimal   TOURNIQUET:   Total Tourniquet Time Documented: Calf (Left) - 3 minutes Total: Calf (Left) - 3 minutes  COMPLICATIONS:  None apparent  DISPOSITION:  Extubated, awake and stable to recovery.  DICTATION ID:  [id number]

## 2016-01-08 NOTE — Progress Notes (Signed)
Subjective:  Having 3 large BM (that per staff smelled like "C diff"   Antibiotics:  Anti-infectives    Start     Dose/Rate Route Frequency Ordered Stop   01/04/16 0200  [MAR Hold]  nafcillin 2 g in dextrose 5 % 100 mL IVPB     (MAR Hold since 01/08/16 1554)   2 g 200 mL/hr over 30 Minutes Intravenous Every 4 hours 01/04/16 0151     01/04/16 0045  nafcillin injection 2 g  Status:  Discontinued     2 g Intravenous Every 4 hours 01/04/16 0011 01/04/16 0151   01/01/16 2300  vancomycin (VANCOCIN) IVPB 750 mg/150 ml premix  Status:  Discontinued     750 mg 150 mL/hr over 60 Minutes Intravenous Every 12 hours 01/01/16 1042 01/03/16 1209   01/01/16 1400  ceFAZolin (ANCEF) IVPB 2 g/50 mL premix  Status:  Discontinued     2 g 100 mL/hr over 30 Minutes Intravenous 3 times per day 01/01/16 1041 01/04/16 0011   01/01/16 1200  vancomycin (VANCOCIN) 1,500 mg in sodium chloride 0.9 % 500 mL IVPB     1,500 mg 250 mL/hr over 120 Minutes Intravenous  Once 01/01/16 1041 01/01/16 1512      Medications: Scheduled Meds: . [MAR Hold]  stroke: mapping our early stages of recovery book   Does not apply Once  . [MAR Hold] amLODipine  10 mg Oral Daily  . [MAR Hold] antiseptic oral rinse  7 mL Mouth Rinse BID  . [MAR Hold] aspirin  81 mg Oral Daily  . [MAR Hold] brinzolamide  1 drop Both Eyes TID   And  . [MAR Hold] brimonidine  1 drop Both Eyes TID  . [MAR Hold] calcium-vitamin D  1 tablet Oral Daily  . [MAR Hold] famotidine  20 mg Oral Daily  . [MAR Hold] feeding supplement (ENSURE ENLIVE)  237 mL Oral BID BM  . [MAR Hold] heparin  5,000 Units Subcutaneous 3 times per day  . [MAR Hold] isosorbide mononitrate  30 mg Oral Daily  . [MAR Hold] latanoprost  1 drop Both Eyes QHS  . [MAR Hold] metoCLOPramide (REGLAN) injection  10 mg Intravenous 4 times per day  . [MAR Hold] nafcillin IV  2 g Intravenous Q4H  . [MAR Hold] nystatin  5 mL Oral QID  . [MAR Hold] pantoprazole  40 mg Oral BID  .  [MAR Hold] sertraline  100 mg Oral Daily  . [MAR Hold] simvastatin  10 mg Oral QHS   Continuous Infusions: . sodium chloride 50 mL/hr at 01/08/16 1452   PRN Meds:.0.9 % irrigation (POUR BTL), [MAR Hold] acetaminophen **OR** [MAR Hold] acetaminophen, [MAR Hold] albuterol, [MAR Hold] gi cocktail, [MAR Hold] haloperidol lactate, [MAR Hold] LORazepam, [MAR Hold]  morphine injection, [MAR Hold] nitroGLYCERIN, [MAR Hold] ondansetron, [MAR Hold] polyvinyl alcohol    Objective: Weight change:   Intake/Output Summary (Last 24 hours) at 01/08/16 1624 Last data filed at 01/08/16 1606  Gross per 24 hour  Intake    840 ml  Output      0 ml  Net    840 ml   Blood pressure 141/62, pulse 110, temperature 97.7 F (36.5 C), temperature source Oral, resp. rate 18, height 5' 9.5" (1.765 m), weight 179 lb 0.2 oz (81.2 kg), SpO2 98 %. Temp:  [97.4 F (36.3 C)-98.1 F (36.7 C)] 97.7 F (36.5 C) (02/05 1218) Pulse Rate:  [96-110] 110 (02/05 1218) Resp:  [18] 18 (  02/05 0759) BP: (141-162)/(62-87) 141/62 mmHg (02/05 1218) SpO2:  [93 %-98 %] 98 % (02/05 1218)  Physical Exam: General: Alert and awake, oriented  HEENT:  EOMI CV: rrr,  GI: soft nd, nt Left foot wound examined with ulceration no frank pus Neuro: nonfocal  CBC:  CBC Latest Ref Rng 01/08/2016 01/07/2016 01/04/2016  WBC 4.0 - 10.5 K/uL 19.7(H) 20.8(H) 25.2(H)  Hemoglobin 12.0 - 15.0 g/dL 10.1(L) 10.8(L) 12.7  Hematocrit 36.0 - 46.0 % 29.9(L) 32.5(L) 37.3  Platelets 150 - 400 K/uL 330 299 204      BMET  Recent Labs  01/07/16 0909 01/08/16 0540  NA 142 144  K 2.9* 3.7  CL 106 111  CO2 22 25  GLUCOSE 115* 125*  BUN 31* 26*  CREATININE 0.96 0.87  CALCIUM 8.2* 7.8*     Liver Panel  No results for input(s): PROT, ALBUMIN, AST, ALT, ALKPHOS, BILITOT, BILIDIR, IBILI in the last 72 hours.     Sedimentation Rate No results for input(s): ESRSEDRATE in the last 72 hours. C-Reactive Protein No results for input(s): CRP in the  last 72 hours.  Micro Results: Recent Results (from the past 720 hour(s))  C difficile quick scan w PCR reflex     Status: None   Collection Time: 12/30/15 12:28 AM  Result Value Ref Range Status   C Diff antigen NEGATIVE NEGATIVE Final   C Diff toxin NEGATIVE NEGATIVE Final   C Diff interpretation Negative for toxigenic C. difficile  Final    Comment: NEGATIVE  Culture, blood (routine x 2)     Status: None   Collection Time: 12/31/15 12:16 PM  Result Value Ref Range Status   Specimen Description BLOOD RIGHT ARM  Final   Special Requests BOTTLES DRAWN AEROBIC AND ANAEROBIC 6CC  Final   Culture  Setup Time   Final    GRAM POSITIVE COCCI IN CLUSTERS IN BOTH AEROBIC AND ANAEROBIC BOTTLES CRITICAL RESULT CALLED TO, READ BACK BY AND VERIFIED WITH: Carnella Guadalajara, J. AT 0035 ON 01/01/2016 BY AGUNDIZ,E.    Culture   Final    STAPHYLOCOCCUS AUREUS Performed at [facility]    Report Status 01/03/2016 FINAL  Final   Organism ID, Bacteria STAPHYLOCOCCUS AUREUS  Final      Susceptibility   Staphylococcus aureus - MIC*    CIPROFLOXACIN <=0.5 SENSITIVE Sensitive     ERYTHROMYCIN <=0.25 SENSITIVE Sensitive     GENTAMICIN <=0.5 SENSITIVE Sensitive     OXACILLIN <=0.25 SENSITIVE Sensitive     TETRACYCLINE <=1 SENSITIVE Sensitive     VANCOMYCIN <=0.5 SENSITIVE Sensitive     TRIMETH/SULFA <=10 SENSITIVE Sensitive     CLINDAMYCIN <=0.25 SENSITIVE Sensitive     RIFAMPIN <=0.5 SENSITIVE Sensitive     Inducible Clindamycin NEGATIVE Sensitive     * STAPHYLOCOCCUS AUREUS  Culture, blood (routine x 2)     Status: None   Collection Time: 12/31/15 12:33 PM  Result Value Ref Range Status   Specimen Description BLOOD RIGHT ARM  Final   Special Requests BOTTLES DRAWN AEROBIC AND ANAEROBIC 6CC  Final   Culture  Setup Time   Final    GRAM POSITIVE COCCI IN CLUSTERS IN BOTH AEROBIC AND ANAEROBIC BOTTLES CRITICAL RESULT CALLED TO, READ BACK BY AND VERIFIED WITH: LAVINDER,J. AT 1610 ON 01/01/2016 BY  AGUNDIZ,E.    Culture   Final    STAPHYLOCOCCUS AUREUS SUSCEPTIBILITIES PERFORMED ON PREVIOUS CULTURE WITHIN THE LAST 5 DAYS. Performed at [facility]    Report  Status 01/03/2016 FINAL  Final  Culture, blood (Routine X 2) w Reflex to ID Panel     Status: None   Collection Time: 01/02/16  2:11 PM  Result Value Ref Range Status   Specimen Description BLOOD RIGHT ANTECUBITAL  Final   Special Requests   Final    BOTTLES DRAWN AEROBIC AND ANAEROBIC 6CC EACH  IMMUNE:COMPROMISED   Culture NO GROWTH 6 DAYS  Final   Report Status 01/08/2016 FINAL  Final  Culture, blood (Routine X 2) w Reflex to ID Panel     Status: None   Collection Time: 01/02/16  2:13 PM  Result Value Ref Range Status   Specimen Description BLOOD RIGHT HAND  Final   Special Requests   Final    BOTTLES DRAWN AEROBIC AND ANAEROBIC 6CC  IMMUNE:COMPROMISED   Culture NO GROWTH 6 DAYS  Final   Report Status 01/08/2016 FINAL  Final  Wound culture     Status: None   Collection Time: 01/03/16  6:43 PM  Result Value Ref Range Status   Specimen Description WOUND LEFT FOOT  Final   Special Requests NONE  Final   Gram Stain   Final    NO WBC SEEN NO SQUAMOUS EPITHELIAL CELLS SEEN NO ORGANISMS SEEN Performed at [facility]    Culture   Final    MODERATE STAPHYLOCOCCUS AUREUS Note: RIFAMPIN AND GENTAMICIN SHOULD NOT BE USED AS SINGLE DRUGS FOR TREATMENT OF STAPH INFECTIONS. Performed at [facility]    Report Status 01/06/2016 FINAL  Final   Organism ID, Bacteria STAPHYLOCOCCUS AUREUS  Final      Susceptibility   Staphylococcus aureus - MIC*    CLINDAMYCIN <=0.25 SENSITIVE Sensitive     ERYTHROMYCIN <=0.25 SENSITIVE Sensitive     GENTAMICIN <=0.5 SENSITIVE Sensitive     LEVOFLOXACIN <=0.12 SENSITIVE Sensitive     OXACILLIN <=0.25 SENSITIVE Sensitive     RIFAMPIN <=0.5 SENSITIVE Sensitive     TRIMETH/SULFA <=10 SENSITIVE Sensitive     VANCOMYCIN 1 SENSITIVE Sensitive     TETRACYCLINE <=1  SENSITIVE Sensitive     MOXIFLOXACIN <=0.25 SENSITIVE Sensitive     * MODERATE STAPHYLOCOCCUS AUREUS  Culture, blood (routine x 2)     Status: None   Collection Time: 01/05/16  8:06 PM  Result Value Ref Range Status   Specimen Description BLOOD RIGHT ARM  Final   Special Requests IN PEDIATRIC BOTTLE 4CC  Final   Culture  Setup Time   Final    GRAM POSITIVE COCCI IN CLUSTERS AEROBIC BOTTLE ONLY CRITICAL RESULT CALLED TO, READ BACK BY AND VERIFIED WITH: J EDWARDS RN 1556 01/06/16 A BROWNING    Culture STAPHYLOCOCCUS AUREUS  Final   Report Status 01/08/2016 FINAL  Final   Organism ID, Bacteria STAPHYLOCOCCUS AUREUS  Final      Susceptibility   Staphylococcus aureus - MIC*    CIPROFLOXACIN <=0.5 SENSITIVE Sensitive     ERYTHROMYCIN <=0.25 SENSITIVE Sensitive     GENTAMICIN <=0.5 SENSITIVE Sensitive     OXACILLIN 0.5 SENSITIVE Sensitive     TETRACYCLINE <=1 SENSITIVE Sensitive     VANCOMYCIN <=0.5 SENSITIVE Sensitive     TRIMETH/SULFA <=10 SENSITIVE Sensitive     CLINDAMYCIN <=0.25 SENSITIVE Sensitive     RIFAMPIN <=0.5 SENSITIVE Sensitive     Inducible Clindamycin NEGATIVE Sensitive     * STAPHYLOCOCCUS AUREUS  Culture, blood (routine x 2)     Status: None (Preliminary result)   Collection Time: 01/05/16  8:08 PM  Result Value Ref Range Status   Specimen Description BLOOD RIGHT HAND  Final   Special Requests BOTTLES DRAWN AEROBIC ONLY 9CC  Final   Culture NO GROWTH 2 DAYS  Final   Report Status PENDING  Incomplete  Culture, blood (routine x 2)     Status: None (Preliminary result)   Collection Time: 01/06/16  4:55 PM  Result Value Ref Range Status   Specimen Description BLOOD LEFT ANTECUBITAL  Final   Special Requests BOTTLES DRAWN AEROBIC AND ANAEROBIC 10CC  Final   Culture NO GROWTH < 24 HOURS  Final   Report Status PENDING  Incomplete  Culture, blood (routine x 2)     Status: None (Preliminary result)   Collection Time: 01/06/16  4:59 PM  Result Value Ref Range Status    Specimen Description BLOOD BLOOD LEFT HAND  Final   Special Requests BOTTLES DRAWN AEROBIC AND ANAEROBIC 10CC  Final   Culture NO GROWTH < 24 HOURS  Final   Report Status PENDING  Incomplete    Studies/Results: No results found.    Assessment/Plan:  INTERVAL HISTORY:  01/05/06   Principal Problem:   Sepsis (HCC) Active Problems:   Hypertension   Hyperlipidemia   Gastroenteritis   Glaucoma   Staphylococcus aureus bacteremia   Diminished pulses in lower extremity   Emesis   Cerebral embolism with cerebral infarction   Bacterial endocarditis   Chronic osteomyelitis of left foot (HCC)   Chest tightness   Chest pain   Shortness of breath    Cassandra [name] is a 82 y.o. female with  Native valve endocarditis with CNS emboli, with source of her infection likely being chronic osteomyelitis that persisted after amputation of big toe in 09/2014. Her vegetation is very long but not wide diameter. The greatest concern is that her MSSA bacteremia which was diagnosed on blood culture from 12/31/15 and had appeared to cleared from her blood by 01/02/2016 now appears to occurred again on blood cultures drawn on 01/05/2016.  #1 Methicillin sensitive staph coccus aureus native valve endocarditis with large lengthwise vegetation with CNS emboli and osteomyelitis of her  left foot, now with recurrent bacteremia despite being on appropriate antibiotic therapy. She also had embolic events while on appropriate antibiotics  Dr. Victorino Dike is currently taking the patient to the OR to control infection in the foot  Indications for  heart valve replacement in this patient would include septic embolization while on effective antimicrobial therapy.  In speaking with the grand daughter the patient had an ACUTE event on Wednesday several days into antibiotic therapy with right sided hemiplegia and confusion which lead to MRI that showed emboli making me think she had emboli on effective therapy.    Fortunately she has not had any evidence of further emboli  The other reason in this pt would be persistently + blood cultures  Her blood cultures from 01/06/16 are without growth so far which is also encouraging  --continue Nafcillin for CNS penetration  --Greatly appreciate cardiology cardiothoracic surgery orthopedic surgery, Neurology  and the primary team following the patient very closely.  #2 Loose stools and possible: C diff is NEGATIVE  I spent greater than 40 minutes with the patient including greater than 50% of time in face to face counsel of the patient and in coordination of her care with Dr. Tyrone Sage, Dr. Vanessa Barbara and today extensive discussion with Dr Victorino Dike today  Dr. Drue Second is back tomorrow.    LOS: 7 days   Cassandra [name]  01/08/2016, 4:24 PM

## 2016-01-08 NOTE — Anesthesia Procedure Notes (Addendum)
Anesthesia Regional Block:  Popliteal block  Pre-Anesthetic Checklist: ,, timeout performed, Correct Patient, Correct Site, Correct Laterality, Correct Procedure, Correct Position, site marked, Risks and benefits discussed, pre-op evaluation, post-op pain management  Laterality: Left  Prep: Maximum Sterile Barrier Precautions used and chloraprep       Needles:  Injection technique: Single-shot  Needle Type: Echogenic Stimulator Needle     Needle Length: 9cm 9 cm Needle Gauge: 21 and 21 G    Additional Needles:  Procedures: ultrasound guided (picture in chart) and nerve stimulator Popliteal block  Nerve Stimulator or Paresthesia:  Response: Peroneal,  Response: Tibial,   Additional Responses:   Narrative:  Start time: 01/08/2016 3:30 PM End time: 01/08/2016 3:40 PM Injection made incrementally with aspirations every 5 mL. Anesthesiologist: Gaynelle Adu  Additional Notes: 2% Lidocaine skin wheel.

## 2016-01-08 NOTE — Transfer of Care (Signed)
Immediate Anesthesia Transfer of Care Note  Patient: Cassandra [name]  Procedure(s) Performed: Procedure(s): AMPUTATION TRANSMETATARSAL LEFT FOOT and achilles tendon lengthening (Left)  Patient Location: PACU  Anesthesia Type:MAC  Level of Consciousness: awake  Airway & Oxygen Therapy: Patient Spontanous Breathing  Post-op Assessment: Report given to RN and Post -op Vital signs reviewed and stable  Post vital signs: Reviewed and stable  Last Vitals:  Filed Vitals:   01/08/16 1010 01/08/16 1218  BP: 162/73 141/62  Pulse:  110  Temp:  36.5 C  Resp:      Complications: No apparent anesthesia complications

## 2016-01-08 NOTE — Anesthesia Postprocedure Evaluation (Signed)
Anesthesia Post Note  Patient: Cassandra [name]  Procedure(s) Performed: Procedure(s) (LRB): AMPUTATION TRANSMETATARSAL LEFT FOOT and achilles tendon lengthening (Left)  Patient location during evaluation: PACU Anesthesia Type: MAC and Regional Level of consciousness: awake and alert Pain management: pain level controlled Vital Signs Assessment: post-procedure vital signs reviewed and stable Respiratory status: spontaneous breathing, nonlabored ventilation and respiratory function stable Cardiovascular status: stable and blood pressure returned to baseline Anesthetic complications: no    Last Vitals:  Filed Vitals:   01/08/16 1218 01/08/16 1638  BP: 141/62 146/67  Pulse: 110 101  Temp: 36.5 C 36.6 C  Resp:  17    Last Pain:  Filed Vitals:   01/08/16 1645  PainSc: 0-No pain                 Isaias Dowson,W. EDMOND

## 2016-01-08 NOTE — Consult Note (Signed)
WOC wound consult note Reason for Consult: foot ulcer Patient consult received, but it is noted that Dr. Victorino Dike (Ortho) has been in and after careful consideration of the patient's overall POC (including cardiac) and discussion with the family is proceeding with a trans metatarsal head amputation today.  There is no role for WOC nursing at this time. WOC nursing team will not follow, but will remain available to this patient, the nursing and medical teams.  Please re-consult if needed. Thanks, Ladona Mow, MSN, RN, GNP, [facility]  Pager# [phone]

## 2016-01-08 NOTE — Consult Note (Signed)
Reason for Consult:  Left foot infection Referring Physician: Dr. Recardo Evangelist is an 82 y.o. female.  HPI:  82 y/o female with PMH of peripheral neuropathy and left hallux amputation was admitted two weeks ago for nausea, vomiting and lethargy.  She said she began feeling poorly just before that time.  She was admitted and trasferred to [facility].  She was found to have sepsis and bacterial endocarditis.  Her course has been complicated by septic emboli to her brain as well as recurrent bacteremia with positive blood cultures despite IV abx.  She underwent amputation of her left hallux over a year ago by Dr. Caprice Beaver.  She had some delay of healing but denies any open wounds or drainage in recent months.  She denies any pain in her foot.  She has no h/o diabetes and is not a smoker.  Her daughter and other family are with her.  She reports that until this hospitalization she was ambulatory in the community without assistive device and still drove to the grocery store and beauty parlor.  I'm asked to evaluate her foot as a source of infection and consider additional surgical treatment to eradicate any residual infection.  Past Medical History  Diagnosis Date  . Hypertension   . Arthritis   . Depression   . Hyperlipidemia   . PFO (patent foramen ovale)     small  . H/O echocardiogram 2008    prolapse of post. mitral leaflet, tr MR, tr TR, EF >55%, mild concentric LVH  . H/O cardiovascular stress test 10/13/2007    low risk scan, no changes from previous test  . Heart palpitations     controlled on BB  . Hiatal hernia   . Cellulitis and abscess of toe of left foot 09/16/2014    Past Surgical History  Procedure Laterality Date  . Cholecystectomy    . Cardiac catheterization  10/2002    normal coronary arteries, normal LV function  . Incision and drainage Right 07/21/2014    Procedure: INCISION AND DRAINAGE;  Surgeon: Marcheta Grammes, DPM;  Location: [facility];  Service:  Podiatry;  Laterality: Right;  . Incision and drainage Left 09/17/2014    Procedure: INCISION AND DRAINAGE 1ST TOE LEFT FOOT;  Surgeon: Marcheta Grammes, DPM;  Location: [facility];  Service: Podiatry;  Laterality: Left;  . Bone biopsy Left 09/17/2014    Procedure: BONE BIOPSY;  Surgeon: Marcheta Grammes, DPM;  Location: [facility];  Service: Podiatry;  Laterality: Left;  . Amputation Left 09/20/2014    Procedure: AMPUTATION DIGIT (LEFT GREAT TOE);  Surgeon: Marcheta Grammes, DPM;  Location: [facility];  Service: Podiatry;  Laterality: Left;  . Tee without cardioversion N/A 01/05/2016    Procedure: TRANSESOPHAGEAL ECHOCARDIOGRAM (TEE);  Surgeon: Sanda Klein, MD;  Location[facility] ENDOSCOPY;  Service: Cardiovascular;  Laterality: N/A;    Family History  Problem Relation Age of Onset  . Heart disease Mother   . Cancer Father   . Kidney disease Brother   . Hypertension Daughter   . Hyperlipidemia Son     Social History:  reports that she has never smoked. She has never used smokeless tobacco. She reports that she does not drink alcohol or use illicit drugs.  Allergies:  Allergies  Allergen Reactions  . Codeine Shortness Of Breath and Nausea And Vomiting  . Hydrocodone Shortness Of Breath and Nausea And Vomiting    Medications: I have reviewed the patient's current medications.  Results for orders placed  or performed during the hospital encounter of 12/29/15 (from the past 48 hour(s))  Troponin I (q 6hr x 3)     Status: Abnormal   Collection Time: 01/06/16  1:51 PM  Result Value Ref Range   Troponin I 0.13 (H) <0.031 ng/mL    Comment:        PERSISTENTLY INCREASED TROPONIN VALUES IN THE RANGE OF 0.04-0.49 ng/mL CAN BE SEEN IN:       -UNSTABLE ANGINA       -CONGESTIVE HEART FAILURE       -MYOCARDITIS       -CHEST TRAUMA       -ARRYHTHMIAS       -LATE PRESENTING MYOCARDIAL INFARCTION       -COPD   CLINICAL FOLLOW-UP RECOMMENDED.   Hemoglobin A1c     Status: Abnormal    Collection Time: 01/06/16  1:51 PM  Result Value Ref Range   Hgb A1c MFr Bld 6.0 (H) 4.8 - 5.6 %    Comment: (NOTE)         Pre-diabetes: 5.7 - 6.4         Diabetes: >6.4         Glycemic control for adults with diabetes: <7.0    Mean Plasma Glucose 126 mg/dL    Comment: (NOTE) Performed At: [facility] [place], [place] [id number] Lindon Romp MD VJ:[id number]   Culture, blood (routine x 2)     Status: None (Preliminary result)   Collection Time: 01/06/16  4:55 PM  Result Value Ref Range   Specimen Description BLOOD LEFT ANTECUBITAL    Special Requests BOTTLES DRAWN AEROBIC AND ANAEROBIC 10CC    Culture NO GROWTH < 24 HOURS    Report Status PENDING   Culture, blood (routine x 2)     Status: None (Preliminary result)   Collection Time: 01/06/16  4:59 PM  Result Value Ref Range   Specimen Description BLOOD BLOOD LEFT HAND    Special Requests BOTTLES DRAWN AEROBIC AND ANAEROBIC 10CC    Culture NO GROWTH < 24 HOURS    Report Status PENDING   Troponin I (q 6hr x 3)     Status: Abnormal   Collection Time: 01/06/16  8:12 PM  Result Value Ref Range   Troponin I 0.10 (H) <0.031 ng/mL    Comment:        PERSISTENTLY INCREASED TROPONIN VALUES IN THE RANGE OF 0.04-0.49 ng/mL CAN BE SEEN IN:       -UNSTABLE ANGINA       -CONGESTIVE HEART FAILURE       -MYOCARDITIS       -CHEST TRAUMA       -ARRYHTHMIAS       -LATE PRESENTING MYOCARDIAL INFARCTION       -COPD   CLINICAL FOLLOW-UP RECOMMENDED.   CBC     Status: Abnormal   Collection Time: 01/07/16  9:09 AM  Result Value Ref Range   WBC 20.8 (H) 4.0 - 10.5 K/uL   RBC 3.58 (L) 3.87 - 5.11 MIL/uL   Hemoglobin 10.8 (L) 12.0 - 15.0 g/dL   HCT 32.5 (L) 36.0 - 46.0 %   MCV 90.8 78.0 - 100.0 fL   MCH 30.2 26.0 - 34.0 pg   MCHC 33.2 30.0 - 36.0 g/dL   RDW 14.7 11.5 - 15.5 %   Platelets 299 150 - 400 K/uL  Basic metabolic panel     Status: Abnormal   Collection Time: 01/07/16  9:09 AM  Result Value Ref  Range   Sodium 142 135 - 145 mmol/L   Potassium 2.9 (L) 3.5 - 5.1 mmol/L   Chloride 106 101 - 111 mmol/L   CO2 22 22 - 32 mmol/L   Glucose, Bld 115 (H) 65 - 99 mg/dL   BUN 31 (H) 6 - 20 mg/dL   Creatinine, Ser 0.96 0.44 - 1.00 mg/dL   Calcium 8.2 (L) 8.9 - 10.3 mg/dL   GFR calc non Af Amer 54 (L) >60 mL/min   GFR calc Af Amer >60 >60 mL/min    Comment: (NOTE) The eGFR has been calculated using the CKD EPI equation. This calculation has not been validated in all clinical situations. eGFR's persistently <60 mL/min signify possible Chronic Kidney Disease.    Anion gap 14 5 - 15  Basic metabolic panel     Status: Abnormal   Collection Time: 01/08/16  5:40 AM  Result Value Ref Range   Sodium 144 135 - 145 mmol/L   Potassium 3.7 3.5 - 5.1 mmol/L   Chloride 111 101 - 111 mmol/L   CO2 25 22 - 32 mmol/L   Glucose, Bld 125 (H) 65 - 99 mg/dL   BUN 26 (H) 6 - 20 mg/dL   Creatinine, Ser 0.87 0.44 - 1.00 mg/dL   Calcium 7.8 (L) 8.9 - 10.3 mg/dL   GFR calc non Af Amer >60 >60 mL/min   GFR calc Af Amer >60 >60 mL/min    Comment: (NOTE) The eGFR has been calculated using the CKD EPI equation. This calculation has not been validated in all clinical situations. eGFR's persistently <60 mL/min signify possible Chronic Kidney Disease.    Anion gap 8 5 - 15  CBC     Status: Abnormal   Collection Time: 01/08/16  5:40 AM  Result Value Ref Range   WBC 19.7 (H) 4.0 - 10.5 K/uL   RBC 3.27 (L) 3.87 - 5.11 MIL/uL   Hemoglobin 10.1 (L) 12.0 - 15.0 g/dL   HCT 29.9 (L) 36.0 - 46.0 %   MCV 91.4 78.0 - 100.0 fL   MCH 30.9 26.0 - 34.0 pg   MCHC 33.8 30.0 - 36.0 g/dL   RDW 14.9 11.5 - 15.5 %   Platelets 330 150 - 400 K/uL    No results found.  ROS:  + blood cultures.  No f/c/n/v.  No pain in the left foot.  No drainage or swelling of the left foot. PE:  Blood pressure 162/73, pulse 105, temperature 98.1 F (36.7 C), temperature source Oral, resp. rate 18, height 5' 9.5" (1.765 m), weight 81.2 kg  (179 lb 0.2 oz), SpO2 94 %.  wn wd elderly woman in nad.  A and O x 4.  Mood and affect normal.  EOMI.  resp unlabored.  L forefoot with healed surgical incision from hallux amputation.  No erythema, swelling, tenderness or warmth.  No fluctuance.  2nd hammertoe.  No erythema at 2nd or 3rd toes.  No ulcers.  5/5 strength in PF and DF of the ankle.  Sens to LT diminshed at the forefoot.  No lymphadenopathy.  1+ dp pulse.  Radiology:  L foot films show no lytic lesions of the 1st MT.  2nd and 3rd MTP joints show erosive changes that appear more c/w that degenerative changes or gout than osteomyelitis.  MRI:  Increased signal at 2nd and 3rd MTP joint that could represent osteomyelitis.  Possible abscess at 1st MT plantar soft tissues  Assessment/Plan: L forefoot possible chronic  osteomyelitis - at this point the patient's foot exam and imaging studies are unimpressive.  However I can't rule the foot out as a possible source of her bacterial endocarditis. I don't believe the foot is a likely source of her most recent positive blood culture.  If we assume that the foot is the source of her infection, then definitive treatment would require at least transmet amputation and heelcord lengthening.  I explained the diagnosis, treatment options and their risks and benefits to the patient and her family in detail.  The risks and benefits of the alternative treatment options have been discussed in detail.  The patient wishes to proceed with surgery and specifically understands risks of bleeding, infection, nerve damage, blood clots, need for additional surgery, amputation and death.   Wylene Simmer January 28, 2016, 12:06 PM

## 2016-01-09 ENCOUNTER — Inpatient Hospital Stay (HOSPITAL_COMMUNITY): Payer: Medicare Other

## 2016-01-09 ENCOUNTER — Encounter (HOSPITAL_COMMUNITY): Payer: Self-pay | Admitting: Radiology

## 2016-01-09 DIAGNOSIS — I63419 Cerebral infarction due to embolism of unspecified middle cerebral artery: Secondary | ICD-10-CM

## 2016-01-09 LAB — BASIC METABOLIC PANEL
ANION GAP: 9 (ref 5–15)
BUN: 22 mg/dL — ABNORMAL HIGH (ref 6–20)
CHLORIDE: 106 mmol/L (ref 101–111)
CO2: 24 mmol/L (ref 22–32)
Calcium: 7.9 mg/dL — ABNORMAL LOW (ref 8.9–10.3)
Creatinine, Ser: 0.98 mg/dL (ref 0.44–1.00)
GFR calc Af Amer: >60 mL/min (ref >60)
GFR, EST NON AFRICAN AMERICAN: 53 mL/min — AB (ref >60)
GLUCOSE: 151 mg/dL — AB (ref 65–99)
POTASSIUM: 3.2 mmol/L — AB (ref 3.5–5.1)
Sodium: 139 mmol/L (ref 135–145)

## 2016-01-09 LAB — CBC
HEMATOCRIT: 28.7 % — AB (ref 36.0–46.0)
Hemoglobin: 9.5 g/dL — ABNORMAL LOW (ref 12.0–15.0)
MCH: 30.4 pg (ref 26.0–34.0)
MCHC: 33.1 g/dL (ref 30.0–36.0)
MCV: 92 fL (ref 78.0–100.0)
PLATELETS: 375 10*3/uL (ref 150–400)
RBC: 3.12 MIL/uL — ABNORMAL LOW (ref 3.87–5.11)
RDW: 15.1 % (ref 11.5–15.5)
WBC: 25.1 10*3/uL — ABNORMAL HIGH (ref 4.0–10.5)

## 2016-01-09 MED ORDER — IOHEXOL 300 MG/ML  SOLN: 25.0000 mL | Freq: Once | INTRAMUSCULAR | Status: DC | PRN

## 2016-01-09 MED ORDER — IOHEXOL 300 MG/ML  SOLN
100.0000 mL | Freq: Once | INTRAMUSCULAR | Status: AC | PRN
  Administered 2016-01-09: 100 mL via INTRAVENOUS

## 2016-01-09 MED ORDER — POTASSIUM CHLORIDE CRYS ER 20 MEQ PO TBCR
40.0000 meq | EXTENDED_RELEASE_TABLET | Freq: Four times a day (QID) | ORAL | Status: AC
  Administered 2016-01-09 (×2): 40 meq via ORAL
  Filled 2016-01-09 (×2): qty 2

## 2016-01-09 NOTE — Progress Notes (Signed)
Occupational Therapy Treatment Patient Details Name: Cassandra [name] MRN: [id number] DOB: 11-Nov-1933 Today's Date: 01/09/2016    History of present illness 82yo white female who presents to [facility] on 12/29/15 after 2 days of N/V, R flank pain, and generalized weakness. Pt admitted for Gastroenteritis after workup. At baseline, pt reports she is a fully indep community ambulator without AD. PMH: HTN, HLD, and L hallux amputation 1ya. During past few days patient has been increasingly lethargic and somnolent. MRI findings triggered podiatry consult on 1/31, followed by debridement and new NWB status on LLE. MRI also performed secondary to acute encephlaopathy/AMS and revealed multiple acattered punctuate acute lunar infarcts. Pt was transferred from [facility] on 01/04/16.  Pt then underwent a L transmetatarsal amputation on the L and L percutaneous achilles tendon lengthening.    OT comments  Pt with slow improvements after most recent transmetatarsal amputation.  Pt very fearful of standing and falling therefore difficult to get into standing although feel she has the strength to do it.  Pt did lateral scoot transfer to chair adn was able to maintain NWB of LLE.  Pt may benefit from SARA lift due to her fear of falling in upcoming sessions.   Follow Up Recommendations  CIR;Supervision/Assistance - 24 hour    Equipment Recommendations  Other (comment) (defer to next venue)    Recommendations for Other Services Rehab consult    Precautions / Restrictions Precautions Precautions: Fall Precaution Comments: L foot infection/NWB Restrictions Weight Bearing Restrictions: Yes LLE Weight Bearing: Non weight bearing       Mobility Bed Mobility Overal bed mobility: Needs Assistance Bed Mobility: Supine to Sit     Supine to sit: Mod assist;HOB elevated     General bed mobility comments: Pt moved LEs and got to EOB with use of bedrail and HOB at 50 degrees. pt did need assist getting  trunk into full sitting but much improved.  Transfers Overall transfer level: Needs assistance Equipment used: 1 person hand held assist (max assist slide to chair with rail of chair and bed down.) Transfers: Lateral/Scoot Transfers          Lateral/Scoot Transfers: Max assist;From elevated surface General transfer comment: Pt very weak and could not achieve full standing position.  Pt was following all commands but seems to have a fear of falling as well as weakness making standing difficult since she is NWB.    Balance Overall balance assessment: Needs assistance Sitting-balance support: Feet supported Sitting balance-Leahy Scale: Fair Sitting balance - Comments: pt able to sit on EOB without physical assist but no challenges given.     Standing balance-Leahy Scale: Zero                     ADL Overall ADL's : Needs assistance/impaired Eating/Feeding: Independent;Sitting   Grooming: Oral care;Sitting                   Toilet Transfer: Maximal assistance;+2 for physical assistance;+2 for safety/equipment;Cueing for safety;Squat-pivot;BSC;Requires drop arm Toilet Transfer Details (indicate cue type and reason): Pt transferred from bed to drop arm chair with max assist. Transfer was downhill which was helpful.  Pt did help scoot with UEs and pull with UEs while therapist assisted with bed pad. Toileting- Clothing Manipulation and Hygiene: Total assistance;+2 for physical assistance;+2 for safety/equipment;Bed level       Functional mobility during ADLs: Maximal assistance;+2 for physical assistance General ADL Comments: Pt more alert and oriented today therefore UE adls  were easier for pt.  Pt with c/o R side abdominal/flank pain limiting mobility.  Attempted to get pt in standing when on EOB.  Pt has the strength in arms adn legs to stand with walker but is very anxious and really does not attempt to push up with RLE.  Pt actually pushes back into sitting when asked  to stand.  pt is able to follow all other commands correctly today so feel this may be due to fear of falling or anxiety. Pt may benefit from use of steady or SARA.      Vision                     Perception     Praxis      Cognition   Behavior During Therapy: Anxious Overall Cognitive Status: Within Functional Limits for tasks assessed       Memory: Decreased short-term memory  Following Commands: Follows multi-step commands with increased time            Extremity/Trunk Assessment               Exercises     Shoulder Instructions       General Comments      Pertinent Vitals/ Pain       Pain Assessment: Faces Faces Pain Scale: Hurts even more Pain Location: back pain and R flank and abdominal pain. Pain Descriptors / Indicators: Grimacing;Guarding;Discomfort Pain Intervention(s): Limited activity within patient's tolerance;Monitored during session;Repositioned;Premedicated before session  Home Living                                          Prior Functioning/Environment              Frequency Min 2X/week     Progress Toward Goals  OT Goals(current goals can now be found in the care plan section)  Progress towards OT goals: Progressing toward goals  Acute Rehab OT Goals Patient Stated Goal: be able to get back moving again.  OT Goal Formulation: With patient Time For Goal Achievement: 01/19/16 Potential to Achieve Goals: Good ADL Goals Pt Will Perform Grooming: with modified independence;with set-up;sitting Pt Will Perform Lower Body Bathing: with supervision;with adaptive equipment;sitting/lateral leans Pt Will Transfer to Toilet: with min assist;ambulating;bedside commode Pt Will Perform Toileting - Clothing Manipulation and hygiene: with supervision;sit to/from stand;sitting/lateral leans Pt Will Perform Tub/Shower Transfer: Tub transfer;with min assist;ambulating;rolling walker;tub bench Pt/caregiver will  Perform Home Exercise Program: Increased strength;Both right and left upper extremity;With Supervision;With written HEP provided Additional ADL Goal #1: Pt will sit w/o LOB during light functional activity w/o LOB in preparation for increased participation with self care tasks/endurance.  Plan Discharge plan remains appropriate    Co-evaluation                 End of Session Equipment Utilized During Treatment: Rolling walker   Activity Tolerance Patient limited by fatigue;Patient limited by pain   Patient Left in chair;with call bell/phone within reach;with family/visitor present   Nurse Communication Mobility status;Weight bearing status;Other (comment) (pt may benefit from use of SARA for mobility.)        Time: 1035-1110 OT Time Calculation (min): 35 min  Charges: OT General Charges $OT Visit: 1 Procedure OT Treatments $Self Care/Home Management : 23-37 mins  Hope Budds 01/09/2016, 11:27 AM  [phone]

## 2016-01-09 NOTE — Progress Notes (Signed)
Subjective: 1 Day Post-Op Procedure(s) (LRB): AMPUTATION TRANSMETATARSAL LEFT FOOT and achilles tendon lengthening (Left)  Patient reports pain as mild to moderate.  Tolerating POs well.  Admits to BM.  Denies fever, chills, N/V.  In good spirits and pleased with her care.  Objective:   VITALS:  Temp:  [97.7 F (36.5 C)-98.5 F (36.9 C)] 98.1 F (36.7 C) (02/06 0556) Pulse Rate:  [95-111] 98 (02/06 0735) Resp:  [16-19] 18 (02/06 0556) BP: (131-173)/(62-81) 159/81 mmHg (02/06 0735) SpO2:  [94 %-98 %] 96 % (02/06 0556)  General: WDWN patient in NAD. Psych:  Appropriate mood and affect. Neuro:  A&O x 3, Moving all extremities, sensation intact to light touch HEENT:  EOMs intact Chest:  Even non-labored respirations Skin: Dressing C/D/I, no rashes or lesions Extremities: warm/dry, no visible edema, erythmea, or echymosis.  No lymphadenopathy. Pulses: Poplitues 2+ MSK:  ROM: knee 0-90, MMT: patient is able to perform quad set    LABS  Recent Labs  01/07/16 0909 01/08/16 0540 01/09/16 0520  HGB 10.8* 10.1* 9.5*  WBC 20.8* 19.7* 25.1*  PLT 299 330 375    Recent Labs  01/08/16 0540 01/09/16 0520  NA 144 139  K 3.7 3.2*  CL 111 106  CO2 25 24  BUN 26* 22*  CREATININE 0.87 0.98  GLUCOSE 125* 151*   No results for input(s): LABPT, INR in the last 72 hours.   Assessment/Plan: 1 Day Post-Op Procedure(s) (LRB): AMPUTATION TRANSMETATARSAL LEFT FOOT and achilles tendon lengthening (Left)  Up with therapy  NWB LLE Aspirin  BID for DVT prophylaxis Plan for outpatient post-op visit with Dr. Victorino Dike in 2 weeks ABX per medicine team.  Alfredo Martinez, PA-C, ATC [facility] Office:  [phone]

## 2016-01-09 NOTE — Progress Notes (Signed)
Subjective:  Afebrile, patient feeling more alert though still has some sleepiness in the day time. She reports right hip pain  Interval hx: underwent  Left transmetatarsal amputation by Dr. Victorino Dike for source control   Antibiotics:  Anti-infectives    Start     Dose/Rate Route Frequency Ordered Stop   01/04/16 0200  nafcillin 2 g in dextrose 5 % 100 mL IVPB     2 g 200 mL/hr over 30 Minutes Intravenous Every 4 hours 01/04/16 0151     01/04/16 0045  nafcillin injection 2 g  Status:  Discontinued     2 g Intravenous Every 4 hours 01/04/16 0011 01/04/16 0151   01/01/16 2300  vancomycin (VANCOCIN) IVPB 750 mg/150 ml premix  Status:  Discontinued     750 mg 150 mL/hr over 60 Minutes Intravenous Every 12 hours 01/01/16 1042 01/03/16 1209   01/01/16 1400  ceFAZolin (ANCEF) IVPB 2 g/50 mL premix  Status:  Discontinued     2 g 100 mL/hr over 30 Minutes Intravenous 3 times per day 01/01/16 1041 01/04/16 0011   01/01/16 1200  vancomycin (VANCOCIN) 1,500 mg in sodium chloride 0.9 % 500 mL IVPB     1,500 mg 250 mL/hr over 120 Minutes Intravenous  Once 01/01/16 1041 01/01/16 1512      Medications: Scheduled Meds: .  stroke: mapping our early stages of recovery book   Does not apply Once  . amLODipine  10 mg Oral Daily  . antiseptic oral rinse  7 mL Mouth Rinse BID  . aspirin  81 mg Oral Daily  . brinzolamide  1 drop Both Eyes TID   And  . brimonidine  1 drop Both Eyes TID  . calcium-vitamin D  1 tablet Oral Daily  . Chlorhexidine Gluconate Cloth  6 each Topical Daily  . famotidine  20 mg Oral Daily  . feeding supplement (ENSURE ENLIVE)  237 mL Oral BID BM  . isosorbide mononitrate  30 mg Oral Daily  . latanoprost  1 drop Both Eyes QHS  . metoCLOPramide (REGLAN) injection  10 mg Intravenous 4 times per day  . mupirocin ointment  1 application Nasal BID  . nafcillin IV  2 g Intravenous Q4H  . nystatin  5 mL Oral QID  . pantoprazole  40 mg Oral BID  . sertraline  100 mg  Oral Daily  . simvastatin  10 mg Oral QHS   Objective: Weight change:   Intake/Output Summary (Last 24 hours) at 01/09/16 1146 Last data filed at 01/08/16 1638  Gross per 24 hour  Intake    300 ml  Output     50 ml  Net    250 ml   Blood pressure 159/81, pulse 98, temperature 98.1 F (36.7 C), temperature source Oral, resp. rate 18, height 5' 9.5" (1.765 m), weight 179 lb 0.2 oz (81.2 kg), SpO2 96 %. Temp:  [97.7 F (36.5 C)-98.5 F (36.9 C)] 98.1 F (36.7 C) (02/06 0556) Pulse Rate:  [95-111] 98 (02/06 0735) Resp:  [16-19] 18 (02/06 0556) BP: (131-173)/(62-81) 159/81 mmHg (02/06 0735) SpO2:  [94 %-98 %] 96 % (02/06 0556)  Physical Exam: General: Alert and awake, oriented, answers questions more appropriately HEENT:  EOMI Abd: scattered echymosis from lovenox injection Ext: Left foot/leg wrapped from surgery. Right leg pain with rotation Neuro: nonfocal  CBC:  CBC Latest Ref Rng 01/09/2016 01/08/2016 01/07/2016  WBC 4.0 - 10.5 K/uL 25.1(H) 19.7(H) 20.8(H)  Hemoglobin 12.0 -  15.0 g/dL 1.6(X) 10.1(L) 10.8(L)  Hematocrit 36.0 - 46.0 % 28.7(L) 29.9(L) 32.5(L)  Platelets 150 - 400 K/uL 375 330 299      BMET  Recent Labs  01/08/16 0540 01/09/16 0520  NA 144 139  K 3.7 3.2*  CL 111 106  CO2 25 24  GLUCOSE 125* 151*  BUN 26* 22*  CREATININE 0.87 0.98  CALCIUM 7.8* 7.9*   Lab Results  Component Value Date   ESRSEDRATE 75* 01/03/2016   Lab Results  Component Value Date   CRP 24.0* 01/03/2016    Micro Results: Recent Results (from the past 720 hour(s))  C difficile quick scan w PCR reflex     Status: None   Collection Time: 12/30/15 12:28 AM  Result Value Ref Range Status   C Diff antigen NEGATIVE NEGATIVE Final   C Diff toxin NEGATIVE NEGATIVE Final   C Diff interpretation Negative for toxigenic C. difficile  Final    Comment: NEGATIVE  Culture, blood (routine x 2)     Status: None   Collection Time: 12/31/15 12:16 PM  Result Value Ref Range Status    Specimen Description BLOOD RIGHT ARM  Final   Special Requests BOTTLES DRAWN AEROBIC AND ANAEROBIC 6CC  Final   Culture  Setup Time   Final    GRAM POSITIVE COCCI IN CLUSTERS IN BOTH AEROBIC AND ANAEROBIC BOTTLES CRITICAL RESULT CALLED TO, READ BACK BY AND VERIFIED WITH: Carnella Guadalajara, J. AT 0035 ON 01/01/2016 BY AGUNDIZ,E.    Culture   Final    STAPHYLOCOCCUS AUREUS Performed at [facility]    Report Status 01/03/2016 FINAL  Final   Organism ID, Bacteria STAPHYLOCOCCUS AUREUS  Final      Susceptibility   Staphylococcus aureus - MIC*    CIPROFLOXACIN <=0.5 SENSITIVE Sensitive     ERYTHROMYCIN <=0.25 SENSITIVE Sensitive     GENTAMICIN <=0.5 SENSITIVE Sensitive     OXACILLIN <=0.25 SENSITIVE Sensitive     TETRACYCLINE <=1 SENSITIVE Sensitive     VANCOMYCIN <=0.5 SENSITIVE Sensitive     TRIMETH/SULFA <=10 SENSITIVE Sensitive     CLINDAMYCIN <=0.25 SENSITIVE Sensitive     RIFAMPIN <=0.5 SENSITIVE Sensitive     Inducible Clindamycin NEGATIVE Sensitive     * STAPHYLOCOCCUS AUREUS  Culture, blood (routine x 2)     Status: None   Collection Time: 12/31/15 12:33 PM  Result Value Ref Range Status   Specimen Description BLOOD RIGHT ARM  Final   Special Requests BOTTLES DRAWN AEROBIC AND ANAEROBIC 6CC  Final   Culture  Setup Time   Final    GRAM POSITIVE COCCI IN CLUSTERS IN BOTH AEROBIC AND ANAEROBIC BOTTLES CRITICAL RESULT CALLED TO, READ BACK BY AND VERIFIED WITH: LAVINDER,J. AT 0960 ON 01/01/2016 BY AGUNDIZ,E.    Culture   Final    STAPHYLOCOCCUS AUREUS SUSCEPTIBILITIES PERFORMED ON PREVIOUS CULTURE WITHIN THE LAST 5 DAYS. Performed at [facility]    Report Status 01/03/2016 FINAL  Final  Culture, blood (Routine X 2) w Reflex to ID Panel     Status: None   Collection Time: 01/02/16  2:11 PM  Result Value Ref Range Status   Specimen Description BLOOD RIGHT ANTECUBITAL  Final   Special Requests   Final    BOTTLES DRAWN AEROBIC AND ANAEROBIC 6CC EACH  IMMUNE:COMPROMISED    Culture NO GROWTH 6 DAYS  Final   Report Status 01/08/2016 FINAL  Final  Culture, blood (Routine X 2) w Reflex to ID Panel  Status: None   Collection Time: 01/02/16  2:13 PM  Result Value Ref Range Status   Specimen Description BLOOD RIGHT HAND  Final   Special Requests   Final    BOTTLES DRAWN AEROBIC AND ANAEROBIC 6CC  IMMUNE:COMPROMISED   Culture NO GROWTH 6 DAYS  Final   Report Status 01/08/2016 FINAL  Final  Wound culture     Status: None   Collection Time: 01/03/16  6:43 PM  Result Value Ref Range Status   Specimen Description WOUND LEFT FOOT  Final   Special Requests NONE  Final   Gram Stain   Final    NO WBC SEEN NO SQUAMOUS EPITHELIAL CELLS SEEN NO ORGANISMS SEEN Performed at [facility]    Culture   Final    MODERATE STAPHYLOCOCCUS AUREUS Note: RIFAMPIN AND GENTAMICIN SHOULD NOT BE USED AS SINGLE DRUGS FOR TREATMENT OF STAPH INFECTIONS. Performed at [facility]    Report Status 01/06/2016 FINAL  Final   Organism ID, Bacteria STAPHYLOCOCCUS AUREUS  Final      Susceptibility   Staphylococcus aureus - MIC*    CLINDAMYCIN <=0.25 SENSITIVE Sensitive     ERYTHROMYCIN <=0.25 SENSITIVE Sensitive     GENTAMICIN <=0.5 SENSITIVE Sensitive     LEVOFLOXACIN <=0.12 SENSITIVE Sensitive     OXACILLIN <=0.25 SENSITIVE Sensitive     RIFAMPIN <=0.5 SENSITIVE Sensitive     TRIMETH/SULFA <=10 SENSITIVE Sensitive     VANCOMYCIN 1 SENSITIVE Sensitive     TETRACYCLINE <=1 SENSITIVE Sensitive     MOXIFLOXACIN <=0.25 SENSITIVE Sensitive     * MODERATE STAPHYLOCOCCUS AUREUS  Culture, blood (routine x 2)     Status: None   Collection Time: 01/05/16  8:06 PM  Result Value Ref Range Status   Specimen Description BLOOD RIGHT ARM  Final   Special Requests IN PEDIATRIC BOTTLE 4CC  Final   Culture  Setup Time   Final    GRAM POSITIVE COCCI IN CLUSTERS AEROBIC BOTTLE ONLY CRITICAL RESULT CALLED TO, READ BACK BY AND VERIFIED WITH: J EDWARDS RN 1556 01/06/16 A BROWNING      Culture STAPHYLOCOCCUS AUREUS  Final   Report Status 01/08/2016 FINAL  Final   Organism ID, Bacteria STAPHYLOCOCCUS AUREUS  Final      Susceptibility   Staphylococcus aureus - MIC*    CIPROFLOXACIN <=0.5 SENSITIVE Sensitive     ERYTHROMYCIN <=0.25 SENSITIVE Sensitive     GENTAMICIN <=0.5 SENSITIVE Sensitive     OXACILLIN 0.5 SENSITIVE Sensitive     TETRACYCLINE <=1 SENSITIVE Sensitive     VANCOMYCIN <=0.5 SENSITIVE Sensitive     TRIMETH/SULFA <=10 SENSITIVE Sensitive     CLINDAMYCIN <=0.25 SENSITIVE Sensitive     RIFAMPIN <=0.5 SENSITIVE Sensitive     Inducible Clindamycin NEGATIVE Sensitive     * STAPHYLOCOCCUS AUREUS  Culture, blood (routine x 2)     Status: None (Preliminary result)   Collection Time: 01/05/16  8:08 PM  Result Value Ref Range Status   Specimen Description BLOOD RIGHT HAND  Final   Special Requests BOTTLES DRAWN AEROBIC ONLY 9CC  Final   Culture NO GROWTH 3 DAYS  Final   Report Status PENDING  Incomplete  Culture, blood (routine x 2)     Status: None (Preliminary result)   Collection Time: 01/06/16  4:55 PM  Result Value Ref Range Status   Specimen Description BLOOD LEFT ANTECUBITAL  Final   Special Requests BOTTLES DRAWN AEROBIC AND ANAEROBIC 10CC  Final   Culture NO  GROWTH 2 DAYS  Final   Report Status PENDING  Incomplete  Culture, blood (routine x 2)     Status: None (Preliminary result)   Collection Time: 01/06/16  4:59 PM  Result Value Ref Range Status   Specimen Description BLOOD BLOOD LEFT HAND  Final   Special Requests BOTTLES DRAWN AEROBIC AND ANAEROBIC 10CC  Final   Culture NO GROWTH 2 DAYS  Final   Report Status PENDING  Incomplete  Surgical PCR screen     Status: Abnormal   Collection Time: 01/08/16  2:44 PM  Result Value Ref Range Status   MRSA, PCR NEGATIVE NEGATIVE Final   Staphylococcus aureus POSITIVE (A) NEGATIVE Final    Comment:        The Xpert SA Assay (FDA approved for NASAL specimens in patients over 45 years of age), is one  component of a comprehensive surveillance program.  Test performance has been validated by [facility] for patients greater than or equal to 87 year old. It is not intended to diagnose infection nor to guide or monitor treatment.   C difficile quick scan w PCR reflex     Status: None   Collection Time: 01/08/16  3:45 PM  Result Value Ref Range Status   C Diff antigen NEGATIVE NEGATIVE Final   C Diff toxin NEGATIVE NEGATIVE Final   C Diff interpretation Negative for toxigenic C. difficile  Final   Assessment/Plan:   Principal Problem:   Sepsis (HCC) Active Problems:   Hypertension   Hyperlipidemia   Gastroenteritis   Glaucoma   Staphylococcus aureus bacteremia   Diminished pulses in lower extremity   Emesis   Cerebral embolism with cerebral infarction   Bacterial endocarditis   Chronic osteomyelitis of left foot (HCC)   Chest tightness   Chest pain   Shortness of breath   Loose stools    Cassandra [name] is a 82 y.o. 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 Her vegetation is very long but not wide diameter. She has intermittent bacteremia despite being on nafcillin thought to be due either her foot being constant source of infection vs. Her vegetation. She is pod #1 s/p left TM amputation  #1 Methicillin sensitive staph coccus aureus native valve endocarditis with large lengthwise vegetation with CNS emboli and osteomyelitis of her  left foot, now with recurrent bacteremia despite being on appropriate antibiotic therapy.  --continue Nafcillin for CNS penetration -- repeat blood cx on 2/3 remains NGTD --Greatly appreciate cardiology cardiothoracic surgery orthopedic surgery, Neurology  and the primary team following the patient very closely.  #2 leukocytosis = in part reactive from surgery/ source control of infection. Will  continue to monitor CBC to see if improving.  #3 right hip pain = recommend imaging of hip to see if she has septic arthritis which would account for her leukocytosis and pain  #4 encephalopathy = improved.     LOS: 8 days   Meghann Landing 01/09/2016, 11:46 AM

## 2016-01-09 NOTE — Progress Notes (Signed)
Physical Therapy Treatment Patient Details Name: Cassandra [name] MRN: [id number] DOB: Nov 11, 1933 Today's Date: 01/09/2016    History of Present Illness 82yo white female who presents to [facility] on 12/29/15 after 2 days of N/V, R flank pain, and generalized weakness. Pt admitted for Gastroenteritis after workup. At baseline, pt reports she is a fully indep community ambulator without AD. PMH: HTN, HLD, and L hallux amputation 1ya. During past few days patient has been increasingly lethargic and somnolent. MRI findings triggered podiatry consult on 1/31, followed by debridement and new NWB status on LLE. MRI also performed secondary to acute encephlaopathy/AMS and revealed multiple acattered punctuate acute lunar infarcts. Pt was transferred from [facility] on 01/04/16.  Pt then underwent a L transmetatarsal amputation on the L and L percutaneous achilles tendon lengthening.     PT Comments    PT session focusing on functional mobility. At this time the patient is requiring +2 max assistance for transfers. Will continue to follow and attempt to progress mobility. Patient admits to being nervous about falling during mobility.   Follow Up Recommendations  CIR     Equipment Recommendations  None recommended by PT    Recommendations for Other Services Rehab consult     Precautions / Restrictions Precautions Precautions: Fall Precaution Comments: L foot infection/NWB Restrictions Weight Bearing Restrictions: Yes LLE Weight Bearing: Non weight bearing    Mobility  Bed Mobility Overal bed mobility: Needs Assistance Bed Mobility: Rolling;Sit to Supine Rolling: Min assist (with cues and rails)   Supine to sit: Mod assist;HOB elevated Sit to supine: +2 for physical assistance;Max assist   General bed mobility comments: Patient sitting at the edge of the bed, unable to push self back onto bed, max assist needed with trunk and LEs.   Transfers Overall transfer level: Needs  assistance Equipment used: Rolling walker (2 wheeled) Transfers: Sit to/from [employer] Sit to Stand: +2 physical assistance;Max assist Stand pivot transfers: +2 physical assistance;Max assist      Lateral/Scoot Transfers: Max assist;From elevated surface General transfer comment: Requiring 3 attempts to achieve standing from chair, encouraging patient to assist with UEs and Rt LE.   Ambulation/Gait                 Stairs            Wheelchair Mobility    Modified Rankin (Stroke Patients Only)       Balance Overall balance assessment: Needs assistance Sitting-balance support: Feet supported Sitting balance-Leahy Scale: Fair Sitting balance - Comments: pt able to sit on EOB without physical assist but no challenges given.     Standing balance-Leahy Scale: Zero Standing balance comment: requiring +2 max assist                    Cognition Arousal/Alertness: Awake/alert Behavior During Therapy: Anxious Overall Cognitive Status: Within Functional Limits for tasks assessed                   Exercises      General Comments General comments (skin integrity, edema, etc.): Patient noted to be soiled during transfer from chair to bed. Multiple rolls bilaterally needed to get cleaned and change linens. Nursing assisting.       Pertinent Vitals/Pain Pain Assessment: Faces Faces Pain Scale: Hurts even more Pain Location: Rt hip Pain Descriptors / Indicators: Grimacing;Guarding Pain Intervention(s): Limited activity within patient's tolerance;Monitored during session;Ice applied    Home Living  Prior Function            PT Goals (current goals can now be found in the care plan section) Acute Rehab PT Goals Patient Stated Goal: get back to bed PT Goal Formulation: With patient Time For Goal Achievement: 01/19/16 Potential to Achieve Goals: Good Progress towards PT goals: Progressing toward  goals    Frequency  Min 3X/week    PT Plan Current plan remains appropriate    Co-evaluation             End of Session Equipment Utilized During Treatment: Gait belt Activity Tolerance: Patient limited by fatigue Patient left: in bed;with call bell/phone within reach;with bed alarm set     Time: 8119-1478 PT Time Calculation (min) (ACUTE ONLY): 31 min  Charges:  $Therapeutic Activity: 23-37 mins                    G Codes:      Christiane Ha, PT, CSCS Pager [phone] Office [phone]  01/09/2016, 1:46 PM

## 2016-01-09 NOTE — Progress Notes (Signed)
[facility] PROGRESS NOTE  Cassandra [name] ZOX:[id number] DOB: 07-30-1933 DOA: 12/29/2015 PCP: Colette Ribas, MD  Summary:   82 year old female who presented to the hospital with complaints of nausea, vomiting and abdominal pain. She was found to have a possible viral gastroenteritis and was treated supportively. During her hospital stay, she developed significant fevers. Blood cultures were sent and returned positive for MSSA bacteremia. He is not entirely clear. It may be related to the chronic wound on her foot. MRI of the foot could not rule out underlying osteoarthritis versus abscess. Podiatry has been consulted. Currently she is on intravenous antibiotics. Anticipate that she'll be in the hospital several more days. Eventual plan will be to discharge to skilled nursing facility.  Assessment/Plan: 1. Sepsis/Endocarditis. Present on admission. Evidenced by blood cultures from 12/31/2015 growing  MSSA, white count of 25,200, having a temp of 101.7 on admission. Case was discussed with Dr. Ilsa Iha of infectious disease who will consult. She is currently on Nafcillin 2 g IV every 4 hours. With MRI of brain showing multiple scattered infarcts there was concern for septic emboli. She underwent a transesophageal echocardiogram on 01/05/2016 which revealed large hypermobile mitral valve vegetation. Source of bacteremia not entirely clear at this time, questionable source of foot ulcer. CT surgery consulted for recommendations on surgical intervention. Case was discussed with CT surgery and ID, recommended consulting orthopedic surgery to assess wound at left foot. Repeat blood cultures from 01/06/2016: 1/2 positive for MSSA. On 01/08/2016 she was taken to the OR where she underwent transmetatarsal amputation of left foot osteo.  Repeat blood cultures from 01/06/2016 showing no growth to date.         She does not have evidence of heart failure.       Cardiology following   2. Septic emboli- MRI  performed 01/04/2016 showed Multiple (approximately 6) scattered punctate acute lacunar infarcts in the brain. Left MCA, bilateral PCA, and SCA territories are affected. This was further workup with transesophageal echocardiogram that revealed mitral valve vegetation. She is on IV antibiotic therapy with nafcillin. CT surgery consulted to determine if she is a candidate for surgical intervention. From a neurologic standpoint she seems stable.  3. Chronic left foot wound . MRI of foot shows possible underlying osteomyelitis, cannot rule out underlying abscess. May be related to bacteremia. Podiatry consulted, she was evaluated by [facility]McKinney at [facility]. Wound culture grew MSSA. On 01/07/2016 Case discussed with Dr Shon Baton of Orthopedic Surgery for evaluation of foot and MRI findings. Suspect this to be source of infection.       On 01/08/2016 she underwent  transmetatarsal amputation of left foot for chronic osteomyelitis.  4.   Right Flank Pain She complains of severe right flank pain, will further work up with CT scan of Abd/Pelvis with IV contrast.    4. Chest Pain. Patient having several episodes of CP during this hospitalization, Cars consulted. She was started on Imdur  5. Dehydration, IV fluids stopped given concerns for fluid overload. 6. Hypokalemia, AM labs showing K of 3.2, will provide oral potassium replacement.  7. Weakness, likely related to # 1, occupational therapy recommending inpatient rehabilitation, also placed. 8. HTN, BP elevated- ? Relation to emboli and perfusion deficits seen on MRI. Remains hypertensive, will increase Norvasc from 5 mg PO q daily to 10 mg PO q daily.  9. HLD, continue statin   Code Status: Full DVT prophylaxis: Heparin Family Communication: I spoke with her daughter and granddaughter who were present  at bedside Disposition Plan: Plan for transmet amputation   Consultants:  PT - SNF  Infectious  Disease  Podiatry  Neurology  Cardiology  Orthopedic surgery  Procedures:  ECHO Study Conclusions- Left ventricle: The cavity size was normal. Systolic function was vigorous. The estimated ejection fraction was in the range of 65% to 70%. Wall motion was normal; there were no regional wall motion abnormalities. Doppler parameters are consistent with abnormal left ventricular relaxation (grade 1 diastolic dysfunction). There was no evidence of elevated ventricular filling pressure by Doppler parameters. - Aortic valve: Trileaflet; normal thickness leaflets. There was no regurgitation. - Aortic root: The aortic root was normal in size. - Mitral valve: Structurally normal valve. There was mild regurgitation. - Left atrium: The atrium was mildly dilated. - Right ventricle: Systolic function was normal. - Tricuspid valve: There was mild regurgitation. - Pulmonic valve: There was no regurgitation. - Pulmonary arteries: Systolic pressure was moderately increased. PA peak pressure: 48 mm Hg (S). - Inferior vena cava: The vessel was normal in size. - Pericardium, extracardiac: There was no pericardial effusion.  Antibiotics:  Vancomycin 1/29>>1/31  Cefazolin 1/29>> 2/1  Nafcillin 2/1>>  HPI/Subjective: She complains of right sided pain, states worse today. Denies fevers, chills, N/V  Objective: Filed Vitals:   01/09/16 0735 01/09/16 1244  BP: 159/81 133/81  Pulse: 98 113  Temp:  98.3 F (36.8 C)  Resp:  18    Intake/Output Summary (Last 24 hours) at 01/09/16 1518 Last data filed at 01/09/16 1447  Gross per 24 hour  Intake 1419.17 ml  Output     50 ml  Net 1369.17 ml   Filed Weights   12/29/15 0759 12/29/15 1957  Weight: 80.74 kg (178 lb) 81.2 kg (179 lb 0.2 oz)    Exam: General: NAD. She is awake and alert.  Cardiovascular: Regular rate and rhythm. I/VI systolic murmur heard best at aortic and pulmonic area  Respiratory: CTAB. No  wheezes, rales, or rhonchi.   Abdomen: soft, non tender, no distention, bowel sounds normal Musculoskeletal: No edema b/l. S/p left greater toe amputated. Min erythema over amputation site.     Data Reviewed: Basic Metabolic Panel:  Recent Labs Lab 01/04/16 0542 01/06/16 0542 01/07/16 0909 01/08/16 0540 01/09/16 0520  NA 136 143 142 144 139  K 3.8 3.4* 2.9* 3.7 3.2*  CL 106 108 106 111 106  CO2 21* GLUCOSE 129* 132* 115* 125* 151*  BUN 47* 45* 31* 26* 22*  CREATININE 1.05* 1.03* 0.96 0.87 0.98  CALCIUM 8.7* 8.3* 8.2* 7.8* 7.9*   Liver Function Tests: No results for input(s): AST, ALT, ALKPHOS, BILITOT, PROT, ALBUMIN in the last 168 hours. No results for input(s): LIPASE, AMYLASE in the last 168 hours. CBC:  Recent Labs Lab 01/03/16 0525 01/04/16 0542 01/07/16 0909 01/08/16 0540 01/09/16 0520  WBC 20.0* 25.2* 20.8* 19.7* 25.1*  HGB 12.0 12.7 10.8* 10.1* 9.5*  HCT 34.6* 37.3 32.5* 29.9* 28.7*  MCV 88.7 88.8 90.8 91.4 92.0  PLT 179 204 299 330 375   Cardiac Enzymes:  Recent Labs Lab 01/06/16 0734 01/06/16 1351 01/06/16 2012  TROPONINI 0.08* 0.13* 0.10*     Recent Results (from the past 240 hour(s))  Culture, blood (routine x 2)     Status: None   Collection Time: 12/31/15 12:16 PM  Result Value Ref Range Status   Specimen Description BLOOD RIGHT ARM  Final   Special Requests BOTTLES DRAWN AEROBIC AND ANAEROBIC 6CC  Final  Culture  Setup Time   Final    GRAM POSITIVE COCCI IN CLUSTERS IN BOTH AEROBIC AND ANAEROBIC BOTTLES CRITICAL RESULT CALLED TO, READ BACK BY AND VERIFIED WITH: Carnella Guadalajara, J. AT 0035 ON 01/01/2016 BY AGUNDIZ,E.    Culture   Final    STAPHYLOCOCCUS AUREUS Performed at [facility]    Report Status 01/03/2016 FINAL  Final   Organism ID, Bacteria STAPHYLOCOCCUS AUREUS  Final      Susceptibility   Staphylococcus aureus - MIC*    CIPROFLOXACIN <=0.5 SENSITIVE Sensitive     ERYTHROMYCIN <=0.25 SENSITIVE Sensitive      GENTAMICIN <=0.5 SENSITIVE Sensitive     OXACILLIN <=0.25 SENSITIVE Sensitive     TETRACYCLINE <=1 SENSITIVE Sensitive     VANCOMYCIN <=0.5 SENSITIVE Sensitive     TRIMETH/SULFA <=10 SENSITIVE Sensitive     CLINDAMYCIN <=0.25 SENSITIVE Sensitive     RIFAMPIN <=0.5 SENSITIVE Sensitive     Inducible Clindamycin NEGATIVE Sensitive     * STAPHYLOCOCCUS AUREUS  Culture, blood (routine x 2)     Status: None   Collection Time: 12/31/15 12:33 PM  Result Value Ref Range Status   Specimen Description BLOOD RIGHT ARM  Final   Special Requests BOTTLES DRAWN AEROBIC AND ANAEROBIC 6CC  Final   Culture  Setup Time   Final    GRAM POSITIVE COCCI IN CLUSTERS IN BOTH AEROBIC AND ANAEROBIC BOTTLES CRITICAL RESULT CALLED TO, READ BACK BY AND VERIFIED WITH: LAVINDER,J. AT 1610 ON 01/01/2016 BY AGUNDIZ,E.    Culture   Final    STAPHYLOCOCCUS AUREUS SUSCEPTIBILITIES PERFORMED ON PREVIOUS CULTURE WITHIN THE LAST 5 DAYS. Performed at [facility]    Report Status 01/03/2016 FINAL  Final  Culture, blood (Routine X 2) w Reflex to ID Panel     Status: None   Collection Time: 01/02/16  2:11 PM  Result Value Ref Range Status   Specimen Description BLOOD RIGHT ANTECUBITAL  Final   Special Requests   Final    BOTTLES DRAWN AEROBIC AND ANAEROBIC 6CC EACH  IMMUNE:COMPROMISED   Culture NO GROWTH 6 DAYS  Final   Report Status 01/08/2016 FINAL  Final  Culture, blood (Routine X 2) w Reflex to ID Panel     Status: None   Collection Time: 01/02/16  2:13 PM  Result Value Ref Range Status   Specimen Description BLOOD RIGHT HAND  Final   Special Requests   Final    BOTTLES DRAWN AEROBIC AND ANAEROBIC 6CC  IMMUNE:COMPROMISED   Culture NO GROWTH 6 DAYS  Final   Report Status 01/08/2016 FINAL  Final  Wound culture     Status: None   Collection Time: 01/03/16  6:43 PM  Result Value Ref Range Status   Specimen Description WOUND LEFT FOOT  Final   Special Requests NONE  Final   Gram Stain   Final    NO WBC  SEEN NO SQUAMOUS EPITHELIAL CELLS SEEN NO ORGANISMS SEEN Performed at [facility]    Culture   Final    MODERATE STAPHYLOCOCCUS AUREUS Note: RIFAMPIN AND GENTAMICIN SHOULD NOT BE USED AS SINGLE DRUGS FOR TREATMENT OF STAPH INFECTIONS. Performed at [facility]    Report Status 01/06/2016 FINAL  Final   Organism ID, Bacteria STAPHYLOCOCCUS AUREUS  Final      Susceptibility   Staphylococcus aureus - MIC*    CLINDAMYCIN <=0.25 SENSITIVE Sensitive     ERYTHROMYCIN <=0.25 SENSITIVE Sensitive     GENTAMICIN <=0.5 SENSITIVE Sensitive  LEVOFLOXACIN <=0.12 SENSITIVE Sensitive     OXACILLIN <=0.25 SENSITIVE Sensitive     RIFAMPIN <=0.5 SENSITIVE Sensitive     TRIMETH/SULFA <=10 SENSITIVE Sensitive     VANCOMYCIN 1 SENSITIVE Sensitive     TETRACYCLINE <=1 SENSITIVE Sensitive     MOXIFLOXACIN <=0.25 SENSITIVE Sensitive     * MODERATE STAPHYLOCOCCUS AUREUS  Culture, blood (routine x 2)     Status: None   Collection Time: 01/05/16  8:06 PM  Result Value Ref Range Status   Specimen Description BLOOD RIGHT ARM  Final   Special Requests IN PEDIATRIC BOTTLE 4CC  Final   Culture  Setup Time   Final    GRAM POSITIVE COCCI IN CLUSTERS AEROBIC BOTTLE ONLY CRITICAL RESULT CALLED TO, READ BACK BY AND VERIFIED WITH: J EDWARDS RN 1556 01/06/16 A BROWNING    Culture STAPHYLOCOCCUS AUREUS  Final   Report Status 01/08/2016 FINAL  Final   Organism ID, Bacteria STAPHYLOCOCCUS AUREUS  Final      Susceptibility   Staphylococcus aureus - MIC*    CIPROFLOXACIN <=0.5 SENSITIVE Sensitive     ERYTHROMYCIN <=0.25 SENSITIVE Sensitive     GENTAMICIN <=0.5 SENSITIVE Sensitive     OXACILLIN 0.5 SENSITIVE Sensitive     TETRACYCLINE <=1 SENSITIVE Sensitive     VANCOMYCIN <=0.5 SENSITIVE Sensitive     TRIMETH/SULFA <=10 SENSITIVE Sensitive     CLINDAMYCIN <=0.25 SENSITIVE Sensitive     RIFAMPIN <=0.5 SENSITIVE Sensitive     Inducible Clindamycin NEGATIVE Sensitive     * STAPHYLOCOCCUS AUREUS   Culture, blood (routine x 2)     Status: None (Preliminary result)   Collection Time: 01/05/16  8:08 PM  Result Value Ref Range Status   Specimen Description BLOOD RIGHT HAND  Final   Special Requests BOTTLES DRAWN AEROBIC ONLY 9CC  Final   Culture NO GROWTH 4 DAYS  Final   Report Status PENDING  Incomplete  Culture, blood (routine x 2)     Status: None (Preliminary result)   Collection Time: 01/06/16  4:55 PM  Result Value Ref Range Status   Specimen Description BLOOD LEFT ANTECUBITAL  Final   Special Requests BOTTLES DRAWN AEROBIC AND ANAEROBIC 10CC  Final   Culture NO GROWTH 3 DAYS  Final   Report Status PENDING  Incomplete  Culture, blood (routine x 2)     Status: None (Preliminary result)   Collection Time: 01/06/16  4:59 PM  Result Value Ref Range Status   Specimen Description BLOOD BLOOD LEFT HAND  Final   Special Requests BOTTLES DRAWN AEROBIC AND ANAEROBIC 10CC  Final   Culture NO GROWTH 3 DAYS  Final   Report Status PENDING  Incomplete  Surgical PCR screen     Status: Abnormal   Collection Time: 01/08/16  2:44 PM  Result Value Ref Range Status   MRSA, PCR NEGATIVE NEGATIVE Final   Staphylococcus aureus POSITIVE (A) NEGATIVE Final    Comment:        The Xpert SA Assay (FDA approved for NASAL specimens in patients over 37 years of age), is one component of a comprehensive surveillance program.  Test performance has been validated by [facility] for patients greater than or equal to 104 year old. It is not intended to diagnose infection nor to guide or monitor treatment.   C difficile quick scan w PCR reflex     Status: None   Collection Time: 01/08/16  3:45 PM  Result Value Ref Range Status   C Diff  antigen NEGATIVE NEGATIVE Final   C Diff toxin NEGATIVE NEGATIVE Final   C Diff interpretation Negative for toxigenic C. difficile  Final     Studies: Mr Lodema Pilot Contrast  02-02-16  CLINICAL DATA:  82 year old female with altered mental status, not responding.  Initial encounter. EXAM: MRI HEAD WITHOUT AND WITH CONTRAST TECHNIQUE: Multiplanar, multiecho pulse sequences of the brain and surrounding structures were obtained without and with intravenous contrast. CONTRAST:  16mL MULTIHANCE GADOBENATE DIMEGLUMINE 529 MG/ML IV SOLN COMPARISON:  Head CT without contrast 06/14/2014. Brain MRI, MRA a 03/13/2012. FINDINGS: About half a dozen punctate foci of restricted diffusion are demonstrated scattered in the bilateral hemispheres and superior cerebellum. One of these areas is in the medial right occipital lobe PCA territory where there is suggestion of mild underlying cortical encephalomalacia (series 102, image 19). Left MCA, bilateral PCA, and superior cerebellar artery territories are affected. No acute intracranial hemorrhage identified. No midline shift, mass effect, or evidence of intracranial mass lesion. Major intracranial vascular flow voids are stable. Minimal to mild generalized cerebral volume loss since 2013. No ventriculomegaly, extra-axial collection or acute intracranial hemorrhage. Cervicomedullary junction and pituitary are within normal limits. Negative visualized cervical spine. Patchy and scattered bilateral cerebral white matter T2 and FLAIR hyperintensity is stable. No abnormal enhancement identified. Visible internal auditory structures appear normal. Mild chronic right mastoid effusion is unchanged. Nasopharynx remains normal. Trace paranasal sinus mucosal thickening. Chronic postoperative changes to both globes. Negative scalp soft tissues. IMPRESSION: 1. 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 Left MCA, bilateral PCA, and SCA territories are affected. Therefore, consider recent embolic event from the heart or proximal aorta (less likely posterior circulation only thromboembolism). 2. No associated hemorrhage or mass effect. 3. Otherwise no significant change in the MRI appearance of the brain since 2013.  Electronically Signed   By: Odessa Fleming M.D.   On: 02-02-16 09:49   Mr Foot Left Wo Contrast  01/03/2016  CLINICAL DATA:  Bacteremia. Open wounds on the left foot with history of amputation. EXAM: MRI OF THE LEFT FOREFOOT WITHOUT CONTRAST TECHNIQUE: Multiplanar, multisequence MR imaging was performed. No intravenous contrast was administered. COMPARISON:  01/02/2016; 09/15/2014 FINDINGS: The patient refused IV contrast. Despite efforts by the technologist and patient, severe motion artifact is present on today's exam and could not be eliminated. This reduces exam sensitivity and specificity. First digit amputation at the MTP joint, with a approximately 1.5 by 0.8 by 0.8 cm fluid collection just plantar to the head of the first metatarsal as shown on images 27-32 of series 10, and surrounding abnormal inflammatory effacement of the adipose tissues. Abscess not excluded. Low signal intensity focus on image 33 series 9 possibly a tiny microscopic metallic particle or tiny focus of gas along the amputation margin. As on conventional radiographs, we demonstrate erosive findings in the head of the second and third metatarsals as well as in the bases of the proximal phalanges of the second and third toes. There is some low-level increased T2 signal along the third metatarsal head as on image 27 series 10 Low-level edema tracks along the plantar musculature of the foot. There is some mild subcutaneous edema dorsally in the foot. Degenerative findings along the Lisfranc joint. No Lisfranc joint malalignment identified. IMPRESSION: 1. Erosive findings at the second and third metatarsophalangeal joints are confirmed. This could represent infection, gout, or rheumatoid arthropathy. Particularly in the head of the third metatarsal, osteomyelitis is of greater suspicion. Today's images are  severely degraded by motion artifact, and the patient refused IV contrast. 2. Fluid collection plantar to the head of the first metatarsal  -abscess not excluded given the inflammatory effacement of the surrounding fatty subcutaneous tissues. Electronically Signed   By: Gaylyn Rong M.D.   On: 01/03/2016 08:41    Scheduled Meds: .  stroke: mapping our early stages of recovery book   Does not apply Once  . amLODipine  10 mg Oral Daily  . antiseptic oral rinse  7 mL Mouth Rinse BID  . aspirin  81 mg Oral Daily  . brinzolamide  1 drop Both Eyes TID   And  . brimonidine  1 drop Both Eyes TID  . calcium-vitamin D  1 tablet Oral Daily  . Chlorhexidine Gluconate Cloth  6 each Topical Daily  . famotidine  20 mg Oral Daily  . feeding supplement (ENSURE ENLIVE)  237 mL Oral BID BM  . isosorbide mononitrate  30 mg Oral Daily  . latanoprost  1 drop Both Eyes QHS  . metoCLOPramide (REGLAN) injection  10 mg Intravenous 4 times per day  . mupirocin ointment  1 application Nasal BID  . nafcillin IV  2 g Intravenous Q4H  . nystatin  5 mL Oral QID  . pantoprazole  40 mg Oral BID  . potassium chloride  40 mEq Oral Q6H  . sertraline  100 mg Oral Daily  . simvastatin  10 mg Oral QHS   Continuous Infusions: . sodium chloride 50 mL/hr at 01/09/16 1446    Principal Problem:   Sepsis (HCC) Active Problems:   Hypertension   Hyperlipidemia   Gastroenteritis   Glaucoma   Staphylococcus aureus bacteremia   Diminished pulses in lower extremity   Emesis   Cerebral embolism with cerebral infarction   Bacterial endocarditis   Chronic osteomyelitis of left foot (HCC)   Chest tightness   Chest pain   Shortness of breath   Loose stools   Time spent: 35 minutes   Mahala Menghini MD  Resident Physician [facility] 01/09/2016, 3:18 PM  LOS: 8 days

## 2016-01-10 ENCOUNTER — Encounter (HOSPITAL_COMMUNITY): Payer: Self-pay | Admitting: Orthopedic Surgery

## 2016-01-10 DIAGNOSIS — I340 Nonrheumatic mitral (valve) insufficiency: Secondary | ICD-10-CM

## 2016-01-10 DIAGNOSIS — R339 Retention of urine, unspecified: Secondary | ICD-10-CM

## 2016-01-10 LAB — CBC
HCT: 23.5 % — ABNORMAL LOW (ref 36.0–46.0)
Hemoglobin: 7.6 g/dL — ABNORMAL LOW (ref 12.0–15.0)
MCH: 29.5 pg (ref 26.0–34.0)
MCHC: 32.3 g/dL (ref 30.0–36.0)
MCV: 91.1 fL (ref 78.0–100.0)
Platelets: 355 10*3/uL (ref 150–400)
RBC: 2.58 MIL/uL — ABNORMAL LOW (ref 3.87–5.11)
RDW: 14.6 % (ref 11.5–15.5)
WBC: 17.8 10*3/uL — ABNORMAL HIGH (ref 4.0–10.5)

## 2016-01-10 LAB — BASIC METABOLIC PANEL
Anion gap: 10 (ref 5–15)
BUN: 14 mg/dL (ref 6–20)
CALCIUM: 7.6 mg/dL — AB (ref 8.9–10.3)
CO2: 23 mmol/L (ref 22–32)
CREATININE: 0.85 mg/dL (ref 0.44–1.00)
Chloride: 102 mmol/L (ref 101–111)
GFR calc Af Amer: >60 mL/min (ref >60)
GLUCOSE: 106 mg/dL — AB (ref 65–99)
Potassium: 3.7 mmol/L (ref 3.5–5.1)
Sodium: 135 mmol/L (ref 135–145)

## 2016-01-10 LAB — CULTURE, BLOOD (ROUTINE X 2): CULTURE: NO GROWTH

## 2016-01-10 LAB — ABO/RH: ABO/RH(D): O POS

## 2016-01-10 LAB — OCCULT BLOOD X 1 CARD TO LAB, STOOL: FECAL OCCULT BLD: NEGATIVE

## 2016-01-10 LAB — PREPARE RBC (CROSSMATCH)

## 2016-01-10 MED ORDER — FUROSEMIDE 10 MG/ML IJ SOLN
20.0000 mg | Freq: Once | INTRAMUSCULAR | Status: AC
  Administered 2016-01-10: 20 mg via INTRAVENOUS
  Filled 2016-01-10: qty 2

## 2016-01-10 MED ORDER — SODIUM CHLORIDE 0.9 % IV SOLN: Freq: Once | INTRAVENOUS | Status: AC

## 2016-01-10 NOTE — Progress Notes (Signed)
[facility] PROGRESS NOTE  Cassandra [name] ZOX:[id number] DOB: [birth year] DOA: 12/29/2015 PCP: Colette Ribas, MD  Interim Summary:   82 year old female who presented to the hospital with complaints of nausea, vomiting and abdominal pain. She was found to have a possible viral gastroenteritis and was treated supportively. During her hospital stay, she developed significant fevers. Blood cultures were sent and returned positive for MSSA bacteremia. Initially this was suspected to be related to the chronic wound on her foot. MRI of the foot could not rule out underlying osteoarthritis versus abscess. Podiatry was consulted. While at [facility] she had worsening mental status changes for which an MRI of brain was obtained, this study performed on 01/04/2016 revealed multiple scattered acute lacunar infarcts in the brain. Having a positive blood cultures endocarditis strongly suspected for which she was transferred to [facility]. A transesophageal echocardiogram performed on 01/05/2016 revealed a large hypermobile mitral valve vegetation. Source of infection felt to be from left foot osteomyelitis. Orthopedic surgery was consulted as she was taken to the operating room on 01/08/2016 undergoing transmetatarsal amputation of left foot. Cardiothoracic surgery and cardiology consulted during this hospitalization. On 01/09/2016 patient had complained of worsening right flank pain for which a CT scan of abdomen and pelvis was ordered. This study revealed bilateral hydronephrosis without evidence of distal obstruction. Foley catheter was placed with drainage of 1.4 L of urine. CT also revealed splenic infarct likely coming from septic emboli. She remains on nafcillin 2 g IV every 4 hours. With regard to culture data blood cultures obtained on 01/03/2016 and 01/05/2016 both growing methicillin sensitive Staphylococcus aureus. Cultures from 01/06/2016 remain sterile.    Assessment/Plan: 1. Sepsis/Endocarditis. Present on admission. Evidenced by blood cultures from 12/31/2015 growing  MSSA, white count of 25,200, having a temp of 101.7 on admission. Case was discussed with Dr. Ilsa Iha of infectious disease who will consult. She is currently on Nafcillin 2 g IV every 4 hours. With MRI of brain showing multiple scattered infarcts there was concern for septic emboli. She underwent a transesophageal echocardiogram on 01/05/2016 which revealed large hypermobile mitral valve vegetation. Source of bacteremia felt to be left foot osteomyelitis. CT surgery consulted for recommendations on surgical intervention. CT surgery and ID, recommended consulting orthopedic surgery to address left foot infection prior to any cardiothoracic intervention. Repeat blood cultures from 01/06/2016: 1/2 positive for MSSA. On 01/08/2016 she was taken to the OR where she underwent transmetatarsal amputation of left foot osteo.  Repeat blood cultures from 01/06/2016 showing no growth to date. On 01/10/2016 case discussed with Dr.Snider of infectious disease recommending 6 weeks of IV antibiotic therapy.    2. Septic emboli- MRI performed 01/04/2016 showed Multiple (approximately 6) scattered punctate acute lacunar infarcts in the brain. Left MCA, bilateral PCA, and SCA territories are affected. This was further workup with transesophageal echocardiogram that revealed mitral valve vegetation. She is on IV antibiotic therapy with nafcillin. CT surgery consulted to determine if she is a candidate for surgical intervention. From a neurologic standpoint she seems stable.  3. Chronic left foot wound/osteomyelitis . MRI of foot from 01/03/2016 reported by radiology to show possible underlying osteomyelitis, cannot rule out underlying abscess. This was suspected to be source of bacteremia. Podiatry consulted at [facility], she was evaluated by Dr.McKinney.  Wound culture grew MSSA. On 01/07/2016 Case discussed  with Dr Shon Baton of Orthopedic Surgery for evaluation of foot and MRI findings.       On 01/08/2016 she underwent  transmetatarsal amputation of left foot for chronic osteomyelitis.  4.   Splenic infarct. Patient complaining of abdominal pain on 01/09/2016, further worked up with CT scan of abdomen and            pelvis with IV contrast, study revealed splenic infarct involving 30%, likely secondary to septic emboli. Continue IV antibiotics.   4. Urinary retention, CT scan from 01/10/2016 showing bilateral hydronephrosis without evidence of distal obstruction. A Foley catheter was placed with immediate drainage of 1.4 L of urine. Plan to keep for a catheter in place with outpatient follow-up with urology. I suspect this may be the cause of her flank pain. 5. Acute anemia. Lab work on 01/10/2016 showing downward trend in hemoglobin to 7.6 from 9.5 on 01/09/2016. Spoke with nursing staff she has not had bloody or melanotic stools. CT scan of abdomen and pelvis did not show evidence of intra-abdominal bleed. Will guaiac stool. She complains of generalized weakness, fatigue. Given symptomatically anemia she'll be typed and crossed and transfused with 2 units of packed red blood cells. Provide IV Lasix.  6. HTN, blood pressures improved, plan to continue Norvasc 10 mg by mouth daily and Imdur 30 mg by mouth daily 7. HLD, continue statin   Code Status: Full DVT prophylaxis: Heparin Family Communication: I spoke with her daughter and granddaughter who were present at bedside Disposition Plan: PT recommending CIR.    Consultants:  PT - SNF  Infectious Disease  Podiatry  Neurology  Cardiology  Orthopedic surgery  Procedures:  ECHO Study Conclusions- Left ventricle: The cavity size was normal. Systolic function was vigorous. The estimated ejection fraction was in the range of 65% to 70%. Wall motion was normal; there were no regional wall motion abnormalities. Doppler parameters are  consistent with abnormal left ventricular relaxation (grade 1 diastolic dysfunction). There was no evidence of elevated ventricular filling pressure by Doppler parameters. - Aortic valve: Trileaflet; normal thickness leaflets. There was no regurgitation. - Aortic root: The aortic root was normal in size. - Mitral valve: Structurally normal valve. There was mild regurgitation. - Left atrium: The atrium was mildly dilated. - Right ventricle: Systolic function was normal. - Tricuspid valve: There was mild regurgitation. - Pulmonic valve: There was no regurgitation. - Pulmonary arteries: Systolic pressure was moderately increased. PA peak pressure: 48 mm Hg (S). - Inferior vena cava: The vessel was normal in size. - Pericardium, extracardiac: There was no pericardial effusion.  Antibiotics:  Vancomycin 1/29>>1/31  Cefazolin 1/29>> 2/1  Nafcillin 2/1>>  HPI/Subjective: He reports feeling better compared to yesterday I gently after placement of Foley catheter. She continues to feel weak. Denies fevers chills nausea or vomiting  Objective: Filed Vitals:   01/10/16 0528 01/10/16 0924  BP: 134/67 157/67  Pulse: 97 104  Temp: 97.1 F (36.2 C)   Resp: 18 16    Intake/Output Summary (Last 24 hours) at 01/10/16 1539 Last data filed at 01/10/16 1212  Gross per 24 hour  Intake 1565.51 ml  Output   1950 ml  Net -384.49 ml   Filed Weights   12/29/15 0759 12/29/15 1957  Weight: 80.74 kg (178 lb) 81.2 kg (179 lb 0.2 oz)    Exam: General: NAD. She is awake and alert. Sitting at bedside chair, pale appearing Cardiovascular: Regular rate and rhythm. I/VI systolic murmur heard best at aortic and pulmonic area  Respiratory: CTAB. No wheezes, rales, or rhonchi.   Abdomen: soft, non tender, no distention, bowel sounds normal Musculoskeletal: She has 1+ bilateral  lower extremity pitting edema, status post transmetatarsal amputation to left foot. Foot is bandaged.   Data  Reviewed: Basic Metabolic Panel:  Recent Labs Lab 01/06/16 0542 01/07/16 0909 01/08/16 0540 01/09/16 0520 01/10/16 0553  NA 143 142 144 139 135  K 3.4* 2.9* 3.7 3.2* 3.7  CL 108 106 111 106 102  CO2 23 22 25 24 23   GLUCOSE 132* 115* 125* 151* 106*  BUN 45* 31* 26* 22* 14  CREATININE 1.03* 0.96 0.87 0.98 0.85  CALCIUM 8.3* 8.2* 7.8* 7.9* 7.6*   Liver Function Tests: No results for input(s): AST, ALT, ALKPHOS, BILITOT, PROT, ALBUMIN in the last 168 hours. No results for input(s): LIPASE, AMYLASE in the last 168 hours. CBC:  Recent Labs Lab 01/04/16 0542 01/07/16 0909 01/08/16 0540 01/09/16 0520 01/10/16 0553  WBC 25.2* 20.8* 19.7* 25.1* 17.8*  HGB 12.7 10.8* 10.1* 9.5* 7.6*  HCT 37.3 32.5* 29.9* 28.7* 23.5*  MCV 88.8 90.8 91.4 92.0 91.1  PLT 204 299 330 375 355   Cardiac Enzymes:  Recent Labs Lab 01/06/16 0734 01/06/16 1351 01/06/16 2012  TROPONINI 0.08* 0.13* 0.10*     Recent Results (from the past 240 hour(s))  Culture, blood (Routine X 2) w Reflex to ID Panel     Status: None   Collection Time: 01/02/16  2:11 PM  Result Value Ref Range Status   Specimen Description BLOOD RIGHT ANTECUBITAL  Final   Special Requests   Final    BOTTLES DRAWN AEROBIC AND ANAEROBIC 6CC EACH  IMMUNE:COMPROMISED   Culture NO GROWTH 6 DAYS  Final   Report Status 01/08/2016 FINAL  Final  Culture, blood (Routine X 2) w Reflex to ID Panel     Status: None   Collection Time: 01/02/16  2:13 PM  Result Value Ref Range Status   Specimen Description BLOOD RIGHT HAND  Final   Special Requests   Final    BOTTLES DRAWN AEROBIC AND ANAEROBIC 6CC  IMMUNE:COMPROMISED   Culture NO GROWTH 6 DAYS  Final   Report Status 01/08/2016 FINAL  Final  Wound culture     Status: None   Collection Time: 01/03/16  6:43 PM  Result Value Ref Range Status   Specimen Description WOUND LEFT FOOT  Final   Special Requests NONE  Final   Gram Stain   Final    NO WBC SEEN NO SQUAMOUS EPITHELIAL CELLS  SEEN NO ORGANISMS SEEN Performed at [facility]    Culture   Final    MODERATE STAPHYLOCOCCUS AUREUS Note: RIFAMPIN AND GENTAMICIN SHOULD NOT BE USED AS SINGLE DRUGS FOR TREATMENT OF STAPH INFECTIONS. Performed at [facility]    Report Status 01/06/2016 FINAL  Final   Organism ID, Bacteria STAPHYLOCOCCUS AUREUS  Final      Susceptibility   Staphylococcus aureus - MIC*    CLINDAMYCIN <=0.25 SENSITIVE Sensitive     ERYTHROMYCIN <=0.25 SENSITIVE Sensitive     GENTAMICIN <=0.5 SENSITIVE Sensitive     LEVOFLOXACIN <=0.12 SENSITIVE Sensitive     OXACILLIN <=0.25 SENSITIVE Sensitive     RIFAMPIN <=0.5 SENSITIVE Sensitive     TRIMETH/SULFA <=10 SENSITIVE Sensitive     VANCOMYCIN 1 SENSITIVE Sensitive     TETRACYCLINE <=1 SENSITIVE Sensitive     MOXIFLOXACIN <=0.25 SENSITIVE Sensitive     * MODERATE STAPHYLOCOCCUS AUREUS  Culture, blood (routine x 2)     Status: None   Collection Time: 01/05/16  8:06 PM  Result Value Ref Range Status   Specimen  Description BLOOD RIGHT ARM  Final   Special Requests IN PEDIATRIC BOTTLE 4CC  Final   Culture  Setup Time   Final    GRAM POSITIVE COCCI IN CLUSTERS AEROBIC BOTTLE ONLY CRITICAL RESULT CALLED TO, READ BACK BY AND VERIFIED WITH: J EDWARDS RN 1556 01/06/16 A BROWNING    Culture STAPHYLOCOCCUS AUREUS  Final   Report Status 01/08/2016 FINAL  Final   Organism ID, Bacteria STAPHYLOCOCCUS AUREUS  Final      Susceptibility   Staphylococcus aureus - MIC*    CIPROFLOXACIN <=0.5 SENSITIVE Sensitive     ERYTHROMYCIN <=0.25 SENSITIVE Sensitive     GENTAMICIN <=0.5 SENSITIVE Sensitive     OXACILLIN 0.5 SENSITIVE Sensitive     TETRACYCLINE <=1 SENSITIVE Sensitive     VANCOMYCIN <=0.5 SENSITIVE Sensitive     TRIMETH/SULFA <=10 SENSITIVE Sensitive     CLINDAMYCIN <=0.25 SENSITIVE Sensitive     RIFAMPIN <=0.5 SENSITIVE Sensitive     Inducible Clindamycin NEGATIVE Sensitive     * STAPHYLOCOCCUS AUREUS  Culture, blood (routine x 2)      Status: None   Collection Time: 01/05/16  8:08 PM  Result Value Ref Range Status   Specimen Description BLOOD RIGHT HAND  Final   Special Requests BOTTLES DRAWN AEROBIC ONLY 9CC  Final   Culture NO GROWTH 5 DAYS  Final   Report Status 01/10/2016 FINAL  Final  Culture, blood (routine x 2)     Status: None (Preliminary result)   Collection Time: 01/06/16  4:55 PM  Result Value Ref Range Status   Specimen Description BLOOD LEFT ANTECUBITAL  Final   Special Requests BOTTLES DRAWN AEROBIC AND ANAEROBIC 10CC  Final   Culture NO GROWTH 4 DAYS  Final   Report Status PENDING  Incomplete  Culture, blood (routine x 2)     Status: None (Preliminary result)   Collection Time: 01/06/16  4:59 PM  Result Value Ref Range Status   Specimen Description BLOOD BLOOD LEFT HAND  Final   Special Requests BOTTLES DRAWN AEROBIC AND ANAEROBIC 10CC  Final   Culture NO GROWTH 4 DAYS  Final   Report Status PENDING  Incomplete  Surgical PCR screen     Status: Abnormal   Collection Time: 01/08/16  2:44 PM  Result Value Ref Range Status   MRSA, PCR NEGATIVE NEGATIVE Final   Staphylococcus aureus POSITIVE (A) NEGATIVE Final    Comment:        The Xpert SA Assay (FDA approved for NASAL specimens in patients over 31 years of age), is one component of a comprehensive surveillance program.  Test performance has been validated by [facility] for patients greater than or equal to 49 year old. It is not intended to diagnose infection nor to guide or monitor treatment.   C difficile quick scan w PCR reflex     Status: None   Collection Time: 01/08/16  3:45 PM  Result Value Ref Range Status   C Diff antigen NEGATIVE NEGATIVE Final   C Diff toxin NEGATIVE NEGATIVE Final   C Diff interpretation Negative for toxigenic C. difficile  Final     Studies: Mr Lodema Pilot Contrast  01-08-16  CLINICAL DATA:  82 year old female with altered mental status, not responding. Initial encounter. EXAM: MRI HEAD WITHOUT AND WITH  CONTRAST TECHNIQUE: Multiplanar, multiecho pulse sequences of the brain and surrounding structures were obtained without and with intravenous contrast. CONTRAST:  16mL MULTIHANCE GADOBENATE DIMEGLUMINE 529 MG/ML IV SOLN COMPARISON:  Head CT  without contrast 06/14/2014. Brain MRI, MRA a 03/13/2012. FINDINGS: About half a dozen punctate foci of restricted diffusion are demonstrated scattered in the bilateral hemispheres and superior cerebellum. One of these areas is in the medial right occipital lobe PCA territory where there is suggestion of mild underlying cortical encephalomalacia (series 102, image 19). Left MCA, bilateral PCA, and superior cerebellar artery territories are affected. No acute intracranial hemorrhage identified. No midline shift, mass effect, or evidence of intracranial mass lesion. Major intracranial vascular flow voids are stable. Minimal to mild generalized cerebral volume loss since 2013. No ventriculomegaly, extra-axial collection or acute intracranial hemorrhage. Cervicomedullary junction and pituitary are within normal limits. Negative visualized cervical spine. Patchy and scattered bilateral cerebral white matter T2 and FLAIR hyperintensity is stable. No abnormal enhancement identified. Visible internal auditory structures appear normal. Mild chronic right mastoid effusion is unchanged. Nasopharynx remains normal. Trace paranasal sinus mucosal thickening. Chronic postoperative changes to both globes. Negative scalp soft tissues. IMPRESSION: 1. 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 Left MCA, bilateral PCA, and SCA territories are affected. Therefore, consider recent embolic event from the heart or proximal aorta (less likely posterior circulation only thromboembolism). 2. No associated hemorrhage or mass effect. 3. Otherwise no significant change in the MRI appearance of the brain since 2013. Electronically Signed   By: Odessa Fleming M.D.   On:  01/04/2016 09:49   Mr Foot Left Wo Contrast  01/03/2016  CLINICAL DATA:  Bacteremia. Open wounds on the left foot with history of amputation. EXAM: MRI OF THE LEFT FOREFOOT WITHOUT CONTRAST TECHNIQUE: Multiplanar, multisequence MR imaging was performed. No intravenous contrast was administered. COMPARISON:  01/02/2016; 09/15/2014 FINDINGS: The patient refused IV contrast. Despite efforts by the technologist and patient, severe motion artifact is present on today's exam and could not be eliminated. This reduces exam sensitivity and specificity. First digit amputation at the MTP joint, with a approximately 1.5 by 0.8 by 0.8 cm fluid collection just plantar to the head of the first metatarsal as shown on images 27-32 of series 10, and surrounding abnormal inflammatory effacement of the adipose tissues. Abscess not excluded. Low signal intensity focus on image 33 series 9 possibly a tiny microscopic metallic particle or tiny focus of gas along the amputation margin. As on conventional radiographs, we demonstrate erosive findings in the head of the second and third metatarsals as well as in the bases of the proximal phalanges of the second and third toes. There is some low-level increased T2 signal along the third metatarsal head as on image 27 series 10 Low-level edema tracks along the plantar musculature of the foot. There is some mild subcutaneous edema dorsally in the foot. Degenerative findings along the Lisfranc joint. No Lisfranc joint malalignment identified. IMPRESSION: 1. Erosive findings at the second and third metatarsophalangeal joints are confirmed. This could represent infection, gout, or rheumatoid arthropathy. Particularly in the head of the third metatarsal, osteomyelitis is of greater suspicion. Today's images are severely degraded by motion artifact, and the patient refused IV contrast. 2. Fluid collection plantar to the head of the first metatarsal -abscess not excluded given the inflammatory  effacement of the surrounding fatty subcutaneous tissues. Electronically Signed   By: Gaylyn Rong M.D.   On: 01/03/2016 08:41    Scheduled Meds: .  stroke: mapping our early stages of recovery book   Does not apply Once  . sodium chloride   Intravenous Once  . amLODipine  10 mg Oral Daily  . antiseptic  oral rinse  7 mL Mouth Rinse BID  . aspirin  81 mg Oral Daily  . brinzolamide  1 drop Both Eyes TID   And  . brimonidine  1 drop Both Eyes TID  . calcium-vitamin D  1 tablet Oral Daily  . Chlorhexidine Gluconate Cloth  6 each Topical Daily  . famotidine  20 mg Oral Daily  . feeding supplement (ENSURE ENLIVE)  237 mL Oral BID BM  . furosemide  20 mg Intravenous Once  . furosemide  20 mg Intravenous Once  . isosorbide mononitrate  30 mg Oral Daily  . latanoprost  1 drop Both Eyes QHS  . metoCLOPramide (REGLAN) injection  10 mg Intravenous 4 times per day  . mupirocin ointment  1 application Nasal BID  . nafcillin IV  2 g Intravenous Q4H  . nystatin  5 mL Oral QID  . pantoprazole  40 mg Oral BID  . sertraline  100 mg Oral Daily  . simvastatin  10 mg Oral QHS   Continuous Infusions: . sodium chloride 10 mL/hr at 01/10/16 1610    Principal Problem:   Sepsis (HCC) Active Problems:   Hypertension   Hyperlipidemia   Gastroenteritis   Glaucoma   Staphylococcus aureus bacteremia   Diminished pulses in lower extremity   Emesis   Cerebral embolism with cerebral infarction   Bacterial endocarditis   Chronic osteomyelitis of left foot (HCC)   Chest tightness   Chest pain   Shortness of breath   Loose stools   Time spent: 35 minutes   Mahala Menghini MD  Resident Physician [facility] 01/10/2016, 3:39 PM  LOS: 9 days

## 2016-01-10 NOTE — Progress Notes (Signed)
Physical Therapy Treatment Patient Details Name: Cassandra [name] MRN: [id number] DOB: 1933/10/16 Today's Date: 01/10/2016    History of Present Illness 82yo white female who presents to [facility] on 12/29/15 after 2 days of N/V, R flank pain, and generalized weakness. Pt admitted for Gastroenteritis after workup. At baseline, pt reports she is a fully indep community ambulator without AD. PMH: HTN, HLD, and L hallux amputation 1ya. During past few days patient has been increasingly lethargic and somnolent. MRI findings triggered podiatry consult on 1/31, followed by debridement and new NWB status on LLE. MRI also performed secondary to acute encephlaopathy/AMS and revealed multiple acattered punctuate acute lunar infarcts. Pt was transferred from [facility] on 01/04/16.  Pt then underwent a L transmetatarsal amputation on the L and L percutaneous achilles tendon lengthening.     PT Comments    Pt performed transfer ( squat pivot from bed to recliner with drop arm chair) +2 max assist.  Pt required education for technique and to maintain NWB.    Follow Up Recommendations  CIR     Equipment Recommendations  None recommended by PT    Recommendations for Other Services       Precautions / Restrictions Precautions Precautions: Fall Precaution Comments: L foot infection now s/p L transmetatarsal amputation with achilles lengthening. / NWB Restrictions LLE Weight Bearing: Non weight bearing    Mobility  Bed Mobility Overal bed mobility: Needs Assistance;+2 for physical assistance Bed Mobility: Rolling;Supine to Sit     Supine to sit: Mod assist;+2 for physical assistance;+2 for safety/equipment     General bed mobility comments: Patient performed supine to sit with +2 assist requiring use of draw pad to advance patient to edge of bed.  Pt demonstrated guarding of R flank/hip secondary to pain which limits forward flexion during transfer activities.  Pt sits posterior for comfort.     Transfers Overall transfer level: Needs assistance Equipment used: None Transfers: Squat Pivot Transfers     Squat pivot transfers: +2 physical assistance;Max assist    Lateral/Scoot Transfers: Max assist;From elevated surface General transfer comment: Required multiple demonstration pre transfer to gain patients trust for transfer to chair.  Pt required max VCs for forward weight shifting to improve ease of transfer.  Pt reports increased comfort in R flank post transfer.    Ambulation/Gait Ambulation/Gait assistance:  (Pt remains unable for gt training secondary to weakness during transfers.)               Stairs            Wheelchair Mobility    Modified Rankin (Stroke Patients Only)       Balance Overall balance assessment: Needs assistance Sitting-balance support: Feet supported Sitting balance-Leahy Scale: Fair Sitting balance - Comments: pt able to sit on EOB without physical assist but no challenges given.     Standing balance-Leahy Scale: Zero Standing balance comment: requires +2 max assist for partial stand during squat pivot.                      Cognition Arousal/Alertness: Awake/alert Behavior During Therapy: Anxious Overall Cognitive Status: Within Functional Limits for tasks assessed Area of Impairment: Safety/judgement;Memory;Attention;Problem solving     Memory: Decreased short-term memory Following Commands: Follows multi-step commands with increased time Safety/Judgement: Decreased awareness of safety   Problem Solving: Slow processing;Decreased initiation;Difficulty sequencing;Requires verbal cues;Requires tactile cues      Exercises      General Comments  Pertinent Vitals/Pain Pain Score: 8  Pain Location: R flank Pain Descriptors / Indicators: Guarding;Grimacing;Tender Pain Intervention(s): Monitored during session    Home Living                      Prior Function            PT Goals  (current goals can now be found in the care plan section) Acute Rehab PT Goals Potential to Achieve Goals: Good Progress towards PT goals: Progressing toward goals (slow but demonstrated improved ease during transfer however physical assist remains needed for mobility.  )    Frequency  Min 3X/week    PT Plan Current plan remains appropriate    Co-evaluation             End of Session Equipment Utilized During Treatment: Gait belt Activity Tolerance: Patient limited by fatigue Patient left: in bed;with call bell/phone within reach;with bed alarm set     Time: 0454-0981 PT Time Calculation (min) (ACUTE ONLY): 29 min  Charges:  $Therapeutic Activity: 23-37 mins                    G Codes:      Florestine Avers 2016/02/06, 9:10 AM  Joycelyn Rua, PTA pager [phone]

## 2016-01-10 NOTE — Progress Notes (Signed)
Subjective: 2 Days Post-Op Procedure(s) (LRB): AMPUTATION TRANSMETATARSAL LEFT FOOT and achilles tendon lengthening (Left) Patient reports pain as mild.  She denies any pain from the foot.  She has minor c/o pain elsewhere per the RN.  Objective: Vital signs in last 24 hours: Temp:  [97.1 F (36.2 C)-98.7 F (37.1 C)] 98.3 F (36.8 C) (02/07 1618) Pulse Rate:  [97-104] 97 (02/07 1618) Resp:  [16-18] 18 (02/07 1618) BP: (134-157)/(57-76) 135/57 mmHg (02/07 1618) SpO2:  [95 %-97 %] 97 % (02/07 1618)  Intake/Output from previous day: 02/06 0701 - 02/07 0700 In: 1355.8 [P.O.:480; I.V.:575.8; IV Piggyback:300] Out: 1400 [Urine:1400] Intake/Output this shift: Total I/O In: 1278.8 [P.O.:290; I.V.:688.8; IV Piggyback:300] Out: 550 [Urine:550]   Recent Labs  01/08/16 0540 01/09/16 0520 01/10/16 0553  HGB 10.1* 9.5* 7.6*    Recent Labs  01/09/16 0520 01/10/16 0553  WBC 25.1* 17.8*  RBC 3.12* 2.58*  HCT 28.7* 23.5*  PLT 375 355    Recent Labs  01/09/16 0520 01/10/16 0553  NA 139 135  K 3.2* 3.7  CL 106 102  CO2 24 23  BUN 22* 14  CREATININE 0.98 0.85  GLUCOSE 151* 106*  CALCIUM 7.9* 7.6*   No results for input(s): LABPT, INR in the last 72 hours.  PE:  L foot splinted.  CDI.  Assessment/Plan: 2 Days Post-Op Procedure(s) (LRB): AMPUTATION TRANSMETATARSAL LEFT FOOT and achilles tendon lengthening (Left) Up with therapy  NWB on the L LE.  Cassandra [name] 01/10/2016, 4:43 PM

## 2016-01-10 NOTE — Progress Notes (Signed)
Speech Language Pathology Treatment: Cognitive-Linquistic  Patient Details Name: Cassandra [name] MRN: [id number] DOB: 14-Feb-1933 Today's Date: 01/10/2016 Time: 0940-1000 SLP Time Calculation (min) (ACUTE ONLY): 20 min  Assessment / Plan / Recommendation Clinical Impression  Pt was seen for skilled ST targeting cognitive-linguistic goals.  Pt was seated upright in recliner with family at bedside.  Pt and family both report pt to be returned to baseline other than occasional word finding deficits in the setting of lethargy due to medication effects.  Pt was able to describe a familiar recipe in detail, including all ingredients and procedures, with mod I and extra time.  Pt's son verified information from recipe to be 100% accurate and complete.  Pt reports concerns about difficulty spelling "certain words."  SLP provided skilled instruction regarding copy and recall method to improve accuracy for spelling.  Pt's daughter reported that pt was independent prior to admission and managing her household finances.  SLP recommended that pt have assistance for financial management which pt's daughter reported that family would be able to provide.  Pt and family in agreement with no further ST services indicated at this time as pt appears to have largely returned to her baseline.     HPI HPI: 82 y.o. female with h/o HTN, arthritis, hyperlipidemia, left leg chronic ulcer. Pt presented with c/o generalized weakness, right flank pain, poor appetite, fever, nausea, vomiting, diarrhea over 48 hour period, indicative of gastroenteritis, dehyrdation. MRI Brain 2/1 multiple (~6) scattered punctuate acute lacunar infarcts in brain, possible recent embolic event from heart or proximal aorta. Left MCA, bilateral PCA and SCA are affected. Vascular congestion with bilateral perihilar and lower lobe opacities, right greater than left, asymmetric edema or infection.      SLP Plan  Discharge SLP treatment due to (comment) (pt  at baseline for cognitive-linguistic and swallowing fx)     Recommendations                Follow up Recommendations: Inpatient Rehab Plan: Discharge SLP treatment due to (comment) (pt at baseline for cognitive-linguistic and swallowing fx)     GO                Cassandra [name], Cassandra [name] L 01/10/2016, 10:14 AM

## 2016-01-10 NOTE — Progress Notes (Signed)
CT abd results back and show severe right sided hydronephrosis without obstruction or stone. Likely, due to chronically distended bladder. Also, acute infarction of spleen about 30% affected. CT was done today due to pt's continued c/o right sided flank pain.  Discussed results of test with Dr. Julian Reil. Will place foley catheter to decompress bladder, measure intake and output, recheck BMP (creatinine has been normal) and CBC and report off to attending in am. Per Dr. Julian Reil, no need to urgently call specialist tonight.  KJKG, NP [facility]

## 2016-01-10 NOTE — Progress Notes (Signed)
Dr. Victorino Dike verbal order okay to change ACE wrap to left leg r/t patient getting bowel movement on dressing.

## 2016-01-10 NOTE — Progress Notes (Signed)
Subjective:  Afebrile, under abd/pelvic CT due to right hip pain  Interval hx: CT findings showed new splenic infarct. Bilateral hydronephrosis R > L due to chronic distended bladder. Foley placed   Antibiotics:  Anti-infectives    Start     Dose/Rate Route Frequency Ordered Stop   01/04/16 0200  nafcillin 2 g in dextrose 5 % 100 mL IVPB     2 g 200 mL/hr over 30 Minutes Intravenous Every 4 hours 01/04/16 0151     01/04/16 0045  nafcillin injection 2 g  Status:  Discontinued     2 g Intravenous Every 4 hours 01/04/16 0011 01/04/16 0151   01/01/16 2300  vancomycin (VANCOCIN) IVPB 750 mg/150 ml premix  Status:  Discontinued     750 mg 150 mL/hr over 60 Minutes Intravenous Every 12 hours 01/01/16 1042 01/03/16 1209   01/01/16 1400  ceFAZolin (ANCEF) IVPB 2 g/50 mL premix  Status:  Discontinued     2 g 100 mL/hr over 30 Minutes Intravenous 3 times per day 01/01/16 1041 01/04/16 0011   01/01/16 1200  vancomycin (VANCOCIN) 1,500 mg in sodium chloride 0.9 % 500 mL IVPB     1,500 mg 250 mL/hr over 120 Minutes Intravenous  Once 01/01/16 1041 01/01/16 1512      Medications: Scheduled Meds: .  stroke: mapping our early stages of recovery book   Does not apply Once  . amLODipine  10 mg Oral Daily  . antiseptic oral rinse  7 mL Mouth Rinse BID  . aspirin  81 mg Oral Daily  . brinzolamide  1 drop Both Eyes TID   And  . brimonidine  1 drop Both Eyes TID  . calcium-vitamin D  1 tablet Oral Daily  . Chlorhexidine Gluconate Cloth  6 each Topical Daily  . famotidine  20 mg Oral Daily  . feeding supplement (ENSURE ENLIVE)  237 mL Oral BID BM  . isosorbide mononitrate  30 mg Oral Daily  . latanoprost  1 drop Both Eyes QHS  . metoCLOPramide (REGLAN) injection  10 mg Intravenous 4 times per day  . mupirocin ointment  1 application Nasal BID  . nafcillin IV  2 g Intravenous Q4H  . nystatin  5 mL Oral QID  . pantoprazole  40 mg Oral BID  . sertraline  100 mg Oral Daily  .  simvastatin  10 mg Oral QHS   Objective: Weight change:   Intake/Output Summary (Last 24 hours) at 01/10/16 0932 Last data filed at 01/10/16 0738  Gross per 24 hour  Intake 2261.01 ml  Output   1400 ml  Net 861.01 ml   Blood pressure 157/67, pulse 104, temperature 97.1 F (36.2 C), temperature source Oral, resp. rate 16, height 5' 9.5" (1.765 m), weight 179 lb 0.2 oz (81.2 kg), SpO2 97 %. Temp:  [97.1 F (36.2 C)-98.7 F (37.1 C)] 97.1 F (36.2 C) (02/07 0528) Pulse Rate:  [97-113] 104 (02/07 0924) Resp:  [16-18] 16 (02/07 0924) BP: (133-157)/(67-81) 157/67 mmHg (02/07 0924) SpO2:  [95 %-97 %] 97 % (02/07 0924)  Physical Exam: General: Alert and awake, oriented, answers questions appropriately. Sitting  Up in chair HEENT:  EOMI Abd: scattered echymosis from lovenox injection Ext: Left foot/leg wrapped from surgery.  Neuro: nonfocal  CBC:  CBC Latest Ref Rng 01/10/2016 01/09/2016 01/08/2016  WBC 4.0 - 10.5 K/uL 17.8(H) 25.1(H) 19.7(H)  Hemoglobin 12.0 - 15.0 g/dL 7.6(L) 9.5(L) 10.1(L)  Hematocrit 36.0 - 46.0 %  23.5(L) 28.7(L) 29.9(L)  Platelets 150 - 400 K/uL 355 375 330      BMET  Recent Labs  01/09/16 0520 01/10/16 0553  NA 139 135  K 3.2* 3.7  CL 106 102  CO2 24 23  GLUCOSE 151* 106*  BUN 22* 14  CREATININE 0.98 0.85  CALCIUM 7.9* 7.6*   Lab Results  Component Value Date   ESRSEDRATE 75* 01/03/2016   Lab Results  Component Value Date   CRP 24.0* 01/03/2016    Micro Results: Recent Results (from the past 720 hour(s))  C difficile quick scan w PCR reflex     Status: None   Collection Time: 12/30/15 12:28 AM  Result Value Ref Range Status   C Diff antigen NEGATIVE NEGATIVE Final   C Diff toxin NEGATIVE NEGATIVE Final   C Diff interpretation Negative for toxigenic C. difficile  Final    Comment: NEGATIVE  Culture, blood (routine x 2)     Status: None   Collection Time: 12/31/15 12:16 PM  Result Value Ref Range Status   Specimen Description BLOOD  RIGHT ARM  Final   Special Requests BOTTLES DRAWN AEROBIC AND ANAEROBIC 6CC  Final   Culture  Setup Time   Final    GRAM POSITIVE COCCI IN CLUSTERS IN BOTH AEROBIC AND ANAEROBIC BOTTLES CRITICAL RESULT CALLED TO, READ BACK BY AND VERIFIED WITH: Carnella Guadalajara, J. AT 0035 ON 01/01/2016 BY AGUNDIZ,E.    Culture   Final    STAPHYLOCOCCUS AUREUS Performed at [facility]    Report Status 01/03/2016 FINAL  Final   Organism ID, Bacteria STAPHYLOCOCCUS AUREUS  Final      Susceptibility   Staphylococcus aureus - MIC*    CIPROFLOXACIN <=0.5 SENSITIVE Sensitive     ERYTHROMYCIN <=0.25 SENSITIVE Sensitive     GENTAMICIN <=0.5 SENSITIVE Sensitive     OXACILLIN <=0.25 SENSITIVE Sensitive     TETRACYCLINE <=1 SENSITIVE Sensitive     VANCOMYCIN <=0.5 SENSITIVE Sensitive     TRIMETH/SULFA <=10 SENSITIVE Sensitive     CLINDAMYCIN <=0.25 SENSITIVE Sensitive     RIFAMPIN <=0.5 SENSITIVE Sensitive     Inducible Clindamycin NEGATIVE Sensitive     * STAPHYLOCOCCUS AUREUS  Culture, blood (routine x 2)     Status: None   Collection Time: 12/31/15 12:33 PM  Result Value Ref Range Status   Specimen Description BLOOD RIGHT ARM  Final   Special Requests BOTTLES DRAWN AEROBIC AND ANAEROBIC 6CC  Final   Culture  Setup Time   Final    GRAM POSITIVE COCCI IN CLUSTERS IN BOTH AEROBIC AND ANAEROBIC BOTTLES CRITICAL RESULT CALLED TO, READ BACK BY AND VERIFIED WITH: LAVINDER,J. AT 1610 ON 01/01/2016 BY AGUNDIZ,E.    Culture   Final    STAPHYLOCOCCUS AUREUS SUSCEPTIBILITIES PERFORMED ON PREVIOUS CULTURE WITHIN THE LAST 5 DAYS. Performed at [facility]    Report Status 01/03/2016 FINAL  Final  Culture, blood (Routine X 2) w Reflex to ID Panel     Status: None   Collection Time: 01/02/16  2:11 PM  Result Value Ref Range Status   Specimen Description BLOOD RIGHT ANTECUBITAL  Final   Special Requests   Final    BOTTLES DRAWN AEROBIC AND ANAEROBIC 6CC EACH  IMMUNE:COMPROMISED   Culture NO GROWTH 6  DAYS  Final   Report Status 01/08/2016 FINAL  Final  Culture, blood (Routine X 2) w Reflex to ID Panel     Status: None   Collection Time: 01/02/16  2:13  PM  Result Value Ref Range Status   Specimen Description BLOOD RIGHT HAND  Final   Special Requests   Final    BOTTLES DRAWN AEROBIC AND ANAEROBIC 6CC  IMMUNE:COMPROMISED   Culture NO GROWTH 6 DAYS  Final   Report Status 01/08/2016 FINAL  Final  Wound culture     Status: None   Collection Time: 01/03/16  6:43 PM  Result Value Ref Range Status   Specimen Description WOUND LEFT FOOT  Final   Special Requests NONE  Final   Gram Stain   Final    NO WBC SEEN NO SQUAMOUS EPITHELIAL CELLS SEEN NO ORGANISMS SEEN Performed at [facility]    Culture   Final    MODERATE STAPHYLOCOCCUS AUREUS Note: RIFAMPIN AND GENTAMICIN SHOULD NOT BE USED AS SINGLE DRUGS FOR TREATMENT OF STAPH INFECTIONS. Performed at [facility]    Report Status 01/06/2016 FINAL  Final   Organism ID, Bacteria STAPHYLOCOCCUS AUREUS  Final      Susceptibility   Staphylococcus aureus - MIC*    CLINDAMYCIN <=0.25 SENSITIVE Sensitive     ERYTHROMYCIN <=0.25 SENSITIVE Sensitive     GENTAMICIN <=0.5 SENSITIVE Sensitive     LEVOFLOXACIN <=0.12 SENSITIVE Sensitive     OXACILLIN <=0.25 SENSITIVE Sensitive     RIFAMPIN <=0.5 SENSITIVE Sensitive     TRIMETH/SULFA <=10 SENSITIVE Sensitive     VANCOMYCIN 1 SENSITIVE Sensitive     TETRACYCLINE <=1 SENSITIVE Sensitive     MOXIFLOXACIN <=0.25 SENSITIVE Sensitive     * MODERATE STAPHYLOCOCCUS AUREUS  Culture, blood (routine x 2)     Status: None   Collection Time: 01/05/16  8:06 PM  Result Value Ref Range Status   Specimen Description BLOOD RIGHT ARM  Final   Special Requests IN PEDIATRIC BOTTLE 4CC  Final   Culture  Setup Time   Final    GRAM POSITIVE COCCI IN CLUSTERS AEROBIC BOTTLE ONLY CRITICAL RESULT CALLED TO, READ BACK BY AND VERIFIED WITH: J EDWARDS RN 1556 01/06/16 A BROWNING    Culture  STAPHYLOCOCCUS AUREUS  Final   Report Status 01/08/2016 FINAL  Final   Organism ID, Bacteria STAPHYLOCOCCUS AUREUS  Final      Susceptibility   Staphylococcus aureus - MIC*    CIPROFLOXACIN <=0.5 SENSITIVE Sensitive     ERYTHROMYCIN <=0.25 SENSITIVE Sensitive     GENTAMICIN <=0.5 SENSITIVE Sensitive     OXACILLIN 0.5 SENSITIVE Sensitive     TETRACYCLINE <=1 SENSITIVE Sensitive     VANCOMYCIN <=0.5 SENSITIVE Sensitive     TRIMETH/SULFA <=10 SENSITIVE Sensitive     CLINDAMYCIN <=0.25 SENSITIVE Sensitive     RIFAMPIN <=0.5 SENSITIVE Sensitive     Inducible Clindamycin NEGATIVE Sensitive     * STAPHYLOCOCCUS AUREUS  Culture, blood (routine x 2)     Status: None (Preliminary result)   Collection Time: 01/05/16  8:08 PM  Result Value Ref Range Status   Specimen Description BLOOD RIGHT HAND  Final   Special Requests BOTTLES DRAWN AEROBIC ONLY 9CC  Final   Culture NO GROWTH 4 DAYS  Final   Report Status PENDING  Incomplete  Culture, blood (routine x 2)     Status: None (Preliminary result)   Collection Time: 01/06/16  4:55 PM  Result Value Ref Range Status   Specimen Description BLOOD LEFT ANTECUBITAL  Final   Special Requests BOTTLES DRAWN AEROBIC AND ANAEROBIC 10CC  Final   Culture NO GROWTH 3 DAYS  Final   Report Status PENDING  Incomplete  Culture, blood (routine x 2)     Status: None (Preliminary result)   Collection Time: 01/06/16  4:59 PM  Result Value Ref Range Status   Specimen Description BLOOD BLOOD LEFT HAND  Final   Special Requests BOTTLES DRAWN AEROBIC AND ANAEROBIC 10CC  Final   Culture NO GROWTH 3 DAYS  Final   Report Status PENDING  Incomplete  Surgical PCR screen     Status: Abnormal   Collection Time: 01/08/16  2:44 PM  Result Value Ref Range Status   MRSA, PCR NEGATIVE NEGATIVE Final   Staphylococcus aureus POSITIVE (A) NEGATIVE Final    Comment:        The Xpert SA Assay (FDA approved for NASAL specimens in patients over 48 years of age), is one component  of a comprehensive surveillance program.  Test performance has been validated by [facility] for patients greater than or equal to 41 year old. It is not intended to diagnose infection nor to guide or monitor treatment.   C difficile quick scan w PCR reflex     Status: None   Collection Time: 01/08/16  3:45 PM  Result Value Ref Range Status   C Diff antigen NEGATIVE NEGATIVE Final   C Diff toxin NEGATIVE NEGATIVE Final   C Diff interpretation Negative for toxigenic C. difficile  Final   Assessment/Plan:   Principal Problem:   Sepsis (HCC) Active Problems:   Hypertension   Hyperlipidemia   Gastroenteritis   Glaucoma   Staphylococcus aureus bacteremia   Diminished pulses in lower extremity   Emesis   Cerebral embolism with cerebral infarction   Bacterial endocarditis   Chronic osteomyelitis of left foot (HCC)   Chest tightness   Chest pain   Shortness of breath   Loose stools    Cassandra [name] is a 81 y.o. female with MSSA Native mitral valve endocarditis with CNS emboli & splenic infarct with source of her infection likely being chronic osteomyelitis of left foot s/p TM amputation on 2/5  for source control. Her vegetation is very long but not wide diameter. She has intermittent bacteremia despite being on nafcillin thought to be due either her foot being constant source of infection vs. Her vegetation.   #1 Methicillin sensitive staph coccus aureus native valve endocarditis with large lengthwise vegetation with CNS emboli, splenic infarct and osteomyelitis of her left foot s/p TM amputation, had prolonged bacteremia up until amputation of affected foot  --continue Nafcillin for CNS penetration -- repeat blood cx on 2/3 remains NGTD --Greatly appreciate cardiology cardiothoracic surgery orthopedic surgery, Neurology  and the primary team following the patient very closely. - plan to treat for 6 wks using 2/3 as day 1 of 42. If she does not tolerate nafcillin for the  whole course, we can change to other treatment option - recommend to touch base with CT surgery for follow up and confirmation to whether she is a surgical candidate vs. Medical management alone  #2 leukocytosis = appears improving  #3 right hip pain = found to be possibly related to large hydronephrosis R>L found on CT last night. Continue to monitor for improvement now that she has foley catheter  #4 encephalopathy = improved. Appears back to her baseline. Appropriate, no sundowning noticed since sunday     LOS: 9 days   Ajit Errico 01/10/2016, 9:32 AM

## 2016-01-10 NOTE — Progress Notes (Addendum)
[facility] notifed that CT of Abd  Results new order to place foley and inform of 1400 out put will continue to monitor. Ilean Skill LPN

## 2016-01-10 NOTE — Progress Notes (Signed)
Patient ID: Cassandra [name], female   DOB: [birth year], 82 y.o.   MRN: [id number]      [address]       [place] [address]             [phone]                 2 Days Post-Op Procedure(s) (LRB): AMPUTATION TRANSMETATARSAL LEFT FOOT and achilles tendon lengthening (Left)  LOS: 9 days   Subjective: Feels much better since I first saw her sat feb 4, remains afebrile, less confused , sitting in chair   Objective: Vital signs in last 24 hours: Patient Vitals for the past 24 hrs:  BP Temp Temp src Pulse Resp SpO2  01/10/16 1657 (!) 146/69 mmHg 97.8 F (36.6 C) Oral (!) 101 18 96 %  01/10/16 1618 (!) 135/57 mmHg 98.3 F (36.8 C) Oral 97 18 97 %  01/10/16 0924 (!) 157/67 mmHg - - (!) 104 16 97 %  01/10/16 0528 134/67 mmHg 97.1 F (36.2 C) Oral 97 18 95 %  01/09/16 2119 (!) 155/76 mmHg 98.7 F (37.1 C) Oral (!) 102 18 97 %    Filed Weights   12/29/15 0759 12/29/15 1957  Weight: 178 lb (80.74 kg) 179 lb 0.2 oz (81.2 kg)    Hemodynamic parameters for last 24 hours:    Intake/Output from previous day: 02/06 0701 - 02/07 0700 In: 1355.8 [P.O.:480; I.V.:575.8; IV Piggyback:300] Out: 1400 [Urine:1400] Intake/Output this shift: Total I/O In: 1347.8 [P.O.:290; I.V.:727.8; Blood:30; IV Piggyback:300] Out: 1350 [Urine:1350]  Scheduled Meds: .  stroke: mapping our early stages of recovery book   Does not apply Once  . amLODipine  10 mg Oral Daily  . antiseptic oral rinse  7 mL Mouth Rinse BID  . aspirin  81 mg Oral Daily  . brinzolamide  1 drop Both Eyes TID   And  . brimonidine  1 drop Both Eyes TID  . calcium-vitamin D  1 tablet Oral Daily  . Chlorhexidine Gluconate Cloth  6 each Topical Daily  . famotidine  20 mg Oral Daily  . feeding supplement (ENSURE ENLIVE)  237 mL Oral BID BM  . furosemide  20 mg Intravenous Once  . isosorbide mononitrate  30 mg Oral Daily  . latanoprost  1 drop Both Eyes QHS  . metoCLOPramide (REGLAN) injection  10 mg  Intravenous 4 times per day  . mupirocin ointment  1 application Nasal BID  . nafcillin IV  2 g Intravenous Q4H  . nystatin  5 mL Oral QID  . pantoprazole  40 mg Oral BID  . sertraline  100 mg Oral Daily  . simvastatin  10 mg Oral QHS   Continuous Infusions: . sodium chloride 10 mL/hr at 01/10/16 0737   PRN Meds:.acetaminophen **OR** acetaminophen, albuterol, gi cocktail, haloperidol lactate, iohexol, iohexol, LORazepam, morphine injection, nitroGLYCERIN, ondansetron, polyvinyl alcohol  Exam- unchanged  from yesterday  Lab Results: CBC:  Recent Labs  01/09/16 0520 01/10/16 0553  WBC 25.1* 17.8*  HGB 9.5* 7.6*  HCT 28.7* 23.5*  PLT 375 355   BMET:   Recent Labs  01/09/16 0520 01/10/16 0553  NA 139 135  K 3.2* 3.7  CL 106 102  CO2 24 23  GLUCOSE 151* 106*  BUN 22* 14  CREATININE 0.98 0.85  CALCIUM 7.9* 7.6*    PT/INR: No results for input(s): LABPROT, INR in the last 72 hours. Wound culture     Status: None  Collection Time: 01/03/16  6:43 PM  Result Value Ref Range Status   Specimen Description WOUND LEFT FOOT  Final   Special Requests NONE  Final   Gram Stain   Final    NO WBC SEEN NO SQUAMOUS EPITHELIAL CELLS SEEN NO ORGANISMS SEEN Performed at [facility]    Culture   Final    MODERATE STAPHYLOCOCCUS AUREUS Note: RIFAMPIN AND GENTAMICIN SHOULD NOT BE USED AS SINGLE DRUGS FOR TREATMENT OF STAPH INFECTIONS. Performed at [facility]    Report Status 01/06/2016 FINAL  Final   Organism ID, Bacteria STAPHYLOCOCCUS AUREUS  Final      Susceptibility   Staphylococcus aureus - MIC*    CLINDAMYCIN <=0.25 SENSITIVE Sensitive     ERYTHROMYCIN <=0.25 SENSITIVE Sensitive     GENTAMICIN <=0.5 SENSITIVE Sensitive     LEVOFLOXACIN <=0.12 SENSITIVE Sensitive     OXACILLIN <=0.25 SENSITIVE Sensitive     RIFAMPIN <=0.5 SENSITIVE Sensitive     TRIMETH/SULFA <=10 SENSITIVE Sensitive     VANCOMYCIN 1 SENSITIVE Sensitive     TETRACYCLINE <=1  SENSITIVE Sensitive     MOXIFLOXACIN <=0.25 SENSITIVE Sensitive     * MODERATE STAPHYLOCOCCUS AUREUS  Culture, blood (routine x 2)     Status: None (Preliminary result)   Collection Time: 01/06/16  4:55 PM  Result Value Ref Range Status   Specimen Description BLOOD LEFT ANTECUBITAL  Final   Special Requests BOTTLES DRAWN AEROBIC AND ANAEROBIC 10CC  Final   Culture NO GROWTH < 24 HOURS  Final   Report Status PENDING  Incomplete  Culture, blood (routine x 2)     Status: None (Preliminary result)   Collection Time: 01/06/16  4:59 PM  Result Value Ref Range Status   Specimen Description BLOOD BLOOD LEFT HAND  Final   Special Requests BOTTLES DRAWN AEROBIC AND ANAEROBIC 10CC  Final   Culture NO GROWTH < 24 HOURS  Final   Report Status PENDING  Incomplete   Recent Results (from the past 240 hour(s))  Culture, blood (Routine X 2) w Reflex to ID Panel     Status: None   Collection Time: 01/06/16  4:59 PM  Result Value Ref Range Status   Specimen Description BLOOD BLOOD LEFT HAND  Final   Special Requests BOTTLES DRAWN AEROBIC AND ANAEROBIC 10CC  Final   Culture NO GROWTH 4 DAYS  Final   Report Status PENDING  Incomplete   Radiology MRI of foot 1/31//2017 CLINICAL DATA: Bacteremia. Open wounds on the left foot with history of amputation.  EXAM: MRI OF THE LEFT FOREFOOT WITHOUT CONTRAST  TECHNIQUE: Multiplanar, multisequence MR imaging was performed. No intravenous contrast was administered.  COMPARISON: 01/02/2016; 09/15/2014  FINDINGS: The patient refused IV contrast. Despite efforts by the technologist and patient, severe motion artifact is present on today's exam and could not be eliminated. This reduces exam sensitivity and specificity.  First digit amputation at the MTP joint, with a approximately 1.5 by 0.8 by 0.8 cm fluid collection just plantar to the head of the first metatarsal as shown on images 27-32 of series 10, and surrounding abnormal inflammatory  effacement of the adipose tissues. Abscess not excluded. Low signal intensity focus on image 33 series 9 possibly a tiny microscopic metallic particle or tiny focus of gas along the amputation margin.  As on conventional radiographs, we demonstrate erosive findings in the head of the second and third metatarsals as well as in the bases of the proximal phalanges of the second  and third toes. There is some low-level increased T2 signal along the third metatarsal head as on image 27 series 10  Low-level edema tracks along the plantar musculature of the foot. There is some mild subcutaneous edema dorsally in the foot.  Degenerative findings along the Lisfranc joint. No Lisfranc joint malalignment identified.  IMPRESSION: 1. Erosive findings at the second and third metatarsophalangeal joints are confirmed. This could represent infection, gout, or rheumatoid arthropathy. Particularly in the head of the third metatarsal, osteomyelitis is of greater suspicion. Today's images are severely degraded by motion artifact, and the patient refused IV contrast. 2. Fluid collection plantar to the head of the first metatarsal -abscess not excluded given the inflammatory effacement of the surrounding fatty subcutaneous tissues.   Electronically Signed  By: Gaylyn Rong M.D.  On: 01/03/2016 08:41  Assessment/Plan: S/P Procedure(s) (LRB): AMPUTATION TRANSMETATARSAL LEFT FOOT and achilles tendon lengthening (Left) See previous note yesterday Combination of physical findings , MRI of left foot and positive culture of foot makes left foot chronic infection most likely source of seeding mitral valve. Would favor control of primary source and then  continued antibiotic therpy  for mitral endocarditis and hopefully can clear without valve replacement/repair. Both blood culture from 2/3 no growth so far x  4 days Discussed with Cardiology, Dr  Duke Salvia, patient is very poor candidate to  recovery from major surgery, hopefully will remain afebrile and with negative cultures on antibiotic therapy. Not in heart failure. Currently  no immediate indication for cardiac surgery.   Generally accepted indications for surgical treatment of endocarditis:  Valve abnormalities or regurgitation resulting in congestive heart failure Microorganisms that are not controlled by antimicrobial therapy (fungal) Endocarditis leading to valve dehiscence, perforation, rupture or fistula or large perivalvular abscess, recurrent emboli Persistent vegetation or fever/bacteremia despite optimal treatment vegetations  that are mobile and larger then>10 mm in diameter on the mitral valve vegetations that are increasing in size despite antimicrobial therapy Mitral "kissing" vegetation  Delight Ovens MD 01/10/2016 6:40 PM

## 2016-01-11 ENCOUNTER — Other Ambulatory Visit: Payer: Self-pay

## 2016-01-11 DIAGNOSIS — D735 Infarction of spleen: Secondary | ICD-10-CM

## 2016-01-11 DIAGNOSIS — I76 Septic arterial embolism: Secondary | ICD-10-CM

## 2016-01-11 DIAGNOSIS — I669 Occlusion and stenosis of unspecified cerebral artery: Secondary | ICD-10-CM

## 2016-01-11 LAB — BASIC METABOLIC PANEL
ANION GAP: 8 (ref 5–15)
BUN: 10 mg/dL (ref 6–20)
CHLORIDE: 99 mmol/L — AB (ref 101–111)
CO2: 28 mmol/L (ref 22–32)
Calcium: 7.8 mg/dL — ABNORMAL LOW (ref 8.9–10.3)
Creatinine, Ser: 0.84 mg/dL (ref 0.44–1.00)
GFR calc Af Amer: >60 mL/min (ref >60)
GLUCOSE: 99 mg/dL (ref 65–99)
POTASSIUM: 3.1 mmol/L — AB (ref 3.5–5.1)
Sodium: 135 mmol/L (ref 135–145)

## 2016-01-11 LAB — CBC
HEMATOCRIT: 33 % — AB (ref 36.0–46.0)
HEMOGLOBIN: 11.3 g/dL — AB (ref 12.0–15.0)
MCH: 30.8 pg (ref 26.0–34.0)
MCHC: 34.2 g/dL (ref 30.0–36.0)
MCV: 89.9 fL (ref 78.0–100.0)
Platelets: 398 10*3/uL (ref 150–400)
RBC: 3.67 MIL/uL — ABNORMAL LOW (ref 3.87–5.11)
RDW: 14.6 % (ref 11.5–15.5)
WBC: 17.1 10*3/uL — ABNORMAL HIGH (ref 4.0–10.5)

## 2016-01-11 LAB — CULTURE, BLOOD (ROUTINE X 2)
Culture: NO GROWTH
Culture: NO GROWTH

## 2016-01-11 LAB — TYPE AND SCREEN
ABO/RH(D): O POS
Antibody Screen: NEGATIVE
Unit division: 0
Unit division: 0

## 2016-01-11 MED ORDER — POTASSIUM CHLORIDE CRYS ER 20 MEQ PO TBCR
40.0000 meq | EXTENDED_RELEASE_TABLET | ORAL | Status: AC
  Administered 2016-01-11 (×2): 40 meq via ORAL
  Filled 2016-01-11 (×2): qty 2

## 2016-01-11 MED ORDER — MAGNESIUM SULFATE 2 GM/50ML IV SOLN
2.0000 g | Freq: Once | INTRAVENOUS | Status: AC
  Administered 2016-01-11: 2 g via INTRAVENOUS
  Filled 2016-01-11: qty 50

## 2016-01-11 NOTE — Progress Notes (Signed)
Inpatient Rehabilitation  Met with patient at bedside to discuss rehab plans.  Patient reported that she was still deciding.  However, leaning toward going to [facility] where she received therapy in the past or to [facility] where her granddaughter works.  Discussed IP Rehab as an option and encouraged patient to continue discussion with family.  Will follow up tomorrow.   Carmelia Roller., CCC/SLP Admission Coordinator  [place]  Cell [phone]

## 2016-01-11 NOTE — Progress Notes (Signed)
[facility] PROGRESS NOTE  ANNTONETTE [name] YNW:[id number] DOB: 23-Mar-1933 DOA: 12/29/2015 PCP: Colette Ribas, MD  Interim Summary:  82 year old female who presented to the hospital with complaints of nausea, vomiting and abdominal pain. She was found to have a possible viral gastroenteritis and was treated supportively. During her hospital stay, she developed significant fevers. Blood cultures were sent and returned positive for MSSA bacteremia. Initially this was suspected to be related to the chronic wound on her foot. MRI of the foot could not rule out underlying osteoarthritis versus abscess. Podiatry was consulted. While at [facility] she had worsening mental status changes for which an MRI of brain was obtained, this study performed on 01/04/2016 revealed multiple scattered acute lacunar infarcts in the brain. Having a positive blood cultures endocarditis strongly suspected for which she was transferred to [facility]. A transesophageal echocardiogram performed on 01/05/2016 revealed a large hypermobile mitral valve vegetation. Source of infection felt to be from left foot osteomyelitis. Orthopedic surgery was consulted as she was taken to the operating room on 01/08/2016 undergoing transmetatarsal amputation of left foot. Cardiothoracic surgery and cardiology consulted during this hospitalization. On 01/09/2016 patient had complained of worsening right flank pain for which a CT scan of abdomen and pelvis was ordered. This study revealed bilateral hydronephrosis without evidence of distal obstruction. Foley catheter was placed with drainage of 1.4 L of urine. CT also revealed splenic infarct likely coming from septic emboli. She remains on nafcillin 2 g IV every 4 hours. With regard to culture data blood cultures obtained on 01/03/2016 and 01/05/2016 both growing methicillin sensitive Staphylococcus aureus. Cultures from 01/06/2016 remain sterile.   Assessment/Plan: 1.  Sepsis/Endocarditis. Present on admission. Evidenced by blood cultures from 12/31/2015 growing  MSSA, white count of 25,200, having a temp of 101.7 on admission. Case was discussed with Dr. Ilsa Iha of infectious disease. She is currently on Nafcillin 2 g IV every 4 hours. With MRI of brain showing multiple scattered infarcts/concern for septic emboli. She underwent a transesophageal echocardiogram on 01/05/2016 which revealed large hypermobile mitral valve vegetation. Source of bacteremia felt to be left foot osteomyelitis. CT surgery consulted for recommendations on surgical intervention. CT surgery and ID, recommended consulting orthopedic surgery to address left foot infection prior to any cardiothoracic intervention. On 01/08/2016 she was taken to the OR where she underwent transmetatarsal amputation of left foot osteo.  Repeat blood cultures from 01/06/2016 showing no growth to date. On 01/10/2016 case discussed with Dr.Snider of infectious disease recommending 6 weeks of IV antibiotic therapy. CVTS will reassess after antibiotics therapy completed to decide at that time needs for surgery/valvular repair.    2. Septic emboli- MRI performed 01/04/2016 showed Multiple (approximately 6) scattered punctate acute lacunar infarcts in the brain. Left MCA, bilateral PCA, and SCA territories are affected. This was further workup with transesophageal echocardiogram that revealed mitral valve vegetation. She is on IV antibiotic therapy with nafcillin. CT surgery consulted to determine if she is a candidate for surgical intervention. From a neurologic standpoint she seems stable. Plan is to continue antibiotics for now and reassess after treatment need for valvular surgery.  3. Chronic left foot wound/osteomyelitis . MRI of foot from 01/03/2016 reported by radiology to show possible underlying osteomyelitis, cannot rule out underlying abscess. This was suspected to be source of bacteremia. Podiatry consulted at [facility], she was evaluated by Dr.McKinney.  Wound culture grew MSSA. On 01/07/2016 Case discussed with Dr Shon Baton of Orthopedic Surgery for evaluation of  foot and MRI findings. On 01/08/2016 she underwent  transmetatarsal amputation of left foot for chronic osteomyelitis. Per ID rec's will continue antibiotics for a total of 6 weeks (last day March 20). Will follow orthopedic service rec's  4. Splenic infarct. Patient complaining of abdominal pain on 01/09/2016, further worked up with CT scan of abdomen and pelvis with IV contrast, study revealed splenic infarct involving 30%, likely secondary to septic emboli. Continue IV antibiotics.    5. Urinary retention, CT scan from 01/10/2016 showing bilateral hydronephrosis without evidence of distal obstruction. A Foley catheter was placed with immediate drainage of 1.4 L of urine. Plan to keep for a catheter in place with outpatient follow-up with urology. I suspect this may be the cause of her flank pain.  6. Acute anemia/presumed to be secondary to blood loss with surgery. Lab work on 01/10/2016 showing downward trend in hemoglobin to 7.6 from 9.5 on 01/09/2016. Status post 2 units of PRBC's. Will monitor Hgb trend and transfuse as needed.  7. HTN, blood pressures improved, plan to continue Norvasc 10 mg by mouth daily and Imdur 30 mg by mouth daily. Will monitor and adjust as needed   8.HLD, continue statin   Code Status: Full DVT prophylaxis: Heparin Family Communication: I spoke with her daughter and granddaughter who were present at bedside Disposition Plan: PT recommending CIR; will follow acceptance/further rec's.    Consultants:  PT - SNF  Infectious Disease  Podiatry  Neurology  Cardiology  Orthopedic surgery  Procedures:  ECHO Study Conclusions- Left ventricle: The cavity size was normal. Systolic function was vigorous. The estimated ejection fraction was in the range of 65% to 70%. Wall motion was normal; there were no  regional wall motion abnormalities. Doppler parameters are consistent with abnormal left ventricular relaxation (grade 1 diastolic dysfunction). There was no evidence of elevated ventricular filling pressure by Doppler parameters. - Aortic valve: Trileaflet; normal thickness leaflets. There was no regurgitation. - Aortic root: The aortic root was normal in size. - Mitral valve: Structurally normal valve. There was mild regurgitation. - Left atrium: The atrium was mildly dilated. - Right ventricle: Systolic function was normal. - Tricuspid valve: There was mild regurgitation. - Pulmonic valve: There was no regurgitation. - Pulmonary arteries: Systolic pressure was moderately increased. PA peak pressure: 48 mm Hg (S). - Inferior vena cava: The vessel was normal in size. - Pericardium, extracardiac: There was no pericardial effusion.  Antibiotics:  Vancomycin 1/29>>1/31  Cefazolin 1/29>> 2/1  Nafcillin 2/1>>  HPI/Subjective: Good urine output. Denies CP, SOB and is afebrile. Patient is AAOX3.  Objective: Filed Vitals:   01/11/16 0944 01/11/16 1433  BP: 154/65 132/70  Pulse: 104 98  Temp:  98.1 F (36.7 C)  Resp: 19 20    Intake/Output Summary (Last 24 hours) at 01/11/16 1604 Last data filed at 01/11/16 1300  Gross per 24 hour  Intake   1518 ml  Output   3950 ml  Net  -2432 ml   Filed Weights   12/29/15 0759 12/29/15 1957  Weight: 80.74 kg (178 lb) 81.2 kg (179 lb 0.2 oz)    Exam: General: NAD, afebrile. In no acute distress. Sitting at bedside chair, eating lunch Cardiovascular: Regular rate and rhythm. I-II/VI systolic murmur heard best at aortic and pulmonic area  Respiratory: CTAB. No wheezes, rales, or rhonchi.   Abdomen: soft, non tender, no distention, bowel sounds normal Musculoskeletal: She has 1+ bilateral lower extremity pitting edema, status post transmetatarsal amputation to left foot. Foot with  clean dressing in place   Data  Reviewed: Basic Metabolic Panel:  Recent Labs Lab 01/07/16 0909 01/08/16 0540 01/09/16 0520 01/10/16 0553 01/11/16 0517  NA 142 144 139 135 135  K 2.9* 3.7 3.2* 3.7 3.1*  CL 106 111 106 102 99*  CO2 22 25 24 23 28   GLUCOSE 115* 125* 151* 106* 99  BUN 31* 26* 22* 14 10  CREATININE 0.96 0.87 0.98 0.85 0.84  CALCIUM 8.2* 7.8* 7.9* 7.6* 7.8*   CBC:  Recent Labs Lab 01/07/16 0909 01/08/16 0540 01/09/16 0520 01/10/16 0553 01/11/16 0517  WBC 20.8* 19.7* 25.1* 17.8* 17.1*  HGB 10.8* 10.1* 9.5* 7.6* 11.3*  HCT 32.5* 29.9* 28.7* 23.5* 33.0*  MCV 90.8 91.4 92.0 91.1 89.9  PLT 299 330 375 355 398   Cardiac Enzymes:  Recent Labs Lab 01/06/16 0734 01/06/16 1351 01/06/16 2012  TROPONINI 0.08* 0.13* 0.10*     Recent Results (from the past 240 hour(s))  Culture, blood (Routine X 2) w Reflex to ID Panel     Status: None   Collection Time: 01/02/16  2:11 PM  Result Value Ref Range Status   Specimen Description BLOOD RIGHT ANTECUBITAL  Final   Special Requests   Final    BOTTLES DRAWN AEROBIC AND ANAEROBIC 6CC EACH  IMMUNE:COMPROMISED   Culture NO GROWTH 6 DAYS  Final   Report Status 01/08/2016 FINAL  Final  Culture, blood (Routine X 2) w Reflex to ID Panel     Status: None   Collection Time: 01/02/16  2:13 PM  Result Value Ref Range Status   Specimen Description BLOOD RIGHT HAND  Final   Special Requests   Final    BOTTLES DRAWN AEROBIC AND ANAEROBIC 6CC  IMMUNE:COMPROMISED   Culture NO GROWTH 6 DAYS  Final   Report Status 01/08/2016 FINAL  Final  Wound culture     Status: None   Collection Time: 01/03/16  6:43 PM  Result Value Ref Range Status   Specimen Description WOUND LEFT FOOT  Final   Special Requests NONE  Final   Gram Stain   Final    NO WBC SEEN NO SQUAMOUS EPITHELIAL CELLS SEEN NO ORGANISMS SEEN Performed at [facility]    Culture   Final    MODERATE STAPHYLOCOCCUS AUREUS Note: RIFAMPIN AND GENTAMICIN SHOULD NOT BE USED AS SINGLE DRUGS FOR  TREATMENT OF STAPH INFECTIONS. Performed at [facility]    Report Status 01/06/2016 FINAL  Final   Organism ID, Bacteria STAPHYLOCOCCUS AUREUS  Final      Susceptibility   Staphylococcus aureus - MIC*    CLINDAMYCIN <=0.25 SENSITIVE Sensitive     ERYTHROMYCIN <=0.25 SENSITIVE Sensitive     GENTAMICIN <=0.5 SENSITIVE Sensitive     LEVOFLOXACIN <=0.12 SENSITIVE Sensitive     OXACILLIN <=0.25 SENSITIVE Sensitive     RIFAMPIN <=0.5 SENSITIVE Sensitive     TRIMETH/SULFA <=10 SENSITIVE Sensitive     VANCOMYCIN 1 SENSITIVE Sensitive     TETRACYCLINE <=1 SENSITIVE Sensitive     MOXIFLOXACIN <=0.25 SENSITIVE Sensitive     * MODERATE STAPHYLOCOCCUS AUREUS  Culture, blood (routine x 2)     Status: None   Collection Time: 01/05/16  8:06 PM  Result Value Ref Range Status   Specimen Description BLOOD RIGHT ARM  Final   Special Requests IN PEDIATRIC BOTTLE 4CC  Final   Culture  Setup Time   Final    GRAM POSITIVE COCCI IN CLUSTERS AEROBIC BOTTLE ONLY CRITICAL RESULT CALLED  TO, READ BACK BY AND VERIFIED WITH: J EDWARDS RN 1556 01/06/16 A BROWNING    Culture STAPHYLOCOCCUS AUREUS  Final   Report Status 01/08/2016 FINAL  Final   Organism ID, Bacteria STAPHYLOCOCCUS AUREUS  Final      Susceptibility   Staphylococcus aureus - MIC*    CIPROFLOXACIN <=0.5 SENSITIVE Sensitive     ERYTHROMYCIN <=0.25 SENSITIVE Sensitive     GENTAMICIN <=0.5 SENSITIVE Sensitive     OXACILLIN 0.5 SENSITIVE Sensitive     TETRACYCLINE <=1 SENSITIVE Sensitive     VANCOMYCIN <=0.5 SENSITIVE Sensitive     TRIMETH/SULFA <=10 SENSITIVE Sensitive     CLINDAMYCIN <=0.25 SENSITIVE Sensitive     RIFAMPIN <=0.5 SENSITIVE Sensitive     Inducible Clindamycin NEGATIVE Sensitive     * STAPHYLOCOCCUS AUREUS  Culture, blood (routine x 2)     Status: None   Collection Time: 01/05/16  8:08 PM  Result Value Ref Range Status   Specimen Description BLOOD RIGHT HAND  Final   Special Requests BOTTLES DRAWN AEROBIC ONLY 9CC   Final   Culture NO GROWTH 5 DAYS  Final   Report Status 01/10/2016 FINAL  Final  Culture, blood (routine x 2)     Status: None   Collection Time: 01/06/16  4:55 PM  Result Value Ref Range Status   Specimen Description BLOOD LEFT ANTECUBITAL  Final   Special Requests BOTTLES DRAWN AEROBIC AND ANAEROBIC 10CC  Final   Culture NO GROWTH 5 DAYS  Final   Report Status 01/11/2016 FINAL  Final  Culture, blood (routine x 2)     Status: None   Collection Time: 01/06/16  4:59 PM  Result Value Ref Range Status   Specimen Description BLOOD BLOOD LEFT HAND  Final   Special Requests BOTTLES DRAWN AEROBIC AND ANAEROBIC 10CC  Final   Culture NO GROWTH 5 DAYS  Final   Report Status 01/11/2016 FINAL  Final  Surgical PCR screen     Status: Abnormal   Collection Time: 01/08/16  2:44 PM  Result Value Ref Range Status   MRSA, PCR NEGATIVE NEGATIVE Final   Staphylococcus aureus POSITIVE (A) NEGATIVE Final    Comment:        The Xpert SA Assay (FDA approved for NASAL specimens in patients over 45 years of age), is one component of a comprehensive surveillance program.  Test performance has been validated by [facility] for patients greater than or equal to 66 year old. It is not intended to diagnose infection nor to guide or monitor treatment.   C difficile quick scan w PCR reflex     Status: None   Collection Time: 01/08/16  3:45 PM  Result Value Ref Range Status   C Diff antigen NEGATIVE NEGATIVE Final   C Diff toxin NEGATIVE NEGATIVE Final   C Diff interpretation Negative for toxigenic C. difficile  Final     Studies: Mr Lodema Pilot Contrast  2016-01-25  CLINICAL DATA:  82 year old female with altered mental status, not responding. Initial encounter. EXAM: MRI HEAD WITHOUT AND WITH CONTRAST TECHNIQUE: Multiplanar, multiecho pulse sequences of the brain and surrounding structures were obtained without and with intravenous contrast. CONTRAST:  16mL MULTIHANCE GADOBENATE DIMEGLUMINE 529 MG/ML IV  SOLN COMPARISON:  Head CT without contrast 06/14/2014. Brain MRI, MRA a 03/13/2012. FINDINGS: About half a dozen punctate foci of restricted diffusion are demonstrated scattered in the bilateral hemispheres and superior cerebellum. One of these areas is in the medial right occipital lobe PCA territory  where there is suggestion of mild underlying cortical encephalomalacia (series 102, image 19). Left MCA, bilateral PCA, and superior cerebellar artery territories are affected. No acute intracranial hemorrhage identified. No midline shift, mass effect, or evidence of intracranial mass lesion. Major intracranial vascular flow voids are stable. Minimal to mild generalized cerebral volume loss since 2013. No ventriculomegaly, extra-axial collection or acute intracranial hemorrhage. Cervicomedullary junction and pituitary are within normal limits. Negative visualized cervical spine. Patchy and scattered bilateral cerebral white matter T2 and FLAIR hyperintensity is stable. No abnormal enhancement identified. Visible internal auditory structures appear normal. Mild chronic right mastoid effusion is unchanged. Nasopharynx remains normal. Trace paranasal sinus mucosal thickening. Chronic postoperative changes to both globes. Negative scalp soft tissues. IMPRESSION: 1. 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 Left MCA, bilateral PCA, and SCA territories are affected. Therefore, consider recent embolic event from the heart or proximal aorta (less likely posterior circulation only thromboembolism). 2. No associated hemorrhage or mass effect. 3. Otherwise no significant change in the MRI appearance of the brain since 2013. Electronically Signed   By: Odessa Fleming M.D.   On: 01/04/2016 09:49   Mr Foot Left Wo Contrast  01/03/2016  CLINICAL DATA:  Bacteremia. Open wounds on the left foot with history of amputation. EXAM: MRI OF THE LEFT FOREFOOT WITHOUT CONTRAST TECHNIQUE: Multiplanar,  multisequence MR imaging was performed. No intravenous contrast was administered. COMPARISON:  01/02/2016; 09/15/2014 FINDINGS: The patient refused IV contrast. Despite efforts by the technologist and patient, severe motion artifact is present on today's exam and could not be eliminated. This reduces exam sensitivity and specificity. First digit amputation at the MTP joint, with a approximately 1.5 by 0.8 by 0.8 cm fluid collection just plantar to the head of the first metatarsal as shown on images 27-32 of series 10, and surrounding abnormal inflammatory effacement of the adipose tissues. Abscess not excluded. Low signal intensity focus on image 33 series 9 possibly a tiny microscopic metallic particle or tiny focus of gas along the amputation margin. As on conventional radiographs, we demonstrate erosive findings in the head of the second and third metatarsals as well as in the bases of the proximal phalanges of the second and third toes. There is some low-level increased T2 signal along the third metatarsal head as on image 27 series 10 Low-level edema tracks along the plantar musculature of the foot. There is some mild subcutaneous edema dorsally in the foot. Degenerative findings along the Lisfranc joint. No Lisfranc joint malalignment identified. IMPRESSION: 1. Erosive findings at the second and third metatarsophalangeal joints are confirmed. This could represent infection, gout, or rheumatoid arthropathy. Particularly in the head of the third metatarsal, osteomyelitis is of greater suspicion. Today's images are severely degraded by motion artifact, and the patient refused IV contrast. 2. Fluid collection plantar to the head of the first metatarsal -abscess not excluded given the inflammatory effacement of the surrounding fatty subcutaneous tissues. Electronically Signed   By: Gaylyn Rong M.D.   On: 01/03/2016 08:41    Scheduled Meds: .  stroke: mapping our early stages of recovery book   Does not  apply Once  . amLODipine  10 mg Oral Daily  . antiseptic oral rinse  7 mL Mouth Rinse BID  . aspirin  81 mg Oral Daily  . brinzolamide  1 drop Both Eyes TID   And  . brimonidine  1 drop Both Eyes TID  . calcium-vitamin D  1 tablet Oral Daily  . Chlorhexidine  Gluconate Cloth  6 each Topical Daily  . famotidine  20 mg Oral Daily  . feeding supplement (ENSURE ENLIVE)  237 mL Oral BID BM  . isosorbide mononitrate  30 mg Oral Daily  . latanoprost  1 drop Both Eyes QHS  . metoCLOPramide (REGLAN) injection  10 mg Intravenous 4 times per day  . mupirocin ointment  1 application Nasal BID  . nafcillin IV  2 g Intravenous Q4H  . nystatin  5 mL Oral QID  . pantoprazole  40 mg Oral BID  . sertraline  100 mg Oral Daily  . simvastatin  10 mg Oral QHS   Continuous Infusions: . sodium chloride 10 mL/hr at 01/10/16 1610    Principal Problem:   Sepsis (HCC) Active Problems:   Hypertension   Hyperlipidemia   Gastroenteritis   Glaucoma   Staphylococcus aureus bacteremia   Diminished pulses in lower extremity   Emesis   Cerebral embolism with cerebral infarction   Bacterial endocarditis   Chronic osteomyelitis of left foot (HCC)   Chest tightness   Chest pain   Shortness of breath   Loose stools   Time spent: 35 minutes   Vassie Loll MD [phone]  [facility] 01/11/2016, 4:04 PM  LOS: 10 days

## 2016-01-11 NOTE — Progress Notes (Signed)
Subjective:  Afebrile, working with physical therapy sitting in a chair  Interval hx: seen by Dr .Kennith Maes who recommended medical management for endocarditis, too high risk for surgery   Antibiotics:  Anti-infectives    Start     Dose/Rate Route Frequency Ordered Stop   01/04/16 0200  nafcillin 2 g in dextrose 5 % 100 mL IVPB     2 g 200 mL/hr over 30 Minutes Intravenous Every 4 hours 01/04/16 0151     01/04/16 0045  nafcillin injection 2 g  Status:  Discontinued     2 g Intravenous Every 4 hours 01/04/16 0011 01/04/16 0151   01/01/16 2300  vancomycin (VANCOCIN) IVPB 750 mg/150 ml premix  Status:  Discontinued     750 mg 150 mL/hr over 60 Minutes Intravenous Every 12 hours 01/01/16 1042 01/03/16 1209   01/01/16 1400  ceFAZolin (ANCEF) IVPB 2 g/50 mL premix  Status:  Discontinued     2 g 100 mL/hr over 30 Minutes Intravenous 3 times per day 01/01/16 1041 01/04/16 0011   01/01/16 1200  vancomycin (VANCOCIN) 1,500 mg in sodium chloride 0.9 % 500 mL IVPB     1,500 mg 250 mL/hr over 120 Minutes Intravenous  Once 01/01/16 1041 01/01/16 1512      Medications: Scheduled Meds: .  stroke: mapping our early stages of recovery book   Does not apply Once  . amLODipine  10 mg Oral Daily  . antiseptic oral rinse  7 mL Mouth Rinse BID  . aspirin  81 mg Oral Daily  . brinzolamide  1 drop Both Eyes TID   And  . brimonidine  1 drop Both Eyes TID  . calcium-vitamin D  1 tablet Oral Daily  . Chlorhexidine Gluconate Cloth  6 each Topical Daily  . famotidine  20 mg Oral Daily  . feeding supplement (ENSURE ENLIVE)  237 mL Oral BID BM  . isosorbide mononitrate  30 mg Oral Daily  . latanoprost  1 drop Both Eyes QHS  . metoCLOPramide (REGLAN) injection  10 mg Intravenous 4 times per day  . mupirocin ointment  1 application Nasal BID  . nafcillin IV  2 g Intravenous Q4H  . nystatin  5 mL Oral QID  . pantoprazole  40 mg Oral BID  . sertraline  100 mg Oral Daily  . simvastatin  10  mg Oral QHS   Objective: Weight change:   Intake/Output Summary (Last 24 hours) at 01/11/16 1437 Last data filed at 01/11/16 1100  Gross per 24 hour  Intake   1199 ml  Output   3950 ml  Net  -2751 ml   Blood pressure 132/70, pulse 98, temperature 98.1 F (36.7 C), temperature source Oral, resp. rate 20, height 5' 9.5" (1.765 m), weight 179 lb 0.2 oz (81.2 kg), SpO2 98 %. Temp:  [97.8 F (36.6 C)-99.1 F (37.3 C)] 98.1 F (36.7 C) (02/08 1433) Pulse Rate:  [96-104] 98 (02/08 1433) Resp:  [18-20] 20 (02/08 1433) BP: (130-157)/(57-76) 132/70 mmHg (02/08 1433) SpO2:  [91 %-98 %] 98 % (02/08 1433)  Physical Exam: General: Alert and awake, oriented, answers questions appropriately. Sitting  Up in chair HEENT:  EOMI PULM: CTAB no W/C/R Abd: scattered echymosis from lovenox injection Ext: Left foot/leg wrapped from surgery.  Neuro: nonfocal  CBC:  CBC Latest Ref Rng 01/11/2016 01/10/2016 01/09/2016  WBC 4.0 - 10.5 K/uL 17.1(H) 17.8(H) 25.1(H)  Hemoglobin 12.0 - 15.0 g/dL 11.3(L) 7.6(L) 9.5(L)  Hematocrit  36.0 - 46.0 % 33.0(L) 23.5(L) 28.7(L)  Platelets 150 - 400 K/uL 398 355 375      BMET  Recent Labs  01/10/16 0553 01/11/16 0517  NA 135 135  K 3.7 3.1*  CL 102 99*  CO2 23 28  GLUCOSE 106* 99  BUN 14 10  CREATININE 0.85 0.84  CALCIUM 7.6* 7.8*   Lab Results  Component Value Date   ESRSEDRATE 75* 01/03/2016   Lab Results  Component Value Date   CRP 24.0* 01/03/2016    Micro Results: Recent Results (from the past 720 hour(s))  C difficile quick scan w PCR reflex     Status: None   Collection Time: 12/30/15 12:28 AM  Result Value Ref Range Status   C Diff antigen NEGATIVE NEGATIVE Final   C Diff toxin NEGATIVE NEGATIVE Final   C Diff interpretation Negative for toxigenic C. difficile  Final    Comment: NEGATIVE  Culture, blood (routine x 2)     Status: None   Collection Time: 12/31/15 12:16 PM  Result Value Ref Range Status   Specimen Description BLOOD  RIGHT ARM  Final   Special Requests BOTTLES DRAWN AEROBIC AND ANAEROBIC 6CC  Final   Culture  Setup Time   Final    GRAM POSITIVE COCCI IN CLUSTERS IN BOTH AEROBIC AND ANAEROBIC BOTTLES CRITICAL RESULT CALLED TO, READ BACK BY AND VERIFIED WITH: Carnella Guadalajara, J. AT 0035 ON 01/01/2016 BY AGUNDIZ,E.    Culture   Final    STAPHYLOCOCCUS AUREUS Performed at [facility]    Report Status 01/03/2016 FINAL  Final   Organism ID, Bacteria STAPHYLOCOCCUS AUREUS  Final      Susceptibility   Staphylococcus aureus - MIC*    CIPROFLOXACIN <=0.5 SENSITIVE Sensitive     ERYTHROMYCIN <=0.25 SENSITIVE Sensitive     GENTAMICIN <=0.5 SENSITIVE Sensitive     OXACILLIN <=0.25 SENSITIVE Sensitive     TETRACYCLINE <=1 SENSITIVE Sensitive     VANCOMYCIN <=0.5 SENSITIVE Sensitive     TRIMETH/SULFA <=10 SENSITIVE Sensitive     CLINDAMYCIN <=0.25 SENSITIVE Sensitive     RIFAMPIN <=0.5 SENSITIVE Sensitive     Inducible Clindamycin NEGATIVE Sensitive     * STAPHYLOCOCCUS AUREUS  Culture, blood (routine x 2)     Status: None   Collection Time: 12/31/15 12:33 PM  Result Value Ref Range Status   Specimen Description BLOOD RIGHT ARM  Final   Special Requests BOTTLES DRAWN AEROBIC AND ANAEROBIC 6CC  Final   Culture  Setup Time   Final    GRAM POSITIVE COCCI IN CLUSTERS IN BOTH AEROBIC AND ANAEROBIC BOTTLES CRITICAL RESULT CALLED TO, READ BACK BY AND VERIFIED WITH: LAVINDER,J. AT 1610 ON 01/01/2016 BY AGUNDIZ,E.    Culture   Final    STAPHYLOCOCCUS AUREUS SUSCEPTIBILITIES PERFORMED ON PREVIOUS CULTURE WITHIN THE LAST 5 DAYS. Performed at [facility]    Report Status 01/03/2016 FINAL  Final  Culture, blood (Routine X 2) w Reflex to ID Panel     Status: None   Collection Time: 01/02/16  2:11 PM  Result Value Ref Range Status   Specimen Description BLOOD RIGHT ANTECUBITAL  Final   Special Requests   Final    BOTTLES DRAWN AEROBIC AND ANAEROBIC 6CC EACH  IMMUNE:COMPROMISED   Culture NO GROWTH 6  DAYS  Final   Report Status 01/08/2016 FINAL  Final  Culture, blood (Routine X 2) w Reflex to ID Panel     Status: None   Collection  Time: 01/02/16  2:13 PM  Result Value Ref Range Status   Specimen Description BLOOD RIGHT HAND  Final   Special Requests   Final    BOTTLES DRAWN AEROBIC AND ANAEROBIC 6CC  IMMUNE:COMPROMISED   Culture NO GROWTH 6 DAYS  Final   Report Status 01/08/2016 FINAL  Final  Wound culture     Status: None   Collection Time: 01/03/16  6:43 PM  Result Value Ref Range Status   Specimen Description WOUND LEFT FOOT  Final   Special Requests NONE  Final   Gram Stain   Final    NO WBC SEEN NO SQUAMOUS EPITHELIAL CELLS SEEN NO ORGANISMS SEEN Performed at [facility]    Culture   Final    MODERATE STAPHYLOCOCCUS AUREUS Note: RIFAMPIN AND GENTAMICIN SHOULD NOT BE USED AS SINGLE DRUGS FOR TREATMENT OF STAPH INFECTIONS. Performed at [facility]    Report Status 01/06/2016 FINAL  Final   Organism ID, Bacteria STAPHYLOCOCCUS AUREUS  Final      Susceptibility   Staphylococcus aureus - MIC*    CLINDAMYCIN <=0.25 SENSITIVE Sensitive     ERYTHROMYCIN <=0.25 SENSITIVE Sensitive     GENTAMICIN <=0.5 SENSITIVE Sensitive     LEVOFLOXACIN <=0.12 SENSITIVE Sensitive     OXACILLIN <=0.25 SENSITIVE Sensitive     RIFAMPIN <=0.5 SENSITIVE Sensitive     TRIMETH/SULFA <=10 SENSITIVE Sensitive     VANCOMYCIN 1 SENSITIVE Sensitive     TETRACYCLINE <=1 SENSITIVE Sensitive     MOXIFLOXACIN <=0.25 SENSITIVE Sensitive     * MODERATE STAPHYLOCOCCUS AUREUS  Culture, blood (routine x 2)     Status: None   Collection Time: 01/05/16  8:06 PM  Result Value Ref Range Status   Specimen Description BLOOD RIGHT ARM  Final   Special Requests IN PEDIATRIC BOTTLE 4CC  Final   Culture  Setup Time   Final    GRAM POSITIVE COCCI IN CLUSTERS AEROBIC BOTTLE ONLY CRITICAL RESULT CALLED TO, READ BACK BY AND VERIFIED WITH: J EDWARDS RN 1556 01/06/16 A BROWNING    Culture  STAPHYLOCOCCUS AUREUS  Final   Report Status 01/08/2016 FINAL  Final   Organism ID, Bacteria STAPHYLOCOCCUS AUREUS  Final      Susceptibility   Staphylococcus aureus - MIC*    CIPROFLOXACIN <=0.5 SENSITIVE Sensitive     ERYTHROMYCIN <=0.25 SENSITIVE Sensitive     GENTAMICIN <=0.5 SENSITIVE Sensitive     OXACILLIN 0.5 SENSITIVE Sensitive     TETRACYCLINE <=1 SENSITIVE Sensitive     VANCOMYCIN <=0.5 SENSITIVE Sensitive     TRIMETH/SULFA <=10 SENSITIVE Sensitive     CLINDAMYCIN <=0.25 SENSITIVE Sensitive     RIFAMPIN <=0.5 SENSITIVE Sensitive     Inducible Clindamycin NEGATIVE Sensitive     * STAPHYLOCOCCUS AUREUS  Culture, blood (routine x 2)     Status: None   Collection Time: 01/05/16  8:08 PM  Result Value Ref Range Status   Specimen Description BLOOD RIGHT HAND  Final   Special Requests BOTTLES DRAWN AEROBIC ONLY 9CC  Final   Culture NO GROWTH 5 DAYS  Final   Report Status 01/10/2016 FINAL  Final  Culture, blood (routine x 2)     Status: None   Collection Time: 01/06/16  4:55 PM  Result Value Ref Range Status   Specimen Description BLOOD LEFT ANTECUBITAL  Final   Special Requests BOTTLES DRAWN AEROBIC AND ANAEROBIC 10CC  Final   Culture NO GROWTH 5 DAYS  Final   Report Status  01/11/2016 FINAL  Final  Culture, blood (routine x 2)     Status: None   Collection Time: 01/06/16  4:59 PM  Result Value Ref Range Status   Specimen Description BLOOD BLOOD LEFT HAND  Final   Special Requests BOTTLES DRAWN AEROBIC AND ANAEROBIC 10CC  Final   Culture NO GROWTH 5 DAYS  Final   Report Status 01/11/2016 FINAL  Final  Surgical PCR screen     Status: Abnormal   Collection Time: 01/08/16  2:44 PM  Result Value Ref Range Status   MRSA, PCR NEGATIVE NEGATIVE Final   Staphylococcus aureus POSITIVE (A) NEGATIVE Final    Comment:        The Xpert SA Assay (FDA approved for NASAL specimens in patients over 43 years of age), is one component of a comprehensive surveillance program.  Test  performance has been validated by [facility] for patients greater than or equal to 65 year old. It is not intended to diagnose infection nor to guide or monitor treatment.   C difficile quick scan w PCR reflex     Status: None   Collection Time: 01/08/16  3:45 PM  Result Value Ref Range Status   C Diff antigen NEGATIVE NEGATIVE Final   C Diff toxin NEGATIVE NEGATIVE Final   C Diff interpretation Negative for toxigenic C. difficile  Final   Assessment/Plan:   Principal Problem:   Sepsis (HCC) Active Problems:   Hypertension   Hyperlipidemia   Gastroenteritis   Glaucoma   Staphylococcus aureus bacteremia   Diminished pulses in lower extremity   Emesis   Cerebral embolism with cerebral infarction   Bacterial endocarditis   Chronic osteomyelitis of left foot (HCC)   Chest tightness   Chest pain   Shortness of breath   Loose stools    Cassandra [name] is a 82 y.o. 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 source of her infection likely being chronic osteomyelitis of left foot s/p TM amputation on 2/5  for source control. Her vegetation is very long noted on TEE but does not have any associated valvular insufficiency  #1 Methicillin sensitive staph coccus aureus native valve endocarditis with large lengthwise vegetation with CNS emboli, splenic infarct and osteomyelitis of her left foot s/p TM amputation, had prolonged bacteremia up until amputation of affected foot  --continue Nafcillin for CNS penetration -- repeat blood cx on 2/3 remains NGTD --Greatly appreciate cardiology cardiothoracic surgery orthopedic surgery, Neurology  and the primary team following the patient very closely. - patient is still at risk for further embolization from vegetation while on treatment - would also recommend repeat echo once she has completed her treatment  - plan to treat for 6 wks using 2/6 as day 1 of 42. If she does not tolerate nafcillin for the  whole course, we can change to other treatment option - recommend to touch base with CT surgery for follow up and confirmation to whether she is a surgical candidate vs. Medical management alone - she will need twice a week BMP and weekly CBC while on nafcillin  #2 leukocytosis = appears improving  #3 right hip pain = found to be possibly related to large hydronephrosis R>L found on CT last night. Continue to monitor for improvement now that she has foley catheter which initial drained 1.4L UOP.   #4 encephalopathy = improved. Appears back to her baseline.   dispo - anticipate that she will need rehab to help with  becoming full functioning again after significant hospitalization  Will arrange for follow up in the clinic. Will sign off  -------------------- Diagnosis: mv endocarditis with CNS emboli and splenic infarct  Culture Result: MSSA  Allergies  Allergen Reactions  . Codeine Shortness Of Breath and Nausea And Vomiting  . Hydrocodone Shortness Of Breath and Nausea And Vomiting    Discharge antibiotics: Per pharmacy protocol nafcillin  Duration: 6 wk End Date: March 20th, 2017  [facility] Care Per Protocol:  Labs weekly while on IV antibiotics: __x CBC with differential- weekly _x_ BMP twice a week   Fax weekly labs to [phone]  Clinic Follow Up Appt: 4-6 wk  @ Dr. Drue Second or Zenaida Niece dam at [facility]     LOS: 10 days   Judyann Munson 01/11/2016, 2:37 PM

## 2016-01-11 NOTE — Progress Notes (Addendum)
Patient had 20 beats SVT. Patient complained of chest pressure when her "heart was racing" but now has no pain and no pressure. VSS. K level 3.1. Dr. Gwenlyn Perking paged. Orders placed  Dr. Gwenlyn Perking paged to make aware EKG is available for his review in epic.

## 2016-01-11 NOTE — Progress Notes (Signed)
Physical Therapy Treatment Patient Details Name: Cassandra [name] MRN: [id number] DOB: Jul 26, 1933 Today's Date: 01/11/2016    History of Present Illness 82yo white female who presents to [facility] on 12/29/15 after 2 days of N/V, R flank pain, and generalized weakness. Pt admitted for Gastroenteritis after workup. At baseline, pt reports she is a fully indep community ambulator without AD. PMH: HTN, HLD, and L hallux amputation 1ya. During past few days patient has been increasingly lethargic and somnolent. MRI findings triggered podiatry consult on 1/31, followed by debridement and new NWB status on LLE. MRI also performed secondary to acute encephlaopathy/AMS and revealed multiple acattered punctuate acute lunar infarcts. Pt was transferred from [facility] on 01/04/16.  Pt then underwent a L transmetatarsal amputation on the L and L percutaneous achilles tendon lengthening.     PT Comments    Pt performed transfer from bed to chair with encouragement to participate.  Pt c/o pain in buttocks.  Barrier cream applied to patient post perianal care before performing transfer.  Pt required multiple bouts of rolling but remains to require +2 assist for bed mobility and transfers.    Follow Up Recommendations  CIR     Equipment Recommendations  None recommended by PT    Recommendations for Other Services       Precautions / Restrictions Precautions Precautions: Fall Precaution Comments: L foot infection now s/p L transmetatarsal amputation with achilles lengthening. / NWB Restrictions Weight Bearing Restrictions: Yes LLE Weight Bearing: Non weight bearing (unable to maintain in standing remains to require squat pivot for safety.  )    Mobility  Bed Mobility Overal bed mobility: Needs Assistance;+2 for physical assistance Bed Mobility: Rolling;Supine to Sit Rolling: Min assist (required multiple bouts of rolling to clean pt from stool incontinence.  )   Supine to sit: +2 for physical  assistance;+2 for safety/equipment;Max assist (able to initiate advancement of LEs to edge of bed but required +2 max assist for trunk control to sit edge of bed.  )     General bed mobility comments: Patient performed supine to sit with +2 assist requiring use of draw pad to advance patient to edge of bed.  Pt demonstrated improved sitting balance edge of bed.      Transfers Overall transfer level: Needs assistance Equipment used: None Transfers: Squat Pivot Transfers Sit to Stand: Total assist;+2 physical assistance (attempted sit to stand with RW Pt unable to maintain L NWB therefore after lifting hip briefly from bed required return to seated position and plan changed to squat pivot from bed to chair.  )   Squat pivot transfers: +2 physical assistance;Max assist     General transfer comment: Pt remains unable to maintain L NWB during standing requiring continued use of squat pivot for safe transfer from bed to chair.    Ambulation/Gait Ambulation/Gait assistance:  (Pt remains unable to perform gt secondary to weakness and innability to maintain NWB on LLE.  )               Stairs            Wheelchair Mobility    Modified Rankin (Stroke Patients Only)       Balance     Sitting balance-Leahy Scale: Fair Sitting balance - Comments: pt able to sit on EOB without physical assist but no challenges given.     Standing balance-Leahy Scale: Zero  Cognition Arousal/Alertness: Awake/alert Behavior During Therapy: Anxious Overall Cognitive Status: Within Functional Limits for tasks assessed Area of Impairment: Safety/judgement;Memory;Attention;Problem solving     Memory: Decreased short-term memory Following Commands: Follows multi-step commands with increased time Safety/Judgement: Decreased awareness of safety   Problem Solving: Slow processing;Decreased initiation;Difficulty sequencing;Requires verbal cues;Requires tactile cues       Exercises      General Comments        Pertinent Vitals/Pain Pain Score: 6  Pain Location: buttocks, red, informed nursing, barrier cream applied post perianal care from bowel incont.  Pain Descriptors / Indicators: Grimacing;Tender;Burning Pain Intervention(s): Monitored during session (pillow placed underbottom for comfort.  )    Home Living                      Prior Function            PT Goals (current goals can now be found in the care plan section) Acute Rehab PT Goals Potential to Achieve Goals: Good Progress towards PT goals: Progressing toward goals    Frequency  Min 3X/week    PT Plan Current plan remains appropriate    Co-evaluation             End of Session Equipment Utilized During Treatment: Gait belt Activity Tolerance: Patient limited by fatigue Patient left: with call bell/phone within reach;in chair;with family/visitor present     Time: 4098-1191 PT Time Calculation (min) (ACUTE ONLY): 28 min  Charges:  $Therapeutic Activity: 23-37 mins                    G Codes:      Florestine Avers 02/10/16, 1:51 PM Cassandra [name], PTA pager [phone]

## 2016-01-11 NOTE — Progress Notes (Addendum)
Subjective: 3 Days Post-Op Procedure(s) (LRB): AMPUTATION TRANSMETATARSAL LEFT FOOT and achilles tendon lengthening (Left)  Patient reports pain as mild to moderate.  Denies fever, chills, N/V.  Tolerating POs well.  Admits to BM.  Patient is in good spirits and states that she has bene very pleased with her care.  Objective:   VITALS:  Temp:  [97.8 F (36.6 C)-99.1 F (37.3 C)] 98.4 F (36.9 C) (02/08 0607) Pulse Rate:  [96-104] 100 (02/08 0607) Resp:  [16-20] 20 (02/08 0607) BP: (130-157)/(57-76) 147/69 mmHg (02/08 0607) SpO2:  [91 %-97 %] 95 % (02/08 0607)  General: WDWN patient in NAD. Psych:  Appropriate mood and affect. Neuro:  A&O x 3, Moving all extremities, sensation intact to light touch HEENT:  EOMs intact Chest:  Even non-labored respirations Skin:  Dressing C/D/I, no rashes or lesions Extremities: warm/dry, no visible edema, erythmea, or echymosis.  No lymphadenopathy. Pulses: Popliteus 2+ MSK:  ROM: EHL/FHL intact, MMT: patient is able to perform quad set, (-) Homan's    LABS  Recent Labs  01/09/16 0520 01/10/16 0553 01/11/16 0517  HGB 9.5* 7.6* 11.3*  WBC 25.1* 17.8* 17.1*  PLT 375 355 398    Recent Labs  01/10/16 0553 01/11/16 0517  NA 135 135  K 3.7 3.1*  CL 102 99*  CO2 23 28  BUN 14 10  CREATININE 0.85 0.84  GLUCOSE 106* 99   No results for input(s): LABPT, INR in the last 72 hours.   Assessment/Plan: 3 Days Post-Op Procedure(s) (LRB): AMPUTATION TRANSMETATARSAL LEFT FOOT and achilles tendon lengthening (Left)  Up with therapy  NWB LLE Plan for 2 week outpatient post-op visit with Dr. Victorino Dike.  Alfredo Martinez, PA-C, AT[facility] Office:  [phone]  We'll sign off.  Please call with any questions.

## 2016-01-11 NOTE — Progress Notes (Signed)
Occupational Therapy Treatment Patient Details Name: Cassandra [name] MRN: [id number] DOB: 08-29-1933 Today's Date: 01/11/2016    History of present illness 82yo white female who presents to [facility] on 12/29/15 after 2 days of N/V, R flank pain, and generalized weakness. Pt admitted for Gastroenteritis after workup. At baseline, pt reports she is a fully indep community ambulator without AD. PMH: HTN, HLD, and L hallux amputation 1ya. During past few days patient has been increasingly lethargic and somnolent. MRI findings triggered podiatry consult on 1/31, followed by debridement and new NWB status on LLE. MRI also performed secondary to acute encephlaopathy/AMS and revealed multiple acattered punctuate acute lunar infarcts. Pt was transferred from [facility] on 01/04/16.  Pt then underwent a L transmetatarsal amputation on the L and L percutaneous achilles tendon lengthening.    OT comments  Pt with very limited participation with OT despite max encouragement.   Unable to achieve EOB this date due to pt actively resisting.  Feel she would benefit from CIR if she will consistently participate.  Will follow.   Follow Up Recommendations  CIR;Supervision/Assistance - 24 hour    Equipment Recommendations  None recommended by OT    Recommendations for Other Services Rehab consult    Precautions / Restrictions Precautions Precautions: Fall Precaution Comments: L foot infection now s/p L transmetatarsal amputation with achilles lengthening. / NWB Restrictions Weight Bearing Restrictions: Yes LLE Weight Bearing: Non weight bearing       Mobility Bed Mobility Overal bed mobility: Needs Assistance;+2 for physical assistance Bed Mobility: Rolling;Supine to Sit Rolling: Min assist (required multiple bouts of rolling to clean pt from stool incontinence.  )   Supine to sit: +2 for physical assistance;+2 for safety/equipment;Max assist (able to initiate advancement of LEs to edge of bed but  required +2 max assist for trunk control to sit edge of bed.  )     General bed mobility comments: Moved LEs off bed with mod A.  Attempted to move pt to EOB.  Pt stated she didn't know if she could, then actively resisted all attempts to move her citing fatigue, then pain, then fear of dizziness, then need to eat.   Attempted to raise HOB to assist pt into sitting, but pt then insisted on returning to supine.  Long discussion with pt re: need to participate, risks of being in the bed and immobility.  Does not appear to understand.  Unsure if this is pt's baseline functioning, or due to new cognitive deficits from CVA   Transfers Overall transfer level: Needs assistance Equipment used: None Transfers: Squat Pivot Transfers Sit to Stand: Total assist;+2 physical assistance (attempted sit to stand with RW Pt unable to maintain L NWB therefore after lifting hip briefly from bed required return to seated position and plan changed to squat pivot from bed to chair.  )   Squat pivot transfers: +2 physical assistance;Max assist     General transfer comment: Unable to attempt     Balance     Sitting balance-Leahy Scale: Fair Sitting balance - Comments: pt able to sit on EOB without physical assist but no challenges given.     Standing balance-Leahy Scale: Zero                     ADL  General ADL Comments: Pt resisted all attempts at movement.  family feeding pt.       Vision                     Perception     Praxis      Cognition   Behavior During Therapy: Anxious Overall Cognitive Status: Impaired/Different from baseline Area of Impairment: Attention;Memory;Safety/judgement;Problem solving   Current Attention Level: Sustained Memory: Decreased recall of precautions;Decreased short-term memory  Following Commands: Follows one step commands consistently Safety/Judgement: Decreased awareness of  safety;Decreased awareness of deficits   Problem Solving: Slow processing;Decreased initiation;Difficulty sequencing;Requires verbal cues;Requires tactile cues      Extremity/Trunk Assessment               Exercises     Shoulder Instructions       General Comments      Pertinent Vitals/ Pain       Pain Assessment: Faces Pain Score: 6  Faces Pain Scale: Hurts little more Pain Location: back  Pain Descriptors / Indicators: Grimacing;Guarding Pain Intervention(s): Patient requesting pain meds-RN notified  Home Living                                          Prior Functioning/Environment              Frequency Min 2X/week     Progress Toward Goals  OT Goals(current goals can now be found in the care plan section)  Progress towards OT goals: Not progressing toward goals - comment (due to limited participation this date )  ADL Goals Pt Will Perform Grooming: with modified independence;with set-up;sitting Pt Will Perform Lower Body Bathing: with supervision;with adaptive equipment;sitting/lateral leans Pt Will Transfer to Toilet: with min assist;ambulating;bedside commode Pt Will Perform Toileting - Clothing Manipulation and hygiene: with supervision;sit to/from stand;sitting/lateral leans Pt Will Perform Tub/Shower Transfer: Tub transfer;with min assist;ambulating;rolling walker;tub bench Pt/caregiver will Perform Home Exercise Program: Increased strength;Both right and left upper extremity;With Supervision;With written HEP provided Additional ADL Goal #1: Pt will sit w/o LOB during light functional activity w/o LOB in preparation for increased participation with self care tasks/endurance.  Plan Discharge plan remains appropriate    Co-evaluation                 End of Session     Activity Tolerance Patient limited by fatigue;Patient limited by pain   Patient Left in bed;with call bell/phone within reach;with family/visitor present    Nurse Communication Mobility status        Time: 2956-2130 OT Time Calculation (min): 26 min  Charges: OT General Charges $OT Visit: 1 Procedure OT Treatments $Therapeutic Activity: 23-37 mins  Tamika Nou M 01/11/2016, 3:02 PM

## 2016-01-11 NOTE — Progress Notes (Signed)
Nutrition Follow-up  DOCUMENTATION CODES:   Not applicable  INTERVENTION:   -Continue Ensure Enlive po BID, each supplement provides 350 kcal and 20 grams of protein  NUTRITION DIAGNOSIS:   Increased nutrient needs related to acute illness as evidenced by estimated needs.  Ongoing  GOAL:   Patient will meet greater than or equal to 90% of their needs  Progressing  MONITOR:   PO intake, Supplement acceptance, Labs, I & O's, Weight trends, Skin  REASON FOR ASSESSMENT:   Malnutrition Screening Tool    ASSESSMENT:   82 y.o. female with h56istory of hypertension, arthritis, hyperlipidemia, left leg chronic ulcer who was admitted to [facility] and found to have MSSA bacteremia. Transferred to [facility] for further workup. MRI showed scattered punctate infarcts concerning for septic embolli.   S/p Procedure(s) 01/08/16: 1. Left percutaneous achilles tendon lengthening (separate incision) 2. Left transmetatarsal amputation  Pt in room with therapy and nursing at time of visit.   Pt's intake has improved; noted 25-75% meal completion per doc flowsheet records. Pt is taking Ensure Enlive supplements; RD will continue order to optimize nutritional intake.   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   Pt and family still deciding next venue of care; either CIR or SNF placement. CIR admissions team following.   Labs reviewed: K: 3.1.   Diet Order:  Diet regular Room service appropriate?: Yes; Fluid consistency:: Thin  Skin:  Wound (see comment) (closed lt leg incision, open lt foot incision)  Last BM:  01/11/16  Height:   Ht Readings from Last 1 Encounters:  01/04/16 5' 9.5" (1.765 m)    Weight:   Wt Readings from Last 1 Encounters:  12/29/15 179 lb 0.2 oz (81.2 kg)    Ideal Body Weight:  67.04 kg  BMI:  Body mass index is 26.07 kg/(m^2).  Estimated Nutritional  Needs:   Kcal:  1850-2050  Protein:  85-95 grams  Fluid:  1.8 - 2 L/day  EDUCATION NEEDS:   No education needs identified at this time  Aztlan Coll A. Mayford Knife, RD, LDN, CDE Pager: [phone] After hours Pager: [phone]

## 2016-01-12 DIAGNOSIS — Z89432 Acquired absence of left foot: Secondary | ICD-10-CM

## 2016-01-12 LAB — BASIC METABOLIC PANEL
Anion gap: 8 (ref 5–15)
BUN: 10 mg/dL (ref 6–20)
CALCIUM: 7.6 mg/dL — AB (ref 8.9–10.3)
CO2: 25 mmol/L (ref 22–32)
CREATININE: 0.77 mg/dL (ref 0.44–1.00)
Chloride: 100 mmol/L — ABNORMAL LOW (ref 101–111)
GFR calc non Af Amer: >60 mL/min (ref >60)
GLUCOSE: 88 mg/dL (ref 65–99)
Potassium: 3.8 mmol/L (ref 3.5–5.1)
Sodium: 133 mmol/L — ABNORMAL LOW (ref 135–145)

## 2016-01-12 LAB — GLUCOSE, CAPILLARY
Glucose-Capillary: 128 mg/dL — ABNORMAL HIGH (ref 65–99)
Glucose-Capillary: 125 mg/dL — ABNORMAL HIGH (ref 65–99)

## 2016-01-12 LAB — MAGNESIUM: Magnesium: 2.1 mg/dL (ref 1.7–2.4)

## 2016-01-12 NOTE — Progress Notes (Signed)
[facility] PROGRESS NOTE  Cassandra ZAVALETA WUJ:[id number] DOB: 1933/10/04 DOA: 12/29/2015 PCP: Colette Ribas, MD  Interim Summary:  82 year old female who presented to the hospital with complaints of nausea, vomiting and abdominal pain. She was found to have a possible viral gastroenteritis and was treated supportively. During her hospital stay, she developed significant fevers. Blood cultures were sent and returned positive for MSSA bacteremia. Initially this was suspected to be related to the chronic wound on her foot. MRI of the foot could not rule out underlying osteoarthritis versus abscess. Podiatry was consulted. While at [facility] she had worsening mental status changes for which an MRI of brain was obtained, this study performed on 01/04/2016 revealed multiple scattered acute lacunar infarcts in the brain. Having a positive blood cultures endocarditis strongly suspected for which she was transferred to [facility]. A transesophageal echocardiogram performed on 01/05/2016 revealed a large hypermobile mitral valve vegetation. Source of infection felt to be from left foot osteomyelitis. Orthopedic surgery was consulted as she was taken to the operating room on 01/08/2016 undergoing transmetatarsal amputation of left foot. Cardiothoracic surgery and cardiology consulted during this hospitalization. On 01/09/2016 patient had complained of worsening right flank pain for which a CT scan of abdomen and pelvis was ordered. This study revealed bilateral hydronephrosis without evidence of distal obstruction. Foley catheter was placed with drainage of 1.4 L of urine. CT also revealed splenic infarct likely coming from septic emboli. She remains on nafcillin 2 g IV every 4 hours. With regard to culture data blood cultures obtained on 01/03/2016 and 01/05/2016 both growing methicillin sensitive Staphylococcus aureus. Cultures from 01/06/2016 remain sterile.   Assessment/Plan: 1.  Sepsis/Endocarditis. Present on admission. Evidenced by blood cultures from 12/31/2015 growing  MSSA, white count of 25,200, having a temp of 101.7 on admission. Case was discussed with Dr. Ilsa Iha of infectious disease. She is currently on Nafcillin 2 g IV every 4 hours. With MRI of brain showing multiple scattered infarcts/concern for septic emboli. She underwent a transesophageal echocardiogram on 01/05/2016 which revealed large hypermobile mitral valve vegetation. Source of bacteremia felt to be left foot osteomyelitis. CT surgery consulted for recommendations on surgical intervention. CT surgery and ID, recommended consulting orthopedic surgery to address left foot infection prior to any cardiothoracic intervention. On 01/08/2016 she was taken to the OR where she underwent transmetatarsal amputation of left foot osteo.  Repeat blood cultures from 01/06/2016 showing no growth to date. On 01/10/2016 case discussed with Dr.Snider of infectious disease recommending 6 weeks of IV antibiotic therapy. CVTS will reassess after antibiotics therapy completed to decide at that time needs for surgery/valvular repair.    2. Septic emboli- MRI performed 01/04/2016 showed Multiple (approximately 6) scattered punctate acute lacunar infarcts in the brain. Left MCA, bilateral PCA, and SCA territories are affected. This was further workup with transesophageal echocardiogram that revealed mitral valve vegetation. She is on IV antibiotic therapy with nafcillin. CT surgery consulted to determine if she is a candidate for surgical intervention. From a neurologic standpoint she seems stable. Plan is to continue antibiotics for now and reassess after abx treatment need for valvular surgery.  3. Chronic left foot wound/osteomyelitis . MRI of foot from 01/03/2016 reported by radiology to show possible underlying osteomyelitis, cannot rule out underlying abscess. This was suspected to be source of bacteremia. Podiatry consulted at [facility], she was evaluated by Dr.McKinney.  Wound culture grew MSSA. On 01/07/2016 Case discussed with Dr Shon Baton of Orthopedic Surgery for evaluation  of foot and MRI findings. On 01/08/2016 she underwent  transmetatarsal amputation of left foot for chronic osteomyelitis. Per ID rec's will continue antibiotics for a total of 6 weeks (last day March 20). Will follow orthopedic service rec's. Still waiting on PICC placement  4. Splenic infarct. Patient complaining of abdominal pain on 01/09/2016, further worked up with CT scan of abdomen and pelvis with IV contrast, study revealed splenic infarct involving 30%, likely secondary to septic emboli. Continue IV antibiotics.    5. Urinary retention, CT scan from 01/10/2016 showing bilateral hydronephrosis without evidence of distal obstruction. A Foley catheter was placed with immediate drainage of 1.4 L of urine. Plan to keep for a catheter in place with outpatient follow-up with urology. I suspect this may be the cause of her flank pain.  6. Acute anemia/presumed to be secondary to blood loss with surgery. Lab work on 01/10/2016 showing downward trend in hemoglobin to 7.6 from 9.5 on 01/09/2016. Status post 2 units of PRBC's. Will monitor Hgb trend and transfuse as needed.  7. HTN, blood pressures improved, plan to continue Norvasc 10 mg by mouth daily and Imdur 30 mg by mouth daily. Will monitor and adjust as needed   8.HLD, continue statin   Code Status: Full DVT prophylaxis: Heparin Family Communication: I spoke with her daughter and granddaughter who were present at bedside Disposition Plan: PT recommending CIR; will follow acceptance/further rec's.    Consultants:  PT - SNF  Infectious Disease  Podiatry  Neurology  Cardiology  Orthopedic surgery  Procedures:  ECHO Study Conclusions- Left ventricle: The cavity size was normal. Systolic function was vigorous. The estimated ejection fraction was in the range of 65% to 70%.  Wall motion was normal; there were no regional wall motion abnormalities. Doppler parameters are consistent with abnormal left ventricular relaxation (grade 1 diastolic dysfunction). There was no evidence of elevated ventricular filling pressure by Doppler parameters. - Aortic valve: Trileaflet; normal thickness leaflets. There was no regurgitation. - Aortic root: The aortic root was normal in size. - Mitral valve: Structurally normal valve. There was mild regurgitation. - Left atrium: The atrium was mildly dilated. - Right ventricle: Systolic function was normal. - Tricuspid valve: There was mild regurgitation. - Pulmonic valve: There was no regurgitation. - Pulmonary arteries: Systolic pressure was moderately increased. PA peak pressure: 48 mm Hg (S). - Inferior vena cava: The vessel was normal in size. - Pericardium, extracardiac: There was no pericardial effusion.  Antibiotics:  Vancomycin 1/29>>1/31  Cefazolin 1/29>> 2/1  Nafcillin 2/1>>  HPI/Subjective: Good urine output. Denies CP, SOB and is afebrile. Patient is AAOX3. No acute distress and denying nausea/vomiting or abd pain.   Objective: Filed Vitals:   01/12/16 0455 01/12/16 1354  BP: 145/67 129/57  Pulse: 99 91  Temp: 99.4 F (37.4 C) 97.9 F (36.6 C)  Resp:  20    Intake/Output Summary (Last 24 hours) at 01/12/16 1825 Last data filed at 01/12/16 1354  Gross per 24 hour  Intake    120 ml  Output   1900 ml  Net  -1780 ml   Filed Weights   12/29/15 0759 12/29/15 1957  Weight: 80.74 kg (178 lb) 81.2 kg (179 lb 0.2 oz)    Exam: General: NAD, afebrile. In no acute distress. Sitting on the bed eating lunch. More perky, conversant and in no distress Cardiovascular: Regular rate and rhythm. I-II/VI systolic murmur heard best at aortic and pulmonic area  Respiratory: CTAB. No wheezes, rales, or rhonchi.  Abdomen: soft, non tender, no distention, bowel sounds normal Musculoskeletal: She has  1+ bilateral lower extremity pitting edema, status post transmetatarsal amputation to left foot. Foot with clean dressing in place   Data Reviewed: Basic Metabolic Panel:  Recent Labs Lab 01/08/16 0540 01/09/16 0520 01/10/16 0553 01/11/16 0517 01/12/16 0711  NA 144 139 135 135 133*  K 3.7 3.2* 3.7 3.1* 3.8  CL 111 106 102 99* 100*  CO2 25 24 23 28 25   GLUCOSE 125* 151* 106* 99 88  BUN 26* 22* 14 10 10   CREATININE 0.87 0.98 0.85 0.84 0.77  CALCIUM 7.8* 7.9* 7.6* 7.8* 7.6*  MG  --   --   --   --  2.1   CBC:  Recent Labs Lab 01/07/16 0909 01/08/16 0540 01/09/16 0520 01/10/16 0553 01/11/16 0517  WBC 20.8* 19.7* 25.1* 17.8* 17.1*  HGB 10.8* 10.1* 9.5* 7.6* 11.3*  HCT 32.5* 29.9* 28.7* 23.5* 33.0*  MCV 90.8 91.4 92.0 91.1 89.9  PLT 299 330 375 355 398   Cardiac Enzymes:  Recent Labs Lab 01/06/16 0734 01/06/16 1351 01/06/16 2012  TROPONINI 0.08* 0.13* 0.10*     Recent Results (from the past 240 hour(s))  Wound culture     Status: None   Collection Time: 01/03/16  6:43 PM  Result Value Ref Range Status   Specimen Description WOUND LEFT FOOT  Final   Special Requests NONE  Final   Gram Stain   Final    NO WBC SEEN NO SQUAMOUS EPITHELIAL CELLS SEEN NO ORGANISMS SEEN Performed at [facility]    Culture   Final    MODERATE STAPHYLOCOCCUS AUREUS Note: RIFAMPIN AND GENTAMICIN SHOULD NOT BE USED AS SINGLE DRUGS FOR TREATMENT OF STAPH INFECTIONS. Performed at [facility]    Report Status 01/06/2016 FINAL  Final   Organism ID, Bacteria STAPHYLOCOCCUS AUREUS  Final      Susceptibility   Staphylococcus aureus - MIC*    CLINDAMYCIN <=0.25 SENSITIVE Sensitive     ERYTHROMYCIN <=0.25 SENSITIVE Sensitive     GENTAMICIN <=0.5 SENSITIVE Sensitive     LEVOFLOXACIN <=0.12 SENSITIVE Sensitive     OXACILLIN <=0.25 SENSITIVE Sensitive     RIFAMPIN <=0.5 SENSITIVE Sensitive     TRIMETH/SULFA <=10 SENSITIVE Sensitive     VANCOMYCIN 1 SENSITIVE Sensitive      TETRACYCLINE <=1 SENSITIVE Sensitive     MOXIFLOXACIN <=0.25 SENSITIVE Sensitive     * MODERATE STAPHYLOCOCCUS AUREUS  Culture, blood (routine x 2)     Status: None   Collection Time: 01/05/16  8:06 PM  Result Value Ref Range Status   Specimen Description BLOOD RIGHT ARM  Final   Special Requests IN PEDIATRIC BOTTLE 4CC  Final   Culture  Setup Time   Final    GRAM POSITIVE COCCI IN CLUSTERS AEROBIC BOTTLE ONLY CRITICAL RESULT CALLED TO, READ BACK BY AND VERIFIED WITH: Claria Dice RN [id number] 01/06/16 A BROWNING    Culture STAPHYLOCOCCUS AUREUS  Final   Report Status 01/08/2016 FINAL  Final   Organism ID, Bacteria STAPHYLOCOCCUS AUREUS  Final      Susceptibility   Staphylococcus aureus - MIC*    CIPROFLOXACIN <=0.5 SENSITIVE Sensitive     ERYTHROMYCIN <=0.25 SENSITIVE Sensitive     GENTAMICIN <=0.5 SENSITIVE Sensitive     OXACILLIN 0.5 SENSITIVE Sensitive     TETRACYCLINE <=1 SENSITIVE Sensitive     VANCOMYCIN <=0.5 SENSITIVE Sensitive     TRIMETH/SULFA <=10 SENSITIVE Sensitive  CLINDAMYCIN <=0.25 SENSITIVE Sensitive     RIFAMPIN <=0.5 SENSITIVE Sensitive     Inducible Clindamycin NEGATIVE Sensitive     * STAPHYLOCOCCUS AUREUS  Culture, blood (routine x 2)     Status: None   Collection Time: 01/05/16  8:08 PM  Result Value Ref Range Status   Specimen Description BLOOD RIGHT HAND  Final   Special Requests BOTTLES DRAWN AEROBIC ONLY 9CC  Final   Culture NO GROWTH 5 DAYS  Final   Report Status 01/10/2016 FINAL  Final  Culture, blood (routine x 2)     Status: None   Collection Time: 01/06/16  4:55 PM  Result Value Ref Range Status   Specimen Description BLOOD LEFT ANTECUBITAL  Final   Special Requests BOTTLES DRAWN AEROBIC AND ANAEROBIC 10CC  Final   Culture NO GROWTH 5 DAYS  Final   Report Status 01/11/2016 FINAL  Final  Culture, blood (routine x 2)     Status: None   Collection Time: 01/06/16  4:59 PM  Result Value Ref Range Status   Specimen Description BLOOD BLOOD LEFT  HAND  Final   Special Requests BOTTLES DRAWN AEROBIC AND ANAEROBIC 10CC  Final   Culture NO GROWTH 5 DAYS  Final   Report Status 01/11/2016 FINAL  Final  Surgical PCR screen     Status: Abnormal   Collection Time: 01/08/16  2:44 PM  Result Value Ref Range Status   MRSA, PCR NEGATIVE NEGATIVE Final   Staphylococcus aureus POSITIVE (A) NEGATIVE Final    Comment:        The Xpert SA Assay (FDA approved for NASAL specimens in patients over 59 years of age), is one component of a comprehensive surveillance program.  Test performance has been validated by [facility] for patients greater than or equal to 61 year old. It is not intended to diagnose infection nor to guide or monitor treatment.   C difficile quick scan w PCR reflex     Status: None   Collection Time: 01/08/16  3:45 PM  Result Value Ref Range Status   C Diff antigen NEGATIVE NEGATIVE Final   C Diff toxin NEGATIVE NEGATIVE Final   C Diff interpretation Negative for toxigenic C. difficile  Final     Studies: Mr Lodema Pilot Contrast  01/15/16  CLINICAL DATA:  82 year old female with altered mental status, not responding. Initial encounter. EXAM: MRI HEAD WITHOUT AND WITH CONTRAST TECHNIQUE: Multiplanar, multiecho pulse sequences of the brain and surrounding structures were obtained without and with intravenous contrast. CONTRAST:  16mL MULTIHANCE GADOBENATE DIMEGLUMINE 529 MG/ML IV SOLN COMPARISON:  Head CT without contrast 06/14/2014. Brain MRI, MRA a 03/13/2012. FINDINGS: About half a dozen punctate foci of restricted diffusion are demonstrated scattered in the bilateral hemispheres and superior cerebellum. One of these areas is in the medial right occipital lobe PCA territory where there is suggestion of mild underlying cortical encephalomalacia (series 102, image 19). Left MCA, bilateral PCA, and superior cerebellar artery territories are affected. No acute intracranial hemorrhage identified. No midline shift, mass effect, or  evidence of intracranial mass lesion. Major intracranial vascular flow voids are stable. Minimal to mild generalized cerebral volume loss since 2013. No ventriculomegaly, extra-axial collection or acute intracranial hemorrhage. Cervicomedullary junction and pituitary are within normal limits. Negative visualized cervical spine. Patchy and scattered bilateral cerebral white matter T2 and FLAIR hyperintensity is stable. No abnormal enhancement identified. Visible internal auditory structures appear normal. Mild chronic right mastoid effusion is unchanged. Nasopharynx remains normal. Trace  paranasal sinus mucosal thickening. Chronic postoperative changes to both globes. Negative scalp soft tissues. IMPRESSION: 1. 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 Left MCA, bilateral PCA, and SCA territories are affected. Therefore, consider recent embolic event from the heart or proximal aorta (less likely posterior circulation only thromboembolism). 2. No associated hemorrhage or mass effect. 3. Otherwise no significant change in the MRI appearance of the brain since 2013. Electronically Signed   By: Odessa Fleming M.D.   On: 01/04/2016 09:49   Mr Foot Left Wo Contrast  01/03/2016  CLINICAL DATA:  Bacteremia. Open wounds on the left foot with history of amputation. EXAM: MRI OF THE LEFT FOREFOOT WITHOUT CONTRAST TECHNIQUE: Multiplanar, multisequence MR imaging was performed. No intravenous contrast was administered. COMPARISON:  01/02/2016; 09/15/2014 FINDINGS: The patient refused IV contrast. Despite efforts by the technologist and patient, severe motion artifact is present on today's exam and could not be eliminated. This reduces exam sensitivity and specificity. First digit amputation at the MTP joint, with a approximately 1.5 by 0.8 by 0.8 cm fluid collection just plantar to the head of the first metatarsal as shown on images 27-32 of series 10, and surrounding abnormal inflammatory  effacement of the adipose tissues. Abscess not excluded. Low signal intensity focus on image 33 series 9 possibly a tiny microscopic metallic particle or tiny focus of gas along the amputation margin. As on conventional radiographs, we demonstrate erosive findings in the head of the second and third metatarsals as well as in the bases of the proximal phalanges of the second and third toes. There is some low-level increased T2 signal along the third metatarsal head as on image 27 series 10 Low-level edema tracks along the plantar musculature of the foot. There is some mild subcutaneous edema dorsally in the foot. Degenerative findings along the Lisfranc joint. No Lisfranc joint malalignment identified. IMPRESSION: 1. Erosive findings at the second and third metatarsophalangeal joints are confirmed. This could represent infection, gout, or rheumatoid arthropathy. Particularly in the head of the third metatarsal, osteomyelitis is of greater suspicion. Today's images are severely degraded by motion artifact, and the patient refused IV contrast. 2. Fluid collection plantar to the head of the first metatarsal -abscess not excluded given the inflammatory effacement of the surrounding fatty subcutaneous tissues. Electronically Signed   By: Gaylyn Rong M.D.   On: 01/03/2016 08:41   Scheduled Meds: .  stroke: mapping our early stages of recovery book   Does not apply Once  . amLODipine  10 mg Oral Daily  . antiseptic oral rinse  7 mL Mouth Rinse BID  . aspirin  81 mg Oral Daily  . brinzolamide  1 drop Both Eyes TID   And  . brimonidine  1 drop Both Eyes TID  . calcium-vitamin D  1 tablet Oral Daily  . famotidine  20 mg Oral Daily  . feeding supplement (ENSURE ENLIVE)  237 mL Oral BID BM  . isosorbide mononitrate  30 mg Oral Daily  . latanoprost  1 drop Both Eyes QHS  . metoCLOPramide (REGLAN) injection  10 mg Intravenous 4 times per day  . mupirocin ointment  1 application Nasal BID  . nafcillin IV  2  g Intravenous Q4H  . pantoprazole  40 mg Oral BID  . sertraline  100 mg Oral Daily  . simvastatin  10 mg Oral QHS   Continuous Infusions: . sodium chloride 10 mL/hr at 01/10/16 0737    Principal Problem:   Sepsis (HCC)  Active Problems:   Hypertension   Hyperlipidemia   Gastroenteritis   Glaucoma   Staphylococcus aureus bacteremia   Diminished pulses in lower extremity   Emesis   Cerebral embolism with cerebral infarction   Bacterial endocarditis   Chronic osteomyelitis of left foot (HCC)   Chest tightness   Chest pain   Shortness of breath   Loose stools   Time spent: 35 minutes   Vassie Loll MD [phone]  [facility] 01/12/2016, 6:25 PM  LOS: 11 days

## 2016-01-12 NOTE — Discharge Instructions (Signed)
Keep your splint clean and dry.  Don't walk on the splint.

## 2016-01-12 NOTE — Progress Notes (Signed)
Patient ID: Cassandra [name], female   DOB: 1933/05/24, 82 y.o.   MRN: [id number] Patient ID: Cassandra [name], female   DOB: Jun 13, 1933, 82 y.o.   MRN: [id number]      [address]       [place] [address]             [phone]                 4 Days Post-Op Procedure(s) (LRB): AMPUTATION TRANSMETATARSAL LEFT FOOT and achilles tendon lengthening (Left)  LOS: 11 days   Subjective: Continues to feel better , much improved from 5 days ago when I first saw her.   less confused , sitting in chair   Objective: Vital signs in last 24 hours: Patient Vitals for the past 24 hrs:  BP Temp Temp src Pulse Resp SpO2  01/12/16 1354 (!) 129/57 mmHg 97.9 F (36.6 C) Oral 91 20 98 %  01/12/16 0455 (!) 145/67 mmHg 99.4 F (37.4 C) Oral 99 - 95 %  01/11/16 2113 (!) 143/77 mmHg 99.4 F (37.4 C) Oral 95 20 94 %    Filed Weights   12/29/15 0759 12/29/15 1957  Weight: 178 lb (80.74 kg) 179 lb 0.2 oz (81.2 kg)    Hemodynamic parameters for last 24 hours:    Intake/Output from previous day: 02/08 0701 - 02/09 0700 In: 848 [P.O.:598; IV Piggyback:250] Out: 2000 [Urine:2000] Intake/Output this shift: Total I/O In: 120 [P.O.:120] Out: 650 [Urine:650]  Scheduled Meds: .  stroke: mapping our early stages of recovery book   Does not apply Once  . amLODipine  10 mg Oral Daily  . antiseptic oral rinse  7 mL Mouth Rinse BID  . aspirin  81 mg Oral Daily  . brinzolamide  1 drop Both Eyes TID   And  . brimonidine  1 drop Both Eyes TID  . calcium-vitamin D  1 tablet Oral Daily  . famotidine  20 mg Oral Daily  . feeding supplement (ENSURE ENLIVE)  237 mL Oral BID BM  . isosorbide mononitrate  30 mg Oral Daily  . latanoprost  1 drop Both Eyes QHS  . metoCLOPramide (REGLAN) injection  10 mg Intravenous 4 times per day  . mupirocin ointment  1 application Nasal BID  . nafcillin IV  2 g Intravenous Q4H  . pantoprazole  40 mg Oral BID  . sertraline  100 mg Oral Daily  . simvastatin   10 mg Oral QHS   Continuous Infusions: . sodium chloride 10 mL/hr at 01/10/16 0737   PRN Meds:.acetaminophen **OR** acetaminophen, albuterol, gi cocktail, haloperidol lactate, iohexol, iohexol, LORazepam, morphine injection, nitroGLYCERIN, ondansetron, polyvinyl alcohol  Exam- unchanged  from two days ago, no murmur   Lab Results: CBC:  Recent Labs  01/10/16 0553 01/11/16 0517  WBC 17.8* 17.1*  HGB 7.6* 11.3*  HCT 23.5* 33.0*  PLT 355 398   BMET:   Recent Labs  01/11/16 0517 01/12/16 0711  NA 135 133*  K 3.1* 3.8  CL 99* 100*  CO2 28 25  GLUCOSE 99 88  BUN 10 10  CREATININE 0.84 0.77  CALCIUM 7.8* 7.6*    PT/INR: No results for input(s): LABPROT, INR in the last 72 hours. Wound culture     Status: None   Collection Time: 01/03/16  6:43 PM  Result Value Ref Range Status   Specimen Description WOUND LEFT FOOT  Final   Special Requests NONE  Final   Gram Stain  Final    NO WBC SEEN NO SQUAMOUS EPITHELIAL CELLS SEEN NO ORGANISMS SEEN Performed at [facility]    Culture   Final    MODERATE STAPHYLOCOCCUS AUREUS Note: RIFAMPIN AND GENTAMICIN SHOULD NOT BE USED AS SINGLE DRUGS FOR TREATMENT OF STAPH INFECTIONS. Performed at [facility]    Report Status 01/06/2016 FINAL  Final   Organism ID, Bacteria STAPHYLOCOCCUS AUREUS  Final      Susceptibility   Staphylococcus aureus - MIC*    CLINDAMYCIN <=0.25 SENSITIVE Sensitive     ERYTHROMYCIN <=0.25 SENSITIVE Sensitive     GENTAMICIN <=0.5 SENSITIVE Sensitive     LEVOFLOXACIN <=0.12 SENSITIVE Sensitive     OXACILLIN <=0.25 SENSITIVE Sensitive     RIFAMPIN <=0.5 SENSITIVE Sensitive     TRIMETH/SULFA <=10 SENSITIVE Sensitive     VANCOMYCIN 1 SENSITIVE Sensitive     TETRACYCLINE <=1 SENSITIVE Sensitive     MOXIFLOXACIN <=0.25 SENSITIVE Sensitive     * MODERATE STAPHYLOCOCCUS AUREUS  Culture, blood (routine x 2)     Status: None (Preliminary result)   Collection Time: 01/06/16  4:55 PM  Result  Value Ref Range Status   Specimen Description BLOOD LEFT ANTECUBITAL  Final   Special Requests BOTTLES DRAWN AEROBIC AND ANAEROBIC 10CC  Final   Culture NO GROWTH < 24 HOURS  Final   Report Status PENDING  Incomplete  Culture, blood (routine x 2)     Status: None (Preliminary result)   Collection Time: 01/06/16  4:59 PM  Result Value Ref Range Status   Specimen Description BLOOD BLOOD LEFT HAND  Final   Special Requests BOTTLES DRAWN AEROBIC AND ANAEROBIC 10CC  Final   Culture NO GROWTH < 24 HOURS  Final   Report Status PENDING  Incomplete   Recent Results (from the past 240 hour(s))  Culture, blood (Routine X 2) w Reflex to ID Panel     Status: None   Collection Time: 01/06/16  4:59 PM  Result Value Ref Range Status   Specimen Description BLOOD BLOOD LEFT HAND  Final   Special Requests BOTTLES DRAWN AEROBIC AND ANAEROBIC 10CC  Final   Culture NO GROWTH 5  DAYS  Final   Report Status PENDING  Incomplete   Radiology MRI of foot 1/31//2017 CLINICAL DATA: Bacteremia. Open wounds on the left foot with history of amputation.  EXAM: MRI OF THE LEFT FOREFOOT WITHOUT CONTRAST  TECHNIQUE: Multiplanar, multisequence MR imaging was performed. No intravenous contrast was administered.  COMPARISON: 01/02/2016; 09/15/2014  FINDINGS: The patient refused IV contrast. Despite efforts by the technologist and patient, severe motion artifact is present on today's exam and could not be eliminated. This reduces exam sensitivity and specificity.  First digit amputation at the MTP joint, with a approximately 1.5 by 0.8 by 0.8 cm fluid collection just plantar to the head of the first metatarsal as shown on images 27-32 of series 10, and surrounding abnormal inflammatory effacement of the adipose tissues. Abscess not excluded. Low signal intensity focus on image 33 series 9 possibly a tiny microscopic metallic particle or tiny focus of gas along the amputation margin.  As on  conventional radiographs, we demonstrate erosive findings in the head of the second and third metatarsals as well as in the bases of the proximal phalanges of the second and third toes. There is some low-level increased T2 signal along the third metatarsal head as on image 27 series 10  Low-level edema tracks along the plantar musculature of the foot. There  is some mild subcutaneous edema dorsally in the foot.  Degenerative findings along the Lisfranc joint. No Lisfranc joint malalignment identified.  IMPRESSION: 1. Erosive findings at the second and third metatarsophalangeal joints are confirmed. This could represent infection, gout, or rheumatoid arthropathy. Particularly in the head of the third metatarsal, osteomyelitis is of greater suspicion. Today's images are severely degraded by motion artifact, and the patient refused IV contrast. 2. Fluid collection plantar to the head of the first metatarsal -abscess not excluded given the inflammatory effacement of the surrounding fatty subcutaneous tissues.   Electronically Signed  By: Gaylyn Rong M.D.  On: 01/03/2016 08:41  Assessment/Plan: S/P Procedure(s) (LRB): AMPUTATION TRANSMETATARSAL LEFT FOOT and achilles tendon lengthening (Left) See previous note yesterday Combination of physical findings , MRI of left foot and positive culture of foot makes left foot chronic infection most likely source of seeding mitral valve. Would favor control of primary source and then  continued antibiotic therpy  for mitral endocarditis and hopefully can clear without valve replacement/repair. Both blood culture from 2/3 no growth so far x  5 days No definate indication for cardiac surgery at this point Consider pic line for ling term antibiotics  Generally accepted indications for surgical treatment of endocarditis:  Valve abnormalities or regurgitation resulting in congestive heart failure Microorganisms that are not controlled  by antimicrobial therapy (fungal) Endocarditis leading to valve dehiscence, perforation, rupture or fistula or large perivalvular abscess, recurrent emboli Persistent vegetation or fever/bacteremia despite optimal treatment vegetations  that are mobile and larger then>10 mm in diameter on the mitral valve vegetations that are increasing in size despite antimicrobial therapy Mitral "kissing" vegetation  Delight Ovens MD 01/12/2016 3:22 PM

## 2016-01-12 NOTE — Progress Notes (Signed)
CSW spoke with patient and patient's daughter at bedside. Patient stated that she would like to go home with home health and daughter expressed agreement with plan. Patient has good family support. CSW will also request a home social worker in case the family is not able to care for patient once they are home.   CSW signing off.  Osborne Casco Darien Mignogna LCSWA [phone]

## 2016-01-12 NOTE — Care Management Note (Signed)
Case Management Note  Patient Details  Name: Cassandra [name] MRN: [id number] Date of Birth: 1933-11-04  Subjective/Objective:                 Spoke with patient and granddaughter in room and granddaughter Cassandra [name] over the phone. They state that patient and family would like to go home at discharge. There will be 24 hour supervision provided by husband, daughters and granddaughters. Two family members are nurse aides or training to be. Family stated they will provide walker, and it will be at home when she arrives. Verified with bith granddaughters. Patient denies any assistance with transportation or medications. Patient is to have PICC line placed tomorrow morning. Family members are eager to have education. IV Abx will be provided through [facility]. HH will be provided through [facility].   Action/Plan:  Referrals made to [facility].  Expected Discharge Date:                  Expected Discharge Plan:  Home w Home Health Services  In-House Referral:  Clinical Social Work  Discharge planning Services  CM Consult  Post Acute Care Choice:  Home Health Choice offered to:  Patient, Adult Children  DME Arranged:    DME Agency:     HH Arranged:  RN, PT, IV Antibiotics, Nurse's Aide HH Agency:  [facility]  Status of Service:  In process, will continue to follow  Medicare Important Message Given:  Yes Date Medicare IM Given:    Medicare IM give by:    Date Additional Medicare IM Given:    Additional Medicare Important Message give by:     If discussed at Long Length of Stay Meetings, dates discussed:    Additional Comments:  Lawerance Sabal, RN 01/12/2016, 3:19 PM

## 2016-01-12 NOTE — Progress Notes (Signed)
Inpatient Rehabilitation  Met with patient and daughter at bedside to discuss post acute rehab options.  Per yesterday's therapy notes patient not demonstrating the ability to tolerate IP Rehab's level of intensity.  Additionally, patient states that she has dicussed it with her family and is in favor of going home with home health care.  Daughter present and in agreement with patient's plan.  Notified CSW; will sign off at this time.  Carmelia Roller., CCC/SLP Admission Coordinator  [facility]  Cell [phone]

## 2016-01-12 NOTE — Care Management Important Message (Signed)
Important Message  Patient Details  Name: Cassandra [name] MRN: [id number] Date of Birth: 09-07-1933   Medicare Important Message Given:  Yes    Bernadette Hoit 01/12/2016, 8:34 AM

## 2016-01-13 DIAGNOSIS — N3020 Other chronic cystitis without hematuria: Secondary | ICD-10-CM | POA: Diagnosis not present

## 2016-01-13 DIAGNOSIS — Z4789 Encounter for other orthopedic aftercare: Secondary | ICD-10-CM | POA: Diagnosis not present

## 2016-01-13 DIAGNOSIS — M869 Osteomyelitis, unspecified: Secondary | ICD-10-CM | POA: Diagnosis not present

## 2016-01-13 DIAGNOSIS — R7881 Bacteremia: Secondary | ICD-10-CM | POA: Diagnosis not present

## 2016-01-13 DIAGNOSIS — M86072 Acute hematogenous osteomyelitis, left ankle and foot: Secondary | ICD-10-CM | POA: Diagnosis not present

## 2016-01-13 DIAGNOSIS — I6340 Cerebral infarction due to embolism of unspecified cerebral artery: Secondary | ICD-10-CM | POA: Diagnosis not present

## 2016-01-13 DIAGNOSIS — A419 Sepsis, unspecified organism: Secondary | ICD-10-CM | POA: Diagnosis not present

## 2016-01-13 DIAGNOSIS — I2690 Septic pulmonary embolism without acute cor pulmonale: Secondary | ICD-10-CM | POA: Diagnosis not present

## 2016-01-13 DIAGNOSIS — Z89432 Acquired absence of left foot: Secondary | ICD-10-CM | POA: Diagnosis not present

## 2016-01-13 DIAGNOSIS — R109 Unspecified abdominal pain: Secondary | ICD-10-CM | POA: Diagnosis not present

## 2016-01-13 DIAGNOSIS — D735 Infarction of spleen: Secondary | ICD-10-CM | POA: Diagnosis not present

## 2016-01-13 DIAGNOSIS — R0989 Other specified symptoms and signs involving the circulatory and respiratory systems: Secondary | ICD-10-CM | POA: Diagnosis not present

## 2016-01-13 DIAGNOSIS — S91302A Unspecified open wound, left foot, initial encounter: Secondary | ICD-10-CM | POA: Insufficient documentation

## 2016-01-13 DIAGNOSIS — Z89439 Acquired absence of unspecified foot: Secondary | ICD-10-CM | POA: Diagnosis not present

## 2016-01-13 DIAGNOSIS — I10 Essential (primary) hypertension: Secondary | ICD-10-CM | POA: Diagnosis not present

## 2016-01-13 DIAGNOSIS — Z89422 Acquired absence of other left toe(s): Secondary | ICD-10-CM | POA: Diagnosis not present

## 2016-01-13 DIAGNOSIS — M89679 Osteopathy after poliomyelitis, unspecified ankle and foot: Secondary | ICD-10-CM | POA: Diagnosis not present

## 2016-01-13 DIAGNOSIS — R338 Other retention of urine: Secondary | ICD-10-CM | POA: Diagnosis not present

## 2016-01-13 DIAGNOSIS — F329 Major depressive disorder, single episode, unspecified: Secondary | ICD-10-CM | POA: Diagnosis not present

## 2016-01-13 DIAGNOSIS — I639 Cerebral infarction, unspecified: Secondary | ICD-10-CM | POA: Diagnosis not present

## 2016-01-13 DIAGNOSIS — N1330 Unspecified hydronephrosis: Secondary | ICD-10-CM | POA: Diagnosis not present

## 2016-01-13 DIAGNOSIS — M6281 Muscle weakness (generalized): Secondary | ICD-10-CM | POA: Diagnosis not present

## 2016-01-13 DIAGNOSIS — A4101 Sepsis due to Methicillin susceptible Staphylococcus aureus: Secondary | ICD-10-CM | POA: Diagnosis not present

## 2016-01-13 DIAGNOSIS — R4182 Altered mental status, unspecified: Secondary | ICD-10-CM | POA: Insufficient documentation

## 2016-01-13 DIAGNOSIS — R310 Gross hematuria: Secondary | ICD-10-CM | POA: Diagnosis not present

## 2016-01-13 DIAGNOSIS — I330 Acute and subacute infective endocarditis: Secondary | ICD-10-CM | POA: Diagnosis not present

## 2016-01-13 DIAGNOSIS — E785 Hyperlipidemia, unspecified: Secondary | ICD-10-CM | POA: Diagnosis not present

## 2016-01-13 DIAGNOSIS — R339 Retention of urine, unspecified: Secondary | ICD-10-CM | POA: Diagnosis not present

## 2016-01-13 LAB — CBC
HEMATOCRIT: 31.9 % — AB (ref 36.0–46.0)
HEMOGLOBIN: 10.1 g/dL — AB (ref 12.0–15.0)
MCH: 28.6 pg (ref 26.0–34.0)
MCHC: 31.7 g/dL (ref 30.0–36.0)
MCV: 90.4 fL (ref 78.0–100.0)
Platelets: 463 10*3/uL — ABNORMAL HIGH (ref 150–400)
RBC: 3.53 MIL/uL — AB (ref 3.87–5.11)
RDW: 14.8 % (ref 11.5–15.5)
WBC: 11.8 10*3/uL — ABNORMAL HIGH (ref 4.0–10.5)

## 2016-01-13 LAB — BASIC METABOLIC PANEL
ANION GAP: 8 (ref 5–15)
BUN: 9 mg/dL (ref 6–20)
CHLORIDE: 99 mmol/L — AB (ref 101–111)
CO2: 27 mmol/L (ref 22–32)
Calcium: 7.9 mg/dL — ABNORMAL LOW (ref 8.9–10.3)
Creatinine, Ser: 0.86 mg/dL (ref 0.44–1.00)
GFR calc non Af Amer: >60 mL/min (ref >60)
GLUCOSE: 118 mg/dL — AB (ref 65–99)
Potassium: 3.3 mmol/L — ABNORMAL LOW (ref 3.5–5.1)
Sodium: 134 mmol/L — ABNORMAL LOW (ref 135–145)

## 2016-01-13 LAB — GLUCOSE, CAPILLARY: Glucose-Capillary: 136 mg/dL — ABNORMAL HIGH (ref 65–99)

## 2016-01-13 MED ORDER — TRAMADOL HCL 50 MG PO TABS: 50.0000 mg | ORAL_TABLET | Freq: Four times a day (QID) | ORAL | Status: DC | PRN

## 2016-01-13 MED ORDER — RANITIDINE HCL 300 MG PO TABS: 300.0000 mg | ORAL_TABLET | Freq: Every day | ORAL | Status: DC

## 2016-01-13 MED ORDER — ENSURE ENLIVE PO LIQD: 237.0000 mL | Freq: Two times a day (BID) | ORAL | Status: DC

## 2016-01-13 MED ORDER — PANTOPRAZOLE SODIUM 40 MG PO TBEC: 40.0000 mg | DELAYED_RELEASE_TABLET | Freq: Two times a day (BID) | ORAL | Status: DC

## 2016-01-13 MED ORDER — ISOSORBIDE MONONITRATE ER 30 MG PO TB24: 30.0000 mg | ORAL_TABLET | Freq: Every day | ORAL | Status: DC

## 2016-01-13 MED ORDER — NAFCILLIN SODIUM 2 G IJ SOLR: 2.0000 g | INTRAVENOUS | Status: DC

## 2016-01-13 MED ORDER — SODIUM CHLORIDE 0.9% FLUSH: 10.0000 mL | INTRAVENOUS | Status: DC | PRN

## 2016-01-13 NOTE — Clinical Social Work Placement (Signed)
   CLINICAL SOCIAL WORK PLACEMENT  NOTE  Date:  01/13/2016  Patient Details  Name: Cassandra [name] MRN: [id number] Date of Birth: 01-09-1933  Clinical Social Work is seeking post-discharge placement for this patient at the Skilled  Nursing Facility level of care (*CSW will initial, date and re-position this form in  chart as items are completed):  Yes   Patient/family provided with [facility] Clinical Social Work Department's list of facilities offering this level of care within the geographic area requested by the patient (or if unable, by the patient's family).  Yes   Patient/family informed of their freedom to choose among providers that offer the needed level of care, that participate in Medicare, Medicaid or managed care program needed by the patient, have an available bed and are willing to accept the patient.  Yes   Patient/family informed of [facility]'s ownership interest in [facility] and [facility], as well as of the fact that they are under no obligation to receive care at these facilities.  PASRR submitted to EDS on       PASRR number received on 01/13/16     Existing PASRR number confirmed on       FL2 transmitted to all facilities in geographic area requested by pt/family on 01/13/16     FL2 transmitted to all facilities within larger geographic area on       Patient informed that his/her managed care company has contracts with or will negotiate with certain facilities, including the following:        Yes   Patient/family informed of bed offers received.  Patient chooses bed at [facility]     Physician recommends and patient chooses bed at      Patient to be transferred to [facility] on 01/13/16.  Patient to be transferred to facility by [facility]     Patient family notified on 01/13/16 of transfer.  Name of family member notified:  Brooke     PHYSICIAN       Additional Comment:     _______________________________________________ Mearl Latin, LCSWA 01/13/2016, 5:20 PM

## 2016-01-13 NOTE — NC FL2 (Signed)
MEDICAID FL2 LEVEL OF CARE SCREENING TOOL     IDENTIFICATION  Patient Name: Cassandra [name] Birthdate: Oct 03, 1933 Sex: female Admission Date (Current Location): 12/29/2015  [facility] and [place] Number:  Producer, television/film/video and Address:  [facility], [address] [place], [place] [address]      Provider Number: [id number]  Attending Physician Name and Address:  Vassie Loll, MD  Relative Name and Phone Number:  Cephus Shelling, [phone]    Current Level of Care: Hospital Recommended Level of Care: Skilled Nursing Facility Prior Approval Number:    Date Approved/Denied:   PASRR Number: [id number]  Discharge Plan: SNF    Current Diagnoses: Patient Active Problem List   Diagnosis Date Noted  . Chest tightness 01/08/2016  . Chest pain   . Shortness of breath   . Loose stools   . Chronic osteomyelitis of left foot (HCC)   . Cerebral embolism with cerebral infarction 01/06/2016  . Bacterial endocarditis   . Sepsis (HCC) 01/05/2016  . Bacteremia   . Diminished pulses in lower extremity   . Emesis   . Staphylococcus aureus bacteremia 01/01/2016  . Gastroenteritis 12/29/2015  . Glaucoma 12/29/2015  . Normocytic anemia 09/17/2014  . Septic arthritis of interphalangeal joint of toe of left foot (HCC) 09/16/2014  . Cellulitis and abscess of toe of left foot 09/16/2014  . Osteomyelitis of toe of left foot (HCC) 09/16/2014  . Hypertension   . Hyperlipidemia   . PFO (patent foramen ovale)   . H/O echocardiogram   . Heart palpitations     Orientation RESPIRATION BLADDER Height & Weight     Self, Time, Situation, Place  Normal Incontinent, Indwelling catheter (Urinary catheter) Weight: 179 lb 0.2 oz (81.2 kg) Height:  5' 9.5" (176.5 cm)  BEHAVIORAL SYMPTOMS/MOOD NEUROLOGICAL BOWEL NUTRITION STATUS   (N/A)   Incontinent  (Please see DC summary)  AMBULATORY STATUS COMMUNICATION OF NEEDS Skin   Extensive Assist Verbally  Surgical wounds (Closed incision on leg)                       Personal Care Assistance Level of Assistance  Bathing, Feeding, Dressing Bathing Assistance: Maximum assistance Feeding assistance: Independent Dressing Assistance: Limited assistance     Functional Limitations Info  Sight Sight Info: Impaired        SPECIAL CARE FACTORS FREQUENCY  PT (By licensed PT), OT (By licensed OT)     PT Frequency: min 3x/week OT Frequency: min 2x/week            Contractures      Additional Factors Info  Code Status, Allergies, Psychotropic Code Status Info: Full Allergies Info: Codeine, Hydrocodone Psychotropic Info: Zoloft         Current Medications (01/13/2016):  This is the current hospital active medication list Current Facility-Administered Medications  Medication Dose Route Frequency Provider Last Rate Last Dose  .  stroke: mapping our early stages of recovery book   Does not apply Once Jerald Kief, MD      . 0.9 %  sodium chloride infusion   Intravenous Continuous Toni Arthurs, MD 10 mL/hr at 01/10/16 [phone]    . acetaminophen (TYLENOL) tablet 650 mg  650 mg Oral Q6H PRN Starleen Arms, MD   650 mg at 01/12/16 2314   Or  . acetaminophen (TYLENOL) suppository 650 mg  650 mg Rectal Q6H PRN Starleen Arms, MD      . albuterol (  PROVENTIL) (2.5 MG/3ML) 0.083% nebulizer solution 2.5 mg  2.5 mg Nebulization Q6H PRN Erick Blinks, MD   2.5 mg at 01/03/16 1211  . amLODipine (NORVASC) tablet 10 mg  10 mg Oral Daily Jeralyn Bennett, MD   10 mg at 01/13/16 0851  . antiseptic oral rinse (CPC / CETYLPYRIDINIUM CHLORIDE 0.05%) solution 7 mL  7 mL Mouth Rinse BID Starleen Arms, MD   7 mL at 01/13/16 0856  . aspirin chewable tablet 81 mg  81 mg Oral Daily Layne Benton, NP   81 mg at 01/13/16 0851  . brinzolamide (AZOPT) 1 % ophthalmic suspension 1 drop  1 drop Both Eyes TID Sherron Monday, RPH   1 drop at 01/13/16 0856   And  . brimonidine (ALPHAGAN) 0.2 % ophthalmic  solution 1 drop  1 drop Both Eyes TID Sherron Monday, RPH   1 drop at 01/13/16 0960  . calcium-vitamin D (OSCAL WITH D) 500-200 MG-UNIT per tablet 1 tablet  1 tablet Oral Daily Starleen Arms, MD   1 tablet at 01/13/16 0850  . famotidine (PEPCID) tablet 20 mg  20 mg Oral Daily Jeralyn Bennett, MD   20 mg at 01/13/16 [phone]  . feeding supplement (ENSURE ENLIVE) (ENSURE ENLIVE) liquid 237 mL  237 mL Oral BID BM Jeralyn Bennett, MD   237 mL at 01/13/16 0846  . gi cocktail (Maalox,Lidocaine,Donnatal)  30 mL Oral TID PRN Maretta Bees, MD   30 mL at 01/06/16 0754  . haloperidol lactate (HALDOL) injection 2 mg  2 mg Intravenous Q6H PRN Erick Blinks, MD      . iohexol (OMNIPAQUE) 300 MG/ML solution 25 mL  25 mL Oral Once PRN Jeralyn Bennett, MD      . iohexol (OMNIPAQUE) 300 MG/ML solution 25 mL  25 mL Oral Once PRN Jeralyn Bennett, MD      . isosorbide mononitrate (IMDUR) 24 hr tablet 30 mg  30 mg Oral Daily Chilton Si, MD   30 mg at 01/13/16 0851  . latanoprost (XALATAN) 0.005 % ophthalmic solution 1 drop  1 drop Both Eyes QHS Starleen Arms, MD   1 drop at 01/12/16 2306  . LORazepam (ATIVAN) injection 0.5 mg  0.5 mg Intravenous Q6H PRN Jeralyn Bennett, MD   0.5 mg at 01/06/16 1511  . metoCLOPramide (REGLAN) injection 10 mg  10 mg Intravenous 4 times per day Maretta Bees, MD   10 mg at 01/13/16 0555  . morphine 2 MG/ML injection 1 mg  1 mg Intravenous Q4H PRN Jerald Kief, MD   1 mg at 01/11/16 1313  . nafcillin 2 g in dextrose 5 % 100 mL IVPB  2 g Intravenous Q4H Erick Blinks, MD   2 g at 01/13/16 0846  . nitroGLYCERIN (NITROSTAT) SL tablet 0.4 mg  0.4 mg Sublingual Q5 min PRN Maretta Bees, MD   0.4 mg at 01/08/16 0802  . ondansetron (ZOFRAN) 4 MG/5ML solution 4 mg  4 mg Oral Q6H PRN Leana Roe Elgergawy, MD      . pantoprazole (PROTONIX) EC tablet 40 mg  40 mg Oral BID Jeralyn Bennett, MD   40 mg at 01/13/16 0851  . polyvinyl alcohol (LIQUIFILM TEARS) 1.4 % ophthalmic  solution 1 drop  1 drop Both Eyes TID PRN Starleen Arms, MD      . sertraline (ZOLOFT) tablet 100 mg  100 mg Oral Daily Jeralyn Bennett, MD   100 mg at 01/13/16 0850  . simvastatin (  ZOCOR) tablet 10 mg  10 mg Oral QHS Marvel Plan, MD   10 mg at 01/12/16 2307     Discharge Medications: Please see discharge summary for a list of discharge medications.  Relevant Imaging Results:  Relevant Lab Results:   Additional Information SSN [id number]  Mearl Latin, LCSWA

## 2016-01-13 NOTE — Discharge Summary (Signed)
Physician Discharge Summary  Cassandra [name] WJX:[id number] DOB: January 20, 1933 DOA: 12/29/2015  PCP: Cassandra Ribas, MD  Admit date: 12/29/2015 Discharge date: 01/13/2016  Time spent: 35 minutes  Recommendations for Outpatient Follow-up:  1. CBC weekly 2. BMET twice a week 3. Please reassess BP and adjust antihypertensive regimen as needed   Discharge Diagnoses:  Principal Problem:   Sepsis (HCC) Active Problems:   Hypertension   Hyperlipidemia   Gastroenteritis   Glaucoma   Staphylococcus aureus bacteremia   Diminished pulses in lower extremity   Emesis   Cerebral embolism with cerebral infarction   Bacterial endocarditis   Chronic osteomyelitis of left foot (HCC)   Chest tightness   Chest pain   Shortness of breath   Loose stools   Altered mental status   Open wound of left foot   Discharge Condition: stable and improved. Will discharge to SNF for IV antibiotics, further care and physical rehabilitation. Follow up with ID service in approx 4 weeks and with CVTS after completion of antibiotics therapy. Will follow up with orthopedic surgery as instructed.  Diet recommendation: heart healthy diet   Filed Weights   12/29/15 0759 12/29/15 1957  Weight: 80.74 kg (178 lb) 81.2 kg (179 lb 0.2 oz)    History of present illness:  Cassandra [name] is a 82 y.o. female, with past medical history of hypertension, arthritis, hyperlipidemia, left leg chronic ulcer, presents with complaints of generalized weakness, right flank pain, nausea, vomiting, diarrhea, over last 48 hours, patient reports she's been feeling ill over last 48 hours, poor appetite, generalized weakness, fever and feeling feverish,, had vomiting started yesterday, as well diarrhea, soft stool, denies any coffee-ground emesis, any leg or bright red blood per rectum, she is afebrile in ED, workup significant for leukocytosis, giving her right flank pain, she had renal stone CT protocol and CT abdomen pelvis with IV  contrast with no acute findings, patient reports she had laser procedure done by her ophthalmologist secondary to glaucoma at his office yesterday, reports she's been feeling ill before that, denies any eye pain, or blurry vision or visual changes, hospitalist requested to admit the patient for her clinical dehydration.   Hospital Course:  1. Sepsis/Endocarditis. Present on admission. Evidenced by blood cultures from 12/31/2015 growing MSSA, white count of 25,200, having a temp of 101.7 on admission. Case was discussed with Dr. Ilsa Iha of infectious disease. She is currently on Nafcillin 2 g IV every 4 hours. With MRI of brain showing multiple scattered infarcts/concern for septic emboli. She underwent a transesophageal echocardiogram on 01/05/2016 which revealed large hypermobile mitral valve vegetation. Source of bacteremia felt to be left foot osteomyelitis. CT surgery consulted for recommendations on surgical intervention. CT surgery and ID, recommended consulting orthopedic surgery to address left foot infection prior to any cardiothoracic intervention. On 01/08/2016 she was taken to the OR where she underwent transmetatarsal amputation of left foot osteo. Repeat blood cultures from 01/06/2016 showing no growth to date.  -On 01/10/2016 case discussed with Dr. Drue Second of infectious disease recommending 6 weeks of IV antibiotic therapy. They will follow her up in approx 4 weeks. -CVTS will reassess after antibiotics therapy completed to decide at that time needs for surgery/valvular repair.   2. Septic emboli- MRI performed 01/04/2016 showed Multiple (approximately 6) scattered punctate acute lacunar infarcts in the brain. Left MCA, bilateral PCA, and SCA territories are affected. This was further workup with transesophageal echocardiogram that revealed mitral valve vegetation. She is on IV antibiotic therapy with nafcillin.  CT surgery consulted to determine if she is a candidate for surgical intervention.  From a neurologic standpoint she seems stable.  -Plan is to continue antibiotics for now and reassess after abx treatment need for valvular surgery.  3. Chronic left foot wound/osteomyelitis . MRI of foot from 01/03/2016 reported by radiology to show possible underlying osteomyelitis, cannot rule out underlying abscess. This was suspected to be source of bacteremia. Podiatry consulted at [facility], she was evaluated by Dr.McKinney. Wound culture grew MSSA. On 01/07/2016 Case discussed with Dr Shon Baton of Orthopedic Surgery for evaluation of foot and MRI findings. On 01/08/2016 she underwent transmetatarsal amputation of left foot for chronic osteomyelitis. Per ID rec's will continue antibiotics for a total of 6 weeks (last day March 20).  -PICC placement completed on 01/13/16 -outpatient follow up with orthopedic service as an outpatient   4. Splenic infarct. Patient complaining of abdominal pain on 01/09/2016, further worked up with CT scan of abdomen and pelvis with IV contrast, study revealed splenic infarct involving 30%, likely secondary to septic emboli.  -Continue IV antibiotics.   5. Urinary retention, CT scan from 01/10/2016 showing bilateral hydronephrosis without evidence of distal obstruction. -A Foley catheter was placed with immediate drainage of 1.4 L of urine.  -due to failure of voiding trial, Plan is to keep foley and pursuit voding trial at the facility after some rehab and if needed outpatient urology referral  6. Acute anemia/presumed to be secondary to blood loss with surgery. Lab work on 01/10/2016 showing downward trend in hemoglobin to 7.6 from 9.5 on 01/09/2016. Status post 2 units of PRBC's. -since transfusion Hgb stable -follow CBC as an outpatient to reassess trend  7. HTN, blood pressures improved, plan to continue atenolol, cozaar and Imdur  -encourage to follow low sodium diet    8.HLD, continue statin  Procedures:  ECHO Study Conclusions- Left  ventricle: The cavity size was normal. Systolic function was vigorous. The estimated ejection fraction was in the range of 65% to 70%. Wall motion was normal; there were no regional wall motion abnormalities. Doppler parameters are consistent with abnormal left ventricular relaxation (grade 1 diastolic dysfunction). There was no evidence of elevated ventricular filling pressure by Doppler parameters. - Aortic valve: Trileaflet; normal thickness leaflets. There was no regurgitation. - Aortic root: The aortic root was normal in size. - Mitral valve: Structurally normal valve. There was mild regurgitation. - Left atrium: The atrium was mildly dilated. - Right ventricle: Systolic function was normal. - Tricuspid valve: There was mild regurgitation. - Pulmonic valve: There was no regurgitation. - Pulmonary arteries: Systolic pressure was moderately increased. PA peak pressure: 48 mm Hg (S). - Inferior vena cava: The vessel was normal in size. - Pericardium, extracardiac: There was no pericardial effusion.  Consultations:  PT - SNF  Infectious Disease  Podiatry  Neurology  Cardiology  Orthopedic surgery  Discharge Exam: Filed Vitals:   01/13/16 0521 01/13/16 1425  BP: 147/68 168/73  Pulse: 95 93  Temp: 98 F (36.7 C) 98.2 F (36.8 C)  Resp: 20 20    General: NAD, afebrile. In no acute distress. Sitting on the bed. Denies CP and SOB.   Cardiovascular: Regular rate and rhythm. I-II/VI systolic murmur heard best at aortic and pulmonic area   Respiratory: CTAB. No wheezes, rales, or rhonchi.   Abdomen: soft, non tender, no distention, bowel sounds normal  Musculoskeletal: She has 1+ bilateral lower extremity pitting edema, status post transmetatarsal amputation to left foot. Foot with clean  dressing in place   Discharge Instructions   Discharge Instructions    Diet - low sodium heart healthy    Complete by:  As directed      Discharge  instructions    Complete by:  As directed   Medications as prescribed Please follow up with Dr. Ilsa Iha (infectious disease service) as instructed Plan is for antibiotics until February 20, 2016 Patient will need CBC weekly and BMET twice a week while on antibiotics Physical therapy as per SNF protocol Outpatient follow up with orthopedic service as instructed          Current Discharge Medication List    START taking these medications   Details  feeding supplement, ENSURE ENLIVE, (ENSURE ENLIVE) LIQD Take 237 mLs by mouth 2 (two) times daily between meals. Qty: 237 mL, Refills: 12    isosorbide mononitrate (IMDUR) 30 MG 24 hr tablet Take 1 tablet (30 mg total) by mouth daily.    nafcillin 2 g in dextrose 5 % 100 mL Inject 2 g into the vein every 4 (four) hours.    ondansetron (ZOFRAN ODT) 4 MG disintegrating tablet 4mg  ODT q4 hours prn nausea/vomit Qty: 4 tablet, Refills: 0    pantoprazole (PROTONIX) 40 MG tablet Take 1 tablet (40 mg total) by mouth 2 (two) times daily.      CONTINUE these medications which have CHANGED   Details  ranitidine (ZANTAC) 300 MG tablet Take 1 tablet (300 mg total) by mouth at bedtime.    traMADol (ULTRAM) 50 MG tablet Take 1 tablet (50 mg total) by mouth every 6 (six) hours as needed for severe pain. Take one or two tablets by mouth every 6 hours as needed for pain Qty: 20 tablet, Refills: 0      CONTINUE these medications which have NOT CHANGED   Details  aspirin 81 MG chewable tablet Chew 81 mg by mouth daily.    atenolol (TENORMIN) 100 MG tablet Take 1 tablet by mouth daily.    Biotin 5000 MCG CAPS Take 10,000 mcg by mouth daily.     Calcium Carbonate-Vitamin D (CALCIUM + D) 600-200 MG-UNIT TABS Take 1 tablet by mouth daily.    carboxymethylcellulose (REFRESH PLUS) 0.5 % SOLN Place 1 drop into both eyes 3 (three) times daily as needed (Dry Eyes).    Cyanocobalamin (VITAMIN B-12 IJ) Inject as directed every 30 (thirty) days.    latanoprost  (XALATAN) 0.005 % ophthalmic solution Place 1 drop into both eyes at bedtime.    losartan (COZAAR) 100 MG tablet Take 100 mg by mouth daily. Refills: 11    Multiple Vitamins-Minerals (PRESERVISION AREDS PO) Take 1 tablet by mouth 2 (two) times daily.    sertraline (ZOLOFT) 100 MG tablet Take 100 mg by mouth daily.     SIMBRINZA 1-0.2 % SUSP Place 1 drop into both eyes 3 (three) times daily.    simvastatin (ZOCOR) 10 MG tablet Take 10 mg by mouth at bedtime.      STOP taking these medications     naproxen (NAPROSYN) 500 MG tablet      silver sulfADIAZINE (SILVADENE) 1 % cream        Allergies  Allergen Reactions  . Codeine Shortness Of Breath and Nausea And Vomiting  . Hydrocodone Shortness Of Breath and Nausea And Vomiting   Follow-up Information    Schedule an appointment as soon as possible for a visit with Cassandra Ribas, MD.   Specialty:  Family Medicine   Why:  in  10 days after discharge from [facility]   Contact information:   [address] [place] [address] [phone]       Follow up with [facility].   Why:  For home health PT RN HHA, and IV abx   Contact information:   [address] [place] [address] [phone]       Follow up with Toni Arthurs, MD. Schedule an appointment as soon as possible for a visit in 2 weeks.   Specialty:  Orthopedic Surgery   Contact information:   [address] [place] [address] [phone]       Follow up with [facility].   Specialty:  Skilled Nursing Facility   Contact information:   [address] [place] [address] [phone]      Call Judyann Munson, MD.   Specialty:  Infectious Diseases   Why:  office for appointment details; visit to be scheduled in approx 4 weeks   Contact information:   [address] [place] [address] [phone]       The results of significant diagnostics  from this hospitalization (including imaging, microbiology, ancillary and laboratory) are listed below for reference.    Significant Diagnostic Studies: Ct Angio Head W/cm &/or Wo Cm  01/06/2016  CLINICAL DATA:  Recent posterior fossa and left frontal lobe infarcts. Dull headache. Pain behind the right ear. EXAM: CT ANGIOGRAPHY HEAD AND NECK TECHNIQUE: Multidetector CT imaging of the head and neck was performed using the standard protocol during bolus administration of intravenous contrast. Multiplanar CT image reconstructions and MIPs were obtained to evaluate the vascular anatomy. Carotid stenosis measurements (when applicable) are obtained utilizing NASCET criteria, using the distal internal carotid diameter as the denominator. CONTRAST:  80mL OMNIPAQUE IOHEXOL 350 MG/ML SOLN COMPARISON:  MRI brain 01/04/2016. FINDINGS: CT HEAD Brain: Moderate periventricular and subcortical white matter hypo attenuation is again noted. The recent punctate infarcts are not evident by CT. The ventricles are of normal size. No significant extraaxial fluid collection is present. Calvarium and skull base: Within normal limits Paranasal sinuses: Clear Orbits: Bilateral lens replacements are present. The globes and orbits are otherwise intact. CTA NECK Aortic arch: A 3 vessel arch configuration is present. Atherosclerotic changes are present in the aortic arch. There is no significant stenosis. Right carotid system: There is mild tortuosity of the cervical right ICA. The right carotid bifurcation is normal. There is proximal tortuosity of the cervical right ICA. There is some irregularity and beading of the mid cervical right ICA without a significant stenosis. Left carotid system: The left common carotid artery is mildly tortuous. There is no significant focal stenosis. There is no significant stenosis at the bifurcation. There is some irregularity and beading of the mid cervical left ICA without significant focal stenosis.  Vertebral arteries:The vertebral arteries both originate from the subclavian arteries. Proximal tortuosity is present without significant stenosis. There is no focal stenosis of the vertebral arteries in the neck. Skeleton: Degenerative endplate change and osseous spurring is evident bilaterally at C6-7. There is slight degenerative anterolisthesis at C5-6. No focal lytic or blastic lesions are present. Other neck: No focal mucosal or submucosal lesions are present. The salivary glands are within normal limits. No significant adenopathy is present. There is some pleural thickening posteriorly in both lower lobes. Minimal atelectasis is present. Lungs are otherwise clear. The mediastinum is within normal limits. CTA HEAD Anterior circulation: Atherosclerotic changes are noted within the cavernous  internal carotid arteries bilaterally. There is a 50% supraclinoid stenosis bilaterally relative to the terminal ICA. No other significant focal stenosis is present. The right A1 segment is hypoplastic. There is a moderate stenosis of the mid right A1 segment. The left A1 segment is dominant in the anterior communicating artery is patent. The MCA bifurcations are intact bilaterally. Multiple mild segmental stenoses are present in the MCA and ACA branch vessels bilaterally. Moderate medium and distal small vessel stenoses are present in the MCA a bath branches bilaterally without a significant proximal occlusion. Posterior circulation: The left vertebral artery is slightly dominant to the right. The PICA origins are visualized and normal. The basilar artery is intact. Both posterior cerebral arteries originate from the basilar tip. There is moderate attenuation of distal PCA branch vessels. A high-grade stenosis is present in the proximal inferior right P3 division. Venous sinuses: The dural sinuses are patent. The straight sinus and deep cerebral veins are intact. The cortical veins are within normal limits. Anatomic  variants: None Delayed phase: The postcontrast images demonstrate no pathologic enhancement. IMPRESSION: 1. Punctate infarcts involving the left cerebellum and left frontal lobe are not visualized by CT. 2. No new or acute cortical infarct. 3. Moderate periventricular and subcortical white matter disease is again seen. 4. Atherosclerotic changes at the aortic arch without significant stenosis. 5. Approximately 50% stenosis within the supraclinoid internal carotid artery bilaterally. 6. Moderate medium and small vessel disease in both the anterior and posterior circulation. 7. Near occlusive stenosis in the inferior right P3 division. 8. Mild spondylosis of the cervical spine as described Electronically Signed   By: Marin Roberts M.D.   On: 01/06/2016 11:57   Dg Chest 2 View  12/29/2015  CLINICAL DATA:  RIGHT flank pain beginning yesterday, nausea, vomiting, hypertension EXAM: CHEST  2 VIEW COMPARISON:  05/22/2013 FINDINGS: Borderline enlargement of cardiac silhouette Atherosclerotic calcification aorta. Mediastinal contours stable. Minimal pulmonary vascular congestion. Accentuation of interstitial markings in the perihilar regions versus previous exam, cannot exclude minimal edema. No gross pleural effusion or pneumothorax. Bones unremarkable. IMPRESSION: Accentuated interstitial markings versus previous exam particularly in perihilar regions question minimal edema. Electronically Signed   By: Ulyses Southward M.D.   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 Dull headache. Pain behind the right ear. EXAM: CT ANGIOGRAPHY HEAD AND NECK TECHNIQUE: Multidetector CT imaging of the head and neck was performed using the standard protocol during bolus administration of intravenous contrast. Multiplanar CT image reconstructions and MIPs were obtained to evaluate the vascular anatomy. Carotid stenosis measurements (when  applicable) are obtained utilizing NASCET criteria, using the distal internal carotid diameter as the denominator. CONTRAST:  80mL OMNIPAQUE IOHEXOL 350 MG/ML SOLN COMPARISON:  MRI brain 01/04/2016. FINDINGS: CT HEAD Brain: Moderate periventricular and subcortical white matter hypo attenuation is again noted. The recent punctate infarcts are not evident by CT. The ventricles are of normal size. No significant extraaxial fluid collection is present. Calvarium and skull base: Within normal limits Paranasal sinuses: Clear Orbits: Bilateral lens replacements are present. The globes and orbits are otherwise intact. CTA NECK Aortic arch: A 3 vessel arch configuration is present. Atherosclerotic changes are present in the aortic arch. There is no significant stenosis. Right carotid system: There is mild tortuosity of the cervical right ICA. The right carotid bifurcation is normal. There is proximal tortuosity of the cervical right ICA. There is some irregularity and  beading of the mid cervical right ICA without a significant stenosis. Left carotid system: The left common carotid artery is mildly tortuous. There is no significant focal stenosis. There is no significant stenosis at the bifurcation. There is some irregularity and beading of the mid cervical left ICA without significant focal stenosis. Vertebral arteries:The vertebral arteries both originate from the subclavian arteries. Proximal tortuosity is present without significant stenosis. There is no focal stenosis of the vertebral arteries in the neck. Skeleton: Degenerative endplate change and osseous spurring is evident bilaterally at C6-7. There is slight degenerative anterolisthesis at C5-6. No focal lytic or blastic lesions are present. Other neck: No focal mucosal or submucosal lesions are present. The salivary glands are within normal limits. No significant adenopathy is present. There is some pleural thickening posteriorly in both lower lobes. Minimal  atelectasis is present. Lungs are otherwise clear. The mediastinum is within normal limits. CTA HEAD Anterior circulation: Atherosclerotic changes are noted within the cavernous internal carotid arteries bilaterally. There is a 50% supraclinoid stenosis bilaterally relative to the terminal ICA. No other significant focal stenosis is present. The right A1 segment is hypoplastic. There is a moderate stenosis of the mid right A1 segment. The left A1 segment is dominant in the anterior communicating artery is patent. The MCA bifurcations are intact bilaterally. Multiple mild segmental stenoses are present in the MCA and ACA branch vessels bilaterally. Moderate medium and distal small vessel stenoses are present in the MCA a bath branches bilaterally without a significant proximal occlusion. Posterior circulation: The left vertebral artery is slightly dominant to the right. The PICA origins are visualized and normal. The basilar artery is intact. Both posterior cerebral arteries originate from the basilar tip. There is moderate attenuation of distal PCA branch vessels. A high-grade stenosis is present in the proximal inferior right P3 division. Venous sinuses: The dural sinuses are patent. The straight sinus and deep cerebral veins are intact. The cortical veins are within normal limits. Anatomic variants: None Delayed phase: The postcontrast images demonstrate no pathologic enhancement. IMPRESSION: 1. Punctate infarcts involving the left cerebellum and left frontal lobe are not visualized by CT. 2. No new or acute cortical infarct. 3. Moderate periventricular and subcortical white matter disease is again seen. 4. Atherosclerotic changes at the aortic arch without significant stenosis. 5. Approximately 50% stenosis within the supraclinoid internal carotid artery bilaterally. 6. Moderate medium and small vessel disease in both the anterior and posterior circulation. 7. Near occlusive stenosis in the inferior right P3  division. 8. Mild spondylosis of the cervical spine as described Electronically Signed   By: Marin Roberts M.D.   On: 01/06/2016 11:57   Mr Laqueta Jean NG Contrast  01/04/2016  CLINICAL DATA:  82 year old female with altered mental status, not responding. Initial encounter. EXAM: MRI HEAD WITHOUT AND WITH CONTRAST TECHNIQUE: Multiplanar, multiecho pulse sequences of the brain and surrounding structures were obtained without and with intravenous contrast. CONTRAST:  16mL MULTIHANCE GADOBENATE DIMEGLUMINE 529 MG/ML IV SOLN COMPARISON:  Head CT without contrast 06/14/2014. Brain MRI, MRA a 03/13/2012. FINDINGS: About half a dozen punctate foci of restricted diffusion are demonstrated scattered in the bilateral hemispheres and superior cerebellum. One of these areas is in the medial right occipital lobe PCA territory where there is suggestion of mild underlying cortical encephalomalacia (series 102, image 19). Left MCA, bilateral PCA, and superior cerebellar artery territories are affected. No acute intracranial hemorrhage identified. No midline shift, mass effect, or evidence of intracranial mass lesion. Major intracranial vascular flow  voids are stable. Minimal to mild generalized cerebral volume loss since 2013. No ventriculomegaly, extra-axial collection or acute intracranial hemorrhage. Cervicomedullary junction and pituitary are within normal limits. Negative visualized cervical spine. Patchy and scattered bilateral cerebral white matter T2 and FLAIR hyperintensity is stable. No abnormal enhancement identified. Visible internal auditory structures appear normal. Mild chronic right mastoid effusion is unchanged. Nasopharynx remains normal. Trace paranasal sinus mucosal thickening. Chronic postoperative changes to both globes. Negative scalp soft tissues. IMPRESSION: 1. 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 Left MCA, bilateral PCA, and SCA territories are affected.  Therefore, consider recent embolic event from the heart or proximal aorta (less likely posterior circulation only thromboembolism). 2. No associated hemorrhage or mass effect. 3. Otherwise no significant change in the MRI appearance of the brain since 2013. Electronically Signed   By: Odessa Fleming M.D.   On: 01/04/2016 09:49   Ct Abdomen Pelvis W Contrast  01/10/2016  CLINICAL DATA:  Acute onset of severe right flank pain. Bacteremia, with septic emboli. Initial encounter. EXAM: CT ABDOMEN AND PELVIS WITH CONTRAST TECHNIQUE: Multidetector CT imaging of the abdomen and pelvis was performed using the standard protocol following bolus administration of intravenous contrast. CONTRAST:  OMNIPAQUE IOHEXOL 300 MG/ML  SOLN COMPARISON:  CT of the abdomen and pelvis from 12/29/2015 FINDINGS: Trace bilateral pleural effusions are noted, with bibasilar atelectasis. There is an acute infarct involving portions of the spleen, new from the prior study. This involves perhaps 30% of the splenic parenchyma. The liver is unremarkable in appearance. The patient is status post cholecystectomy, with clips noted along the gallbladder fossa. The pancreas and adrenal glands are unremarkable. Relatively severe right-sided and mild-to-moderate left-sided hydronephrosis are noted, without evidence of distal obstruction. This may reflect chronic distention of the bladder. Bilateral perinephric stranding and fluid are seen. No renal or ureteral stones are identified. Trace fluid is noted along the paracolic gutters bilaterally. The small bowel is unremarkable in appearance. The stomach is within normal limits. No acute vascular abnormalities are seen. Mild scattered calcification is noted along the abdominal aorta and its branches. The appendix is not definitely characterized; there is no evidence of appendicitis. The colon is unremarkable in appearance. The bladder is significantly distended and grossly unremarkable. The uterus is  unremarkable in appearance. The ovaries are grossly symmetric. No suspicious adnexal masses are seen. No inguinal lymphadenopathy is seen. No acute osseous abnormalities are identified. Vacuum phenomenon and endplate sclerotic change are noted along the mid lumbar spine. IMPRESSION: 1. 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 This involves perhaps 30% of the splenic parenchyma. 2. Relatively severe right-sided and mild-to-moderate left-sided hydronephrosis, without evidence of distal obstruction. This may reflect chronic distention of the bladder. Would correlate clinically for associated findings. 3. Trace fluid along the paracolic gutters bilaterally. 4. Trace bilateral pleural effusions, with bibasilar atelectasis. 5. Mild scattered calcification along the abdominal aorta and its branches. These results were called by telephone at the time of interpretation on 01/10/2016 at 3:31 am to [facility], who verbally acknowledged these results. Electronically Signed   By: Roanna Raider M.D.   On: 01/10/2016 03:33   Ct Abdomen Pelvis W Contrast  12/29/2015  CLINICAL DATA:  Right flank pain since January 25th. Negative renal stone protocol CT earlier today. Additional history of hypertension, hiatal hernia and cholecystectomy. EXAM: CT ABDOMEN AND PELVIS WITH CONTRAST TECHNIQUE: Multidetector CT imaging of the abdomen and pelvis was performed using  the standard protocol following bolus administration of intravenous contrast. CONTRAST:  OMNIPAQUE IOHEXOL 300 MG/ML  SOLN COMPARISON:  Noncontrast abdomen and pelvis CT dated 12/29/2015. Comparison also made to CT abdomen pelvis dated 05/22/2013. FINDINGS: Lower chest: Mild dependent atelectasis at each lung base. Otherwise unremarkable. Hepatobiliary: Liver appears normal. Patient is status post cholecystectomy with associated mild bile duct ectasia. Pancreas: No mass, inflammatory changes, or other significant abnormality.  Spleen: Upper normal in size. Adrenals/Urinary Tract: Adrenal glands are unremarkable. Questionable small stable nodules within each adrenal gland, of benign etiology. Kidneys appear normal without stone or hydronephrosis. No ureteral or bladder calculi identified. Bladder appears normal. Stomach/Bowel: Bowel is normal in caliber. No bowel wall thickening or evidence of bowel wall inflammation. Appendix is normal. Vascular/Lymphatic: Scattered mild atherosclerotic changes of the normal- caliber abdominal aorta. No acute vascular abnormality seen. No enlarged lymph nodes within the abdomen or pelvis. Reproductive: Unremarkable. Small amount of free fluid in the pelvis is likely physiologic in nature. Other: No significant free fluid. No abscess collection. No free intraperitoneal air. Musculoskeletal: Degenerative changes within the lumbar spine, stable in appearance compared to the previous CT of 05/22/2013. No acute- appearing osseous abnormality. 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 Superficial soft tissues are unremarkable. IMPRESSION: 1. No source for right flank pain identified, unless related to the chronic degenerative changes in the lumbar spine. No evidence of acute intra-abdominal or intrapelvic abnormality. 2. Additional incidental/chronic findings detailed above. Electronically Signed   By: Bary Richard M.D.   On: 12/29/2015 17:52   Mr Foot Left Wo Contrast  01/03/2016  CLINICAL DATA:  Bacteremia. Open wounds on the left foot with history of amputation. EXAM: MRI OF THE LEFT FOREFOOT WITHOUT CONTRAST TECHNIQUE: Multiplanar, multisequence MR imaging was performed. No intravenous contrast was administered. COMPARISON:  01/02/2016; 09/15/2014 FINDINGS: The patient refused IV contrast. Despite efforts by the technologist and patient, severe motion artifact is present on today's exam and could not be eliminated. This reduces exam sensitivity  and specificity. First digit amputation at the MTP joint, with a approximately 1.5 by 0.8 by 0.8 cm fluid collection just plantar to the head of the first metatarsal as shown on images 27-32 of series 10, and surrounding abnormal inflammatory effacement of the adipose tissues. Abscess not excluded. Low signal intensity focus on image 33 series 9 possibly a tiny microscopic metallic particle or tiny focus of gas along the amputation margin. As on conventional radiographs, we demonstrate erosive findings in the head of the second and third metatarsals as well as in the bases of the proximal phalanges of the second and third toes. There is some low-level increased T2 signal along the third metatarsal head as on image 27 series 10 Low-level edema tracks along the plantar musculature of the foot. There is some mild subcutaneous edema dorsally in the foot. Degenerative findings along the Lisfranc joint. No Lisfranc joint malalignment identified. IMPRESSION: 1. Erosive findings at the second and third metatarsophalangeal joints are confirmed. This could represent infection, gout, or rheumatoid arthropathy. Particularly in the head of the third metatarsal, osteomyelitis is of greater suspicion. Today's images are severely degraded by motion artifact, and the patient refused IV contrast. 2. Fluid collection plantar to the head of the first metatarsal -abscess not excluded given the inflammatory effacement of the surrounding fatty subcutaneous tissues. Electronically Signed   By: Gaylyn Rong M.D.   On: 01/03/2016 08:41   US Arterial Seg Multiple  12/30/2015  CLINICAL  DATA:  82 year old female with left foot cellulitis and constant bilateral lower extremity leg pain for the last 5 days EXAM: NONINVASIVE PHYSIOLOGIC VASCULAR STUDY OF BILATERAL LOWER EXTREMITIES TECHNIQUE: Evaluation of both lower extremities was performed at rest, including calculation of ankle-brachial indices, multiple segmental pressure evaluation,  segmental Doppler and segmental pulse volume recording. COMPARISON:  None. FINDINGS: Right ABI:  1.2 Left ABI:  1.2 Right Lower Extremity: Normal triphasic arterial waveforms at the ankle. Limited utility of segmental blood pressures secondary to poor compressibility of the vessels. Significantly decreased arterial waveforms in the right second through fifth digits. Left Lower Extremity: Normal triphasic arterial waveforms at the ankle. Limited utility of segmental blood pressures secondary to poor compressibility of the vessels. Decreased amplitude of the arterial waveforms in the second through fifth digits. IMPRESSION: 1. Normal bilateral resting ankle-brachial indices and arterial waveforms at the ankles. No evidence of clinically significant large vessel peripheral arterial disease. 2. Abnormal digital waveforms bilaterally consistent with small vessel disease. Signed, Sterling Big, MD [facility] Electronically Signed   By: Malachy Moan M.D.   On: 12/30/2015 15:38   Dg Chest Port 1 View  01/06/2016  CLINICAL DATA:  Chest pain EXAM: PORTABLE CHEST 1 VIEW COMPARISON:  01/01/2016 FINDINGS: Cardiac shadow is within normal limits. Elevation of the right hemidiaphragm is again noted. Mild rotation to the left is noted accentuating the mediastinal markings. No focal infiltrate is seen. No acute bony abnormality seen. IMPRESSION: No acute abnormality noted. Electronically Signed   By: Alcide Clever M.D.   On: 01/06/2016 09:02   Dg Chest Port 1 View  01/01/2016  CLINICAL DATA:  Shortness of breath, confusion EXAM: PORTABLE CHEST 1 VIEW COMPARISON:  12/29/2015 FINDINGS: Heart is borderline in size. Vascular congestion. Bilateral perihilar and lower lobe opacities are noted, right greater than left. This could represent asymmetric edema or infection. No visible effusions or acute bony abnormality. IMPRESSION: Borderline heart size. Vascular  congestion with bilateral perihilar and lower lobe opacities, right greater than left, asymmetric edema or infection. Electronically Signed   By: Charlett Nose M.D.   On: 01/01/2016 11:45   Dg Foot Complete Left  01/02/2016  CLINICAL DATA:  Open wound of the left foot.  Great toe amputated. EXAM: LEFT FOOT - COMPLETE 3+ VIEW COMPARISON:  09/29/2014 FINDINGS: The great toe has been amputated. The patient has developed what is most likely a chronic dislocation at the second metatarsal phalangeal joint with an erosion of the metatarsal head. There is bone resorption of the distorted distal third metatarsal with deformity consistent with a prior fracture. There is also a new deformity of the distal fourth metatarsal consistent with an old fracture. The bones of the second through fifth toes appear normal. There is no ulceration on the plantar aspect of the ball of the foot. No significant abnormality of the hindfoot or midfoot. IMPRESSION: Chronic changes at the second and third metatarsal phalangeal joints. I doubt that either one of these represents osteomyelitis. Electronically Signed   By: Francene Boyers M.D.   On: 01/02/2016 13:59   Ct Renal Stone Study  12/29/2015  CLINICAL DATA:  Right flank pain starting yesterday. Nausea vomiting. EXAM: CT ABDOMEN AND PELVIS WITHOUT CONTRAST TECHNIQUE: Multidetector CT imaging of the abdomen and pelvis was performed following the standard protocol without IV contrast. COMPARISON:  None. FINDINGS: Lower chest:  Lung bases are clear. Hepatobiliary: Normal liver.  Prior cholecystectomy. Pancreas: Normal. Spleen: Normal. Adrenals/Urinary Tract: Normal adrenal  glands. Normal kidneys. Normal bladder. No urolithiasis or obstructive uropathy. Incidental note is made if a right extrarenal pelvis. Stomach/Bowel: No bowel wall thickening or dilatation. No pneumatosis, pneumoperitoneum or portal venous gas. No abdominal or pelvic free fluid. Vascular/Lymphatic: Normal caliber  abdominal aorta. Mild abdominal aortic atherosclerosis. No lymphadenopathy. Reproductive: Normal uterus.  No adnexal mass. Other: No fluid collection or hematoma. Fat containing right inguinal hernia. Musculoskeletal: No lytic or sclerotic osseous lesion. Degenerative disc disease and facet arthropathy throughout the lumbar spine most severe at L4-5. IMPRESSION: 1. No urolithiasis or obstructive uropathy. Electronically Signed   By: Elige Ko   On: 12/29/2015 08:57    Microbiology: Recent Results (from the past 240 hour(s))  Wound culture     Status: None   Collection Time: 01/03/16  6:43 PM  Result Value Ref Range Status   Specimen Description WOUND LEFT FOOT  Final   Special Requests NONE  Final   Gram Stain   Final    NO WBC SEEN NO SQUAMOUS EPITHELIAL CELLS SEEN NO ORGANISMS SEEN Performed at [facility]    Culture   Final    MODERATE STAPHYLOCOCCUS AUREUS Note: RIFAMPIN AND GENTAMICIN SHOULD NOT BE USED AS SINGLE DRUGS FOR TREATMENT OF STAPH INFECTIONS. Performed at [facility]    Report Status 01/06/2016 FINAL  Final   Organism ID, Bacteria STAPHYLOCOCCUS AUREUS  Final      Susceptibility   Staphylococcus aureus - MIC*    CLINDAMYCIN <=0.25 SENSITIVE Sensitive     ERYTHROMYCIN <=0.25 SENSITIVE Sensitive     GENTAMICIN <=0.5 SENSITIVE Sensitive     LEVOFLOXACIN <=0.12 SENSITIVE Sensitive     OXACILLIN <=0.25 SENSITIVE Sensitive     RIFAMPIN <=0.5 SENSITIVE Sensitive     TRIMETH/SULFA <=10 SENSITIVE Sensitive     VANCOMYCIN 1 SENSITIVE Sensitive     TETRACYCLINE <=1 SENSITIVE Sensitive     MOXIFLOXACIN <=0.25 SENSITIVE Sensitive     * MODERATE STAPHYLOCOCCUS AUREUS  Culture, blood (routine x 2)     Status: None   Collection Time: 01/05/16  8:06 PM  Result Value Ref Range Status   Specimen Description BLOOD RIGHT ARM  Final   Special Requests IN PEDIATRIC BOTTLE 4CC  Final   Culture  Setup Time   Final    GRAM POSITIVE COCCI IN CLUSTERS AEROBIC  BOTTLE ONLY CRITICAL RESULT CALLED TO, READ BACK BY AND VERIFIED WITH: J EDWARDS RN [id number] 01/06/16 A BROWNING    Culture STAPHYLOCOCCUS AUREUS  Final   Report Status 01/08/2016 FINAL  Final   Organism ID, Bacteria STAPHYLOCOCCUS AUREUS  Final      Susceptibility   Staphylococcus aureus - MIC*    CIPROFLOXACIN <=0.5 SENSITIVE Sensitive     ERYTHROMYCIN <=0.25 SENSITIVE Sensitive     GENTAMICIN <=0.5 SENSITIVE Sensitive     OXACILLIN 0.5 SENSITIVE Sensitive     TETRACYCLINE <=1 SENSITIVE Sensitive     VANCOMYCIN <=0.5 SENSITIVE Sensitive     TRIMETH/SULFA <=10 SENSITIVE Sensitive     CLINDAMYCIN <=0.25 SENSITIVE Sensitive     RIFAMPIN <=0.5 SENSITIVE Sensitive     Inducible Clindamycin NEGATIVE Sensitive     * STAPHYLOCOCCUS AUREUS  Culture, blood (routine x 2)     Status: None   Collection Time: 01/05/16  8:08 PM  Result Value Ref Range Status   Specimen Description BLOOD RIGHT HAND  Final   Special Requests BOTTLES DRAWN AEROBIC ONLY 9CC  Final   Culture NO GROWTH 5 DAYS  Final  Report Status 01/10/2016 FINAL  Final  Culture, blood (routine x 2)     Status: None   Collection Time: 01/06/16  4:55 PM  Result Value Ref Range Status   Specimen Description BLOOD LEFT ANTECUBITAL  Final   Special Requests BOTTLES DRAWN AEROBIC AND ANAEROBIC 10CC  Final   Culture NO GROWTH 5 DAYS  Final   Report Status 01/11/2016 FINAL  Final  Culture, blood (routine x 2)     Status: None   Collection Time: 01/06/16  4:59 PM  Result Value Ref Range Status   Specimen Description BLOOD BLOOD LEFT HAND  Final   Special Requests BOTTLES DRAWN AEROBIC AND ANAEROBIC 10CC  Final   Culture NO GROWTH 5 DAYS  Final   Report Status 01/11/2016 FINAL  Final  Surgical PCR screen     Status: Abnormal   Collection Time: 01/08/16  2:44 PM  Result Value Ref Range Status   MRSA, PCR NEGATIVE NEGATIVE Final   Staphylococcus aureus POSITIVE (A) NEGATIVE Final    Comment:        The Xpert SA Assay (FDA approved for  NASAL specimens in patients over 56 years of age), is one component of a comprehensive surveillance program.  Test performance has been validated by [facility] for patients greater than or equal to 27 year old. It is not intended to diagnose infection nor to guide or monitor treatment.   C difficile quick scan w PCR reflex     Status: None   Collection Time: 01/08/16  3:45 PM  Result Value Ref Range Status   C Diff antigen NEGATIVE NEGATIVE Final   C Diff toxin NEGATIVE NEGATIVE Final   C Diff interpretation Negative for toxigenic C. difficile  Final     Labs: Basic Metabolic Panel:  Recent Labs Lab 01/09/16 0520 01/10/16 0553 01/11/16 0517 01/12/16 0711 01/13/16 0610  NA 139 135 135 133* 134*  K 3.2* 3.7 3.1* 3.8 3.3*  CL 106 102 99* 100* 99*  CO2 24 23 28 25 27   GLUCOSE 151* 106* 99 88 118*  BUN 22* 14 10 10 9   CREATININE 0.98 0.85 0.84 0.77 0.86  CALCIUM 7.9* 7.6* 7.8* 7.6* 7.9*  MG  --   --   --  2.1  --    CBC:  Recent Labs Lab 01/08/16 0540 01/09/16 0520 01/10/16 0553 01/11/16 0517 01/13/16 0610  WBC 19.7* 25.1* 17.8* 17.1* 11.8*  HGB 10.1* 9.5* 7.6* 11.3* 10.1*  HCT 29.9* 28.7* 23.5* 33.0* 31.9*  MCV 91.4 92.0 91.1 89.9 90.4  PLT 330 375 355 398 463*   Cardiac Enzymes:  Recent Labs Lab 01/06/16 2012  TROPONINI 0.10*   BNP: BNP (last 3 results)  Recent Labs  01/01/16 1142  BNP 432.0*   CBG:  Recent Labs Lab 01/12/16 1208 01/12/16 1639  GLUCAP 128* 125*    Signed:  Vassie Loll MD.  [facility] 01/13/2016, 3:12 PM

## 2016-01-13 NOTE — Progress Notes (Signed)
Patient now wanting to go to SNF. CSW will continue with discharge needs.  Osborne Casco Donat Humble LCSWA [phone]

## 2016-01-13 NOTE — Progress Notes (Signed)
Patient will DC to: [facility] Anticipated DC date: 01/13/16 Family notified: Media planner by: PTAR  CSW signing off.  Cristobal Goldmann, [place] Clinical Social Worker [phone]

## 2016-01-13 NOTE — Progress Notes (Signed)
Occupational Therapy Treatment Patient Details Name: Cassandra [name] MRN: [id number] DOB: March 02, 1933 Today's Date: 01/13/2016    History of present illness 82yo white female who presents to [facility] on 12/29/15 after 2 days of N/V, R flank pain, and generalized weakness. Pt admitted for Gastroenteritis after workup. At baseline, pt reports she is a fully indep community ambulator without AD. PMH: HTN, HLD, and L hallux amputation 1ya. During past few days patient has been increasingly lethargic and somnolent. MRI findings triggered podiatry consult on 1/31, followed by debridement and new NWB status on LLE. MRI also performed secondary to acute encephlaopathy/AMS and revealed multiple acattered punctuate acute lunar infarcts. Pt was transferred from [facility] on 01/04/16.  Pt then underwent a L transmetatarsal amputation on the L and L percutaneous achilles tendon lengthening.    OT comments  Patient making slow progress towards OT goals, will continue plan of care for now. Recommending CIR vs SNF, unsure if pt will be able to tolerate 3 hours of therapy per day, will let team decide. Co-treatment with PT this session, using slide board to assist pt with EOB to recliner transfer. Pt also performed sit to/from stand using bed rails to aide in standing. Pt with very poor adherence to NWB to LLE, even after verbal and tactile cueing. Pt unsafe to go home from this standpoint and will need post acute rehab.    Follow Up Recommendations  CIR;Supervision/Assistance - 24 hour (VS SNF)    Equipment Recommendations  Other (comment) (TBD next venue of care)    Recommendations for Other Services Rehab consult    Precautions / Restrictions Precautions Precautions: Fall Precaution Comments: L foot infection now s/p L transmetatarsal amputation with achilles lengthening. / NWB Restrictions Weight Bearing Restrictions: Yes LLE Weight Bearing: Non weight bearing    Mobility Bed Mobility Overal bed  mobility: Needs Assistance Bed Mobility: Rolling;Supine to Sit;Sit to Supine Rolling: Min guard Sidelying to sit: Mod assist;HOB elevated   Sit to supine: Mod assist   General bed mobility comments: Pt demonstrated improved ability to roll to R side with min guard assist.  Pt able to advance B LEs to edge of bed unassisted requiring assist mod +1 for trunk control to achieve sitting edge of bed.  Pt performed weight shifting to R and L for perianal care and positioning of sliding board.    Transfers Overall transfer level: Needs assistance Equipment used:  (sliding board) Transfers: Lateral/Scoot Transfers;Sit to/from Stand (slide board for scoot transfer to recliner) Sit to Stand: Max assist;+2 physical assistance;+2 safety/equipment (sit to/from stand from recliner using bed rail to assist up, pt unable to maintain NWB)        Lateral/Scoot Transfers: With slide board;Mod assist;+2 physical assistance (max VC and mod +2 physical assist.) General transfer comment: Pt performed sliding board transfer with success and decreased assist.  Pt required total assist for set-up and board placement.  Pt performed sit to stand from recliner with grab bar in front to improve standing tolerance.  Pt required max assist +1 but able to achieve more upright stance still lacking full extension of B hips.  Pt performed  scooting in chair with +2 mod assist and use of chux pad.      Balance Overall balance assessment: Needs assistance Sitting-balance support: Single extremity supported;Bilateral upper extremity supported Sitting balance-Leahy Scale: Fair Sitting balance - Comments: pt able to sit on EOB without physical assist but no challenges given.     Standing balance-Leahy Scale: Zero  Standing balance comment: poor adherence to NWB in standing, unable to maintain balance in standing position   ADL Overall ADL's : Needs assistance/impaired Eating/Feeding: Set up;Sitting   Grooming: Set  up;Sitting   Upper Body Bathing: Minimal assitance;Sitting   Lower Body Bathing: Maximal assistance;Bed level;Cueing for sequencing;Cueing for safety   Upper Body Dressing : Minimal assistance;Sitting   Lower Body Dressing: Total assistance;Bed level;Cueing for sequencing;Cueing for safety   Toilet Transfer: Moderate assistance;Cueing for safety;+2 for physical assistance;+2 for safety/equipment;Requires drop arm;Transfer board Toilet Transfer Details (indicate cue type and reason): Pt performed slide board transfer EOB to recliner, simulated like drop arm BSC Toileting- Clothing Manipulation and Hygiene: Total assistance;+2 for physical assistance;+2 for safety/equipment;Bed level Toileting - Clothing Manipulation Details (indicate cue type and reason): Seated EOB, pt with bowel incontinence. Pt required total assist for clothing management and peri cleansing   Tub/Shower Transfer Details (indicate cue type and reason): not appropriate at this time   General ADL Comments: Max encouragement needed to participate in ADL, pt states "I can't do it, my foot is swollen". Pt will benefit from AE to increase independence with LB ADLs. Pt self-limiting. Slide board transfer was very successful from EOB to drop arm recliner. Pt performed sit to/from stand up from recliner using bed rails to assist. Pt with difficulty maintaining NWB to LLE.      Cognition   Behavior During Therapy: WFL for tasks assessed/performed Overall Cognitive Status: Impaired/Different from baseline Area of Impairment: Attention;Safety/judgement   Current Attention Level: Sustained Memory:  (pt able to verbalize NWB, but unable to maintain during transfers)  Following Commands: Follows one step commands consistently Safety/Judgement: Decreased awareness of safety;Decreased awareness of deficits   Problem Solving: Requires verbal cues;Requires tactile cues General Comments: Increased time needed, pt self-limiting                  Pertinent Vitals/ Pain       Pain Assessment: Faces Faces Pain Scale: Hurts little more Pain Location: back Pain Descriptors / Indicators: Grimacing;Guarding Pain Intervention(s): Limited activity within patient's tolerance;Premedicated before session;Repositioned   Frequency Min 2X/week     Progress Toward Goals  OT Goals(current goals can now befound in the care plan section)  Progress towards OT goals: Progressing toward goals  Acute Rehab OT Goals Patient Stated Goal: sit up in chair to eat breakfast.  OT Goal Formulation: With patient Time For Goal Achievement: 01/19/16 Potential to Achieve Goals: Fair  Plan Discharge plan remains appropriate    Co-evaluation    PT/OT/SLP Co-Evaluation/Treatment: Yes Reason for Co-Treatment: For patient/therapist safety;Complexity of the patient's impairments (multi-system involvement) PT goals addressed during session: Mobility/safety with mobility OT goals addressed during session: Other (comment);ADL's and self-care (functional transfers)      End of Session Equipment Utilized During Treatment: Other (comment) (slide board)   Activity Tolerance Patient tolerated treatment well   Patient Left in chair;with call bell/phone within reach;Other (comment) (lift pad placed under her)   Nurse Communication Mobility status     Time: 4098-1191 OT Time Calculation (min): 24 min  Charges: OT General Charges $OT Visit: 1 Procedure OT Treatments $Therapeutic Activity: 8-22 mins  Edwin Cap , MS, OTR/L, CLT Pager: [phone]  01/13/2016, 10:20 AM

## 2016-01-13 NOTE — Progress Notes (Signed)
Discussed plan of care with granddaughter.  Patients POA and family have decided that they do not think patient will be safe going home with HH at this time because of her extreme weakness.  Patient still having difficulty getting from the bed to chair, having to use lift equipment at this time.  Family would like to try again for IP Rehab if possible.  Will notify CM and SW of this change

## 2016-01-13 NOTE — Progress Notes (Signed)
Per nurse and CSW, patient and family have now chosen to precede with SNF

## 2016-01-13 NOTE — Progress Notes (Signed)
Peripherally Inserted Central Catheter/Midline Placement  The IV Nurse has discussed with the patient and/or persons authorized to consent for the patient, the purpose of this procedure and the potential benefits and risks involved with this procedure.  The benefits include less needle sticks, lab draws from the catheter and patient may be discharged home with the catheter.  Risks include, but not limited to, infection, bleeding, blood clot (thrombus formation), and puncture of an artery; nerve damage and irregular heat beat.  Alternatives to this procedure were also discussed.  PICC/Midline Placement Documentation  PICC Single Lumen 01/13/16 PICC Right Basilic 40 cm 1 cm (Active)  Indication for Insertion or Continuance of Line Home intravenous therapies (PICC only) 01/13/2016 12:00 PM  Exposed Catheter (cm) 1 cm 01/13/2016 12:00 PM  Dressing Change Due 01/20/16 01/13/2016 12:00 PM   Telephone consent    Stacie Glaze Horton 01/13/2016, 12:02 PM

## 2016-01-13 NOTE — Progress Notes (Signed)
Physical Therapy Treatment Patient Details Name: Cassandra [name] MRN: [id number] DOB: 07/01/1933 Today's Date: 01/13/2016    History of Present Illness 82yo white female who presents to [facility] on 12/29/15 after 2 days of N/V, R flank pain, and generalized weakness. Pt admitted for Gastroenteritis after workup. At baseline, pt reports she is a fully indep community ambulator without AD. PMH: HTN, HLD, and L hallux amputation 1ya. During past few days patient has been increasingly lethargic and somnolent. MRI findings triggered podiatry consult on 1/31, followed by debridement and new NWB status on LLE. MRI also performed secondary to acute encephlaopathy/AMS and revealed multiple acattered punctuate acute lunar infarcts. Pt was transferred from [facility] on 01/04/16.  Pt then underwent a L transmetatarsal amputation on the L and L percutaneous achilles tendon lengthening.     PT Comments    Pt performed sliding board transfer with decreased assist.  Will inform supervising PT to update goals.  Pt performed sit to stand with grab bar and achieved more upright stance but remains to require +2 max assist.  CO-TX performed to improve safety during tx.  Pt tolerated increased activity well.    Follow Up Recommendations  Other (comment) (Post acute rehab setting, pt unsafe for d/c at home.)     Equipment Recommendations  None recommended by PT    Recommendations for Other Services Rehab consult     Precautions / Restrictions Precautions Precautions: Fall Precaution Comments: L foot infection now s/p L transmetatarsal amputation with achilles lengthening. / NWB Restrictions Weight Bearing Restrictions: Yes LLE Weight Bearing: Non weight bearing (able to maintain during sliding board transfer.  Unable to maintain during sit to stand transfer.  )    Mobility  Bed Mobility Overal bed mobility: Needs Assistance Bed Mobility: Rolling;Supine to Sit;Sit to Supine Rolling: Min guard Sidelying  to sit: Mod assist;HOB elevated   Sit to supine: Mod assist   General bed mobility comments: Pt demonstrated improved ability to roll to R side with min guard assist.  Pt able to advance B LEs to edge of bed unassisted requiring assist mod +1 for trunk control to achieve sitting edge of bed.  Pt performed weight shifting to R and L for perianal care and positioning of sliding board.    Transfers Overall transfer level: Needs assistance Equipment used:  (sliding board) Transfers: Lateral/Scoot Transfers;Sit to/from Stand (sliding board transfer) Sit to Stand: Max assist;+2 physical assistance;+2 safety/equipment (Pt performed sit to stand from recliner chair with grab bar in front in elevated position to allow B UE assistant from pt.  )        Lateral/Scoot Transfers: With slide board;Mod assist;+2 physical assistance (demonstrated technqiue with max verbal cues and mod +2 assist.  Pt required decreased assist with this technique.  ) General transfer comment: Pt performed sliding board transfer with success and decreased assist.  Pt required total assist for set-up and board placement.  Pt performed sit to stand from recliner with grab bar in front to improve standing tolerance.  Pt required max assist +1 but able to achieve more upright stance still lacking full extension of B hips.  Pt performed  scooting in chair with +2 mod assist and use of chux pad.    Ambulation/Gait Ambulation/Gait assistance:  (pt remains unable.  will inform supervising PT of need for WC mobility.  )               Stairs  Wheelchair Mobility    Modified Rankin (Stroke Patients Only)       Balance     Sitting balance-Leahy Scale: Fair Sitting balance - Comments: pt able to sit on EOB without physical assist but no challenges given.     Standing balance-Leahy Scale: Zero                      Cognition Arousal/Alertness: Awake/alert Behavior During Therapy: WFL for tasks  assessed/performed Overall Cognitive Status: Impaired/Different from baseline Area of Impairment: Attention;Safety/judgement   Current Attention Level: Sustained Memory:  (Pt able to recall NWB precautions but unable to maintain during sit to stand transfers.  ) Following Commands: Follows one step commands consistently Safety/Judgement: Decreased awareness of safety;Decreased awareness of deficits   Problem Solving: Requires verbal cues;Requires tactile cues      Exercises      General Comments        Pertinent Vitals/Pain Pain Assessment: Faces Faces Pain Scale: Hurts little more Pain Location: back Pain Descriptors / Indicators: Grimacing;Guarding Pain Intervention(s): Limited activity within patient's tolerance;Premedicated before session;Repositioned    Home Living                      Prior Function            PT Goals (current goals can now be found in the care plan section) Acute Rehab PT Goals Patient Stated Goal: sit up in chair to eat breakfast.  Potential to Achieve Goals: Good Progress towards PT goals: Not progressing toward goals - comment (Pt remains unable to ambulate therefore gait training and stair training not adressed as intervention remains inappropriate.  Pt would benefit from sliding board transfer goal and WC mobility goal to improve activity tolerance.  )    Frequency  Min 3X/week    PT Plan Current plan remains appropriate    Co-evaluation PT/OT/SLP Co-Evaluation/Treatment: Yes Reason for Co-Treatment: For patient/therapist safety;Complexity of the patient's impairments (multi-system involvement) PT goals addressed during session: Mobility/safety with mobility       End of Session Equipment Utilized During Treatment: Gait belt Activity Tolerance: Patient tolerated treatment well Patient left: with call bell/phone within reach;in chair;with family/visitor present     Time: 2956-2130 PT Time Calculation (min) (ACUTE ONLY):  25 min  Charges:  $Therapeutic Activity: 8-22 mins                    G Codes:      Florestine Avers 2016/01/29, 9:39 AM Joycelyn Rua, PTA pager [phone]

## 2016-01-13 NOTE — Progress Notes (Signed)
Called report to nurse at [facility].

## 2016-01-16 DIAGNOSIS — I10 Essential (primary) hypertension: Secondary | ICD-10-CM | POA: Diagnosis not present

## 2016-01-16 DIAGNOSIS — A419 Sepsis, unspecified organism: Secondary | ICD-10-CM | POA: Diagnosis not present

## 2016-01-17 DIAGNOSIS — A419 Sepsis, unspecified organism: Secondary | ICD-10-CM | POA: Diagnosis not present

## 2016-01-17 DIAGNOSIS — E785 Hyperlipidemia, unspecified: Secondary | ICD-10-CM | POA: Diagnosis not present

## 2016-01-17 DIAGNOSIS — I10 Essential (primary) hypertension: Secondary | ICD-10-CM | POA: Diagnosis not present

## 2016-01-17 DIAGNOSIS — I2690 Septic pulmonary embolism without acute cor pulmonale: Secondary | ICD-10-CM | POA: Diagnosis not present

## 2016-01-20 DIAGNOSIS — F329 Major depressive disorder, single episode, unspecified: Secondary | ICD-10-CM | POA: Diagnosis not present

## 2016-01-20 DIAGNOSIS — Z89439 Acquired absence of unspecified foot: Secondary | ICD-10-CM | POA: Diagnosis not present

## 2016-01-20 DIAGNOSIS — I10 Essential (primary) hypertension: Secondary | ICD-10-CM | POA: Diagnosis not present

## 2016-01-20 DIAGNOSIS — M869 Osteomyelitis, unspecified: Secondary | ICD-10-CM | POA: Diagnosis not present

## 2016-01-20 DIAGNOSIS — R339 Retention of urine, unspecified: Secondary | ICD-10-CM | POA: Diagnosis not present

## 2016-01-20 DIAGNOSIS — I639 Cerebral infarction, unspecified: Secondary | ICD-10-CM | POA: Diagnosis not present

## 2016-01-20 DIAGNOSIS — A419 Sepsis, unspecified organism: Secondary | ICD-10-CM | POA: Diagnosis not present

## 2016-01-23 DIAGNOSIS — I2690 Septic pulmonary embolism without acute cor pulmonale: Secondary | ICD-10-CM | POA: Diagnosis not present

## 2016-01-23 DIAGNOSIS — Z4789 Encounter for other orthopedic aftercare: Secondary | ICD-10-CM | POA: Diagnosis not present

## 2016-01-23 DIAGNOSIS — A419 Sepsis, unspecified organism: Secondary | ICD-10-CM | POA: Diagnosis not present

## 2016-01-23 DIAGNOSIS — R339 Retention of urine, unspecified: Secondary | ICD-10-CM | POA: Diagnosis not present

## 2016-01-23 DIAGNOSIS — D735 Infarction of spleen: Secondary | ICD-10-CM | POA: Diagnosis not present

## 2016-01-25 DIAGNOSIS — I10 Essential (primary) hypertension: Secondary | ICD-10-CM | POA: Diagnosis not present

## 2016-01-25 DIAGNOSIS — R339 Retention of urine, unspecified: Secondary | ICD-10-CM | POA: Diagnosis not present

## 2016-01-25 DIAGNOSIS — I2690 Septic pulmonary embolism without acute cor pulmonale: Secondary | ICD-10-CM | POA: Diagnosis not present

## 2016-01-25 DIAGNOSIS — D735 Infarction of spleen: Secondary | ICD-10-CM | POA: Diagnosis not present

## 2016-01-26 DIAGNOSIS — R338 Other retention of urine: Secondary | ICD-10-CM | POA: Diagnosis not present

## 2016-01-26 DIAGNOSIS — N1330 Unspecified hydronephrosis: Secondary | ICD-10-CM | POA: Diagnosis not present

## 2016-01-27 DIAGNOSIS — I10 Essential (primary) hypertension: Secondary | ICD-10-CM | POA: Diagnosis not present

## 2016-01-27 DIAGNOSIS — D735 Infarction of spleen: Secondary | ICD-10-CM | POA: Diagnosis not present

## 2016-01-27 DIAGNOSIS — R339 Retention of urine, unspecified: Secondary | ICD-10-CM | POA: Diagnosis not present

## 2016-01-27 DIAGNOSIS — I2690 Septic pulmonary embolism without acute cor pulmonale: Secondary | ICD-10-CM | POA: Diagnosis not present

## 2016-01-30 DIAGNOSIS — R339 Retention of urine, unspecified: Secondary | ICD-10-CM | POA: Diagnosis not present

## 2016-01-30 DIAGNOSIS — I10 Essential (primary) hypertension: Secondary | ICD-10-CM | POA: Diagnosis not present

## 2016-01-30 DIAGNOSIS — I2690 Septic pulmonary embolism without acute cor pulmonale: Secondary | ICD-10-CM | POA: Diagnosis not present

## 2016-01-30 DIAGNOSIS — D735 Infarction of spleen: Secondary | ICD-10-CM | POA: Diagnosis not present

## 2016-02-01 DIAGNOSIS — N3020 Other chronic cystitis without hematuria: Secondary | ICD-10-CM | POA: Diagnosis not present

## 2016-02-01 DIAGNOSIS — D735 Infarction of spleen: Secondary | ICD-10-CM | POA: Diagnosis not present

## 2016-02-01 DIAGNOSIS — A419 Sepsis, unspecified organism: Secondary | ICD-10-CM | POA: Diagnosis not present

## 2016-02-01 DIAGNOSIS — I2690 Septic pulmonary embolism without acute cor pulmonale: Secondary | ICD-10-CM | POA: Diagnosis not present

## 2016-02-01 DIAGNOSIS — R338 Other retention of urine: Secondary | ICD-10-CM | POA: Diagnosis not present

## 2016-02-01 DIAGNOSIS — R339 Retention of urine, unspecified: Secondary | ICD-10-CM | POA: Diagnosis not present

## 2016-02-01 DIAGNOSIS — R310 Gross hematuria: Secondary | ICD-10-CM | POA: Diagnosis not present

## 2016-02-08 ENCOUNTER — Ambulatory Visit (INDEPENDENT_AMBULATORY_CARE_PROVIDER_SITE_OTHER): Payer: Medicare Other | Admitting: Internal Medicine

## 2016-02-08 ENCOUNTER — Encounter: Payer: Self-pay | Admitting: Internal Medicine

## 2016-02-08 VITALS — BP 148/79 | HR 81 | Temp 97.8°F

## 2016-02-08 DIAGNOSIS — R0989 Other specified symptoms and signs involving the circulatory and respiratory systems: Secondary | ICD-10-CM | POA: Diagnosis not present

## 2016-02-08 DIAGNOSIS — I10 Essential (primary) hypertension: Secondary | ICD-10-CM | POA: Diagnosis not present

## 2016-02-08 DIAGNOSIS — Z89432 Acquired absence of left foot: Secondary | ICD-10-CM

## 2016-02-08 DIAGNOSIS — D735 Infarction of spleen: Secondary | ICD-10-CM | POA: Diagnosis not present

## 2016-02-08 DIAGNOSIS — I330 Acute and subacute infective endocarditis: Secondary | ICD-10-CM | POA: Diagnosis not present

## 2016-02-08 DIAGNOSIS — B9561 Methicillin susceptible Staphylococcus aureus infection as the cause of diseases classified elsewhere: Secondary | ICD-10-CM

## 2016-02-08 DIAGNOSIS — I2690 Septic pulmonary embolism without acute cor pulmonale: Secondary | ICD-10-CM | POA: Diagnosis not present

## 2016-02-08 DIAGNOSIS — R339 Retention of urine, unspecified: Secondary | ICD-10-CM | POA: Diagnosis not present

## 2016-02-08 DIAGNOSIS — R7881 Bacteremia: Secondary | ICD-10-CM

## 2016-02-08 LAB — BASIC METABOLIC PANEL
BUN: 7 mg/dL (ref 7–25)
CALCIUM: 8.1 mg/dL — AB (ref 8.6–10.4)
CO2: 26 mmol/L (ref 20–31)
Chloride: 99 mmol/L (ref 98–110)
Creat: 0.7 mg/dL (ref 0.60–0.88)
Glucose, Bld: 104 mg/dL — ABNORMAL HIGH (ref 65–99)
Potassium: 3.8 mmol/L (ref 3.5–5.3)
SODIUM: 133 mmol/L — AB (ref 135–146)

## 2016-02-08 LAB — C-REACTIVE PROTEIN: CRP: 12.1 mg/dL — AB (ref <0.60)

## 2016-02-08 NOTE — Progress Notes (Signed)
RFV: follow up on complicated MSSA bacteremia with endocarditis/CNS emboli and diabetic foot ulcer s/p TM amputation Subjective:    Patient ID: Cassandra [name], female    DOB: 03/10/1933, 82 y.o.   MRN: [id number]  HPI Cassandra [name] is a 82yo F who was recently hospitalized for MSSA bacteremia that was complicated by CNS and splenic infarct, found to have endocarditis which recommended medical management. The source of bacteremia was thought to be due to right diabetic foot ulcer/osteo for which she underwent TM amputation. She was discharged on 8 wk of nafcillin for which she has been taking without difficulty. She continues to improve with physical therapy. During her hospitalization she ahd complained of flank pain with CT finding to have hydronephrosis thus she has been also seeing urology. She still has foley in place.  Allergies  Allergen Reactions  . Codeine Shortness Of Breath and Nausea And Vomiting  . Hydrocodone Shortness Of Breath and Nausea And Vomiting   No current facility-administered medications on file prior to visit.   Current Outpatient Prescriptions on File Prior to Visit  Medication Sig Dispense Refill  . aspirin 81 MG chewable tablet Chew 81 mg by mouth daily.    [name]. atenolol (TENORMIN) 100 MG tablet Take 1 tablet by mouth daily.    . Biotin 5000 MCG CAPS Take 10,000 mcg by mouth daily.     . Calcium Carbonate-Vitamin D (CALCIUM + D) 600-200 MG-UNIT TABS Take 1 tablet by mouth daily.    . carboxymethylcellulose (REFRESH PLUS) 0.5 % SOLN Place 1 drop into both eyes 3 (three) times daily as needed (Dry Eyes).    . Cyanocobalamin (VITAMIN B-12 IJ) Inject as directed every 30 (thirty) days.    . feeding supplement, ENSURE ENLIVE, (ENSURE ENLIVE) LIQD Take 237 mLs by mouth 2 (two) times daily between meals. 237 mL 12  . isosorbide mononitrate (IMDUR) 30 MG 24 hr tablet Take 1 tablet (30 mg total) by mouth daily.    [name]. latanoprost (XALATAN) 0.005 % ophthalmic solution Place 1 drop  into both eyes at bedtime.    [name]. losartan (COZAAR) 100 MG tablet Take 100 mg by mouth daily.  11  . Multiple Vitamins-Minerals (PRESERVISION AREDS PO) Take 1 tablet by mouth 2 (two) times daily.    . nafcillin 2 g in dextrose 5 % 100 mL Inject 2 g into the vein every 4 (four) hours.    . ondansetron (ZOFRAN ODT) 4 MG disintegrating tablet 4mg  ODT q4 hours prn nausea/vomit 4 tablet 0  . pantoprazole (PROTONIX) 40 MG tablet Take 1 tablet (40 mg total) by mouth 2 (two) times daily.    . ranitidine (ZANTAC) 300 MG tablet Take 1 tablet (300 mg total) by mouth at bedtime.    . sertraline (ZOLOFT) 100 MG tablet Take 100 mg by mouth daily.     [name]. SIMBRINZA 1-0.2 % SUSP Place 1 drop into both eyes 3 (three) times daily.    . simvastatin (ZOCOR) 10 MG tablet Take 10 mg by mouth at bedtime.    . traMADol (ULTRAM) 50 MG tablet Take 1 tablet (50 mg total) by mouth every 6 (six) hours as needed for severe pain. Take one or two tablets by mouth every 6 hours as needed for pain (Patient taking differently: Take 50 mg by mouth 3 (three) times daily. ) 20 tablet 0   Active Ambulatory Problems    Diagnosis Date Noted  . Hypertension   . Hyperlipidemia   . PFO (patent foramen ovale)   .  H/O echocardiogram   . Heart palpitations   . Septic arthritis of interphalangeal joint of toe of left foot (HCC) 09/16/2014  . Cellulitis and abscess of toe of left foot 09/16/2014  . Osteomyelitis of toe of left foot (HCC) 09/16/2014  . Normocytic anemia 09/17/2014  . Gastroenteritis 12/29/2015  . Glaucoma 12/29/2015  . Staphylococcus aureus bacteremia 01/01/2016  . Diminished pulses in lower extremity   . Emesis   . Bacteremia   . Sepsis (HCC) 01/05/2016  . Cerebral embolism with cerebral infarction 01/06/2016  . Bacterial endocarditis   . Chronic osteomyelitis of left foot (HCC)   . Chest tightness 01/08/2016  . Chest pain   . Shortness of breath   . Loose stools   . Altered mental status   . Open wound of left  foot   . S/P amputation of foot (HCC)   . GERD (gastroesophageal reflux disease) 02/09/2016  . Acute pulmonary embolism (HCC) 02/10/2016  . PE (pulmonary embolism) 02/10/2016   Resolved Ambulatory Problems    Diagnosis Date Noted  . Cellulitis 07/18/2014  . Ulcer of right foot (HCC) 07/18/2014  . UTI (urinary tract infection) 07/18/2014   Past Medical History  Diagnosis Date  . Arthritis   . Depression   . H/O cardiovascular stress test 10/13/2007  . Hiatal hernia     Review of Systems Denies fever, chills, nightsweats. No drainage from foot    Objective:   Physical Exam BP 148/79 mmHg  Pulse 81  Temp(Src) 97.8 F (36.6 C) (Oral) gen = a x o by 3 in NAD in wheelchair. Good recall of facts HEENT = no signs of thrush Cors= nls1,s2, 3/6 SEM Abd= ntnd, bs+, foley in place Skin = left foot healing incision. No warmth or drainage. picc line is c/d/i Ext= trace edema        Assessment & Plan:  MSSA complicated bacteremia with endocarditis- sees dr. Allyson Sabal as her cardiologist, will reach out to him to see if he can repeat TEE at beginning of April - continue abtx til march 20th - will see her back in mid April - will check sed rate, crp, cbc, and bmp  Foley dependence? = she reports that they did a trial without foley but she had retention. She sees urology later in the week for further eval  Hypertension = she states that she receives some of her BP meds in the afternoon, which could account for elevated BP.

## 2016-02-09 ENCOUNTER — Emergency Department (HOSPITAL_COMMUNITY): Payer: Medicare Other

## 2016-02-09 ENCOUNTER — Encounter (HOSPITAL_COMMUNITY): Payer: Self-pay

## 2016-02-09 ENCOUNTER — Inpatient Hospital Stay (HOSPITAL_COMMUNITY)
Admission: EM | Admit: 2016-02-09 | Discharge: 2016-02-16 | DRG: 175 | Disposition: A | Discharge status: Skilled Nursing Facility | Payer: Medicare Other | Attending: Family Medicine | Admitting: Family Medicine

## 2016-02-09 DIAGNOSIS — M4626 Osteomyelitis of vertebra, lumbar region: Secondary | ICD-10-CM | POA: Diagnosis present

## 2016-02-09 DIAGNOSIS — I2690 Septic pulmonary embolism without acute cor pulmonale: Secondary | ICD-10-CM | POA: Diagnosis not present

## 2016-02-09 DIAGNOSIS — N200 Calculus of kidney: Secondary | ICD-10-CM | POA: Diagnosis not present

## 2016-02-09 DIAGNOSIS — Q211 Atrial septal defect: Secondary | ICD-10-CM | POA: Diagnosis not present

## 2016-02-09 DIAGNOSIS — I330 Acute and subacute infective endocarditis: Secondary | ICD-10-CM | POA: Diagnosis present

## 2016-02-09 DIAGNOSIS — I63419 Cerebral infarction due to embolism of unspecified middle cerebral artery: Secondary | ICD-10-CM

## 2016-02-09 DIAGNOSIS — Z960 Presence of urogenital implants: Secondary | ICD-10-CM | POA: Diagnosis not present

## 2016-02-09 DIAGNOSIS — B9620 Unspecified Escherichia coli [E. coli] as the cause of diseases classified elsewhere: Secondary | ICD-10-CM | POA: Diagnosis not present

## 2016-02-09 DIAGNOSIS — G061 Intraspinal abscess and granuloma: Secondary | ICD-10-CM | POA: Diagnosis present

## 2016-02-09 DIAGNOSIS — N390 Urinary tract infection, site not specified: Secondary | ICD-10-CM | POA: Diagnosis not present

## 2016-02-09 DIAGNOSIS — I82442 Acute embolism and thrombosis of left tibial vein: Secondary | ICD-10-CM | POA: Diagnosis present

## 2016-02-09 DIAGNOSIS — H9201 Otalgia, right ear: Secondary | ICD-10-CM

## 2016-02-09 DIAGNOSIS — Z89439 Acquired absence of unspecified foot: Secondary | ICD-10-CM

## 2016-02-09 DIAGNOSIS — M4646 Discitis, unspecified, lumbar region: Secondary | ICD-10-CM | POA: Diagnosis not present

## 2016-02-09 DIAGNOSIS — M86672 Other chronic osteomyelitis, left ankle and foot: Secondary | ICD-10-CM | POA: Diagnosis not present

## 2016-02-09 DIAGNOSIS — M4640 Discitis, unspecified, site unspecified: Secondary | ICD-10-CM

## 2016-02-09 DIAGNOSIS — Z79899 Other long term (current) drug therapy: Secondary | ICD-10-CM

## 2016-02-09 DIAGNOSIS — Z452 Encounter for adjustment and management of vascular access device: Secondary | ICD-10-CM | POA: Diagnosis not present

## 2016-02-09 DIAGNOSIS — R030 Elevated blood-pressure reading, without diagnosis of hypertension: Secondary | ICD-10-CM | POA: Diagnosis not present

## 2016-02-09 DIAGNOSIS — Z89432 Acquired absence of left foot: Secondary | ICD-10-CM

## 2016-02-09 DIAGNOSIS — I82492 Acute embolism and thrombosis of other specified deep vein of left lower extremity: Secondary | ICD-10-CM | POA: Diagnosis not present

## 2016-02-09 DIAGNOSIS — E785 Hyperlipidemia, unspecified: Secondary | ICD-10-CM | POA: Diagnosis present

## 2016-02-09 DIAGNOSIS — D638 Anemia in other chronic diseases classified elsewhere: Secondary | ICD-10-CM | POA: Diagnosis present

## 2016-02-09 DIAGNOSIS — B9561 Methicillin susceptible Staphylococcus aureus infection as the cause of diseases classified elsewhere: Secondary | ICD-10-CM | POA: Diagnosis present

## 2016-02-09 DIAGNOSIS — Q2112 Patent foramen ovale: Secondary | ICD-10-CM

## 2016-02-09 DIAGNOSIS — M25511 Pain in right shoulder: Secondary | ICD-10-CM | POA: Diagnosis not present

## 2016-02-09 DIAGNOSIS — F329 Major depressive disorder, single episode, unspecified: Secondary | ICD-10-CM | POA: Diagnosis present

## 2016-02-09 DIAGNOSIS — I2699 Other pulmonary embolism without acute cor pulmonale: Principal | ICD-10-CM | POA: Diagnosis present

## 2016-02-09 DIAGNOSIS — I10 Essential (primary) hypertension: Secondary | ICD-10-CM

## 2016-02-09 DIAGNOSIS — M4620 Osteomyelitis of vertebra, site unspecified: Secondary | ICD-10-CM

## 2016-02-09 DIAGNOSIS — B9689 Other specified bacterial agents as the cause of diseases classified elsewhere: Secondary | ICD-10-CM | POA: Diagnosis not present

## 2016-02-09 DIAGNOSIS — J90 Pleural effusion, not elsewhere classified: Secondary | ICD-10-CM | POA: Diagnosis present

## 2016-02-09 DIAGNOSIS — I6340 Cerebral infarction due to embolism of unspecified cerebral artery: Secondary | ICD-10-CM | POA: Diagnosis present

## 2016-02-09 DIAGNOSIS — R0602 Shortness of breath: Secondary | ICD-10-CM | POA: Diagnosis not present

## 2016-02-09 DIAGNOSIS — K219 Gastro-esophageal reflux disease without esophagitis: Secondary | ICD-10-CM | POA: Diagnosis not present

## 2016-02-09 DIAGNOSIS — Z7982 Long term (current) use of aspirin: Secondary | ICD-10-CM

## 2016-02-09 DIAGNOSIS — F419 Anxiety disorder, unspecified: Secondary | ICD-10-CM | POA: Diagnosis present

## 2016-02-09 DIAGNOSIS — Z95828 Presence of other vascular implants and grafts: Secondary | ICD-10-CM | POA: Diagnosis not present

## 2016-02-09 DIAGNOSIS — E876 Hypokalemia: Secondary | ICD-10-CM | POA: Diagnosis not present

## 2016-02-09 DIAGNOSIS — R310 Gross hematuria: Secondary | ICD-10-CM | POA: Diagnosis not present

## 2016-02-09 LAB — PROTIME-INR
INR: 1.39 (ref 0.00–1.49)
Prothrombin Time: 17.1 seconds — ABNORMAL HIGH (ref 11.6–15.2)

## 2016-02-09 LAB — COMPREHENSIVE METABOLIC PANEL
ALBUMIN: 1.5 g/dL — AB (ref 3.5–5.0)
ALK PHOS: 79 U/L (ref 38–126)
ALT: 7 U/L — AB (ref 14–54)
ANION GAP: 11 (ref 5–15)
AST: 12 U/L — ABNORMAL LOW (ref 15–41)
BILIRUBIN TOTAL: 1.4 mg/dL — AB (ref 0.3–1.2)
BUN: 6 mg/dL (ref 6–20)
CALCIUM: 8.5 mg/dL — AB (ref 8.9–10.3)
CO2: 25 mmol/L (ref 22–32)
CREATININE: 0.78 mg/dL (ref 0.44–1.00)
Chloride: 102 mmol/L (ref 101–111)
GFR calc non Af Amer: >60 mL/min (ref >60)
GLUCOSE: 99 mg/dL (ref 65–99)
Potassium: 3.4 mmol/L — ABNORMAL LOW (ref 3.5–5.1)
Sodium: 138 mmol/L (ref 135–145)
TOTAL PROTEIN: 6.3 g/dL — AB (ref 6.5–8.1)

## 2016-02-09 LAB — CBC WITH DIFFERENTIAL/PLATELET
BASOS ABS: 0 10*3/uL (ref 0.0–0.1)
BASOS PCT: 0 % (ref 0–1)
BASOS PCT: 0 %
Basophils Absolute: 0 10*3/uL (ref 0.0–0.1)
EOS ABS: 2.2 10*3/uL — AB (ref 0.0–0.7)
EOS PCT: 18 %
Eosinophils Absolute: 2.1 10*3/uL — ABNORMAL HIGH (ref 0.0–0.7)
Eosinophils Relative: 17 % — ABNORMAL HIGH (ref 0–5)
HCT: 24.9 % — ABNORMAL LOW (ref 36.0–46.0)
HCT: 25.6 % — ABNORMAL LOW (ref 36.0–46.0)
Hemoglobin: 8 g/dL — ABNORMAL LOW (ref 12.0–15.0)
Hemoglobin: 8.3 g/dL — ABNORMAL LOW (ref 12.0–15.0)
LYMPHS PCT: 11 %
Lymphocytes Relative: 9 % — ABNORMAL LOW (ref 12–46)
Lymphs Abs: 1.2 10*3/uL (ref 0.7–4.0)
Lymphs Abs: 1.3 10*3/uL (ref 0.7–4.0)
MCH: 28.6 pg (ref 26.0–34.0)
MCH: 28.5 pg (ref 26.0–34.0)
MCHC: 32.1 g/dL (ref 30.0–36.0)
MCHC: 32.4 g/dL (ref 30.0–36.0)
MCV: 88.9 fL (ref 78.0–100.0)
MCV: 88 fL (ref 78.0–100.0)
MONO ABS: 0.8 10*3/uL (ref 0.1–1.0)
MONO ABS: 0.7 10*3/uL (ref 0.1–1.0)
MONOS PCT: 6 % (ref 3–12)
MPV: 7.7 fL — ABNORMAL LOW (ref 8.6–12.4)
Monocytes Relative: 6 %
NEUTROS ABS: 7.4 10*3/uL (ref 1.7–7.7)
NEUTROS PCT: 68 % (ref 43–77)
Neutro Abs: 8.7 10*3/uL — ABNORMAL HIGH (ref 1.7–7.7)
Neutrophils Relative %: 65 %
PLATELETS: 461 10*3/uL — AB (ref 150–400)
Platelets: 447 10*3/uL — ABNORMAL HIGH (ref 150–400)
RBC: 2.8 MIL/uL — ABNORMAL LOW (ref 3.87–5.11)
RBC: 2.91 MIL/uL — AB (ref 3.87–5.11)
RDW: 15.6 % — ABNORMAL HIGH (ref 11.5–15.5)
RDW: 15.9 % — ABNORMAL HIGH (ref 11.5–15.5)
WBC: 12.8 10*3/uL — ABNORMAL HIGH (ref 4.0–10.5)
WBC: 11.6 10*3/uL — AB (ref 4.0–10.5)

## 2016-02-09 LAB — SEDIMENTATION RATE: SED RATE: 81 mm/h — AB (ref 0–30)

## 2016-02-09 MED ORDER — NAFCILLIN SODIUM 2 G IJ SOLR
2.0000 g | Freq: Once | INTRAVENOUS | Status: AC
  Administered 2016-02-10: 2 g via INTRAVENOUS
  Filled 2016-02-09: qty 2000

## 2016-02-09 MED ORDER — ALTEPLASE 2 MG IJ SOLR
2.0000 mg | Freq: Once | INTRAMUSCULAR | Status: AC
  Administered 2016-02-09: 2 mg
  Filled 2016-02-09 (×2): qty 2

## 2016-02-09 MED ORDER — SODIUM CHLORIDE 0.9% FLUSH
10.0000 mL | INTRAVENOUS | Status: DC | PRN
  Administered 2016-02-14: 10 mL
  Filled 2016-02-09: qty 40

## 2016-02-09 MED ORDER — IOHEXOL 300 MG/ML  SOLN
100.0000 mL | Freq: Once | INTRAMUSCULAR | Status: AC | PRN
  Administered 2016-02-09: 100 mL via INTRAVENOUS

## 2016-02-09 NOTE — H&P (Addendum)
[facility] History and Physical  DUA MEHLER NGE:[id number] DOB: 06-Jul-1933 DOA: 02/09/2016  Referring physician: ED physician PCP: Colette Ribas, MD  Specialists:   Chief Complaint: Left flank pain  HPI: Cassandra [name] is a 82 y.o. female with PMH of endocarditis on nafcillin, septic emboli of brain, chronic left food all steel myelitis (S/p of transmetatarsal amputation of left foot), splenic infarct, hypertension, hyperlipidemia, GERD, depression, PFO, who presents with left flank pain and shortness breath.  Patient reports that she has been having left flank pain for about four-month. It is associated with mild shortness of breath. The pain is persistent and moderate. She states that she had right leg edema recently which has resolved, then developed left lower leg swelling. Patient does not have cough, fever, chills. She states that her left foot surgical wound has not completely healed yet. He has chronic mild diarrhea, but had normal bowel movement today. Patient does not have symptoms of UTI, vision change, hearing loss or new unilateral weakness.  In ED, patient was found to have WBC 11.6, temperature normal, tachycardia, tachypnea, potassium 3.4, renal function okay. X-ray showed right basal opacity, CT angiogram showed multiple subsegmental pulmonary emboli without evidence of right heart strain; small bilateral pleural effusions, greater on the right, with basilar atelectasis; patchy airspace disease in the right lung could represent pneumonia and/or infarcts per radiologist. Patient is admitted to inpatient for further eval and treatment.  EKG: Independently reviewed. QTC 471, no ischemic change.  Where does patient live?   At home   Can patient participate in ADLs?  None  Review of Systems:   General: no fevers, chills, no changes in body weight, has fatigue HEENT: no blurry vision, hearing changes or sore throat Pulm: has dyspnea, no coughing,  wheezing CV: no chest pain, no palpitations Abd: no nausea, vomiting, abdominal pain, diarrhea, constipation GU: no dysuria, burning on urination, increased urinary frequency, hematuria  Ext: has left lower leg edema Neuro: no unilateral weakness, numbness, or tingling, no vision change or hearing loss Skin: no rash MSK: No muscle spasm, no deformity, no limitation of range of movement in spin Heme: No easy bruising.  Travel history: No recent long distant travel.  Allergy:  Allergies  Allergen Reactions  . Codeine Shortness Of Breath and Nausea And Vomiting  . Hydrocodone Shortness Of Breath and Nausea And Vomiting    Past Medical History  Diagnosis Date  . Hypertension   . Arthritis   . Depression   . Hyperlipidemia   . PFO (patent foramen ovale)     small  . H/O echocardiogram 2008    prolapse of post. mitral leaflet, tr MR, tr TR, EF >55%, mild concentric LVH  . H/O cardiovascular stress test 10/13/2007    low risk scan, no changes from previous test  . Heart palpitations     controlled on BB  . Hiatal hernia   . Cellulitis and abscess of toe of left foot 09/16/2014    Past Surgical History  Procedure Laterality Date  . Cholecystectomy    . Cardiac catheterization  10/2002    normal coronary arteries, normal LV function  . Incision and drainage Right 07/21/2014    Procedure: INCISION AND DRAINAGE;  Surgeon: Dallas Schimke, DPM;  Location: [facility];  Service: Podiatry;  Laterality: Right;  . Incision and drainage Left 09/17/2014    Procedure: INCISION AND DRAINAGE 1ST TOE LEFT FOOT;  Surgeon: Dallas Schimke, DPM;  Location: [facility];  Service: Podiatry;  Laterality: Left;  . Bone biopsy Left 09/17/2014    Procedure: BONE BIOPSY;  Surgeon: Dallas Schimke, DPM;  Location: [facility];  Service: Podiatry;  Laterality: Left;  . Amputation Left 09/20/2014    Procedure: AMPUTATION DIGIT (LEFT GREAT TOE);  Surgeon: Dallas Schimke, DPM;  Location: [facility];  Service: Podiatry;  Laterality: Left;  . Tee without cardioversion N/A 01/05/2016    Procedure: TRANSESOPHAGEAL ECHOCARDIOGRAM (TEE);  Surgeon: Thurmon Fair, MD;  Location: [facility] ENDOSCOPY;  Service: Cardiovascular;  Laterality: N/A;  . Amputation Left 01/08/2016    Procedure: AMPUTATION TRANSMETATARSAL LEFT FOOT and achilles tendon lengthening;  Surgeon: Toni Arthurs, MD;  Location: [facility] OR;  Service: Orthopedics;  Laterality: Left;    Social History:  reports that she has never smoked. She has never used smokeless tobacco. She reports that she does not drink alcohol or use illicit drugs.  Family History:  Family History  Problem Relation Age of Onset  . Heart disease Mother   . Cancer Father   . Kidney disease Brother   . Hypertension Daughter   . Hyperlipidemia Son      Prior to Admission medications   Medication Sig Start Date End Date Taking? Authorizing Provider  aspirin 81 MG chewable tablet Chew 81 mg by mouth daily.   Yes Historical Provider, MD  atenolol (TENORMIN) 100 MG tablet Take 1 tablet by mouth daily. 08/26/13  Yes Historical Provider, MD  Biotin 5000 MCG CAPS Take 10,000 mcg by mouth daily.    Yes Historical Provider, MD  Calcium Carbonate-Vitamin D (CALCIUM + D) 600-200 MG-UNIT TABS Take 1 tablet by mouth daily.   Yes Historical Provider, MD  carboxymethylcellulose (REFRESH PLUS) 0.5 % SOLN Place 1 drop into both eyes 3 (three) times daily as needed (Dry Eyes).   Yes Historical Provider, MD  Cyanocobalamin (VITAMIN B-12 IJ) Inject as directed every 30 (thirty) days.   Yes Historical Provider, MD  feeding supplement, ENSURE ENLIVE, (ENSURE ENLIVE) LIQD Take 237 mLs by mouth 2 (two) times daily between meals. 01/13/16  Yes Vassie Loll, MD  HYDROmorphone (DILAUDID) 2 MG tablet Take 1 mg by mouth every 4 (four) hours as needed for moderate pain or severe pain.   Yes Historical Provider, MD  isosorbide mononitrate (IMDUR) 30 MG 24 hr tablet Take 1 tablet (30 mg total) by  mouth daily. 01/13/16  Yes Vassie Loll, MD  latanoprost (XALATAN) 0.005 % ophthalmic solution Place 1 drop into both eyes at bedtime.   Yes Historical Provider, MD  losartan (COZAAR) 100 MG tablet Take 100 mg by mouth daily. 10/14/14  Yes Historical Provider, MD  Multiple Vitamins-Minerals (PRESERVISION AREDS PO) Take 1 tablet by mouth 2 (two) times daily.   Yes Historical Provider, MD  nafcillin 2 g in dextrose 5 % 100 mL Inject 2 g into the vein every 4 (four) hours. 01/13/16 02/20/16 Yes Vassie Loll, MD  nystatin (MYCOSTATIN/NYSTOP) 100000 UNIT/GM POWD Apply 1 Bottle topically 2 (two) times daily.   Yes Historical Provider, MD  ondansetron (ZOFRAN ODT) 4 MG disintegrating tablet  ODT q4 hours prn nausea/vomit 12/29/15  Yes Marily Memos, MD  pantoprazole (PROTONIX) 40 MG tablet Take 1 tablet (40 mg total) by mouth 2 (two) times daily. 01/13/16  Yes Vassie Loll, MD  potassium chloride (KLOR-CON) 20 MEQ packet Take 20 mEq by mouth daily.   Yes Historical Provider, MD  ranitidine (ZANTAC) 300 MG tablet Take 1 tablet (300 mg total) by mouth at bedtime. 01/13/16  Yes Vassie Loll,  MD  saccharomyces boulardii (FLORASTOR) 250 MG capsule Take 250 mg by mouth 2 (two) times daily.   Yes Historical Provider, MD  sertraline (ZOLOFT) 100 MG tablet Take 100 mg by mouth daily.  08/11/13  Yes Historical Provider, MD  SIMBRINZA 1-0.2 % SUSP Place 1 drop into both eyes 3 (three) times daily. 06/14/14  Yes Historical Provider, MD  simvastatin (ZOCOR) 10 MG tablet Take 10 mg by mouth at bedtime.   Yes Historical Provider, MD  traMADol (ULTRAM) 50 MG tablet Take 1 tablet (50 mg total) by mouth every 6 (six) hours as needed for severe pain. Take one or two tablets by mouth every 6 hours as needed for pain Patient taking differently: Take 50 mg by mouth 3 (three) times daily.  01/13/16  Yes Vassie Loll, MD    Physical Exam: Filed Vitals:   02/10/16 0345 02/10/16 0415 02/10/16 0430 02/10/16 0445  BP: 169/80 160/81  171/80 154/83  Pulse: 94 92 100 87  Temp:      TempSrc:      Resp: 18 18 23 14   SpO2: 94% 92% 97% 93%   General: Not in acute distress HEENT:       Eyes: PERRL, EOMI, no scleral icterus.       ENT: No discharge from the ears and nose, no pharynx injection, no tonsillar enlargement.        Neck: No JVD, no bruit, no mass felt. Heme: No neck lymph node enlargement. Cardiac: S1/S2, RRR, No murmurs, No gallops or rubs. Pulm: No rales, wheezing, rhonchi or rubs. Abd: Soft, nondistended, nontender, no rebound pain, no organomegaly, BS present. Ext: has left lower leg edema. 2+DP/PT pulse bilaterally. Left foot is wrapped no discharge. Has PICC line over right arm with clean surroundings Musculoskeletal: No joint deformities, No joint redness or warmth, no limitation of ROM in spin. Skin: No rashes.  Neuro: Alert, oriented X3, cranial nerves II-XII grossly intact, moves all extremities. Psych: Patient is not psychotic, no suicidal or hemocidal ideation.  Labs on Admission:  Basic Metabolic Panel:  Recent Labs Lab 02/08/16 1720 02/09/16 1947 02/10/16 0344  NA 133* 138 136  K 3.8 3.4* 3.5  CL 99 102 102  CO2 26 25 21*  GLUCOSE 104* 99 133*  BUN 7 6 <5*  CREATININE 0.70 0.78 0.65  CALCIUM 8.1* 8.5* 8.2*  MG  --   --  1.8   Liver Function Tests:  Recent Labs Lab 02/09/16 1947  AST 12*  ALT 7*  ALKPHOS 79  BILITOT 1.4*  PROT 6.3*  ALBUMIN 1.5*   No results for input(s): LIPASE, AMYLASE in the last 168 hours. No results for input(s): AMMONIA in the last 168 hours. CBC:  Recent Labs Lab 02/08/16 1720 02/09/16 1947 02/10/16 0344  WBC 12.8* 11.6* 10.1  NEUTROABS 8.7* 7.4  --   HGB 8.0* 8.3* 10.7*  HCT 24.9* 25.6* 33.5*  MCV 88.9 88.0 87.2  PLT 461* 447* 385   Cardiac Enzymes: No results for input(s): CKTOTAL, CKMB, CKMBINDEX, TROPONINI in the last 168 hours.  BNP (last 3 results)  Recent Labs  01/01/16 1142  BNP 432.0*    ProBNP (last 3 results) No  results for input(s): PROBNP in the last 8760 hours.  CBG: No results for input(s): GLUCAP in the last 168 hours.  Radiological Exams on Admission: Ct Angio Chest Pe W/cm &/or Wo Cm  02/09/2016  CLINICAL DATA:  Pleuritic chest pain. Recent history of bacteremia. Previous CT abdomen and pelvis demonstrated  right lower lobe pulmonary emboli. EXAM: CT ANGIOGRAPHY CHEST WITH CONTRAST TECHNIQUE: Multidetector CT imaging of the chest was performed using the standard protocol during bolus administration of intravenous contrast. Multiplanar CT image reconstructions and MIPs were obtained to evaluate the vascular anatomy. CONTRAST:  OMNIPAQUE IOHEXOL 300 MG/ML  SOLN COMPARISON:  CT abdomen and pelvis 02/09/2016 FINDINGS: Technically adequate study with good opacification of the central and segmental pulmonary arteries. Filling defects are demonstrated in peripheral subsegmental right lower lobe pulmonary arteries with at least 3 branch vessels involved. This corresponds changes seen on the previous abdominal CT scan. There may also be additional embolus to subsegmental right middle lobe branches. No large central pulmonary emboli. No evidence of right heart strain. Normal heart size. Normal caliber thoracic aorta. No aortic dissection. Calcification in the aorta and coronary arteries. Esophagus is decompressed. Mediastinal lymph nodes are not pathologically enlarged and are likely reactive. Small bilateral pleural effusions with basilar atelectasis. Patchy infiltration in the right lung, involving portions of the right upper lung, right middle lung, and right lower lung, may represent pneumonia or pulmonary infarcts. No pneumothorax. Included portions of the upper abdominal organs demonstrate splenic enlargement with heterogeneous appearance suggesting splenic infarct. No destructive bone lesions. Review of the MIP images confirms the above findings. IMPRESSION: Examination is positive for multiple subsegmental  pulmonary emboli. No evidence of right heart strain. Small bilateral pleural effusions, greater on the right, with basilar atelectasis. Patchy airspace disease in the right lung could represent pneumonia and/or infarcts. These results were called by telephone at the time of interpretation on 02/09/2016 at 11:04 pm to Dr. Renne Crigler , who verbally acknowledged these results. Electronically Signed   By: Burman Nieves M.D.   On: 02/09/2016 23:07   Dg Chest Portable 1 View  02/09/2016  CLINICAL DATA:  PICC placement. EXAM: PORTABLE CHEST 1 VIEW COMPARISON:  Chest radiograph 01/06/2016. Included lung bases from CT abdomen/pelvis earlier this day FINDINGS: Tip of the right upper extremity PICC in the proximal SVC. Hazy opacity at the right lung base, pleural effusion and atelectasis as seen on CT. The lungs are otherwise clear. The heart size is normal. There is no pneumothorax. IMPRESSION: Tip of the right upper extremity PICC in the mid proximal SVC. No pneumothorax. Right basilar opacity, pleural effusion and atelectasis. Electronically Signed   By: Rubye Oaks M.D.   On: 02/09/2016 18:50    Assessment/Plan Principal Problem:   Acute pulmonary embolism (HCC) Active Problems:   Hypertension   Hyperlipidemia   PFO (patent foramen ovale)   Cerebral embolism with cerebral infarction   Bacterial endocarditis   Chronic osteomyelitis of left foot (HCC)   S/P amputation of foot (HCC)   GERD (gastroesophageal reflux disease)   PE (pulmonary embolism)   Acute pulmonary embolism [facility]): Patient has left flank pain in the past 4 month. It is associated with shortness of breath. CT angiogram showed pulmonary embolism. A potential differential diagnosis is septic emboli versus true blood clot. It is very important to differentiate from each other since anticoagulant treatment is relatively contraindicated for septic emboli. Patient has asymmetric leg edema, indicating possible DVT, which increased  possibility of pulmonary embolism. TEE on 01/05/16 showed vegetation on mitral valve, there is no vegetation on tricuspid valve, making septic emboli less likely, but patient has PFO, making the septic emboli still possible. Patient has been on IV antibiotics for more 5 weeks, the risk of bacterial spreading is low. EDP also discussed with PICC M, Dr. Rosario Adie  who did not think there is absolute contraindication with heparin treatment.  -will admit to SDU since pt is at high risk of deteriorating --heparin drip initiated -2D echocardiogram ordered -LE dopplers ordered to evaluate for DVT -pain control: When necessary dilaudid  Endocarditis: Evidenced by blood cultures from 12/31/2015 growing MSSA. She is currently on Nafcillin 2 g IV every 4 hours per Dr. Erich MontaneSnide's recommendation, supposed to be treated until 02/20/16. Source of bacteremia felt to be left foot osteomyelitis. Pt is s/p of transmetatarsal amputation of left foot osteo.Repeat blood cultures from 01/06/2016 showing no growth to date. Per discharge summary, CVTS will reassess after antibiotics therapy completed to decide at that time needs for surgery/valvular repair.  -continue IV nafcillin -Repeat  blood culture  Chronic left foot wound/osteomyelitis . MRI of foot from 01/03/2016 reported by radiology to show possible underlying osteomyelitis, cannot rule out underlying abscess. She underwent transmetatarsal amputation of left foot for chronic osteomyelitis.  -continue IV nafcillin -outpatient follow up with orthopedic service as an outpatient   Splenic infarct. Patient complaining of abdominal pain on 01/09/2016, further worked up with CT scan of abdomen and pelvis with IV contrast, study revealed splenic infarct involving 30%, likely secondary to septic emboli.  -Continue IV antibiotics as above   HTN; -Atenolol, losartan,  HLD: Last LDL was 71 on 01/06/16 -Continue home medications: Zocor  Depression and anxiety: Stable, no  suicidal or homicidal ideations. -Continue home medications  DVT ppx: on IV heparin  Code Status: Full code Family Communication:  Yes, patient's granddaughter at bed side Disposition Plan: Admit to inpatient   Date of Service 02/10/2016    Lorretta HarpIU, Annlouise Gerety [facility] Pager [phone]  If 7PM-7AM, please contact night-coverage [email or web address] Password [email or web address] 02/10/2016, 5:15 AM

## 2016-02-09 NOTE — ED Notes (Signed)
Per EMS, Pt is coming from [facility]. Pt had CT scan today due to flank pain for multiple months. CT scan read positive for PE. Pt was transferred for evaluation. Vitals per EMS: 160/75, 82 HR, 94% on RA, 97% on 2L, 16 RR. Patient is alert and oriented x4. Pt has IV from facility due to endocarditis with daily IV medications.

## 2016-02-09 NOTE — ED Notes (Signed)
Pt back in room.

## 2016-02-09 NOTE — ED Provider Notes (Signed)
Patient seen/examined in the Emergency Department in conjunction with Midlevel Provider [facility] Patient reports pleuritic CP.  Outpatient CT imaging reveals PE, with recent h/o bacteremia Exam : awake/alert, no distress, no added lung sounds on my evaluation Plan: per radiology will need full evaluation with CT chest to evaluate PE.  She is also a set up for septic embolic given h/o bactermia    Zadie Rhineonald Kell Ferris, MD 02/09/16 [phone]

## 2016-02-09 NOTE — ED Notes (Signed)
IV Team at the bedside. 

## 2016-02-09 NOTE — ED Provider Notes (Signed)
CSN: [id number]     Arrival date & time 02/09/16  1730 History   First MD Initiated Contact with Patient 02/09/16 1731     Chief Complaint  Patient presents with  . Flank Pain    (Consider location/radiation/quality/duration/timing/severity/associated sxs/prior Treatment) HPI Comments: Patient with admission 12/2015 - 01/2016 for MSSA bacteremia currently on Nafcillin, endocarditis, multiple areas of septic emboli including the brain and spleen, + blood cultures and additional infarct events during admission despite appropriate antibiotics. Patient has been having persistent left flank pain and had a CT abdomen/pelvis performed today. She was incidentally found to have evidence of right lower lobe pulmonary embolism on this CT with associated moderate right pleural effusion and small left pleural effusion. Patient also has possible discitis and osteomyelitis at L1-L2 anteriorly with evidence of small paraspinal abscesses on the right side. Splenic infarct appears as expected. Patient is currently not on anticoagulation according to her nursing home. Patient states that she has been having anterior chest pain for the past several days. No cough, shortness of breath, hemoptysis. She states that she has had tenderness in her legs, no appreciable new swelling. H/o left forefoot amputation. She is not short of breath or requiring oxygen. Patient is on IV antibiotics through PICC line at her nursing facility ([facility]). The onset of this condition was acute. The course is constant. Aggravating factors: none. Alleviating factors: none.    The history is provided by the patient and medical records.    Past Medical History  Diagnosis Date  . Hypertension   . Arthritis   . Depression   . Hyperlipidemia   . PFO (patent foramen ovale)     small  . H/O echocardiogram 2008    prolapse of post. mitral leaflet, tr MR, tr TR, EF >55%, mild concentric LVH  . H/O cardiovascular stress test 10/13/2007    low  risk scan, no changes from previous test  . Heart palpitations     controlled on BB  . Hiatal hernia   . Cellulitis and abscess of toe of left foot 09/16/2014   Past Surgical History  Procedure Laterality Date  . Cholecystectomy    . Cardiac catheterization  10/2002    normal coronary arteries, normal LV function  . Incision and drainage Right 07/21/2014    Procedure: INCISION AND DRAINAGE;  Surgeon: Dallas Schimke, DPM;  Location: [facility];  Service: Podiatry;  Laterality: Right;  . Incision and drainage Left 09/17/2014    Procedure: INCISION AND DRAINAGE 1ST TOE LEFT FOOT;  Surgeon: Dallas Schimke, DPM;  Location: [facility];  Service: Podiatry;  Laterality: Left;  . Bone biopsy Left 09/17/2014    Procedure: BONE BIOPSY;  Surgeon: Dallas Schimke, DPM;  Location: [facility];  Service: Podiatry;  Laterality: Left;  . Amputation Left 09/20/2014    Procedure: AMPUTATION DIGIT (LEFT GREAT TOE);  Surgeon: Dallas Schimke, DPM;  Location: [facility];  Service: Podiatry;  Laterality: Left;  . Tee without cardioversion N/A 01/05/2016    Procedure: TRANSESOPHAGEAL ECHOCARDIOGRAM (TEE);  Surgeon: Thurmon Fair, MD;  Location: [facility];  Service: Cardiovascular;  Laterality: N/A;  . Amputation Left 01/08/2016    Procedure: AMPUTATION TRANSMETATARSAL LEFT FOOT and achilles tendon lengthening;  Surgeon: Toni Arthurs, MD;  Location: [facility];  Service: Orthopedics;  Laterality: Left;   Family History  Problem Relation Age of Onset  . Heart disease Mother   . Cancer Father   . Kidney disease Brother   . Hypertension Daughter   .  Hyperlipidemia Son    Social History  Substance Use Topics  . Smoking status: Never Smoker   . Smokeless tobacco: Never Used  . Alcohol Use: No   OB History    No data available     Review of Systems  Constitutional: Negative for fever.  HENT: Negative for rhinorrhea and sore throat.   Eyes: Negative for redness.  Respiratory: Negative for cough  and shortness of breath.   Cardiovascular: Positive for chest pain. Negative for leg swelling.  Gastrointestinal: Negative for nausea, vomiting, abdominal pain and diarrhea.  Genitourinary: Positive for flank pain. Negative for dysuria.  Musculoskeletal: Negative for myalgias.  Skin: Negative for rash.  Neurological: Negative for headaches.    Allergies  Codeine and Hydrocodone  Home Medications   Prior to Admission medications   Medication Sig Start Date End Date Taking? Authorizing Provider  aspirin 81 MG chewable tablet Chew 81 mg by mouth daily.    Historical Provider, MD  atenolol (TENORMIN) 100 MG tablet Take 1 tablet by mouth daily. 08/26/13   Historical Provider, MD  Biotin 5000 MCG CAPS Take 10,000 mcg by mouth daily.     Historical Provider, MD  Calcium Carbonate-Vitamin D (CALCIUM + D) 600-200 MG-UNIT TABS Take 1 tablet by mouth daily.    Historical Provider, MD  carboxymethylcellulose (REFRESH PLUS) 0.5 % SOLN Place 1 drop into both eyes 3 (three) times daily as needed (Dry Eyes).    Historical Provider, MD  Cyanocobalamin (VITAMIN B-12 IJ) Inject as directed every 30 (thirty) days.    Historical Provider, MD  feeding supplement, ENSURE ENLIVE, (ENSURE ENLIVE) LIQD Take 237 mLs by mouth 2 (two) times daily between meals. 01/13/16   Vassie Lollarlos Madera, MD  isosorbide mononitrate (IMDUR) 30 MG 24 hr tablet Take 1 tablet (30 mg total) by mouth daily. 01/13/16   Vassie Lollarlos Madera, MD  latanoprost (XALATAN) 0.005 % ophthalmic solution Place 1 drop into both eyes at bedtime.    Historical Provider, MD  losartan (COZAAR) 100 MG tablet Take 100 mg by mouth daily. 10/14/14   Historical Provider, MD  Multiple Vitamins-Minerals (PRESERVISION AREDS PO) Take 1 tablet by mouth 2 (two) times daily.    Historical Provider, MD  nafcillin 2 g in dextrose 5 % 100 mL Inject 2 g into the vein every 4 (four) hours. 01/13/16 02/20/16  Vassie Lollarlos Madera, MD  ondansetron (ZOFRAN ODT) 4 MG disintegrating tablet 4mg  ODT  q4 hours prn nausea/vomit 12/29/15   Marily MemosJason Mesner, MD  pantoprazole (PROTONIX) 40 MG tablet Take 1 tablet (40 mg total) by mouth 2 (two) times daily. 01/13/16   Vassie Lollarlos Madera, MD  ranitidine (ZANTAC) 300 MG tablet Take 1 tablet (300 mg total) by mouth at bedtime. 01/13/16   Vassie Lollarlos Madera, MD  sertraline (ZOLOFT) 100 MG tablet Take 100 mg by mouth daily.  08/11/13   Historical Provider, MD  SIMBRINZA 1-0.2 % SUSP Place 1 drop into both eyes 3 (three) times daily. 06/14/14   Historical Provider, MD  simvastatin (ZOCOR) 10 MG tablet Take 10 mg by mouth at bedtime.    Historical Provider, MD  traMADol (ULTRAM) 50 MG tablet Take 1 tablet (50 mg total) by mouth every 6 (six) hours as needed for severe pain. Take one or two tablets by mouth every 6 hours as needed for pain 01/13/16   Vassie Lollarlos Madera, MD   BP 156/69 mmHg  Pulse 83  Temp(Src) 98.1 F (36.7 C) (Oral)  Resp 16  SpO2 95%   Physical Exam  Constitutional:  She appears well-developed and well-nourished.  HENT:  Head: Normocephalic and atraumatic.  Eyes: Conjunctivae are normal. Right eye exhibits no discharge. Left eye exhibits no discharge.  Neck: Normal range of motion. Neck supple.  Cardiovascular: Normal rate, regular rhythm and normal heart sounds.   No murmur heard. Pulmonary/Chest: Effort normal and breath sounds normal. No respiratory distress. She has no wheezes. She has no rales.  Abdominal: Soft. Bowel sounds are normal. She exhibits no distension. There is tenderness (LUQ, moderate). There is no rebound and no guarding.  Neurological: She is alert.  Skin: Skin is warm and dry.  Psychiatric: She has a normal mood and affect.  Nursing note and vitals reviewed.   ED Course  Procedures (including critical care time) Labs Review Labs Reviewed  CBC WITH DIFFERENTIAL/PLATELET - Abnormal; Notable for the following:    WBC 11.6 (*)    RBC 2.91 (*)    Hemoglobin 8.3 (*)    HCT 25.6 (*)    RDW 15.9 (*)    Platelets 447 (*)     Eosinophils Absolute 2.1 (*)    All other components within normal limits  COMPREHENSIVE METABOLIC PANEL - Abnormal; Notable for the following:    Potassium 3.4 (*)    Calcium 8.5 (*)    Total Protein 6.3 (*)    Albumin 1.5 (*)    AST 12 (*)    ALT 7 (*)    Total Bilirubin 1.4 (*)    All other components within normal limits  PROTIME-INR - Abnormal; Notable for the following:    Prothrombin Time 17.1 (*)    All other components within normal limits  CULTURE, BLOOD (ROUTINE X 2)  CULTURE, BLOOD (ROUTINE X 2)  URINE CULTURE  URINALYSIS, ROUTINE W REFLEX MICROSCOPIC (NOT AT [facility])  MAGNESIUM  BASIC METABOLIC PANEL  CBC    Imaging Review Ct Angio Chest Pe W/cm &/or Wo Cm  02/09/2016  CLINICAL DATA:  Pleuritic chest pain. Recent history of bacteremia. Previous CT abdomen and pelvis demonstrated right lower lobe pulmonary emboli. EXAM: CT ANGIOGRAPHY CHEST WITH CONTRAST TECHNIQUE: Multidetector CT imaging of the chest was performed using the standard protocol during bolus administration of intravenous contrast. Multiplanar CT image reconstructions and MIPs were obtained to evaluate the vascular anatomy. CONTRAST:  OMNIPAQUE IOHEXOL 300 MG/ML  SOLN COMPARISON:  CT abdomen and pelvis 02/09/2016 FINDINGS: Technically adequate study with good opacification of the central and segmental pulmonary arteries. Filling defects are demonstrated in peripheral subsegmental right lower lobe pulmonary arteries with at least 3 branch vessels involved. This corresponds changes seen on the previous abdominal CT scan. There may also be additional embolus to subsegmental right middle lobe branches. No large central pulmonary emboli. No evidence of right heart strain. Normal heart size. Normal caliber thoracic aorta. No aortic dissection. Calcification in the aorta and coronary arteries. Esophagus is decompressed. Mediastinal lymph nodes are not pathologically enlarged and are likely reactive. Small bilateral  pleural effusions with basilar atelectasis. Patchy infiltration in the right lung, involving portions of the right upper lung, right middle lung, and right lower lung, may represent pneumonia or pulmonary infarcts. No pneumothorax. Included portions of the upper abdominal organs demonstrate splenic enlargement with heterogeneous appearance suggesting splenic infarct. No destructive bone lesions. Review of the MIP images confirms the above findings. IMPRESSION: Examination is positive for multiple subsegmental pulmonary emboli. No evidence of right heart strain. Small bilateral pleural effusions, greater on the right, with basilar atelectasis. Patchy airspace disease in the right lung  could represent pneumonia and/or infarcts. These results were called by telephone at the time of interpretation on 02/09/2016 at 11:04 pm to Dr. Renne Crigler , who verbally acknowledged these results. Electronically Signed   By: Burman Nieves M.D.   On: 02/09/2016 23:07   Dg Chest Portable 1 View  02/09/2016  CLINICAL DATA:  PICC placement. EXAM: PORTABLE CHEST 1 VIEW COMPARISON:  Chest radiograph 01/06/2016. Included lung bases from CT abdomen/pelvis earlier this day FINDINGS: Tip of the right upper extremity PICC in the proximal SVC. Hazy opacity at the right lung base, pleural effusion and atelectasis as seen on CT. The lungs are otherwise clear. The heart size is normal. There is no pneumothorax. IMPRESSION: Tip of the right upper extremity PICC in the mid proximal SVC. No pneumothorax. Right basilar opacity, pleural effusion and atelectasis. Electronically Signed   By: Rubye Oaks M.D.   On: 02/09/2016 18:50   I have personally reviewed and evaluated these images and lab results as part of my medical decision-making.   EKG Interpretation   Date/Time:  Thursday February 09 2016 18:25:42 EST Ventricular Rate:  92 PR Interval:  140 QRS Duration: 90 QT Interval:  381 QTC Calculation: 471 R Axis:   12 Text  Interpretation:  Sinus rhythm Non-specific ST-t changes No  significant change since last tracing Confirmed by Bebe Shaggy  MD, DONALD  [phone]) on 02/09/2016 6:30:24 PM       5:50 PM Patient seen and examined. .   Vital signs reviewed and are as follows: BP 156/69 mmHg  Pulse 83  Temp(Src) 98.1 F (36.7 C) (Oral)  Resp 16  SpO2 95%  Imaging was not in EPIC. Spoke with patient's nurse at [facility] who could not tell me what imaging patient had performed today, where they had it performed. She states that they received call from radiologist office and no written reports.   Fortunately, today's imaging was then found when previous CT was examined.   Patient discussed with and seen by Dr. Bebe Shaggy.   12:42 AM Imaging completed Patient and family updated. Pt admitted to hospitalist, Dr. Clyde Lundborg. I did speak with pulmonary. Dr. Darrick Penna, as to whether or not anticoagulation would be appropriate in this case. She states she doesn't know of any contraindications to anticoagulation.    Will defer anticoagulation decision to hospitalist.   MDM   Final diagnoses:  Acute septic pulmonary embolism without acute cor pulmonale (HCC)  Discitis of lumbar region  Bacterial endocarditis    Admit.    Renne Crigler, PA-C 02/10/16 6045  Zadie Rhine, MD 02/10/16 [phone]

## 2016-02-10 ENCOUNTER — Inpatient Hospital Stay (HOSPITAL_COMMUNITY): Payer: Medicare Other

## 2016-02-10 DIAGNOSIS — F419 Anxiety disorder, unspecified: Secondary | ICD-10-CM | POA: Diagnosis present

## 2016-02-10 DIAGNOSIS — B9561 Methicillin susceptible Staphylococcus aureus infection as the cause of diseases classified elsewhere: Secondary | ICD-10-CM | POA: Diagnosis not present

## 2016-02-10 DIAGNOSIS — M4806 Spinal stenosis, lumbar region: Secondary | ICD-10-CM | POA: Diagnosis not present

## 2016-02-10 DIAGNOSIS — I2699 Other pulmonary embolism without acute cor pulmonale: Secondary | ICD-10-CM | POA: Diagnosis present

## 2016-02-10 DIAGNOSIS — M86672 Other chronic osteomyelitis, left ankle and foot: Secondary | ICD-10-CM | POA: Diagnosis not present

## 2016-02-10 DIAGNOSIS — N319 Neuromuscular dysfunction of bladder, unspecified: Secondary | ICD-10-CM | POA: Diagnosis not present

## 2016-02-10 DIAGNOSIS — R339 Retention of urine, unspecified: Secondary | ICD-10-CM | POA: Diagnosis not present

## 2016-02-10 DIAGNOSIS — I6340 Cerebral infarction due to embolism of unspecified cerebral artery: Secondary | ICD-10-CM | POA: Diagnosis not present

## 2016-02-10 DIAGNOSIS — G061 Intraspinal abscess and granuloma: Secondary | ICD-10-CM | POA: Diagnosis not present

## 2016-02-10 DIAGNOSIS — I63133 Cerebral infarction due to embolism of bilateral carotid arteries: Secondary | ICD-10-CM | POA: Diagnosis not present

## 2016-02-10 DIAGNOSIS — I82442 Acute embolism and thrombosis of left tibial vein: Secondary | ICD-10-CM | POA: Diagnosis present

## 2016-02-10 DIAGNOSIS — I10 Essential (primary) hypertension: Secondary | ICD-10-CM | POA: Diagnosis not present

## 2016-02-10 DIAGNOSIS — M4626 Osteomyelitis of vertebra, lumbar region: Secondary | ICD-10-CM | POA: Diagnosis present

## 2016-02-10 DIAGNOSIS — Z960 Presence of urogenital implants: Secondary | ICD-10-CM | POA: Diagnosis not present

## 2016-02-10 DIAGNOSIS — H9201 Otalgia, right ear: Secondary | ICD-10-CM | POA: Diagnosis not present

## 2016-02-10 DIAGNOSIS — Q211 Atrial septal defect: Secondary | ICD-10-CM | POA: Diagnosis not present

## 2016-02-10 DIAGNOSIS — D638 Anemia in other chronic diseases classified elsewhere: Secondary | ICD-10-CM | POA: Diagnosis present

## 2016-02-10 DIAGNOSIS — N390 Urinary tract infection, site not specified: Secondary | ICD-10-CM | POA: Diagnosis not present

## 2016-02-10 DIAGNOSIS — M4640 Discitis, unspecified, site unspecified: Secondary | ICD-10-CM | POA: Diagnosis not present

## 2016-02-10 DIAGNOSIS — Z89432 Acquired absence of left foot: Secondary | ICD-10-CM | POA: Diagnosis not present

## 2016-02-10 DIAGNOSIS — Z89422 Acquired absence of other left toe(s): Secondary | ICD-10-CM | POA: Diagnosis not present

## 2016-02-10 DIAGNOSIS — K219 Gastro-esophageal reflux disease without esophagitis: Secondary | ICD-10-CM | POA: Diagnosis present

## 2016-02-10 DIAGNOSIS — I330 Acute and subacute infective endocarditis: Secondary | ICD-10-CM | POA: Diagnosis not present

## 2016-02-10 DIAGNOSIS — F329 Major depressive disorder, single episode, unspecified: Secondary | ICD-10-CM | POA: Diagnosis present

## 2016-02-10 DIAGNOSIS — B9689 Other specified bacterial agents as the cause of diseases classified elsewhere: Secondary | ICD-10-CM | POA: Diagnosis not present

## 2016-02-10 DIAGNOSIS — R2681 Unsteadiness on feet: Secondary | ICD-10-CM | POA: Diagnosis not present

## 2016-02-10 DIAGNOSIS — Z95828 Presence of other vascular implants and grafts: Secondary | ICD-10-CM | POA: Diagnosis not present

## 2016-02-10 DIAGNOSIS — A419 Sepsis, unspecified organism: Secondary | ICD-10-CM | POA: Diagnosis not present

## 2016-02-10 DIAGNOSIS — M4627 Osteomyelitis of vertebra, lumbosacral region: Secondary | ICD-10-CM | POA: Diagnosis not present

## 2016-02-10 DIAGNOSIS — I82402 Acute embolism and thrombosis of unspecified deep veins of left lower extremity: Secondary | ICD-10-CM

## 2016-02-10 DIAGNOSIS — Z79899 Other long term (current) drug therapy: Secondary | ICD-10-CM | POA: Diagnosis not present

## 2016-02-10 DIAGNOSIS — E876 Hypokalemia: Secondary | ICD-10-CM | POA: Diagnosis not present

## 2016-02-10 DIAGNOSIS — R0602 Shortness of breath: Secondary | ICD-10-CM | POA: Diagnosis not present

## 2016-02-10 DIAGNOSIS — I82492 Acute embolism and thrombosis of other specified deep vein of left lower extremity: Secondary | ICD-10-CM | POA: Diagnosis present

## 2016-02-10 DIAGNOSIS — R278 Other lack of coordination: Secondary | ICD-10-CM | POA: Diagnosis not present

## 2016-02-10 DIAGNOSIS — B9620 Unspecified Escherichia coli [E. coli] as the cause of diseases classified elsewhere: Secondary | ICD-10-CM | POA: Diagnosis not present

## 2016-02-10 DIAGNOSIS — M6281 Muscle weakness (generalized): Secondary | ICD-10-CM | POA: Diagnosis not present

## 2016-02-10 DIAGNOSIS — E785 Hyperlipidemia, unspecified: Secondary | ICD-10-CM | POA: Diagnosis present

## 2016-02-10 DIAGNOSIS — M86072 Acute hematogenous osteomyelitis, left ankle and foot: Secondary | ICD-10-CM | POA: Diagnosis not present

## 2016-02-10 DIAGNOSIS — J90 Pleural effusion, not elsewhere classified: Secondary | ICD-10-CM | POA: Diagnosis not present

## 2016-02-10 DIAGNOSIS — M4646 Discitis, unspecified, lumbar region: Secondary | ICD-10-CM | POA: Diagnosis not present

## 2016-02-10 DIAGNOSIS — Z7982 Long term (current) use of aspirin: Secondary | ICD-10-CM | POA: Diagnosis not present

## 2016-02-10 DIAGNOSIS — M4647 Discitis, unspecified, lumbosacral region: Secondary | ICD-10-CM | POA: Diagnosis not present

## 2016-02-10 LAB — COMPREHENSIVE METABOLIC PANEL
ALT: 7 U/L — AB (ref 14–54)
ANION GAP: 11 (ref 5–15)
AST: 13 U/L — ABNORMAL LOW (ref 15–41)
Albumin: 1.4 g/dL — ABNORMAL LOW (ref 3.5–5.0)
Alkaline Phosphatase: 72 U/L (ref 38–126)
BUN: <5 mg/dL — ABNORMAL LOW (ref 6–20)
CHLORIDE: 101 mmol/L (ref 101–111)
CO2: 24 mmol/L (ref 22–32)
CREATININE: 0.73 mg/dL (ref 0.44–1.00)
Calcium: 8.4 mg/dL — ABNORMAL LOW (ref 8.9–10.3)
Glucose, Bld: 104 mg/dL — ABNORMAL HIGH (ref 65–99)
POTASSIUM: 3.1 mmol/L — AB (ref 3.5–5.1)
Sodium: 136 mmol/L (ref 135–145)
TOTAL PROTEIN: 5.7 g/dL — AB (ref 6.5–8.1)
Total Bilirubin: 1.5 mg/dL — ABNORMAL HIGH (ref 0.3–1.2)

## 2016-02-10 LAB — BASIC METABOLIC PANEL
Anion gap: 13 (ref 5–15)
CHLORIDE: 102 mmol/L (ref 101–111)
CO2: 21 mmol/L — AB (ref 22–32)
CREATININE: 0.65 mg/dL (ref 0.44–1.00)
Calcium: 8.2 mg/dL — ABNORMAL LOW (ref 8.9–10.3)
GFR calc Af Amer: >60 mL/min (ref >60)
GFR calc non Af Amer: >60 mL/min (ref >60)
GLUCOSE: 133 mg/dL — AB (ref 65–99)
POTASSIUM: 3.5 mmol/L (ref 3.5–5.1)
SODIUM: 136 mmol/L (ref 135–145)

## 2016-02-10 LAB — GLUCOSE, CAPILLARY: GLUCOSE-CAPILLARY: 109 mg/dL — AB (ref 65–99)

## 2016-02-10 LAB — URINE MICROSCOPIC-ADD ON

## 2016-02-10 LAB — CBC
HEMATOCRIT: 33.5 % — AB (ref 36.0–46.0)
Hemoglobin: 10.7 g/dL — ABNORMAL LOW (ref 12.0–15.0)
MCH: 27.9 pg (ref 26.0–34.0)
MCHC: 31.9 g/dL (ref 30.0–36.0)
MCV: 87.2 fL (ref 78.0–100.0)
Platelets: 385 10*3/uL (ref 150–400)
RBC: 3.84 MIL/uL — ABNORMAL LOW (ref 3.87–5.11)
RDW: 16.4 % — AB (ref 11.5–15.5)
WBC: 10.1 10*3/uL (ref 4.0–10.5)

## 2016-02-10 LAB — URINALYSIS, ROUTINE W REFLEX MICROSCOPIC
Bilirubin Urine: NEGATIVE
GLUCOSE, UA: NEGATIVE mg/dL
Ketones, ur: NEGATIVE mg/dL
Nitrite: POSITIVE — AB
PH: 7 (ref 5.0–8.0)
PROTEIN: NEGATIVE mg/dL
Specific Gravity, Urine: 1.041 — ABNORMAL HIGH (ref 1.005–1.030)

## 2016-02-10 LAB — MRSA PCR SCREENING: MRSA BY PCR: NEGATIVE

## 2016-02-10 LAB — HEPARIN LEVEL (UNFRACTIONATED)
HEPARIN UNFRACTIONATED: 0.37 [IU]/mL (ref 0.30–0.70)
HEPARIN UNFRACTIONATED: 0.37 [IU]/mL (ref 0.30–0.70)

## 2016-02-10 LAB — MAGNESIUM: Magnesium: 1.8 mg/dL (ref 1.7–2.4)

## 2016-02-10 MED ORDER — TRAMADOL HCL 50 MG PO TABS
50.0000 mg | ORAL_TABLET | Freq: Three times a day (TID) | ORAL | Status: DC
  Administered 2016-02-10 – 2016-02-14 (×13): 50 mg via ORAL
  Filled 2016-02-10 (×13): qty 1

## 2016-02-10 MED ORDER — SODIUM CHLORIDE 0.9% FLUSH
3.0000 mL | Freq: Two times a day (BID) | INTRAVENOUS | Status: DC
  Administered 2016-02-10 – 2016-02-16 (×12): 3 mL via INTRAVENOUS

## 2016-02-10 MED ORDER — FAMOTIDINE 20 MG PO TABS
40.0000 mg | ORAL_TABLET | Freq: Every day | ORAL | Status: DC
  Administered 2016-02-10 – 2016-02-16 (×7): 40 mg via ORAL
  Filled 2016-02-10 (×7): qty 2

## 2016-02-10 MED ORDER — ACETAMINOPHEN 650 MG RE SUPP: 650.0000 mg | Freq: Four times a day (QID) | RECTAL | Status: DC | PRN

## 2016-02-10 MED ORDER — POLYVINYL ALCOHOL 1.4 % OP SOLN: 1.0000 [drp] | Freq: Three times a day (TID) | OPHTHALMIC | Status: DC | PRN

## 2016-02-10 MED ORDER — LOSARTAN POTASSIUM 50 MG PO TABS
100.0000 mg | ORAL_TABLET | Freq: Every day | ORAL | Status: DC
  Administered 2016-02-10 – 2016-02-16 (×7): 100 mg via ORAL
  Filled 2016-02-10 (×8): qty 2

## 2016-02-10 MED ORDER — ASPIRIN 81 MG PO CHEW
81.0000 mg | CHEWABLE_TABLET | Freq: Every day | ORAL | Status: DC
  Administered 2016-02-10 – 2016-02-16 (×7): 81 mg via ORAL
  Filled 2016-02-10 (×7): qty 1

## 2016-02-10 MED ORDER — NAFCILLIN SODIUM 2 G IJ SOLR
2.0000 g | INTRAVENOUS | Status: DC
  Administered 2016-02-10 – 2016-02-16 (×38): 2 g via INTRAVENOUS
  Filled 2016-02-10 (×50): qty 2000

## 2016-02-10 MED ORDER — OCUVITE-LUTEIN PO CAPS
1.0000 | ORAL_CAPSULE | Freq: Two times a day (BID) | ORAL | Status: DC
  Filled 2016-02-10: qty 1

## 2016-02-10 MED ORDER — LATANOPROST 0.005 % OP SOLN
1.0000 [drp] | Freq: Every day | OPHTHALMIC | Status: DC
Start: 2016-02-10 — End: 2016-02-16
  Administered 2016-02-10 – 2016-02-15 (×6): 1 [drp] via OPHTHALMIC
  Filled 2016-02-10: qty 2.5

## 2016-02-10 MED ORDER — PANTOPRAZOLE SODIUM 40 MG PO TBEC
40.0000 mg | DELAYED_RELEASE_TABLET | Freq: Two times a day (BID) | ORAL | Status: DC
  Administered 2016-02-10 – 2016-02-16 (×14): 40 mg via ORAL
  Filled 2016-02-10 (×14): qty 1

## 2016-02-10 MED ORDER — SIMVASTATIN 5 MG PO TABS
10.0000 mg | ORAL_TABLET | Freq: Every day | ORAL | Status: DC
  Administered 2016-02-10 – 2016-02-15 (×7): 10 mg via ORAL
  Filled 2016-02-10: qty 1
  Filled 2016-02-10 (×3): qty 2
  Filled 2016-02-10 (×3): qty 1

## 2016-02-10 MED ORDER — HYDROMORPHONE HCL 2 MG PO TABS: 1.0000 mg | ORAL_TABLET | ORAL | Status: DC | PRN

## 2016-02-10 MED ORDER — ONDANSETRON 4 MG PO TBDP
4.0000 mg | ORAL_TABLET | Freq: Three times a day (TID) | ORAL | Status: DC | PRN
  Administered 2016-02-10 – 2016-02-15 (×4): 4 mg via ORAL
  Filled 2016-02-10 (×5): qty 1

## 2016-02-10 MED ORDER — BRIMONIDINE TARTRATE 0.2 % OP SOLN
1.0000 [drp] | Freq: Three times a day (TID) | OPHTHALMIC | Status: DC
  Administered 2016-02-10 – 2016-02-16 (×17): 1 [drp] via OPHTHALMIC
  Filled 2016-02-10: qty 5

## 2016-02-10 MED ORDER — HEPARIN (PORCINE) IN NACL 100-0.45 UNIT/ML-% IJ SOLN
1300.0000 [IU]/h | INTRAMUSCULAR | Status: DC
  Administered 2016-02-10: 1200 [IU]/h via INTRAVENOUS
  Administered 2016-02-10: 1250 [IU]/h via INTRAVENOUS
  Administered 2016-02-11 – 2016-02-14 (×4): 1300 [IU]/h via INTRAVENOUS
  Filled 2016-02-10 (×7): qty 250

## 2016-02-10 MED ORDER — POTASSIUM CHLORIDE 20 MEQ/15ML (10%) PO SOLN
20.0000 meq | Freq: Once | ORAL | Status: AC
  Administered 2016-02-10: 20 meq via ORAL
  Filled 2016-02-10: qty 15

## 2016-02-10 MED ORDER — SERTRALINE HCL 100 MG PO TABS
100.0000 mg | ORAL_TABLET | Freq: Every day | ORAL | Status: DC
  Administered 2016-02-10 – 2016-02-16 (×7): 100 mg via ORAL
  Filled 2016-02-10 (×2): qty 2
  Filled 2016-02-10 (×2): qty 1
  Filled 2016-02-10: qty 2
  Filled 2016-02-10: qty 1
  Filled 2016-02-10: qty 2

## 2016-02-10 MED ORDER — BRINZOLAMIDE 1 % OP SUSP
1.0000 [drp] | Freq: Three times a day (TID) | OPHTHALMIC | Status: DC
  Administered 2016-02-10 – 2016-02-16 (×18): 1 [drp] via OPHTHALMIC
  Filled 2016-02-10: qty 10

## 2016-02-10 MED ORDER — CALCIUM CARBONATE-VITAMIN D 500-200 MG-UNIT PO TABS
1.0000 | ORAL_TABLET | Freq: Every day | ORAL | Status: DC
  Administered 2016-02-10 – 2016-02-14 (×5): 1 via ORAL
  Filled 2016-02-10 (×8): qty 1

## 2016-02-10 MED ORDER — PROSIGHT PO TABS
1.0000 | ORAL_TABLET | Freq: Two times a day (BID) | ORAL | Status: DC
  Administered 2016-02-10 – 2016-02-14 (×9): 1 via ORAL
  Filled 2016-02-10 (×13): qty 1

## 2016-02-10 MED ORDER — ISOSORBIDE MONONITRATE ER 30 MG PO TB24
30.0000 mg | ORAL_TABLET | Freq: Every day | ORAL | Status: DC
  Administered 2016-02-10 – 2016-02-16 (×7): 30 mg via ORAL
  Filled 2016-02-10 (×8): qty 1

## 2016-02-10 MED ORDER — SACCHAROMYCES BOULARDII 250 MG PO CAPS
250.0000 mg | ORAL_CAPSULE | Freq: Two times a day (BID) | ORAL | Status: DC
  Administered 2016-02-10 – 2016-02-16 (×14): 250 mg via ORAL
  Filled 2016-02-10 (×17): qty 1

## 2016-02-10 MED ORDER — ENSURE ENLIVE PO LIQD
237.0000 mL | Freq: Two times a day (BID) | ORAL | Status: DC
  Administered 2016-02-11 – 2016-02-16 (×11): 237 mL via ORAL
  Filled 2016-02-10 (×9): qty 237

## 2016-02-10 MED ORDER — HEPARIN BOLUS VIA INFUSION
4000.0000 [IU] | Freq: Once | INTRAVENOUS | Status: AC
  Administered 2016-02-10: 4000 [IU] via INTRAVENOUS
  Filled 2016-02-10: qty 4000

## 2016-02-10 MED ORDER — ATENOLOL 100 MG PO TABS
100.0000 mg | ORAL_TABLET | Freq: Every day | ORAL | Status: DC
  Administered 2016-02-10 – 2016-02-16 (×7): 100 mg via ORAL
  Filled 2016-02-10 (×2): qty 2
  Filled 2016-02-10: qty 1
  Filled 2016-02-10: qty 2
  Filled 2016-02-10: qty 1
  Filled 2016-02-10: qty 2
  Filled 2016-02-10: qty 1

## 2016-02-10 MED ORDER — ACETAMINOPHEN 325 MG PO TABS: 650.0000 mg | ORAL_TABLET | Freq: Four times a day (QID) | ORAL | Status: DC | PRN

## 2016-02-10 MED ORDER — NYSTATIN 100000 UNIT/GM EX POWD
1.0000 | Freq: Two times a day (BID) | CUTANEOUS | Status: DC
  Administered 2016-02-10 – 2016-02-16 (×8): 1 via TOPICAL
  Filled 2016-02-10 (×2): qty 15

## 2016-02-10 NOTE — Evaluation (Signed)
Physical Therapy Evaluation Patient Details Name: ARZU MCGAUGHEY MRN: [id number] DOB: [birth year] Today's Date: 02/10/2016   History of Present Illness  pt presents with R PE, R Pleural Effusion, Endocarditis, and Splenic Infarct.  pt with hx of L Transmetarsal Amputation, HTN, PFO, Anxiety, and Depression.    Clinical Impression  Pt very motivated, but initially nervous about OOB.  Pt and family indicate she has had very limited time OOB at SNF despite requesting pt to be up more.  Feel pt needs continued therapy and pt/family anticipate return to SNF at D/C.  Will continue to follow.      Follow Up Recommendations SNF    Equipment Recommendations  None recommended by PT    Recommendations for Other Services       Precautions / Restrictions Precautions Precautions: Fall Required Braces or Orthoses: Other Brace/Splint Other Brace/Splint: Post-op boot for L foot Restrictions Weight Bearing Restrictions: No      Mobility  Bed Mobility Overal bed mobility: Needs Assistance Bed Mobility: Supine to Sit     Supine to sit: Min assist     General bed mobility comments: pt needs increasedtime and A for scooting hips to EOB.  Transfers Overall transfer level: Needs assistance Equipment used: 2 person hand held assist;Ambulation equipment used Transfers: Sit to/from [employer] Sit to Stand: Max assist;+2 physical assistance;Mod assist Stand pivot transfers: Total assist;+2 physical assistance       General transfer comment: Initially performed sit to stand from bed with 2 person A and Max Ax2, but then utilized [place] for coming to stand from commode with ModA x2.  pt does well utilizing stedy for UE support.    Ambulation/Gait                Stairs            Wheelchair Mobility    Modified Rankin (Stroke Patients Only)       Balance Overall balance assessment: Needs assistance Sitting-balance support: Bilateral upper extremity  supported;Feet supported Sitting balance-Leahy Scale: Fair     Standing balance support: Bilateral upper extremity supported;During functional activity Standing balance-Leahy Scale: Poor Standing balance comment: Able to stand in [place] with MinA                             Pertinent Vitals/Pain Pain Assessment: 0-10 Pain Score: 5  Pain Location: Back Pain Descriptors / Indicators: Sore Pain Intervention(s): Monitored during session;Premedicated before session;Repositioned    Home Living Family/patient expects to be discharged to:: Skilled nursing facility                      Prior Function Level of Independence: Independent         Comments: Prior to Amputation pt had been independent and had been at [facility] since surgery.       Hand Dominance   Dominant Hand: Right    Extremity/Trunk Assessment   Upper Extremity Assessment: Generalized weakness           Lower Extremity Assessment: Generalized weakness      Cervical / Trunk Assessment: Kyphotic  Communication   Communication: No difficulties  Cognition Arousal/Alertness: Awake/alert Behavior During Therapy: WFL for tasks assessed/performed Overall Cognitive Status: Within Functional Limits for tasks assessed                      General Comments      Exercises  Assessment/Plan    PT Assessment Patient needs continued PT services  PT Diagnosis Difficulty walking;Generalized weakness   PT Problem List Decreased strength;Decreased activity tolerance;Decreased balance;Decreased mobility;Decreased coordination;Decreased knowledge of use of DME  PT Treatment Interventions DME instruction;Gait training;Functional mobility training;Therapeutic activities;Therapeutic exercise;Balance training;Patient/family education   PT Goals (Current goals can be found in the Care Plan section) Acute Rehab PT Goals Patient Stated Goal: Walk again. PT Goal Formulation: With  patient/family Time For Goal Achievement: 02/24/16 Potential to Achieve Goals: Good    Frequency Min 3X/week   Barriers to discharge        Co-evaluation               End of Session Equipment Utilized During Treatment: Gait belt Activity Tolerance: Patient tolerated treatment well Patient left: in chair;with call bell/phone within reach Nurse Communication: Mobility status;Need for lift equipment         Time: 1057-1130 PT Time Calculation (min) (ACUTE ONLY): 33 min   Charges:   PT Evaluation $PT Eval Moderate Complexity: 1 Procedure PT Treatments $Therapeutic Activity: 8-22 mins   PT G CodesSunny Schlein:        Adyen Bifulco F, [place]PT [phone] 02/10/2016, 1:32 PM

## 2016-02-10 NOTE — Progress Notes (Signed)
PROGRESS NOTE  Cassandra [name]:[id number] DOB: 1933/08/09 DOA: 02/09/2016 PCP: Colette Ribas, MD  HPI/Recap of past 24 hours:  On heparin drip, some mild pleuritic chest pain, on room air, denies sob, very pleasant  Assessment/Plan: Principal Problem:   Acute pulmonary embolism (HCC) Active Problems:   Hypertension   Hyperlipidemia   PFO (patent foramen ovale)   Cerebral embolism with cerebral infarction   Bacterial endocarditis   Chronic osteomyelitis of left foot (HCC)   S/P amputation of foot (HCC)   GERD (gastroesophageal reflux disease)   PE (pulmonary embolism)  Acute PE and left lower extremity  DVT: continue heparin drip, tachycardia has resolved, now hemodynamically stable, repeat echo pending  Endocarditis: Evidenced by blood cultures from 12/31/2015 growing MSSA. She is currently on Nafcillin 2 g IV every 4 hours per Dr. Erich Montane recommendation, supposed to be treated until 02/20/16. Source of bacteremia felt to be left foot osteomyelitis. Pt is s/p of transmetatarsal amputation of left foot osteo.Repeat blood cultures from 01/06/2016 showing no growth to date. Per discharge summary, CVTS will reassess after antibiotics therapy completed to decide at that time needs for surgery/valvular repair.  -continue IV nafcillin -Repeat blood culture  Chronic left foot wound/osteomyelitis . MRI of foot from 01/03/2016 reported by radiology to show possible underlying osteomyelitis, cannot rule out underlying abscess. She underwent transmetatarsal amputation of left foot for chronic osteomyelitis.  -continue IV nafcillin -outpatient follow up with orthopedic service as an outpatient   Splenic infarct. Patient complaining of abdominal pain on 01/09/2016, further worked up with CT scan of abdomen and pelvis with IV contrast, study revealed splenic infarct involving 30%, likely secondary to septic emboli.  -Continue IV antibiotics as above   HTN; -Atenolol,  losartan,  HLD: Last LDL was 71 on 01/06/16 -Continue home medications: Zocor  Depression and anxiety: Stable, no suicidal or homicidal ideations. -Continue home medications  DVT ppx: on IV heparin   Code Status: full  Family Communication: patient   Disposition Plan: remain in stepdown   Consultants:  pccm phone conversation by admitting MD DR Clyde Lundborg  Procedures:  none  Antibiotics:  nafacillin   Objective: BP 154/82 mmHg  Pulse 76  Temp(Src) 97.8 F (36.6 C) (Oral)  Resp 13  Ht  (1.727 m)  Wt 81.2 kg (179 lb 0.2 oz)  BMI 27.23 kg/m2  SpO2 95%  Intake/Output Summary (Last 24 hours) at 02/10/16 1458 Last data filed at 02/10/16 1417  Gross per 24 hour  Intake 455.42 ml  Output   1050 ml  Net -594.58 ml   Filed Weights   02/10/16 0522  Weight: 81.2 kg (179 lb 0.2 oz)    Exam:   General:  NAD  Cardiovascular: RRR  Respiratory: CTABL  Abdomen: Soft/ND/NT, positive BS  Musculoskeletal: left foot post op changes /dressing intact, left leg edema  Neuro: aaox3, subtle right hand weakness which is improving per patient  Data Reviewed: Basic Metabolic Panel:  Recent Labs Lab 02/08/16 1720 02/09/16 1947 02/10/16 0344  NA 133* 138 136  K 3.8 3.4* 3.5  CL 99 102 102  CO2 26 25 21*  GLUCOSE 104* 99 133*  BUN 7 6 <5*  CREATININE 0.70 0.78 0.65  CALCIUM 8.1* 8.5* 8.2*  MG  --   --  1.8   Liver Function Tests:  Recent Labs Lab 02/09/16 1947  AST 12*  ALT 7*  ALKPHOS 79  BILITOT 1.4*  PROT 6.3*  ALBUMIN 1.5*   No results for  input(s): LIPASE, AMYLASE in the last 168 hours. No results for input(s): AMMONIA in the last 168 hours. CBC:  Recent Labs Lab 02/08/16 1720 02/09/16 1947 02/10/16 0344  WBC 12.8* 11.6* 10.1  NEUTROABS 8.7* 7.4  --   HGB 8.0* 8.3* 10.7*  HCT 24.9* 25.6* 33.5*  MCV 88.9 88.0 87.2  PLT 461* 447* 385   Cardiac Enzymes:   No results for input(s): CKTOTAL, CKMB, CKMBINDEX, TROPONINI in the last 168  hours. BNP (last 3 results)  Recent Labs  01/01/16 1142  BNP 432.0*    ProBNP (last 3 results) No results for input(s): PROBNP in the last 8760 hours.  CBG:  Recent Labs Lab 02/10/16 0722  GLUCAP 109*    Recent Results (from the past 240 hour(s))  Culture, blood (Routine X 2) w Reflex to ID Panel     Status: None (Preliminary result)   Collection Time: 02/09/16  7:37 PM  Result Value Ref Range Status   Specimen Description BLOOD LEFT ANTECUBITAL  Final   Special Requests IN PEDIATRIC BOTTLE 1CC  Final   Culture NO GROWTH < 24 HOURS  Final   Report Status PENDING  Incomplete  Culture, blood (Routine X 2) w Reflex to ID Panel     Status: None (Preliminary result)   Collection Time: 02/09/16  7:47 PM  Result Value Ref Range Status   Specimen Description BLOOD RIGHT HAND  Final   Special Requests IN PEDIATRIC BOTTLE 1CC  Final   Culture NO GROWTH < 24 HOURS  Final   Report Status PENDING  Incomplete  MRSA PCR Screening     Status: None   Collection Time: 02/10/16  5:10 AM  Result Value Ref Range Status   MRSA by PCR NEGATIVE NEGATIVE Final    Comment:        The GeneXpert MRSA Assay (FDA approved for NASAL specimens only), is one component of a comprehensive MRSA colonization surveillance program. It is not intended to diagnose MRSA infection nor to guide or monitor treatment for MRSA infections.      Studies: Ct Angio Chest Pe W/cm &/or Wo Cm  02/09/2016  CLINICAL DATA:  Pleuritic chest pain. Recent history of bacteremia. Previous CT abdomen and pelvis demonstrated right lower lobe pulmonary emboli. EXAM: CT ANGIOGRAPHY CHEST WITH CONTRAST TECHNIQUE: Multidetector CT imaging of the chest was performed using the standard protocol during bolus administration of intravenous contrast. Multiplanar CT image reconstructions and MIPs were obtained to evaluate the vascular anatomy. CONTRAST:  100mL OMNIPAQUE IOHEXOL 300 MG/ML  SOLN COMPARISON:  CT abdomen and pelvis  02/09/2016 FINDINGS: Technically adequate study with good opacification of the central and segmental pulmonary arteries. Filling defects are demonstrated in peripheral subsegmental right lower lobe pulmonary arteries with at least 3 branch vessels involved. This corresponds changes seen on the previous abdominal CT scan. There may also be additional embolus to subsegmental right middle lobe branches. No large central pulmonary emboli. No evidence of right heart strain. Normal heart size. Normal caliber thoracic aorta. No aortic dissection. Calcification in the aorta and coronary arteries. Esophagus is decompressed. Mediastinal lymph nodes are not pathologically enlarged and are likely reactive. Small bilateral pleural effusions with basilar atelectasis. Patchy infiltration in the right lung, involving portions of the right upper lung, right middle lung, and right lower lung, may represent pneumonia or pulmonary infarcts. No pneumothorax. Included portions of the upper abdominal organs demonstrate splenic enlargement with heterogeneous appearance suggesting splenic infarct. No destructive bone lesions. Review of the MIP images  confirms the above findings. IMPRESSION: Examination is positive for multiple subsegmental pulmonary emboli. No evidence of right heart strain. Small bilateral pleural effusions, greater on the right, with basilar atelectasis. Patchy airspace disease in the right lung could represent pneumonia and/or infarcts. These results were called by telephone at the time of interpretation on 02/09/2016 at 11:04 pm to Dr. Renne Crigler , who verbally acknowledged these results. Electronically Signed   By: Burman Nieves M.D.   On: 02/09/2016 23:07   Dg Chest Portable 1 View  02/09/2016  CLINICAL DATA:  PICC placement. EXAM: PORTABLE CHEST 1 VIEW COMPARISON:  Chest radiograph 01/06/2016. Included lung bases from CT abdomen/pelvis earlier this day FINDINGS: Tip of the right upper extremity PICC in the  proximal SVC. Hazy opacity at the right lung base, pleural effusion and atelectasis as seen on CT. The lungs are otherwise clear. The heart size is normal. There is no pneumothorax. IMPRESSION: Tip of the right upper extremity PICC in the mid proximal SVC. No pneumothorax. Right basilar opacity, pleural effusion and atelectasis. Electronically Signed   By: Rubye Oaks M.D.   On: 02/09/2016 18:50    Scheduled Meds: . aspirin  81 mg Oral Daily  . atenolol  100 mg Oral Daily  . brimonidine  1 drop Both Eyes TID  . brinzolamide  1 drop Both Eyes TID  . calcium-vitamin D  1 tablet Oral Daily  . famotidine  40 mg Oral Daily  . feeding supplement (ENSURE ENLIVE)  237 mL Oral BID BM  . isosorbide mononitrate  30 mg Oral Daily  . latanoprost  1 drop Both Eyes QHS  . losartan  100 mg Oral Daily  . multivitamin  1 tablet Oral BID  . nafcillin IVPB 2 gram/100 mL D5W (Pyxis)  2 g Intravenous Q4H  . nystatin  1 Bottle Topical BID  . pantoprazole  40 mg Oral BID  . saccharomyces boulardii  250 mg Oral BID  . sertraline  100 mg Oral Daily  . simvastatin  10 mg Oral QHS  . sodium chloride flush  3 mL Intravenous Q12H  . traMADol  50 mg Oral TID    Continuous Infusions: . heparin 1,250 Units/hr (02/10/16 1400)     Time spent:  Lennyx Verdell MD, PhD  [facility] Pager [phone]. If 7PM-7AM, please contact night-coverage at [email or web address], password [facility] 02/10/2016, 2:58 PM  LOS: 0 days

## 2016-02-10 NOTE — Progress Notes (Signed)
ANTICOAGULATION CONSULT NOTE - Follow Up Consult  Pharmacy Consult for Heparin Indication: pulmonary embolus  Allergies  Allergen Reactions  . Codeine Shortness Of Breath and Nausea And Vomiting  . Hydrocodone Shortness Of Breath and Nausea And Vomiting    Patient Measurements: Height: 5\' 8"  (172.7 cm) Weight: 179 lb 0.2 oz (81.2 kg) IBW/kg (Calculated) : 63.9  Ht 69.5 in  Wt: 81 kg  IBW: 67 kg Heparin Dosing Weight: 81 kg  Vital Signs: Temp: 98.9 F (37.2 C) (03/10 2012) Temp Source: Oral (03/10 2012) BP: 148/67 mmHg (03/10 2000) Pulse Rate: 80 (03/10 2000)  Labs:  Recent Labs  02/08/16 1720 02/09/16 1947 02/10/16 0344 02/10/16 0915 02/10/16 1549 02/10/16 1959  HGB 8.0* 8.3* 10.7*  --   --   --   HCT 24.9* 25.6* 33.5*  --   --   --   PLT 461* 447* 385  --   --   --   LABPROT  --  17.1*  --   --   --   --   INR  --  1.39  --   --   --   --   HEPARINUNFRC  --   --   --  0.37  --  0.37  CREATININE 0.70 0.78 0.65  --  0.73  --     Estimated Creatinine Clearance: 62.7 mL/min (by C-G formula based on Cr of 0.73).   Medical History: Past Medical History  Diagnosis Date  . Hypertension   . Arthritis   . Depression   . Hyperlipidemia   . PFO (patent foramen ovale)     small  . H/O echocardiogram 2008    prolapse of post. mitral leaflet, tr MR, tr TR, EF >55%, mild concentric LVH  . H/O cardiovascular stress test 10/13/2007    low risk scan, no changes from previous test  . Heart palpitations     controlled on BB  . Hiatal hernia   . Cellulitis and abscess of toe of left foot 09/16/2014    Medications:  See electronic med rec  Assessment: 82 y.o. F presents from [facility] with flank pain x multiple months. CT scan positive for multiple subsegmental pulmonary emboli. No evidence of right heart strain. Currently on IV heparin at 1250 units/hr for PE. HL this PM is low therapeutic. Hgb low but stable on admission, plt elevated.   Goal of Therapy:   Heparin level 0.3-0.7 units/ml Monitor platelets by anticoagulation protocol: Yes   Plan:  Increase heparin gtt slightly to 1300 units/hr Will f/u confirmatory heparin level in 8 hours Daily heparin level and CBC [facility]   Thank you for allowing us to participate in this patients care. Signe Coltonya C Niamya Vittitow, PharmD Pager: [phone] 02/10/2016 8:38 PM

## 2016-02-10 NOTE — Progress Notes (Signed)
ANTICOAGULATION CONSULT NOTE - Initial Consult  Pharmacy Consult for Heparin Indication: pulmonary embolus  Allergies  Allergen Reactions  . Codeine Shortness Of Breath and Nausea And Vomiting  . Hydrocodone Shortness Of Breath and Nausea And Vomiting    Patient Measurements:    Ht 69.5 in  Wt: 81 kg  IBW: 67 kg Heparin Dosing Weight: 81 kg  Vital Signs: Temp: 98.1 F (36.7 C) (03/09 1737) Temp Source: Oral (03/09 1737) BP: 156/82 mmHg (03/09 2330) Pulse Rate: 102 (03/09 2330)  Labs:  Recent Labs  02/08/16 1720 02/09/16 1947  HGB 8.0* 8.3*  HCT 24.9* 25.6*  PLT 461* 447*  LABPROT  --  17.1*  INR  --  1.39  CREATININE 0.70 0.78    CrCl cannot be calculated (Unknown ideal weight.).   Medical History: Past Medical History  Diagnosis Date  . Hypertension   . Arthritis   . Depression   . Hyperlipidemia   . PFO (patent foramen ovale)     small  . H/O echocardiogram 2008    prolapse of post. mitral leaflet, tr MR, tr TR, EF >55%, mild concentric LVH  . H/O cardiovascular stress test 10/13/2007    low risk scan, no changes from previous test  . Heart palpitations     controlled on BB  . Hiatal hernia   . Cellulitis and abscess of toe of left foot 09/16/2014    Medications:  See electronic med rec  Assessment: 82 y.o. F presents from [facility] with flank pain x multiple months. CT scan positive for multiple subsegmental pulmonary emboli. No evidence of right heart strain. To begin heparin for PE. Hgb low but stable on admission, plt elevated.   Goal of Therapy:  Heparin level 0.3-0.7 units/ml Monitor platelets by anticoagulation protocol: Yes   Plan:  Heparin IV bolus 4000 units Heparin gtt at 1200 units/hr Will f/u heparin level in 8 hours Daily heparin level and CBC F[facility]  Christoper Fabianaron Ala Kratz, PharmD, BCPS Clinical pharmacist, pager [phone] 02/10/2016,12:51 AM

## 2016-02-10 NOTE — Progress Notes (Signed)
ANTICOAGULATION CONSULT NOTE - Follow Up Consult  Pharmacy Consult for Heparin Indication: pulmonary embolus  Allergies  Allergen Reactions  . Codeine Shortness Of Breath and Nausea And Vomiting  . Hydrocodone Shortness Of Breath and Nausea And Vomiting    Patient Measurements: Height: 5\' 8"  (172.7 cm) Weight: 179 lb 0.2 oz (81.2 kg) IBW/kg (Calculated) : 63.9  Ht 69.5 in  Wt: 81 kg  IBW: 67 kg Heparin Dosing Weight: 81 kg  Vital Signs: BP: 147/72 mmHg (03/10 1000) Pulse Rate: 88 (03/10 1000)  Labs:  Recent Labs  02/08/16 1720 02/09/16 1947 02/10/16 0344 02/10/16 0915  HGB 8.0* 8.3* 10.7*  --   HCT 24.9* 25.6* 33.5*  --   PLT 461* 447* 385  --   LABPROT  --  17.1*  --   --   INR  --  1.39  --   --   HEPARINUNFRC  --   --   --  0.37  CREATININE 0.70 0.78 0.65  --     Estimated Creatinine Clearance: 62.7 mL/min (by C-G formula based on Cr of 0.65).   Medical History: Past Medical History  Diagnosis Date  . Hypertension   . Arthritis   . Depression   . Hyperlipidemia   . PFO (patent foramen ovale)     small  . H/O echocardiogram 2008    prolapse of post. mitral leaflet, tr MR, tr TR, EF >55%, mild concentric LVH  . H/O cardiovascular stress test 10/13/2007    low risk scan, no changes from previous test  . Heart palpitations     controlled on BB  . Hiatal hernia   . Cellulitis and abscess of toe of left foot 09/16/2014    Medications:  See electronic med rec  Assessment: 82 y.o. F presents from [facility] with flank pain x multiple months. CT scan positive for multiple subsegmental pulmonary emboli. No evidence of right heart strain. Currently on IV heparin at 1200 units/hr for PE. HL this AM is low therapeutic. Hgb low but stable on admission, plt elevated.   Goal of Therapy:  Heparin level 0.3-0.7 units/ml Monitor platelets by anticoagulation protocol: Yes   Plan:  Increase heparin gtt slightly to 1250 units/hr Will f/u confirmatory heparin  level in 8 hours Daily heparin level and CBC [facility]  Vinnie LevelBenjamin Treyvion Durkee, PharmD., BCPS Clinical Pharmacist Pager [phone]

## 2016-02-10 NOTE — Progress Notes (Signed)
VASCULAR LAB PRELIMINARY  PRELIMINARY  PRELIMINARY  PRELIMINARY  Bilateral lower extremity venous duplex completed.     Left: DVT noted in posterior tibial and peroneal veins.   Right:  No evidence of DVT, superficial thrombosis, or Baker's cyst.  No evidence of superficial thrombosis.  No Baker's cyst.  Result to patient nurse@2 :45 pm.  Jenetta Logesami Markesia Crilly, RVT, RDMS 02/10/2016, 2:43 PM

## 2016-02-10 NOTE — Care Management Note (Signed)
Case Management Note  Patient Details  Name: Judieth KeensBetty R Cefalu MRN: [id number] Date of Birth: 07/26/1933  Subjective/Objective:    Pt admitted on 02/09/16 with acute PE.  [facility]                  Action/Plan: CSW consulted to facilitate return to SNF when medically stable.  Will follow progress.    Expected Discharge Date:                  Expected Discharge Plan:  Skilled Nursing Facility  In-House Referral:  Clinical Social Work  Discharge planning Services  CM Consult  Post Acute Care Choice:    Choice offered to:     DME Arranged:    DME Agency:     HH Arranged:    HH Agency:     Status of Service:  In process, will continue to follow  Medicare Important Message Given:    Date Medicare IM Given:    Medicare IM give by:    Date Additional Medicare IM Given:    Additional Medicare Important Message give by:     If discussed at Long Length of Stay Meetings, dates discussed:    Additional Comments:  Quintella BatonJulie W. Edwinna Rochette, RN, BSN  Trauma/Neuro ICU Case Manager [phone]

## 2016-02-11 DIAGNOSIS — T8389XA Other specified complication of genitourinary prosthetic devices, implants and grafts, initial encounter: Secondary | ICD-10-CM

## 2016-02-11 LAB — CBC
HEMATOCRIT: 23.2 % — AB (ref 36.0–46.0)
HEMATOCRIT: 23.8 % — AB (ref 36.0–46.0)
HEMOGLOBIN: 7.4 g/dL — AB (ref 12.0–15.0)
HEMOGLOBIN: 7.6 g/dL — AB (ref 12.0–15.0)
MCH: 28.1 pg (ref 26.0–34.0)
MCH: 28.3 pg (ref 26.0–34.0)
MCHC: 31.9 g/dL (ref 30.0–36.0)
MCHC: 31.9 g/dL (ref 30.0–36.0)
MCV: 88.2 fL (ref 78.0–100.0)
MCV: 88.5 fL (ref 78.0–100.0)
Platelets: 426 10*3/uL — ABNORMAL HIGH (ref 150–400)
Platelets: 463 10*3/uL — ABNORMAL HIGH (ref 150–400)
RBC: 2.63 MIL/uL — AB (ref 3.87–5.11)
RBC: 2.69 MIL/uL — ABNORMAL LOW (ref 3.87–5.11)
RDW: 16.4 % — ABNORMAL HIGH (ref 11.5–15.5)
RDW: 16.3 % — ABNORMAL HIGH (ref 11.5–15.5)
WBC: 9.7 10*3/uL (ref 4.0–10.5)
WBC: 10.8 10*3/uL — ABNORMAL HIGH (ref 4.0–10.5)

## 2016-02-11 LAB — IRON AND TIBC
IRON: 38 ug/dL (ref 28–170)
Saturation Ratios: 35 % — ABNORMAL HIGH (ref 10.4–31.8)
TIBC: 109 ug/dL — AB (ref 250–450)
UIBC: 71 ug/dL

## 2016-02-11 LAB — HEPARIN LEVEL (UNFRACTIONATED): Heparin Unfractionated: 0.56 IU/mL (ref 0.30–0.70)

## 2016-02-11 LAB — COMPREHENSIVE METABOLIC PANEL
ALBUMIN: 1.2 g/dL — AB (ref 3.5–5.0)
ALK PHOS: 67 U/L (ref 38–126)
ALT: 9 U/L — AB (ref 14–54)
ANION GAP: 8 (ref 5–15)
AST: 12 U/L — AB (ref 15–41)
BILIRUBIN TOTAL: 1.4 mg/dL — AB (ref 0.3–1.2)
BUN: <5 mg/dL — ABNORMAL LOW (ref 6–20)
CALCIUM: 8.1 mg/dL — AB (ref 8.9–10.3)
CHLORIDE: 103 mmol/L (ref 101–111)
CO2: 26 mmol/L (ref 22–32)
Creatinine, Ser: 0.71 mg/dL (ref 0.44–1.00)
Glucose, Bld: 101 mg/dL — ABNORMAL HIGH (ref 65–99)
Potassium: 3.1 mmol/L — ABNORMAL LOW (ref 3.5–5.1)
Sodium: 137 mmol/L (ref 135–145)
TOTAL PROTEIN: 5.4 g/dL — AB (ref 6.5–8.1)

## 2016-02-11 LAB — TSH: TSH: 1.864 u[IU]/mL (ref 0.350–4.500)

## 2016-02-11 LAB — RETICULOCYTES
RBC.: 2.69 MIL/uL — AB (ref 3.87–5.11)
RETIC CT PCT: 3.9 % — AB (ref 0.4–3.1)
Retic Count, Absolute: 104.9 10*3/uL (ref 19.0–186.0)

## 2016-02-11 LAB — FOLATE: FOLATE: 7.8 ng/mL (ref >5.9)

## 2016-02-11 LAB — MAGNESIUM: Magnesium: 1.7 mg/dL (ref 1.7–2.4)

## 2016-02-11 LAB — OCCULT BLOOD X 1 CARD TO LAB, STOOL: Fecal Occult Bld: NEGATIVE

## 2016-02-11 LAB — LACTATE DEHYDROGENASE: LDH: 200 U/L — ABNORMAL HIGH (ref 98–192)

## 2016-02-11 LAB — GLUCOSE, CAPILLARY: Glucose-Capillary: 99 mg/dL (ref 65–99)

## 2016-02-11 LAB — PREPARE RBC (CROSSMATCH)

## 2016-02-11 LAB — VITAMIN B12: VITAMIN B 12: 1100 pg/mL — AB (ref 180–914)

## 2016-02-11 MED ORDER — FLUCONAZOLE 200 MG PO TABS
200.0000 mg | ORAL_TABLET | Freq: Once | ORAL | Status: AC
  Administered 2016-02-11: 200 mg via ORAL
  Filled 2016-02-11: qty 1

## 2016-02-11 MED ORDER — FUROSEMIDE 10 MG/ML IJ SOLN
40.0000 mg | INTRAMUSCULAR | Status: AC
  Filled 2016-02-11: qty 4

## 2016-02-11 MED ORDER — MAGNESIUM SULFATE 2 GM/50ML IV SOLN
2.0000 g | Freq: Once | INTRAVENOUS | Status: AC
  Administered 2016-02-11: 2 g via INTRAVENOUS
  Filled 2016-02-11: qty 50

## 2016-02-11 MED ORDER — POTASSIUM CHLORIDE CRYS ER 20 MEQ PO TBCR
40.0000 meq | EXTENDED_RELEASE_TABLET | Freq: Once | ORAL | Status: AC
  Administered 2016-02-11: 40 meq via ORAL
  Filled 2016-02-11: qty 2

## 2016-02-11 MED ORDER — SODIUM CHLORIDE 0.9 % IV SOLN: Freq: Once | INTRAVENOUS | Status: DC

## 2016-02-11 NOTE — Progress Notes (Signed)
ANTICOAGULATION CONSULT NOTE - Follow Up Consult  Pharmacy Consult for Heparin Indication: pulmonary embolus  Allergies  Allergen Reactions  . Codeine Shortness Of Breath and Nausea And Vomiting  . Hydrocodone Shortness Of Breath and Nausea And Vomiting    Patient Measurements: Height: 5\' 8"  (172.7 cm) Weight: 179 lb 0.2 oz (81.2 kg) IBW/kg (Calculated) : 63.9  Ht 69.5 in  Wt: 81 kg  IBW: 67 kg Heparin Dosing Weight: 81 kg  Vital Signs: Temp: 98.3 F (36.8 C) (03/11 0800) Temp Source: Oral (03/11 0800) BP: 147/74 mmHg (03/11 1000) Pulse Rate: 87 (03/11 1000)  Labs:  Recent Labs  02/09/16 1947 02/10/16 0344 02/10/16 0915 02/10/16 1549 02/10/16 1959 02/11/16 0355 02/11/16 0406  HGB 8.3* 10.7*  --   --   --  7.4*  --   HCT 25.6* 33.5*  --   --   --  23.2*  --   PLT 447* 385  --   --   --  426*  --   LABPROT 17.1*  --   --   --   --   --   --   INR 1.39  --   --   --   --   --   --   HEPARINUNFRC  --   --  0.37  --  0.37  --  0.56  CREATININE 0.78 0.65  --  0.73  --  0.71  --     Estimated Creatinine Clearance: 62.7 mL/min (by C-G formula based on Cr of 0.71).   Medical History: Past Medical History  Diagnosis Date  . Hypertension   . Arthritis   . Depression   . Hyperlipidemia   . PFO (patent foramen ovale)     small  . H/O echocardiogram 2008    prolapse of post. mitral leaflet, tr MR, tr TR, EF >55%, mild concentric LVH  . H/O cardiovascular stress test 10/13/2007    low risk scan, no changes from previous test  . Heart palpitations     controlled on BB  . Hiatal hernia   . Cellulitis and abscess of toe of left foot 09/16/2014    Medications:  See electronic med rec  Assessment: 82 y.o. F presents from [facility] with flank pain x multiple months. CT scan positive for multiple subsegmental pulmonary emboli. No evidence of right heart strain. Currently on IV heparin at 1300 units/hr for PE. Drop in hgb, but hgb historically appears to fluctuate,  plts stable.   Goal of Therapy:  Heparin level 0.3-0.7 units/ml Monitor platelets by anticoagulation protocol: Yes   Plan:  Continue heparin gtt at 1300 units/hr Daily heparin level and CBC [facility]   Thank you for allowing us to participate in this patients care.    Agapito GamesAlison Cap Massi, PharmD, BCPS Clinical Pharmacist Pager: [phone] 02/11/2016 11:49 AM

## 2016-02-11 NOTE — Progress Notes (Signed)
PROGRESS NOTE  Cassandra [name] DOB: 10/14/1933 DOA: 02/09/2016 PCP: Cassandra [name]  HPI/Recap of past 24 hours:  On heparin drip, some mild pleuritic chest pain, on room air, denies sob, very pleasant Indwelling foley with clear urine. Daughter in room  Assessment/Plan: Principal Problem:   Acute pulmonary embolism (HCC) Active Problems:   Hypertension   Hyperlipidemia   PFO (patent foramen ovale)   Cerebral embolism with cerebral infarction   Bacterial endocarditis   Chronic osteomyelitis of left foot (HCC)   S/P amputation of foot (HCC)   GERD (gastroesophageal reflux disease)   PE (pulmonary embolism)  Acute PE and left lower extremity  DVT: continue heparin drip, tachycardia has resolved, now hemodynamically stable, repeat echo right ventricle systolic function wnl, lvef 65-75%, NO WALL MOTION abnormalities. No obvious vegetation.  Normocytic Anemia: hgb has been fluctuating ranging from 7.6 to 10 since 01/2016, she received two prbc during last hospitalization. check stool occult blood, urine no gross hematuria, no obvious bleeding, bp stable, denies ab pain or back pain, anemia work up. Transfuse 1prbc on 3/11.   Pleural effusion: small right ? Left, from PE? Also has low albumin, anasarca, Does has grade i diastolic dysfunction on echo, mild pitting edema on right leg, more edema on left leg (+DVt left leg), on room air,  Lasix 40mg  after prbc x1 unit of 3/11.  H/o Urinary retention, CT scan from 01/10/2016 showing bilateral hydronephrosis without evidence of distal obstruction. -A Foley catheter was placed from last hospitalization, due to failure of voiding trial, [facility], patient reported she was evaluated by urology Cassandra [name] this Monday, she is advised to continue foley.   ua from 3/10 on presentation during this hospitalization + bacteriuria , + yeast, culture pending, no fever, no leukocytosis, will wait for  culture for now.  Yeast in the urine: start diflucan  Endocarditis: Evidenced by blood cultures from 12/31/2015 growing MSSA. She is currently on Nafcillin 2 g IV every 4 hours per Cassandra [name]'s recommendation, supposed to be treated until 02/20/16. Source of bacteremia felt to be left foot osteomyelitis. Pt is s/p of transmetatarsal amputation of left foot osteo.Repeat blood cultures from 01/06/2016 showing no growth to date. Per discharge summary, CVTS will reassess after antibiotics therapy completed to decide at that time needs for surgery/valvular repair.  -continue IV nafcillin -Repeat blood culture no growth so far  Chronic left foot wound/osteomyelitis . MRI of foot from 01/03/2016 reported by radiology to show possible underlying osteomyelitis, cannot rule out underlying abscess. She underwent transmetatarsal amputation of left foot for chronic osteomyelitis.  -continue IV nafcillin -outpatient follow up with orthopedic service as an outpatient   Splenic infarct. Patient complaining of abdominal pain on 01/09/2016, further worked up with CT scan of abdomen and pelvis with IV contrast, study revealed splenic infarct involving 30%, likely secondary to septic emboli.  -Continue IV antibiotics as above   HTN; -Atenolol, losartan,  HLD: Last LDL was 71 on 01/06/16 -Continue home medications: Zocor  Depression and anxiety: Stable, no suicidal or homicidal ideations. -Continue home medications  DVT ppx: on IV heparin   Code Status: full  Family Communication: patient and daughter in room.  Disposition Plan: remain in stepdown on heparin drip, consider transition to NOAC in the next 24hrs if continue to be stable   Consultants:  pccm phone conversation by admitting [name] Cassandra [name]  Procedures:  none  Antibiotics:  nafacillin   Objective: BP 147/74  mmHg  Pulse 87  Temp(Src) 98.8 F (37.1 C) (Oral)  Resp 18  Ht  (1.727 m)  Wt 81.2 kg (179 lb 0.2 oz)  BMI 27.23  kg/m2  SpO2 95%  Intake/Output Summary (Last 24 hours) at 02/11/16 1324 Last data filed at 02/11/16 1100  Gross per 24 hour  Intake 781.68 ml  Output   1175 ml  Net -393.32 ml   Filed Weights   02/10/16 0522  Weight: 81.2 kg (179 lb 0.2 oz)    Exam:   General:  NAD  Cardiovascular: RRR  Respiratory: CTABL  Abdomen: Soft/ND/NT, positive BS, indwelling foley   Musculoskeletal: left foot post op changes /dressing intact, bilateral lower extremity edema, left >right.  Neuro: aaox3, subtle right hand weakness which is improving per patient  Data Reviewed: Basic Metabolic Panel:  Recent Labs Lab 02/08/16 1720 02/09/16 1947 02/10/16 0344 02/10/16 1549 02/11/16 0355  NA 133* 138 136 136 137  K 3.8 3.4* 3.5 3.1* 3.1*  CL 99 102 102 101 103  CO2 26 25 21* 24 26  GLUCOSE 104* 99 133* 104* 101*  BUN 7 6 <5* <5* <5*  CREATININE 0.70 0.78 0.65 0.73 0.71  CALCIUM 8.1* 8.5* 8.2* 8.4* 8.1*  MG  --   --  1.8  --  1.7   Liver Function Tests:  Recent Labs Lab 02/09/16 1947 02/10/16 1549 02/11/16 0355  AST 12* 13* 12*  ALT 7* 7* 9*  ALKPHOS 79 72 67  BILITOT 1.4* 1.5* 1.4*  PROT 6.3* 5.7* 5.4*  ALBUMIN 1.5* 1.4* 1.2*   No results for input(s): LIPASE, AMYLASE in the last 168 hours. No results for input(s): AMMONIA in the last 168 hours. CBC:  Recent Labs Lab 02/08/16 1720 02/09/16 1947 02/10/16 0344 02/11/16 0355  WBC 12.8* 11.6* 10.1 9.7  NEUTROABS 8.7* 7.4  --   --   HGB 8.0* 8.3* 10.7* 7.4*  HCT 24.9* 25.6* 33.5* 23.2*  MCV 88.9 88.0 87.2 88.2  PLT 461* 447* 385 426*   Cardiac Enzymes:   No results for input(s): CKTOTAL, CKMB, CKMBINDEX, TROPONINI in the last 168 hours. BNP (last 3 results)  Recent Labs  01/01/16 1142  BNP 432.0*    ProBNP (last 3 results) No results for input(s): PROBNP in the last 8760 hours.  CBG:  Recent Labs Lab 02/10/16 0722 02/11/16 0731  GLUCAP 109* 99    Recent Results (from the past 240 hour(s))  Culture,  blood (Routine X 2) w Reflex to ID Panel     Status: None (Preliminary result)   Collection Time: 02/09/16  7:37 PM  Result Value Ref Range Status   Specimen Description BLOOD LEFT ANTECUBITAL  Final   Special Requests IN PEDIATRIC BOTTLE 1CC  Final   Culture NO GROWTH < 24 HOURS  Final   Report Status PENDING  Incomplete  Culture, blood (Routine X 2) w Reflex to ID Panel     Status: None (Preliminary result)   Collection Time: 02/09/16  7:47 PM  Result Value Ref Range Status   Specimen Description BLOOD RIGHT HAND  Final   Special Requests IN PEDIATRIC BOTTLE 1CC  Final   Culture NO GROWTH < 24 HOURS  Final   Report Status PENDING  Incomplete  MRSA PCR Screening     Status: None   Collection Time: 02/10/16  5:10 AM  Result Value Ref Range Status   MRSA by PCR NEGATIVE NEGATIVE Final    Comment:  The GeneXpert MRSA Assay (FDA approved for NASAL specimens only), is one component of a comprehensive MRSA colonization surveillance program. It is not intended to diagnose MRSA infection nor to guide or monitor treatment for MRSA infections.      Studies: No results found.  Scheduled Meds: . aspirin  81 mg Oral Daily  . atenolol  100 mg Oral Daily  . brimonidine  1 drop Both Eyes TID  . brinzolamide  1 drop Both Eyes TID  . calcium-vitamin D  1 tablet Oral Daily  . famotidine  40 mg Oral Daily  . feeding supplement (ENSURE ENLIVE)  237 mL Oral BID BM  . isosorbide mononitrate  30 mg Oral Daily  . latanoprost  1 drop Both Eyes QHS  . losartan  100 mg Oral Daily  . magnesium sulfate 1 - 4 g bolus IVPB  2 g Intravenous Once  . multivitamin  1 tablet Oral BID  . nafcillin IVPB 2 gram/100 mL D5W (Pyxis)  2 g Intravenous Q4H  . nystatin  1 Bottle Topical BID  . pantoprazole  40 mg Oral BID  . potassium chloride  40 mEq Oral Once  . saccharomyces boulardii  250 mg Oral BID  . sertraline  100 mg Oral Daily  . simvastatin  10 mg Oral QHS  . sodium chloride flush  3 mL  Intravenous Q12H  . traMADol  50 mg Oral TID    Continuous Infusions: . heparin 1,300 Units/hr (02/10/16 2139)     Time spent:  Marcee Jacobs MD, PhD  [facility] Pager [phone]. If 7PM-7AM, please contact night-coverage at [email or web address], password [email or web address] 02/11/2016, 1:24 PM  LOS: 1 day

## 2016-02-12 ENCOUNTER — Inpatient Hospital Stay (HOSPITAL_COMMUNITY): Payer: Medicare Other

## 2016-02-12 DIAGNOSIS — B49 Unspecified mycosis: Secondary | ICD-10-CM

## 2016-02-12 DIAGNOSIS — N390 Urinary tract infection, site not specified: Secondary | ICD-10-CM

## 2016-02-12 DIAGNOSIS — B9620 Unspecified Escherichia coli [E. coli] as the cause of diseases classified elsewhere: Secondary | ICD-10-CM

## 2016-02-12 DIAGNOSIS — B9689 Other specified bacterial agents as the cause of diseases classified elsewhere: Secondary | ICD-10-CM

## 2016-02-12 DIAGNOSIS — D638 Anemia in other chronic diseases classified elsewhere: Secondary | ICD-10-CM

## 2016-02-12 DIAGNOSIS — E876 Hypokalemia: Secondary | ICD-10-CM

## 2016-02-12 DIAGNOSIS — I82409 Acute embolism and thrombosis of unspecified deep veins of unspecified lower extremity: Secondary | ICD-10-CM

## 2016-02-12 DIAGNOSIS — Z8619 Personal history of other infectious and parasitic diseases: Secondary | ICD-10-CM

## 2016-02-12 LAB — URINE CULTURE: Special Requests: NORMAL

## 2016-02-12 LAB — CBC
HCT: 26.1 % — ABNORMAL LOW (ref 36.0–46.0)
HEMOGLOBIN: 8.4 g/dL — AB (ref 12.0–15.0)
MCH: 28.2 pg (ref 26.0–34.0)
MCHC: 32.2 g/dL (ref 30.0–36.0)
MCV: 87.6 fL (ref 78.0–100.0)
PLATELETS: 396 10*3/uL (ref 150–400)
RBC: 2.98 MIL/uL — AB (ref 3.87–5.11)
RDW: 15.7 % — ABNORMAL HIGH (ref 11.5–15.5)
WBC: 8.4 10*3/uL (ref 4.0–10.5)

## 2016-02-12 LAB — COMPREHENSIVE METABOLIC PANEL
ALBUMIN: 1.3 g/dL — AB (ref 3.5–5.0)
ALT: 8 U/L — ABNORMAL LOW (ref 14–54)
ANION GAP: 12 (ref 5–15)
AST: 12 U/L — ABNORMAL LOW (ref 15–41)
Alkaline Phosphatase: 67 U/L (ref 38–126)
BUN: <5 mg/dL — ABNORMAL LOW (ref 6–20)
CHLORIDE: 100 mmol/L — AB (ref 101–111)
CO2: 24 mmol/L (ref 22–32)
Calcium: 8 mg/dL — ABNORMAL LOW (ref 8.9–10.3)
Creatinine, Ser: 0.77 mg/dL (ref 0.44–1.00)
GFR calc non Af Amer: >60 mL/min (ref >60)
Glucose, Bld: 119 mg/dL — ABNORMAL HIGH (ref 65–99)
Potassium: 3.3 mmol/L — ABNORMAL LOW (ref 3.5–5.1)
SODIUM: 136 mmol/L (ref 135–145)
Total Bilirubin: 1.7 mg/dL — ABNORMAL HIGH (ref 0.3–1.2)
Total Protein: 5.4 g/dL — ABNORMAL LOW (ref 6.5–8.1)

## 2016-02-12 LAB — HEPARIN LEVEL (UNFRACTIONATED): HEPARIN UNFRACTIONATED: 0.38 [IU]/mL (ref 0.30–0.70)

## 2016-02-12 LAB — MAGNESIUM: Magnesium: 2.1 mg/dL (ref 1.7–2.4)

## 2016-02-12 LAB — GLUCOSE, CAPILLARY: GLUCOSE-CAPILLARY: 81 mg/dL (ref 65–99)

## 2016-02-12 LAB — HAPTOGLOBIN: HAPTOGLOBIN: 155 mg/dL (ref 34–200)

## 2016-02-12 MED ORDER — FUROSEMIDE 10 MG/ML IJ SOLN
40.0000 mg | Freq: Once | INTRAMUSCULAR | Status: AC
  Administered 2016-02-12: 40 mg via INTRAVENOUS
  Filled 2016-02-12: qty 4

## 2016-02-12 MED ORDER — DEXTROSE 5 % IV SOLN
1.0000 g | INTRAVENOUS | Status: DC
  Administered 2016-02-12 – 2016-02-13 (×2): 1 g via INTRAVENOUS
  Filled 2016-02-12 (×2): qty 10

## 2016-02-12 MED ORDER — POTASSIUM CHLORIDE CRYS ER 20 MEQ PO TBCR
40.0000 meq | EXTENDED_RELEASE_TABLET | Freq: Once | ORAL | Status: AC
  Administered 2016-02-12: 40 meq via ORAL
  Filled 2016-02-12: qty 2

## 2016-02-12 MED ORDER — RIFAMPIN 300 MG PO CAPS
300.0000 mg | ORAL_CAPSULE | Freq: Two times a day (BID) | ORAL | Status: DC
  Administered 2016-02-12 – 2016-02-14 (×4): 300 mg via ORAL
  Filled 2016-02-12 (×4): qty 1

## 2016-02-12 MED ORDER — FUROSEMIDE 10 MG/ML IJ SOLN
40.0000 mg | Freq: Every day | INTRAMUSCULAR | Status: DC
  Administered 2016-02-12 – 2016-02-16 (×5): 40 mg via INTRAVENOUS
  Filled 2016-02-12 (×4): qty 4

## 2016-02-12 MED ORDER — GADOBENATE DIMEGLUMINE 529 MG/ML IV SOLN
17.0000 mL | Freq: Once | INTRAVENOUS | Status: AC | PRN
  Administered 2016-02-12: 17 mL via INTRAVENOUS

## 2016-02-12 NOTE — Evaluation (Signed)
Occupational Therapy Evaluation Patient Details Name: Cassandra [name] MRN: [id number] DOB: 08/07/1933 Today's Date: 02/12/2016    History of Present Illness pt presents with R PE, R Pleural Effusion, Endocarditis, and Splenic Infarct.  Pt with recent hx of L Transmetarsal Amputation and PMHx of HTN, PFO, Anxiety, and Depression.     Clinical Impression   This 82 yo female admitted with above presents to acute OT with deficits below affecting her ability to care for herself at an independent level she as at prior to Dec 29, 2015. She will benefit from acute OT with follow up at SNF to get to a Mod I to Independent level to return home.    Follow Up Recommendations  SNF    Equipment Recommendations   (TBD at next venue)       Precautions / Restrictions Precautions Precautions: Fall Required Braces or Orthoses: Other Brace/Splint Other Brace/Splint: Post-op boot for L foot Restrictions Weight Bearing Restrictions: Yes LLE Weight Bearing: Partial weight bearing LLE Partial Weight Bearing Percentage or Pounds: per grand-daughter [facility]. She reports the instructions said PWB but did not give a %      Mobility Bed Mobility Overal bed mobility: Needs Assistance Bed Mobility: Supine to Sit     Supine to sit: Min guard     General bed mobility comments: pt needs increased time to scoot hips to EOB; HOB raised  Transfers Overall transfer level: Needs assistance Equipment used: Rolling walker (2 wheeled) Transfers: Sit to/from [employer]Stand;Stand Pivot Transfers;Squat Pivot Transfers Sit to Stand: Mod assist;+2 physical assistance;From elevated surface Stand pivot transfers: Mod assist;+2 physical assistance Squat pivot transfers: Mod assist;+2 physical assistance          Balance Overall balance assessment: Needs assistance Sitting-balance support: Feet supported;No upper extremity supported Sitting balance-Leahy Scale: Fair     Standing balance  support: Bilateral upper extremity supported;During functional activity Standing balance-Leahy Scale: Poor Standing balance comment: reliant on RW and or A from therapist                            ADL Overall ADL's : Needs assistance/impaired Eating/Feeding: Independent;Sitting   Grooming: Set up;Sitting   Upper Body Bathing: Set up;Sitting   Lower Body Bathing: Moderate assistance (Mod A+2 sit<>stand)   Upper Body Dressing : Set up;Sitting   Lower Body Dressing: Maximal assistance (Mod A+2 sit<>stand)   Toilet Transfer: Moderate assistance;+2 for physical assistance;Stand-pivot;Squat-pivot;BSC;RW   Toileting- Clothing Manipulation and Hygiene: Total assistance (Mod A+2 sit<>stand)                         Pertinent Vitals/Pain Pain Assessment: Faces Faces Pain Scale: Hurts little more Pain Location: back Pain Descriptors / Indicators: Aching;Sore Pain Intervention(s): Monitored during session;Repositioned     Hand Dominance  right   Extremity/Trunk Assessment Upper Extremity Assessment Upper Extremity Assessment: Generalized weakness (for use with RW and PWB on LLE)           Communication  No issues   Cognition Arousal/Alertness: Awake/alert Behavior During Therapy: WFL for tasks assessed/performed Overall Cognitive Status: Within Functional Limits for tasks assessed                                Home Living Family/patient expects to be discharged to:: Skilled nursing facility  OT Diagnosis: Generalized weakness;Acute pain   OT Problem List: Decreased strength;Decreased activity tolerance;Impaired balance (sitting and/or standing);Pain;Decreased knowledge of use of DME or AE   OT Treatment/Interventions: Self-care/ADL training;Patient/family education;Balance training;Therapeutic activities;DME and/or AE instruction;Therapeutic exercise    OT Goals(Current  goals can be found in the care plan section) Acute Rehab OT Goals Patient Stated Goal: Walk again. OT Goal Formulation: With patient Time For Goal Achievement: 02/26/16 Potential to Achieve Goals: Good  OT Frequency: Min 2X/week   Barriers to D/C: Decreased caregiver support             End of Session Equipment Utilized During Treatment: Gait belt;Rolling walker Nurse Communication: Mobility status (grand-daughter asking about dressing change for LLE)  Activity Tolerance: Patient tolerated treatment well Patient left: in chair;with call bell/phone within reach;with chair alarm set;with family/visitor present   Time: 0922-0950 OT Time Calculation (min): 28 min Charges:  OT General Charges $OT Visit: 1 Procedure OT Evaluation $OT Eval Moderate Complexity: 1 Procedure OT Treatments $Self Care/Home Management : 8-22 mins  Evette Georges [phone] 02/12/2016, 10:00 AM

## 2016-02-12 NOTE — Consult Note (Signed)
Reason for Consult: Lumbar discitis epidural abscess osteomyelitis Referring Physician: Bonne Dolores hospitalist  Cassandra [name] is an 82 y.o. female.  HPI: Patient is an 82 year old female who was diagnosed back in January with endocarditis was placed on nafcillin since the diagnosis and upon during the course of her treatment she's also been noted to have posterior left foot status post amputation multiple septic emboli to her brain as well as during this hospitalization they've diagnosed lumbar spinal discitis osteomyelitis epidural abscess. Patient complains of chronic back pain is been going on for 3 or 4 months she denies any true radicular component to the pain she denies any numbness and tingling or legs or feet she and the family both reports significant improvement over the last week with her ability to tolerate the pain stand independently and ambulate a few steps that has gotten better. Patient states she does have good days and bad days some positions are worse than others and she is managing the pain with tramadol.  Past Medical History  Diagnosis Date  . Hypertension   . Arthritis   . Depression   . Hyperlipidemia   . PFO (patent foramen ovale)     small  . H/O echocardiogram 2008    prolapse of post. mitral leaflet, tr MR, tr TR, EF >55%, mild concentric LVH  . H/O cardiovascular stress test 10/13/2007    low risk scan, no changes from previous test  . Heart palpitations     controlled on BB  . Hiatal hernia   . Cellulitis and abscess of toe of left foot 09/16/2014    Past Surgical History  Procedure Laterality Date  . Cholecystectomy    . Cardiac catheterization  10/2002    normal coronary arteries, normal LV function  . Incision and drainage Right 07/21/2014    Procedure: INCISION AND DRAINAGE;  Surgeon: Marcheta Grammes, DPM;  Location: [facility];  Service: Podiatry;  Laterality: Right;  . Incision and drainage Left 09/17/2014    Procedure: INCISION AND DRAINAGE 1ST  TOE LEFT FOOT;  Surgeon: Marcheta Grammes, DPM;  Location: [facility];  Service: Podiatry;  Laterality: Left;  . Bone biopsy Left 09/17/2014    Procedure: BONE BIOPSY;  Surgeon: Marcheta Grammes, DPM;  Location: [facility];  Service: Podiatry;  Laterality: Left;  . Amputation Left 09/20/2014    Procedure: AMPUTATION DIGIT (LEFT GREAT TOE);  Surgeon: Marcheta Grammes, DPM;  Location: [facility];  Service: Podiatry;  Laterality: Left;  . Tee without cardioversion N/A 01/05/2016    Procedure: TRANSESOPHAGEAL ECHOCARDIOGRAM (TEE);  Surgeon: Sanda Klein, MD;  Location: [facility]COPY;  Service: Cardiovascular;  Laterality: N/A;  . Amputation Left 01/08/2016    Procedure: AMPUTATION TRANSMETATARSAL LEFT FOOT and achilles tendon lengthening;  Surgeon: Wylene Simmer, MD;  Location: [place];  Service: Orthopedics;  Laterality: Left;    Family History  Problem Relation Age of Onset  . Heart disease Mother   . Cancer Father   . Kidney disease Brother   . Hypertension Daughter   . Hyperlipidemia Son     Social History:  reports that she has never smoked. She has never used smokeless tobacco. She reports that she does not drink alcohol or use illicit drugs.  Allergies:  Allergies  Allergen Reactions  . Codeine Shortness Of Breath and Nausea And Vomiting  . Hydrocodone Shortness Of Breath and Nausea And Vomiting    Medications: I have reviewed the patient's current medications.  Results for orders placed or performed during  the hospital encounter of 02/09/16 (from the past 48 hour(s))  Heparin level (unfractionated)     Status: None   Collection Time: 02/10/16  7:59 PM  Result Value Ref Range   Heparin Unfractionated 0.37 0.30 - 0.70 IU/mL    Comment:        IF HEPARIN RESULTS ARE BELOW EXPECTED VALUES, AND PATIENT DOSAGE HAS BEEN CONFIRMED, SUGGEST FOLLOW UP TESTING OF ANTITHROMBIN III LEVELS.   CBC     Status: Abnormal   Collection Time: 02/11/16  3:55 AM  Result Value Ref Range    WBC 9.7 4.0 - 10.5 K/uL   RBC 2.63 (L) 3.87 - 5.11 MIL/uL   Hemoglobin 7.4 (L) 12.0 - 15.0 g/dL    Comment: REPEATED TO VERIFY   HCT 23.2 (L) 36.0 - 46.0 %   MCV 88.2 78.0 - 100.0 fL   MCH 28.1 26.0 - 34.0 pg   MCHC 31.9 30.0 - 36.0 g/dL   RDW 16.4 (H) 11.5 - 15.5 %   Platelets 426 (H) 150 - 400 K/uL  Magnesium     Status: None   Collection Time: 02/11/16  3:55 AM  Result Value Ref Range   Magnesium 1.7 1.7 - 2.4 mg/dL  Comprehensive metabolic panel     Status: Abnormal   Collection Time: 02/11/16  3:55 AM  Result Value Ref Range   Sodium 137 135 - 145 mmol/L   Potassium 3.1 (L) 3.5 - 5.1 mmol/L   Chloride 103 101 - 111 mmol/L   CO2 26 22 - 32 mmol/L   Glucose, Bld 101 (H) 65 - 99 mg/dL   BUN <5 (L) 6 - 20 mg/dL   Creatinine, Ser 0.71 0.44 - 1.00 mg/dL   Calcium 8.1 (L) 8.9 - 10.3 mg/dL   Total Protein 5.4 (L) 6.5 - 8.1 g/dL   Albumin 1.2 (L) 3.5 - 5.0 g/dL   AST 12 (L) 15 - 41 U/L   ALT 9 (L) 14 - 54 U/L   Alkaline Phosphatase 67 38 - 126 U/L   Total Bilirubin 1.4 (H) 0.3 - 1.2 mg/dL   GFR calc non Af Amer >60 >60 mL/min   GFR calc Af Amer >60 >60 mL/min    Comment: (NOTE) The eGFR has been calculated using the CKD EPI equation. This calculation has not been validated in all clinical situations. eGFR's persistently <60 mL/min signify possible Chronic Kidney Disease.    Anion gap 8 5 - 15  Heparin level (unfractionated)     Status: None   Collection Time: 02/11/16  4:06 AM  Result Value Ref Range   Heparin Unfractionated 0.56 0.30 - 0.70 IU/mL    Comment:        IF HEPARIN RESULTS ARE BELOW EXPECTED VALUES, AND PATIENT DOSAGE HAS BEEN CONFIRMED, SUGGEST FOLLOW UP TESTING OF ANTITHROMBIN III LEVELS.   TSH     Status: None   Collection Time: 02/11/16  4:07 AM  Result Value Ref Range   TSH 1.864 0.350 - 4.500 uIU/mL  Glucose, capillary     Status: None   Collection Time: 02/11/16  7:31 AM  Result Value Ref Range   Glucose-Capillary 99 65 - 99 mg/dL  CBC      Status: Abnormal   Collection Time: 02/11/16  2:13 PM  Result Value Ref Range   WBC 10.8 (H) 4.0 - 10.5 K/uL   RBC 2.69 (L) 3.87 - 5.11 MIL/uL   Hemoglobin 7.6 (L) 12.0 - 15.0 g/dL   HCT 23.8 (L) 36.0 -  46.0 %   MCV 88.5 78.0 - 100.0 fL   MCH 28.3 26.0 - 34.0 pg   MCHC 31.9 30.0 - 36.0 g/dL   RDW 16.3 (H) 11.5 - 15.5 %   Platelets 463 (H) 150 - 400 K/uL  Lactate dehydrogenase     Status: Abnormal   Collection Time: 02/11/16  2:13 PM  Result Value Ref Range   LDH 200 (H) 98 - 192 U/L  Haptoglobin     Status: None   Collection Time: 02/11/16  2:13 PM  Result Value Ref Range   Haptoglobin 155 34 - 200 mg/dL    Comment: (NOTE) Performed At: [facility] [place], [place] [id number] Lindon Romp MD EY:[id number]   Reticulocytes     Status: Abnormal   Collection Time: 02/11/16  2:13 PM  Result Value Ref Range   Retic Ct Pct 3.9 (H) 0.4 - 3.1 %   RBC. 2.69 (L) 3.87 - 5.11 MIL/uL   Retic Count, Manual 104.9 19.0 - 186.0 K/uL  Iron and TIBC     Status: Abnormal   Collection Time: 02/11/16  2:13 PM  Result Value Ref Range   Iron 38 28 - 170 ug/dL   TIBC 109 (L) 250 - 450 ug/dL   Saturation Ratios 35 (H) 10.4 - 31.8 %   UIBC 71 ug/dL  Vitamin B12     Status: Abnormal   Collection Time: 02/11/16  2:13 PM  Result Value Ref Range   Vitamin B-12 1100 (H) 180 - 914 pg/mL    Comment: (NOTE) This assay is not validated for testing neonatal or myeloproliferative syndrome specimens for Vitamin B12 levels.   Folate     Status: None   Collection Time: 02/11/16  2:15 PM  Result Value Ref Range   Folate 7.8 >5.9 ng/mL  Type and screen [place]     Status: None (Preliminary result)   Collection Time: 02/11/16  2:30 PM  Result Value Ref Range   ABO/RH(D) O POS    Antibody Screen POS    Sample Expiration 02/14/2016    Antibody Identification NO CLINICALLY SIGNIFICANT ANTIBODY IDENTIFIED    DAT, IgG POS    Unit Number [id number]    Blood  Component Type RED CELLS,LR    Unit division 00    Status of Unit ISSUED,FINAL    Transfusion Status OK TO TRANSFUSE    Crossmatch Result COMPATIBLE    Unit Number [id number]    Blood Component Type RED CELLS,LR    Unit division 00    Status of Unit ALLOCATED    Transfusion Status OK TO TRANSFUSE    Crossmatch Result COMPATIBLE   Occult blood card to lab, stool RN will collect     Status: None   Collection Time: 02/11/16  2:58 PM  Result Value Ref Range   Fecal Occult Bld NEGATIVE NEGATIVE  Prepare RBC     Status: None   Collection Time: 02/11/16  4:30 PM  Result Value Ref Range   Order Confirmation ORDER PROCESSED BY BLOOD BANK   Heparin level (unfractionated)     Status: None   Collection Time: 02/12/16  4:20 AM  Result Value Ref Range   Heparin Unfractionated 0.38 0.30 - 0.70 IU/mL    Comment:        IF HEPARIN RESULTS ARE BELOW EXPECTED VALUES, AND PATIENT DOSAGE HAS BEEN CONFIRMED, SUGGEST FOLLOW UP TESTING OF ANTITHROMBIN III LEVELS.   CBC     Status: Abnormal  Collection Time: 02/12/16  4:20 AM  Result Value Ref Range   WBC 8.4 4.0 - 10.5 K/uL   RBC 2.98 (L) 3.87 - 5.11 MIL/uL   Hemoglobin 8.4 (L) 12.0 - 15.0 g/dL   HCT 26.1 (L) 36.0 - 46.0 %   MCV 87.6 78.0 - 100.0 fL   MCH 28.2 26.0 - 34.0 pg   MCHC 32.2 30.0 - 36.0 g/dL   RDW 15.7 (H) 11.5 - 15.5 %   Platelets 396 150 - 400 K/uL  Comprehensive metabolic panel     Status: Abnormal   Collection Time: 02/12/16  4:20 AM  Result Value Ref Range   Sodium 136 135 - 145 mmol/L   Potassium 3.3 (L) 3.5 - 5.1 mmol/L   Chloride 100 (L) 101 - 111 mmol/L   CO2 24 22 - 32 mmol/L   Glucose, Bld 119 (H) 65 - 99 mg/dL   BUN <5 (L) 6 - 20 mg/dL   Creatinine, Ser 0.77 0.44 - 1.00 mg/dL   Calcium 8.0 (L) 8.9 - 10.3 mg/dL   Total Protein 5.4 (L) 6.5 - 8.1 g/dL   Albumin 1.3 (L) 3.5 - 5.0 g/dL   AST 12 (L) 15 - 41 U/L   ALT 8 (L) 14 - 54 U/L   Alkaline Phosphatase 67 38 - 126 U/L   Total Bilirubin 1.7 (H) 0.3 - 1.2  mg/dL   GFR calc non Af Amer >60 >60 mL/min   GFR calc Af Amer >60 >60 mL/min    Comment: (NOTE) The eGFR has been calculated using the CKD EPI equation. This calculation has not been validated in all clinical situations. eGFR's persistently <60 mL/min signify possible Chronic Kidney Disease.    Anion gap 12 5 - 15  Magnesium     Status: None   Collection Time: 02/12/16  4:20 AM  Result Value Ref Range   Magnesium 2.1 1.7 - 2.4 mg/dL  Glucose, capillary     Status: None   Collection Time: 02/12/16  7:43 AM  Result Value Ref Range   Glucose-Capillary 81 65 - 99 mg/dL    Mr Lumbar Spine W Wo Contrast  02/12/2016  CLINICAL DATA:  Endocarditis.  Follow up discitis. EXAM: MRI LUMBAR SPINE WITHOUT AND WITH CONTRAST TECHNIQUE: Multiplanar and multiecho pulse sequences of the lumbar spine were obtained without and with intravenous contrast. CONTRAST:  17 ml MultiHance. COMPARISON:  Abdominal pelvic CT 02/09/2016 and 01/09/2016. Lumbar MRI 09/25/2012. FINDINGS: Segmentation: Conventional anatomy assumed, with the last open disc space designated L5-S1. Alignment: Stable mild convex right scoliosis with near anatomic lateral alignment. Bones: The bone marrow is diffusely heterogeneous. Corresponding with the abnormalities demonstrated on recent CT is endplate edema and enhancement at L1-2 suspicious for discitis/osteomyelitis. There is corresponding T2 hyperintensity within the L1-2 disc. Endplate signal changes on the left at L2-3 are associated with some T2 hyperintensity in the L2-3 disc, but are probably degenerative as correlated with older prior studies. Patient also has extensive asymmetric facet arthropathy on the right at L2-3 and L3-4 with subchondral edema and enhancement. These joints may be infected as well. The visualized sacroiliac joints appear unremarkable. Conus medullaris: Extends to the L1 level and appears normal. No abnormal intradural enhancement is seen. However, there is posterior  epidural enhancement surrounding a fluid collection extending inferiorly from L3 into the mid sacrum, surrounding a pre-existing Tarlov cyst. This is best seen on sagittal image number 8 of series 8. This is suspicious for a posterior epidural abscess, supporting the  possibility of an infected right-sided facet joint. Paraspinal and other soft tissues: There is diffuse paraspinal edema and enhancement, asymmetric to the right. As seen on recent CT, there are small peripherally enhancing fluid collections within the erector spinae musculature on the right, measuring up to 2 cm in diameter. No focal fluid collection is seen within the psoas musculature. There is stable dilatation of the right renal pelvis. Disc levels: There are stable degenerative changes in the lower thoracic spine without resulting significant spinal stenosis or nerve root encroachment. L1-2: As above, probable findings of diskitis and osteomyelitis. There is stable annular disc bulging, facet and ligamentous hypertrophy contributing to mild mass effect on the thecal sac. No epidural fluid collection identified at this level. L2-3: Chronic degenerative disc disease with disc bulging, osteophytes and endplate degeneration asymmetric to the left. These findings have progressed from the 2013 CT. Disc space infection at this level is difficult to completely exclude, although not strongly suspected. There is asymmetric facet arthropathy on the right with a potentially infected facet joint. Moderate multifactorial spinal stenosis is present. L3-4: Progressive annular disc bulging, facet and ligamentous hypertrophy, contributing to moderate to severe spinal stenosis. There is asymmetric narrowing of the right lateral recess and right foramen. As above, there is potential infection of the right facet joint with a posterior epidural fluid collection extending inferiorly into the sacral canal. L4-5: Chronic degenerative disc disease with loss of disc height  and endplate osteophytes. Mild facet and ligamentous hypertrophy. Previously demonstrated right-sided disc extrusion has partially involuted. There is stable mild spinal stenosis and mild narrowing of the foramina. L5-S1: Disc height and hydration are maintained. No significant spinal stenosis or nerve root encroachment. IMPRESSION: 1. MR findings are supportive of diskitis and osteomyelitis at L1-2, as seen on recent CT. 2. In addition, there is concern of right-sided facet joint infection at L2-3 and L3-4. There is an associated posterior epidural fluid collection with peripheral enhancement extending from L3 into the mid sacrum, suspicious for an epidural abscess related to facet joint infection. This abscess exerts some mass effect on the thecal sac. 3. Grossly stable paraspinal inflammatory changes with small abscesses in the erector spinae musculature on the right. 4. Endplate changes asymmetric to the left at L2-3 are nonspecific and probably discogenic as correlated with prior CTs. Disc space infection at this level cannot be completely excluded. 5. Progression of multilevel spondylosis from previous MRI with resulting spinal stenosis at multiple levels as detailed above. Electronically Signed   By: Richardean Sale M.D.   On: 02/12/2016 17:08    Review of Systems  Musculoskeletal: Positive for back pain and joint pain.   Blood pressure 152/80, pulse 84, temperature 98.2 F (36.8 C), temperature source Oral, resp. rate 16, height _0  (1.727 m), weight 81.2 kg (179 lb 0.2 oz), SpO2 94 %. Physical Exam  Constitutional: She is oriented to person, place, and time.  Eyes: Pupils are equal, round, and reactive to light.  Neurological: She is alert and oriented to person, place, and time. She has normal strength. GCS eye subscore is 4. GCS verbal subscore is 5. GCS motor subscore is 6.  Strength is 5 out of 5 in her iliopsoas, quads, hamstrings gastrocs and EHL on the right status post left foot  partial amputation  Skin: Skin is warm and dry.    Assessment/Plan: 82 year old female with multiple systemic foci of infection including her brain heart spine etc. she's been now on IV antibiotics for over 6 weeks but  continues to have pain and persistence of the infection. Her MRI of her lumbar spine does show evidence of discitis osteomyelitis from L1 down the sacrum there is a dorsal component that is consistent with epidural abscess causing mild-to-moderate stenosis multiple facet joints that look like they're infected. Certainly with this lady's multiple medical comorbidities to include only being heparinized for PE she is a large risk for surgery and based on her current clinical progression with significant improvement over the last week no significant leg pain and full strength and no neurologic deficits, I do not recommend surgical decompression. Hopefully we can continue to manage this with IV antibiotics even if involves broadening her coverage or at the most CT guided aspiration of her disc space or the paraspinal infection. As surgical decompression or lumbar spine would involve a multilevel laminectomy in the setting of infected or septic facet joints discitis and osteomyelitis could be significantly destabilizing a lady with all these multiple medical comorbidities I will continue to maximize medical therapy.  Aqua Denslow P 02/12/2016, 6:38 PM

## 2016-02-12 NOTE — Progress Notes (Signed)
ANTICOAGULATION CONSULT NOTE - Follow Up Consult  Pharmacy Consult for Heparin Indication: pulmonary embolus  Allergies  Allergen Reactions  . Codeine Shortness Of Breath and Nausea And Vomiting  . Hydrocodone Shortness Of Breath and Nausea And Vomiting    Patient Measurements: Height: 5\' 8"  (172.7 cm) Weight: 179 lb 0.2 oz (81.2 kg) IBW/kg (Calculated) : 63.9  Ht 69.5 in  Wt: 81 kg  IBW: 67 kg Heparin Dosing Weight: 81 kg  Vital Signs: Temp: 98.1 F (36.7 C) (03/12 0800) Temp Source: Oral (03/12 0800) BP: 153/66 mmHg (03/12 1000) Pulse Rate: 81 (03/12 1100)  Labs:  Recent Labs  02/09/16 1947  02/10/16 1549 02/10/16 1959 02/11/16 0355 02/11/16 0406 02/11/16 1413 02/12/16 0420  HGB 8.3*  < >  --   --  7.4*  --  7.6* 8.4*  HCT 25.6*  < >  --   --  23.2*  --  23.8* 26.1*  PLT 447*  < >  --   --  426*  --  463* 396  LABPROT 17.1*  --   --   --   --   --   --   --   INR 1.39  --   --   --   --   --   --   --   HEPARINUNFRC  --   < >  --  0.37  --  0.56  --  0.38  CREATININE 0.78  < > 0.73  --  0.71  --   --  0.77  < > = values in this interval not displayed.  Estimated Creatinine Clearance: 62.7 mL/min (by C-G formula based on Cr of 0.77).   Medical History: Past Medical History  Diagnosis Date  . Hypertension   . Arthritis   . Depression   . Hyperlipidemia   . PFO (patent foramen ovale)     small  . H/O echocardiogram 2008    prolapse of post. mitral leaflet, tr MR, tr TR, EF >55%, mild concentric LVH  . H/O cardiovascular stress test 10/13/2007    low risk scan, no changes from previous test  . Heart palpitations     controlled on BB  . Hiatal hernia   . Cellulitis and abscess of toe of left foot 09/16/2014    Medications:  See electronic med rec  Assessment: 82 y.o. F presents from [facility] with flank pain x multiple months. CT scan positive for multiple subsegmental pulmonary emboli. No evidence of right heart strain. Currently therapeutic on  IV heparin at 1300 units/hr for PE. Hgb has improved. Plt wnl. RN reports no s/s of bleeding but thinks IV may have infiltrated. Heparin will be paused shortly while new IV site established.   Goal of Therapy:  Heparin level 0.3-0.7 units/ml Monitor platelets by anticoagulation protocol: Yes   Plan:  Continue heparin gtt at 1300 units/hr Daily heparin level and CBC F[facility]   Thank you for allowing us to participate in this patients care.  Vinnie LevelBenjamin Maricella Filyaw, PharmD., BCPS Clinical Pharmacist Pager [phone]

## 2016-02-12 NOTE — Consult Note (Signed)
Cassandra [name] for Infectious Disease  Date of Admission:  02/09/2016  Date of Consult:  02/12/2016  Reason for Consult: Paraspinal Abscess Referring Physician: Blaine Hamper  Impression/Recommendation Paraspinal Abscess Prev MSSA MV IE Prev septic CNS emboli Prev Splenic infarct Would add rifampin (avoid gent given her age) Continue naficillin as per prev rec's.  I would consider a longer course considering her new paraspinal abscess Neurosurgery eval.  Repeat BCx from 3-9 are ngtd  DVT with PE Heparin per primary team.   Funguria Stop diflucan  E coli UTI (R- ancef, cipro) Add ceftriaxone, short course   Hypokalemia Per primary team  Thank you so much for this interesting consult,   [name] (pager) [phone] [email or web address]  Cassandra [name] is an 82 y.o. female.  HPI: 82 yo F with hx chronic wounds on her L foot and of BCx 2/2 MSSA bacteremia Dec 31, 2014. Her course was complicated by confusion and CVA, and she was transferred to [facility] on 01-04-16. On MRI she was seen to have multiple punctate infarcts in multiple regions. She had TEE on 2-1 which showed a 2.3 cm strand like vegetation on her MV.  Her CT abd showed splenic infarct.  Repeat BCx 2-2 grew MSSA 1/2.  She was seen by ID and started on Nafcillin on 2-1. On 2-5 she underwent L foot transmet.  She was not felt to be operative candidate for her MV IE.  She was able to be d/c to SNF on 2-10. She was planned for 6 weeks of anbx.  Per pt she missed doses of anbx at snf, believes he PIC was not functioning well.  She had ID f/u note on 3-8 and was planned to complete anbx on 3-20.  She returns on 3-9 with L flank pain and incomplete healing of her surgical wound site. She had CT scan in ED which showed multiple pulmonary emboli. She also reportedly had CT as outpt which showed L1-2 discitis and paraspinal abscess.  She was started on heparin, continued on nafcillin.  She was also found to have LLE DVT.    She has been afebrile in hospital.  MRI spine done today: 1. MR findings are supportive of diskitis and osteomyelitis at L1-2, as seen on recent CT. 2. In addition, there is concern of right-sided facet joint infection at L2-3 and L3-4. There is an associated posterior epidural fluid collection with peripheral enhancement extending from L3 into the mid sacrum, suspicious for an epidural abscess related to facet joint infection. This abscess exerts some mass effect on the thecal sac. 3. Grossly stable paraspinal inflammatory changes with small abscesses in the erector spinae musculature on the right. 4. Endplate changes asymmetric to the left at L2-3 are nonspecific and probably discogenic as correlated with prior CTs. Disc space infection at this level cannot be completely excluded. 5. Progression of multilevel spondylosis from previous MRI with resulting spinal stenosis at multiple levels as detailed above.  Past Medical History  Diagnosis Date  . Hypertension   . Arthritis   . Depression   . Hyperlipidemia   . PFO (patent foramen ovale)     small  . H/O echocardiogram 2008    prolapse of post. mitral leaflet, tr MR, tr TR, EF >55%, mild concentric LVH  . H/O cardiovascular stress test 10/13/2007    low risk scan, no changes from previous test  . Heart palpitations     controlled on BB  . Hiatal hernia   . Cellulitis and abscess  of toe of left foot 09/16/2014    Past Surgical History  Procedure Laterality Date  . Cholecystectomy    . Cardiac catheterization  10/2002    normal coronary arteries, normal LV function  . Incision and drainage Right 07/21/2014    Procedure: INCISION AND DRAINAGE;  Surgeon: Marcheta Grammes, DPM;  Location: [facility];  Service: Podiatry;  Laterality: Right;  . Incision and drainage Left 09/17/2014    Procedure: INCISION AND DRAINAGE 1ST TOE LEFT FOOT;  Surgeon: Marcheta Grammes, DPM;  Location: [facility];  Service: Podiatry;   Laterality: Left;  . Bone biopsy Left 09/17/2014    Procedure: BONE BIOPSY;  Surgeon: Marcheta Grammes, DPM;  Location: [facility];  Service: Podiatry;  Laterality: Left;  . Amputation Left 09/20/2014    Procedure: AMPUTATION DIGIT (LEFT GREAT TOE);  Surgeon: Marcheta Grammes, DPM;  Location: [facility];  Service: Podiatry;  Laterality: Left;  . Tee without cardioversion N/A 01/05/2016    Procedure: TRANSESOPHAGEAL ECHOCARDIOGRAM (TEE);  Surgeon: Sanda Klein, MD;  Location: [facility];  Service: Cardiovascular;  Laterality: N/A;  . Amputation Left 01/08/2016    Procedure: AMPUTATION TRANSMETATARSAL LEFT FOOT and achilles tendon lengthening;  Surgeon: Wylene Simmer, MD;  Location: [facility];  Service: Orthopedics;  Laterality: Left;     Allergies  Allergen Reactions  . Codeine Shortness Of Breath and Nausea And Vomiting  . Hydrocodone Shortness Of Breath and Nausea And Vomiting    Medications:  Scheduled: . sodium chloride   Intravenous Once  . aspirin  81 mg Oral Daily  . atenolol  100 mg Oral Daily  . brimonidine  1 drop Both Eyes TID  . brinzolamide  1 drop Both Eyes TID  . calcium-vitamin D  1 tablet Oral Daily  . famotidine  40 mg Oral Daily  . feeding supplement (ENSURE ENLIVE)  237 mL Oral BID BM  . [START ON 02/13/2016] furosemide  40 mg Intravenous Daily  . isosorbide mononitrate  30 mg Oral Daily  . latanoprost  1 drop Both Eyes QHS  . losartan  100 mg Oral Daily  . multivitamin  1 tablet Oral BID  . nafcillin IVPB 2 gram/100 mL D5W (Pyxis)  2 g Intravenous Q4H  . nystatin  1 Bottle Topical BID  . pantoprazole  40 mg Oral BID  . saccharomyces boulardii  250 mg Oral BID  . sertraline  100 mg Oral Daily  . simvastatin  10 mg Oral QHS  . sodium chloride flush  3 mL Intravenous Q12H  . traMADol  50 mg Oral TID    Abtx:  Anti-infectives    Start     Dose/Rate Route Frequency Ordered Stop   02/11/16 1500  fluconazole (DIFLUCAN) tablet 200 mg     200 mg Oral  Once  02/11/16 1328 02/11/16 1521   02/10/16 0400  nafcillin 2 g in dextrose 5 % 100 mL IVPB     2 g 200 mL/hr over 30 Minutes Intravenous Every 4 hours 02/10/16 0046     02/10/16 0000  nafcillin 2 g in dextrose 5 % 100 mL IVPB     2 g 200 mL/hr over 30 Minutes Intravenous  Once 02/09/16 2341 02/10/16 0119      Total days of antibiotics: 42 nafcillin          Social History:  reports that she has never smoked. She has never used smokeless tobacco. She reports that she does not drink alcohol or use illicit drugs.  Family History  Problem Relation Age of Onset  . Heart disease Mother   . Cancer Father   . Kidney disease Brother   . Hypertension Daughter   . Hyperlipidemia Son     General ROS: denies f/c. no chnage in strength in LE. no numbness. see HPI , 12 point ros o/w negative.   Blood pressure 128/65, pulse 79, temperature 98.2 F (36.8 C), temperature source Oral, resp. rate 18, height '5\' 8"'  (1.727 m), weight 81.2 kg (179 lb 0.2 oz), SpO2 97 %. General appearance: alert, cooperative, no distress and pale Eyes: negative findings: conjunctivae and sclerae normal and pupils equal, round, reactive to light and accomodation Throat: normal findings: oropharynx pink & moist without lesions or evidence of thrush Neck: no adenopathy and supple, symmetrical, trachea midline Lungs: clear to auscultation bilaterally Heart: regular rate and rhythm Abdomen: normal findings: bowel sounds normal and soft, non-tender Extremities: edema none and RUE PIC is clean, no d/c. no erythema. LLE wound is clean, well healing. no d/c, nontneder, no erythema.    Results for orders placed or performed during the hospital encounter of 02/09/16 (from the past 48 hour(s))  Heparin level (unfractionated)     Status: None   Collection Time: 02/10/16  7:59 PM  Result Value Ref Range   Heparin Unfractionated 0.37 0.30 - 0.70 IU/mL    Comment:        IF HEPARIN RESULTS ARE BELOW EXPECTED VALUES, AND  PATIENT DOSAGE HAS BEEN CONFIRMED, SUGGEST FOLLOW UP TESTING OF ANTITHROMBIN III LEVELS.   CBC     Status: Abnormal   Collection Time: 02/11/16  3:55 AM  Result Value Ref Range   WBC 9.7 4.0 - 10.5 K/uL   RBC 2.63 (L) 3.87 - 5.11 MIL/uL   Hemoglobin 7.4 (L) 12.0 - 15.0 g/dL    Comment: REPEATED TO VERIFY   HCT 23.2 (L) 36.0 - 46.0 %   MCV 88.2 78.0 - 100.0 fL   MCH 28.1 26.0 - 34.0 pg   MCHC 31.9 30.0 - 36.0 g/dL   RDW 16.4 (H) 11.5 - 15.5 %   Platelets 426 (H) 150 - 400 K/uL  Magnesium     Status: None   Collection Time: 02/11/16  3:55 AM  Result Value Ref Range   Magnesium 1.7 1.7 - 2.4 mg/dL  Comprehensive metabolic panel     Status: Abnormal   Collection Time: 02/11/16  3:55 AM  Result Value Ref Range   Sodium 137 135 - 145 mmol/L   Potassium 3.1 (L) 3.5 - 5.1 mmol/L   Chloride 103 101 - 111 mmol/L   CO2 26 22 - 32 mmol/L   Glucose, Bld 101 (H) 65 - 99 mg/dL   BUN <5 (L) 6 - 20 mg/dL   Creatinine, Ser 0.71 0.44 - 1.00 mg/dL   Calcium 8.1 (L) 8.9 - 10.3 mg/dL   Total Protein 5.4 (L) 6.5 - 8.1 g/dL   Albumin 1.2 (L) 3.5 - 5.0 g/dL   AST 12 (L) 15 - 41 U/L   ALT 9 (L) 14 - 54 U/L   Alkaline Phosphatase 67 38 - 126 U/L   Total Bilirubin 1.4 (H) 0.3 - 1.2 mg/dL   GFR calc non Af Amer >60 >60 mL/min   GFR calc Af Amer >60 >60 mL/min    Comment: (NOTE) The eGFR has been calculated using the CKD EPI equation. This calculation has not been validated in all clinical situations. eGFR's persistently <60 mL/min signify possible Chronic Kidney Disease.  Anion gap 8 5 - 15  Heparin level (unfractionated)     Status: None   Collection Time: 02/11/16  4:06 AM  Result Value Ref Range   Heparin Unfractionated 0.56 0.30 - 0.70 IU/mL    Comment:        IF HEPARIN RESULTS ARE BELOW EXPECTED VALUES, AND PATIENT DOSAGE HAS BEEN CONFIRMED, SUGGEST FOLLOW UP TESTING OF ANTITHROMBIN III LEVELS.   TSH     Status: None   Collection Time: 02/11/16  4:07 AM  Result Value Ref Range    TSH 1.864 0.350 - 4.500 uIU/mL  Glucose, capillary     Status: None   Collection Time: 02/11/16  7:31 AM  Result Value Ref Range   Glucose-Capillary 99 65 - 99 mg/dL  CBC     Status: Abnormal   Collection Time: 02/11/16  2:13 PM  Result Value Ref Range   WBC 10.8 (H) 4.0 - 10.5 K/uL   RBC 2.69 (L) 3.87 - 5.11 MIL/uL   Hemoglobin 7.6 (L) 12.0 - 15.0 g/dL   HCT 23.8 (L) 36.0 - 46.0 %   MCV 88.5 78.0 - 100.0 fL   MCH 28.3 26.0 - 34.0 pg   MCHC 31.9 30.0 - 36.0 g/dL   RDW 16.3 (H) 11.5 - 15.5 %   Platelets 463 (H) 150 - 400 K/uL  Lactate dehydrogenase     Status: Abnormal   Collection Time: 02/11/16  2:13 PM  Result Value Ref Range   LDH 200 (H) 98 - 192 U/L  Reticulocytes     Status: Abnormal   Collection Time: 02/11/16  2:13 PM  Result Value Ref Range   Retic Ct Pct 3.9 (H) 0.4 - 3.1 %   RBC. 2.69 (L) 3.87 - 5.11 MIL/uL   Retic Count, Manual 104.9 19.0 - 186.0 K/uL  Iron and TIBC     Status: Abnormal   Collection Time: 02/11/16  2:13 PM  Result Value Ref Range   Iron 38 28 - 170 ug/dL   TIBC 109 (L) 250 - 450 ug/dL   Saturation Ratios 35 (H) 10.4 - 31.8 %   UIBC 71 ug/dL  Vitamin B12     Status: Abnormal   Collection Time: 02/11/16  2:13 PM  Result Value Ref Range   Vitamin B-12 1100 (H) 180 - 914 pg/mL    Comment: (NOTE) This assay is not validated for testing neonatal or myeloproliferative syndrome specimens for Vitamin B12 levels.   Folate     Status: None   Collection Time: 02/11/16  2:15 PM  Result Value Ref Range   Folate 7.8 >5.9 ng/mL  Type and screen [facility]     Status: None (Preliminary result)   Collection Time: 02/11/16  2:30 PM  Result Value Ref Range   ABO/RH(D) O POS    Antibody Screen POS    Sample Expiration 02/14/2016    Antibody Identification NO CLINICALLY SIGNIFICANT ANTIBODY IDENTIFIED    DAT, IgG POS    Unit Number [id number]    Blood Component Type RED CELLS,LR    Unit division 00    Status of Unit ISSUED,FINAL     Transfusion Status OK TO TRANSFUSE    Crossmatch Result COMPATIBLE    Unit Number [id number]    Blood Component Type RED CELLS,LR    Unit division 00    Status of Unit ALLOCATED    Transfusion Status OK TO TRANSFUSE    Crossmatch Result COMPATIBLE   Occult blood card to lab,  stool RN will collect     Status: None   Collection Time: 02/11/16  2:58 PM  Result Value Ref Range   Fecal Occult Bld NEGATIVE NEGATIVE  Prepare RBC     Status: None   Collection Time: 02/11/16  4:30 PM  Result Value Ref Range   Order Confirmation ORDER PROCESSED BY BLOOD BANK   Heparin level (unfractionated)     Status: None   Collection Time: 02/12/16  4:20 AM  Result Value Ref Range   Heparin Unfractionated 0.38 0.30 - 0.70 IU/mL    Comment:        IF HEPARIN RESULTS ARE BELOW EXPECTED VALUES, AND PATIENT DOSAGE HAS BEEN CONFIRMED, SUGGEST FOLLOW UP TESTING OF ANTITHROMBIN III LEVELS.   CBC     Status: Abnormal   Collection Time: 02/12/16  4:20 AM  Result Value Ref Range   WBC 8.4 4.0 - 10.5 K/uL   RBC 2.98 (L) 3.87 - 5.11 MIL/uL   Hemoglobin 8.4 (L) 12.0 - 15.0 g/dL   HCT 26.1 (L) 36.0 - 46.0 %   MCV 87.6 78.0 - 100.0 fL   MCH 28.2 26.0 - 34.0 pg   MCHC 32.2 30.0 - 36.0 g/dL   RDW 15.7 (H) 11.5 - 15.5 %   Platelets 396 150 - 400 K/uL  Comprehensive metabolic panel     Status: Abnormal   Collection Time: 02/12/16  4:20 AM  Result Value Ref Range   Sodium 136 135 - 145 mmol/L   Potassium 3.3 (L) 3.5 - 5.1 mmol/L   Chloride 100 (L) 101 - 111 mmol/L   CO2 24 22 - 32 mmol/L   Glucose, Bld 119 (H) 65 - 99 mg/dL   BUN <5 (L) 6 - 20 mg/dL   Creatinine, Ser 0.77 0.44 - 1.00 mg/dL   Calcium 8.0 (L) 8.9 - 10.3 mg/dL   Total Protein 5.4 (L) 6.5 - 8.1 g/dL   Albumin 1.3 (L) 3.5 - 5.0 g/dL   AST 12 (L) 15 - 41 U/L   ALT 8 (L) 14 - 54 U/L   Alkaline Phosphatase 67 38 - 126 U/L   Total Bilirubin 1.7 (H) 0.3 - 1.2 mg/dL   GFR calc non Af Amer >60 >60 mL/min   GFR calc Af Amer >60 >60 mL/min     Comment: (NOTE) The eGFR has been calculated using the CKD EPI equation. This calculation has not been validated in all clinical situations. eGFR's persistently <60 mL/min signify possible Chronic Kidney Disease.    Anion gap 12 5 - 15  Magnesium     Status: None   Collection Time: 02/12/16  4:20 AM  Result Value Ref Range   Magnesium 2.1 1.7 - 2.4 mg/dL  Glucose, capillary     Status: None   Collection Time: 02/12/16  7:43 AM  Result Value Ref Range   Glucose-Capillary 81 65 - 99 mg/dL      Component Value Date/Time   SDES URINE, CATHETERIZED 02/10/2016 0140   SPECREQUEST Normal 02/10/2016 0140   CULT >=100,000 COLONIES/mL ESCHERICHIA COLI 02/10/2016 0140   REPTSTATUS 02/12/2016 FINAL 02/10/2016 0140   No results found. Recent Results (from the past 240 hour(s))  Culture, blood (Routine X 2) w Reflex to ID Panel     Status: None (Preliminary result)   Collection Time: 02/09/16  7:37 PM  Result Value Ref Range Status   Specimen Description BLOOD LEFT ANTECUBITAL  Final   Special Requests IN PEDIATRIC BOTTLE Cole  Final   Culture  NO GROWTH 3 DAYS  Final   Report Status PENDING  Incomplete  Culture, blood (Routine X 2) w Reflex to ID Panel     Status: None (Preliminary result)   Collection Time: 02/09/16  7:47 PM  Result Value Ref Range Status   Specimen Description BLOOD RIGHT HAND  Final   Special Requests IN PEDIATRIC BOTTLE 1CC  Final   Culture NO GROWTH 3 DAYS  Final   Report Status PENDING  Incomplete  Urine culture     Status: None   Collection Time: 02/10/16  1:40 AM  Result Value Ref Range Status   Specimen Description URINE, CATHETERIZED  Final   Special Requests Normal  Final   Culture >=100,000 COLONIES/mL ESCHERICHIA COLI  Final   Report Status 02/12/2016 FINAL  Final   Organism ID, Bacteria ESCHERICHIA COLI  Final      Susceptibility   Escherichia coli - MIC*    AMPICILLIN >=32 RESISTANT Resistant     CEFAZOLIN >=64 RESISTANT Resistant     CEFTRIAXONE <=1  SENSITIVE Sensitive     CIPROFLOXACIN >=4 RESISTANT Resistant     GENTAMICIN <=1 SENSITIVE Sensitive     IMIPENEM <=0.25 SENSITIVE Sensitive     NITROFURANTOIN <=16 SENSITIVE Sensitive     TRIMETH/SULFA 160 RESISTANT Resistant     AMPICILLIN/SULBACTAM >=32 RESISTANT Resistant     PIP/TAZO >=128 RESISTANT Resistant     * >=100,000 COLONIES/mL ESCHERICHIA COLI  MRSA PCR Screening     Status: None   Collection Time: 02/10/16  5:10 AM  Result Value Ref Range Status   MRSA by PCR NEGATIVE NEGATIVE Final    Comment:        The GeneXpert MRSA Assay (FDA approved for NASAL specimens only), is one component of a comprehensive MRSA colonization surveillance program. It is not intended to diagnose MRSA infection nor to guide or monitor treatment for MRSA infections.       02/12/2016, 3:55 PM     LOS: 2 days    Records and images were personally reviewed where available.

## 2016-02-12 NOTE — Progress Notes (Signed)
PROGRESS NOTE  Cassandra [name] RUE:[id number] DOB: November 20, 1933 DOA: 02/09/2016 PCP: Colette Ribas, MD  HPI/Recap of past 24 hours:  On heparin drip, report right sided pleuritic chest pain has much improved, on room air, denies sob, very pleasant Indwelling foley with clear urine. No family in room  Assessment/Plan: Principal Problem:   Acute pulmonary embolism (HCC) Active Problems:   Hypertension   Hyperlipidemia   PFO (patent foramen ovale)   Cerebral embolism with cerebral infarction   Bacterial endocarditis   Chronic osteomyelitis of left foot (HCC)   S/P amputation of foot (HCC)   GERD (gastroesophageal reflux disease)   PE (pulmonary embolism)  Acute PE and left lower extremity  DVT: continue heparin drip, tachycardia has resolved, now hemodynamically stable, repeat echo right ventricle systolic function wnl, lvef 65-75%, NO WALL MOTION abnormalities. No obvious vegetation. May need to keep on heparin drip for now , due to concerning of paraspinal abscess and possible surgical intervention. Consider transition to elliquis if no surgery planned.  Pleural effusion: small right ? Left, from PE? Also has low albumin, anasarca, Does has grade i diastolic dysfunction on echo, mild pitting edema on right leg, more edema on left leg (+DVt left leg), on room air,  Lasix  after prbc x1 unit of 3/11. Start lasix  iv qd.  H/o Urinary retention, CT scan from 01/10/2016 showing bilateral hydronephrosis without evidence of distal obstruction. -A Foley catheter was placed from last hospitalization, due to failure of voiding trial, she is discharged to [facility] with foley, patient reported she was evaluated by urology Dr Patsi Sears this week prior to being admitted to the hospital, she is advised to continue foley.  Will call Dr urology on Monday for foley management.  ua from 3/10 on presentation during this hospitalization + bacteriuria , + yeast, culture pending, no fever, no  leukocytosis, will wait for culture for now.  Yeast in the urine: start diflucan  Discitis/ l1-l2/right paraspinal abscess? Reported on outpatient CT ab/pel, I have discussed with radiology Dr Molli Posey  Who recommended MRI lumber spine w/wo contrast, if confirmed will need to call neurosurgery. Infectious disease aware as well.   Endocarditis: Evidenced by blood cultures from 12/31/2015 growing MSSA. She is currently on Nafcillin 2 g IV every 4 hours per Dr. Erich Montane recommendation, supposed to be treated until 02/20/16. Source of bacteremia felt to be left foot osteomyelitis. Pt is s/p of transmetatarsal amputation of left foot osteo.Repeat blood cultures from 01/06/2016 showing no growth to date. Per discharge summary, CVTS will reassess after antibiotics therapy completed to decide at that time needs for surgery/valvular repair.  -continue IV nafcillin -Repeat blood culture no growth so far  Chronic left foot wound/osteomyelitis . MRI of foot from 01/03/2016 reported by radiology to show possible underlying osteomyelitis, cannot rule out underlying abscess. She underwent transmetatarsal amputation of left foot for chronic osteomyelitis.  -continue IV nafcillin -outpatient follow up with orthopedic service as an outpatient   Splenic infarct. Patient complaining of abdominal pain on 01/09/2016, further worked up with CT scan of abdomen and pelvis with IV contrast, study revealed splenic infarct involving 30%, likely secondary to septic emboli.  -Continue IV antibiotics as above   Normocytic Anemia: likely anemia of chronic disease: hgb has been fluctuating ranging from 7.6 to 10 since 01/2016, she received two prbc during last hospitalization.  stool negative for occult blood, urine no gross hematuria, no obvious bleeding, bp stable, patient just had ct ab on 3/9, no retroperitoneal hematoma,  anemia work up showed inappropriate low reticulocytes , otherwise unremarkable. Transfuse 1prbc on 3/11.  Appropriate increase of hgb post transfusion.   HTN; -Atenolol, losartan,  HLD: Last LDL was 71 on 01/06/16 -Continue home medications: Zocor  Depression and anxiety: Stable, no suicidal or homicidal ideations. -Continue home medications  DVT ppx: on IV heparin   Code Status: full  Family Communication: patient and grand daughter over the phone  Disposition Plan: remain in stepdown on heparin drip, consider transition to NOAC if no surgery planned   Consultants:  pccm phone conversation by admitting MD DR Clyde Lundborg  Infectious disease  Procedures:  none  Antibiotics:  nafacillin   Objective: BP 146/73 mmHg  Pulse 82  Temp(Src) 98.2 F (36.8 C) (Oral)  Resp 16  Ht  (1.727 m)  Wt 81.2 kg (179 lb 0.2 oz)  BMI 27.23 kg/m2  SpO2 97%  Intake/Output Summary (Last 24 hours) at 02/12/16 1515 Last data filed at 02/12/16 1300  Gross per 24 hour  Intake   1468 ml  Output   1915 ml  Net   -447 ml   Filed Weights   02/10/16 0522  Weight: 81.2 kg (179 lb 0.2 oz)    Exam:   General:  NAD  Cardiovascular: RRR  Respiratory: CTABL  Abdomen: Soft/ND/NT, positive BS, indwelling foley   Musculoskeletal: left foot post op changes /dressing intact, bilateral lower extremity edema, left >right.  Neuro: aaox3, subtle right hand weakness which is improving per patient  Data Reviewed: Basic Metabolic Panel:  Recent Labs Lab 02/09/16 1947 02/10/16 0344 02/10/16 1549 02/11/16 0355 02/12/16 0420  NA 138 136 136 137 136  K 3.4* 3.5 3.1* 3.1* 3.3*  CL 102 102 101 103 100*  CO2 25 21* GLUCOSE 99 133* 104* 101* 119*  BUN 6 <5* <5* <5* <5*  CREATININE 0.78 0.65 0.73 0.71 0.77  CALCIUM 8.5* 8.2* 8.4* 8.1* 8.0*  MG  --  1.8  --  1.7 2.1   Liver Function Tests:  Recent Labs Lab 02/09/16 1947 02/10/16 1549 02/11/16 0355 02/12/16 0420  AST 12* 13* 12* 12*  ALT 7* 7* 9* 8*  ALKPHOS 79 72 67 67  BILITOT 1.4* 1.5* 1.4* 1.7*  PROT 6.3* 5.7* 5.4*  5.4*  ALBUMIN 1.5* 1.4* 1.2* 1.3*   No results for input(s): LIPASE, AMYLASE in the last 168 hours. No results for input(s): AMMONIA in the last 168 hours. CBC:  Recent Labs Lab 02/08/16 1720 02/09/16 1947 02/10/16 0344 02/11/16 0355 02/11/16 1413 02/12/16 0420  WBC 12.8* 11.6* 10.1 9.7 10.8* 8.4  NEUTROABS 8.7* 7.4  --   --   --   --   HGB 8.0* 8.3* 10.7* 7.4* 7.6* 8.4*  HCT 24.9* 25.6* 33.5* 23.2* 23.8* 26.1*  MCV 88.9 88.0 87.2 88.2 88.5 87.6  PLT 461* 447* 385 426* 463* 396   Cardiac Enzymes:   No results for input(s): CKTOTAL, CKMB, CKMBINDEX, TROPONINI in the last 168 hours. BNP (last 3 results)  Recent Labs  01/01/16 1142  BNP 432.0*    ProBNP (last 3 results) No results for input(s): PROBNP in the last 8760 hours.  CBG:  Recent Labs Lab 02/10/16 0722 02/11/16 0731 02/12/16 0743  GLUCAP 109* 99 81    Recent Results (from the past 240 hour(s))  Culture, blood (Routine X 2) w Reflex to ID Panel     Status: None (Preliminary result)   Collection Time: 02/09/16  7:37 PM  Result Value Ref  Range Status   Specimen Description BLOOD LEFT ANTECUBITAL  Final   Special Requests IN PEDIATRIC BOTTLE 1CC  Final   Culture NO GROWTH 3 DAYS  Final   Report Status PENDING  Incomplete  Culture, blood (Routine X 2) w Reflex to ID Panel     Status: None (Preliminary result)   Collection Time: 02/09/16  7:47 PM  Result Value Ref Range Status   Specimen Description BLOOD RIGHT HAND  Final   Special Requests IN PEDIATRIC BOTTLE 1CC  Final   Culture NO GROWTH 3 DAYS  Final   Report Status PENDING  Incomplete  Urine culture     Status: None   Collection Time: 02/10/16  1:40 AM  Result Value Ref Range Status   Specimen Description URINE, CATHETERIZED  Final   Special Requests Normal  Final   Culture >=100,000 COLONIES/mL ESCHERICHIA COLI  Final   Report Status 02/12/2016 FINAL  Final   Organism ID, Bacteria ESCHERICHIA COLI  Final      Susceptibility   Escherichia  coli - MIC*    AMPICILLIN >=32 RESISTANT Resistant     CEFAZOLIN >=64 RESISTANT Resistant     CEFTRIAXONE <=1 SENSITIVE Sensitive     CIPROFLOXACIN >=4 RESISTANT Resistant     GENTAMICIN <=1 SENSITIVE Sensitive     IMIPENEM <=0.25 SENSITIVE Sensitive     NITROFURANTOIN <=16 SENSITIVE Sensitive     TRIMETH/SULFA 160 RESISTANT Resistant     AMPICILLIN/SULBACTAM >=32 RESISTANT Resistant     PIP/TAZO >=128 RESISTANT Resistant     * >=100,000 COLONIES/mL ESCHERICHIA COLI  MRSA PCR Screening     Status: None   Collection Time: 02/10/16  5:10 AM  Result Value Ref Range Status   MRSA by PCR NEGATIVE NEGATIVE Final    Comment:        The GeneXpert MRSA Assay (FDA approved for NASAL specimens only), is one component of a comprehensive MRSA colonization surveillance program. It is not intended to diagnose MRSA infection nor to guide or monitor treatment for MRSA infections.      Studies: No results found.  Scheduled Meds: . sodium chloride   Intravenous Once  . aspirin  81 mg Oral Daily  . atenolol  100 mg Oral Daily  . brimonidine  1 drop Both Eyes TID  . brinzolamide  1 drop Both Eyes TID  . calcium-vitamin D  1 tablet Oral Daily  . famotidine  40 mg Oral Daily  . feeding supplement (ENSURE ENLIVE)  237 mL Oral BID BM  . furosemide  40 mg Intravenous On Call  . furosemide  40 mg Intravenous Once  . [START ON 02/13/2016] furosemide  40 mg Intravenous Daily  . isosorbide mononitrate  30 mg Oral Daily  . latanoprost  1 drop Both Eyes QHS  . losartan  100 mg Oral Daily  . multivitamin  1 tablet Oral BID  . nafcillin IVPB 2 gram/100 mL D5W (Pyxis)  2 g Intravenous Q4H  . nystatin  1 Bottle Topical BID  . pantoprazole  40 mg Oral BID  . saccharomyces boulardii  250 mg Oral BID  . sertraline  100 mg Oral Daily  . simvastatin  10 mg Oral QHS  . sodium chloride flush  3 mL Intravenous Q12H  . traMADol  50 mg Oral TID    Continuous Infusions: . heparin 1,300 Units/hr (02/12/16  1328)     Time spent:  Benjamyn Hestand MD, PhD  [facility] Pager [phone]. If 7PM-7AM, please contact night-coverage  at [email or web address], password [facility] [email or web address] 02/12/2016, 3:15 PM  LOS: 2 days

## 2016-02-13 ENCOUNTER — Inpatient Hospital Stay (HOSPITAL_COMMUNITY): Payer: Medicare Other

## 2016-02-13 ENCOUNTER — Other Ambulatory Visit (HOSPITAL_COMMUNITY): Payer: Medicare Other

## 2016-02-13 ENCOUNTER — Encounter (HOSPITAL_COMMUNITY): Payer: Self-pay | Admitting: Radiology

## 2016-02-13 DIAGNOSIS — M4646 Discitis, unspecified, lumbar region: Secondary | ICD-10-CM | POA: Insufficient documentation

## 2016-02-13 DIAGNOSIS — I330 Acute and subacute infective endocarditis: Secondary | ICD-10-CM

## 2016-02-13 DIAGNOSIS — M86672 Other chronic osteomyelitis, left ankle and foot: Secondary | ICD-10-CM

## 2016-02-13 DIAGNOSIS — I63133 Cerebral infarction due to embolism of bilateral carotid arteries: Secondary | ICD-10-CM

## 2016-02-13 LAB — CBC
HEMATOCRIT: 28.5 % — AB (ref 36.0–46.0)
Hemoglobin: 9.1 g/dL — ABNORMAL LOW (ref 12.0–15.0)
MCH: 28.2 pg (ref 26.0–34.0)
MCHC: 31.9 g/dL (ref 30.0–36.0)
MCV: 88.2 fL (ref 78.0–100.0)
PLATELETS: 436 10*3/uL — AB (ref 150–400)
RBC: 3.23 MIL/uL — AB (ref 3.87–5.11)
RDW: 16.3 % — AB (ref 11.5–15.5)
WBC: 9.1 10*3/uL (ref 4.0–10.5)

## 2016-02-13 LAB — COMPREHENSIVE METABOLIC PANEL
ALBUMIN: 1.4 g/dL — AB (ref 3.5–5.0)
ALT: 8 U/L — AB (ref 14–54)
AST: 14 U/L — AB (ref 15–41)
Alkaline Phosphatase: 74 U/L (ref 38–126)
Anion gap: 10 (ref 5–15)
CHLORIDE: 98 mmol/L — AB (ref 101–111)
CO2: 28 mmol/L (ref 22–32)
CREATININE: 0.76 mg/dL (ref 0.44–1.00)
Calcium: 8.2 mg/dL — ABNORMAL LOW (ref 8.9–10.3)
GFR calc Af Amer: >60 mL/min (ref >60)
GLUCOSE: 95 mg/dL (ref 65–99)
POTASSIUM: 3.3 mmol/L — AB (ref 3.5–5.1)
Sodium: 136 mmol/L (ref 135–145)
Total Bilirubin: 1.9 mg/dL — ABNORMAL HIGH (ref 0.3–1.2)
Total Protein: 6 g/dL — ABNORMAL LOW (ref 6.5–8.1)

## 2016-02-13 LAB — GLUCOSE, CAPILLARY: GLUCOSE-CAPILLARY: 73 mg/dL (ref 65–99)

## 2016-02-13 LAB — POCT ACTIVATED CLOTTING TIME: Activated Clotting Time: 173 seconds

## 2016-02-13 LAB — HEPARIN LEVEL (UNFRACTIONATED): HEPARIN UNFRACTIONATED: 0.51 [IU]/mL (ref 0.30–0.70)

## 2016-02-13 MED ORDER — FLUTICASONE PROPIONATE 50 MCG/ACT NA SUSP
2.0000 | Freq: Every day | NASAL | Status: DC
  Administered 2016-02-15 – 2016-02-16 (×2): 2 via NASAL
  Filled 2016-02-13: qty 16

## 2016-02-13 MED ORDER — FENTANYL CITRATE (PF) 100 MCG/2ML IJ SOLN
INTRAMUSCULAR | Status: AC
  Filled 2016-02-13: qty 2

## 2016-02-13 MED ORDER — SODIUM CHLORIDE 0.9 % IV SOLN: INTRAVENOUS | Status: DC

## 2016-02-13 MED ORDER — POTASSIUM CHLORIDE CRYS ER 20 MEQ PO TBCR
40.0000 meq | EXTENDED_RELEASE_TABLET | Freq: Once | ORAL | Status: AC
  Administered 2016-02-13: 40 meq via ORAL
  Filled 2016-02-13: qty 2

## 2016-02-13 MED ORDER — CIPROFLOXACIN-DEXAMETHASONE 0.3-0.1 % OT SUSP
4.0000 [drp] | Freq: Two times a day (BID) | OTIC | Status: DC
  Administered 2016-02-13 – 2016-02-16 (×7): 4 [drp] via OTIC
  Filled 2016-02-13: qty 7.5

## 2016-02-13 MED ORDER — FENTANYL CITRATE (PF) 100 MCG/2ML IJ SOLN
INTRAMUSCULAR | Status: AC | PRN
  Administered 2016-02-13: 25 ug via INTRAVENOUS

## 2016-02-13 MED ORDER — BUPIVACAINE HCL (PF) 0.25 % IJ SOLN
INTRAMUSCULAR | Status: AC
  Filled 2016-02-13: qty 30

## 2016-02-13 NOTE — Sedation Documentation (Signed)
Patient denies pain and is resting comfortably.  

## 2016-02-13 NOTE — Clinical Social Work Note (Signed)
Clinical Social Work Assessment  Patient Details  Name: Cassandra [name] MRN: [id number] Date of Birth: 09/06/1933  Date of referral:  02/13/16               Reason for consult:  Facility Placement                Permission sought to share information with:  Family Supports Permission granted to share information::  Yes, Verbal Permission Granted  Name::     Nancy Kinney  Relationship::  Daughter  Contact Information:  [phone]  Housing/Transportation Living arrangements for the past 2 months:  Skilled Nursing Facility Source of Information:  Patient Patient Interpreter Needed:  None Criminal Activity/Legal Involvement Pertinent to Current Situation/Hospitalization:  No - Comment as needed Significant Relationships:  Adult Children Lives with:  Facility Resident Do you feel safe going back to the place where you live?  Yes Need for family participation in patient care:  Yes (Comment)  Care giving concerns:  Patient with no family/friends at bedside and states that she will communicate plans when they arrive to visit.   Social Worker assessment / plan:  Clinical Social Worker met with patient at bedside to offer support and discuss patient needs at discharge.  Patient states that she has been living at [facility] in a private room and is hopeful to return to the facility and her same room.  Patient granddaughter is working for [facility] and has been helpful with patient transition.  Patient states that she cried when she had to leave the facility and return to the hospital.  Patient is beyond happy with placement at [facility] and excited for return.  CSW to send facility updated clinicals and submit for insurance authorization.  CSW remains available for support and to facilitate patient discharge needs once medically stable.  Employment status:  Retired Insurance information:  Managed Medicare PT Recommendations:  Skilled Nursing Facility Information / Referral to community  resources:  Skilled Nursing Facility  Patient/Family's Response to care:  Patient beyond thrilled to return to [facility].  Patient verbalized her understanding of CSW role and appreciation of  Support.  Patient/Family's Understanding of and Emotional Response to Diagnosis, Current Treatment, and Prognosis:  Patient with a good understanding of her rehab needs and current medical conditions.  Patient with good family support available as needed.  Emotional Assessment Appearance:  Appears stated age Attitude/Demeanor/Rapport:   (Appropriate, Cooperative, and Engaged) Affect (typically observed):  Appropriate, Calm, Hopeful, Pleasant Orientation:  Oriented to Self, Oriented to Place, Oriented to  Time, Oriented to Situation Alcohol / Substance use:  Not Applicable Psych involvement (Current and /or in the community):  No (Comment)  Discharge Needs  Concerns to be addressed:  Discharge Planning Concerns Readmission within the last 30 days:  Yes Current discharge risk:  None Barriers to Discharge:  Continued Medical Work up  Jesse , LCSW [phone]  

## 2016-02-13 NOTE — Sedation Documentation (Signed)
Patient is resting comfortably. 

## 2016-02-13 NOTE — Progress Notes (Signed)
Chief Complaint: Patient was seen in consultation today for back pain at the request of Dr. Albertine GratesFang Xu  Referring Physician(s): Dr. Albertine GratesFang Xu  Supervising Physician: Dr. Corliss Skainseveshwar  History of Present Illness: Cassandra [name] is a 82 y.o. 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 She now has DVT and PE as well and requires IV heparin. She has developed flank and back pain as well. Recent imaging of her back finds evidence of L1-L2 discitis/oseteomyelitis as well as paravertebral inflammatory changes/small abscess development. ID and NS consulted, no plans/too high risk for open operative intervention. IR is asked to obtain tissue/fluid sampling for cultures. Chart, PMHX, meds, albs, imaging, allergies reviewed.   Past Medical History  Diagnosis Date  . Hypertension   . Arthritis   . Depression   . Hyperlipidemia   . PFO (patent foramen ovale)     small  . H/O echocardiogram 2008    prolapse of post. mitral leaflet, tr MR, tr TR, EF >55%, mild concentric LVH  . H/O cardiovascular stress test 10/13/2007    low risk scan, no changes from previous test  . Heart palpitations     controlled on BB  . Hiatal hernia   . Cellulitis and abscess of toe of left foot 09/16/2014    Past Surgical History  Procedure Laterality Date  . Cholecystectomy    . Cardiac catheterization  10/2002    normal coronary arteries, normal LV function  . Incision and drainage Right 07/21/2014    Procedure: INCISION AND DRAINAGE;  Surgeon: Dallas SchimkeBenjamin Ivan McKinney, DPM;  Location: [facility];  Service: Podiatry;  Laterality: Right;  . Incision and drainage Left 09/17/2014    Procedure: INCISION AND DRAINAGE 1ST TOE LEFT FOOT;  Surgeon: Dallas SchimkeBenjamin Ivan McKinney, DPM;  Location: [facility];  Service: Podiatry;  Laterality: Left;  . Bone biopsy Left 09/17/2014    Procedure: BONE BIOPSY;  Surgeon: Dallas SchimkeBenjamin Ivan McKinney, DPM;  Location: [facility];   Service: Podiatry;  Laterality: Left;  . Amputation Left 09/20/2014    Procedure: AMPUTATION DIGIT (LEFT GREAT TOE);  Surgeon: Dallas SchimkeBenjamin Ivan McKinney, DPM;  Location: [facility];  Service: Podiatry;  Laterality: Left;  . Tee without cardioversion N/A 01/05/2016    Procedure: TRANSESOPHAGEAL ECHOCARDIOGRAM (TEE);  Surgeon: Thurmon FairMihai Croitoru, MD;  [facility];  Service: Cardiovascular;  Laterality: N/A;  . Amputation Left 01/08/2016    Procedure: AMPUTATION TRANSMETATARSAL LEFT FOOT and achilles tendon lengthening;  Surgeon: Toni ArthursJohn Hewitt, MD;  Location: [facility];  Service: Orthopedics;  Laterality: Left;    Allergies: Codeine and Hydrocodone  Medications:  Current facility-administered medications:  .  0.9 %  sodium chloride infusion, , Intravenous, Once, Albertine GratesFang Xu, MD .  acetaminophen (TYLENOL) tablet 650 mg, 650 mg, Oral, Q6H PRN **OR** acetaminophen (TYLENOL) suppository 650 mg, 650 mg, Rectal, Q6H PRN, Lorretta HarpXilin Niu, MD .  aspirin chewable tablet 81 mg, 81 mg, Oral, Daily, Lorretta HarpXilin Niu, MD, 81 mg at 02/13/16 0955 .  atenolol (TENORMIN) tablet 100 mg, 100 mg, Oral, Daily, Lorretta HarpXilin Niu, MD, 100 mg at 02/13/16 0956 .  brimonidine (ALPHAGAN) 0.2 % ophthalmic solution 1 drop, 1 drop, Both Eyes, TID, Lorretta HarpXilin Niu, MD, 1 drop at 02/13/16 1000 .  brinzolamide (AZOPT) 1 % ophthalmic suspension 1 drop, 1 drop, Both Eyes, TID, Lorretta HarpXilin Niu, MD, 1 drop at 02/13/16 1000 .  calcium-vitamin D (OSCAL WITH D) 500-200 MG-UNIT per tablet 1 tablet, 1 tablet, Oral, Daily, Lorretta HarpXilin Niu, MD,  1 tablet at 02/13/16 0956 .  cefTRIAXone (ROCEPHIN) 1 g in dextrose 5 % 50 mL IVPB, 1 g, Intravenous, Q24H, Ginnie Smart, MD, 1 g at 02/12/16 1805 .  ciprofloxacin-dexamethasone (CIPRODEX) 0.3-0.1 % otic suspension 4 drop, 4 drop, Right Ear, BID, Albertine Grates, MD .  famotidine (PEPCID) tablet 40 mg, 40 mg, Oral, Daily, Lorretta Harp, MD, 40 mg at 02/13/16 0956 .  feeding supplement (ENSURE ENLIVE) (ENSURE ENLIVE) liquid 237 mL, 237 mL, Oral, BID BM,  Lorretta Harp, MD, 237 mL at 02/13/16 0957 .  fluticasone (FLONASE) 50 MCG/ACT nasal spray 2 spray, 2 spray, Each Nare, Daily, Albertine Grates, MD .  furosemide (LASIX) injection 40 mg, 40 mg, Intravenous, Daily, Albertine Grates, MD, 40 mg at 02/13/16 1002 .  gadobenate dimeglumine (MULTIHANCE) injection 17 mL, 17 mL, Intravenous, Once PRN, Albertine Grates, MD .  heparin ADULT infusion 100 units/mL (25000 units/250 mL), 1,300 Units/hr, Intravenous, Continuous, Signe Colt, RPH, Last Rate: 13 mL/hr at 02/13/16 1000, 1,300 Units/hr at 02/13/16 1000 .  HYDROmorphone (DILAUDID) tablet 1 mg, 1 mg, Oral, Q4H PRN, Lorretta Harp, MD .  isosorbide mononitrate (IMDUR) 24 hr tablet 30 mg, 30 mg, Oral, Daily, Lorretta Harp, MD, 30 mg at 02/13/16 0956 .  latanoprost (XALATAN) 0.005 % ophthalmic solution 1 drop, 1 drop, Both Eyes, QHS, Lorretta Harp, MD, 1 drop at 02/12/16 2129 .  losartan (COZAAR) tablet 100 mg, 100 mg, Oral, Daily, Lorretta Harp, MD, 100 mg at 02/13/16 0956 .  multivitamin (PROSIGHT) tablet 1 tablet, 1 tablet, Oral, BID, Lorretta Harp, MD, 1 tablet at 02/13/16 0956 .  nafcillin 2 g in dextrose 5 % 100 mL IVPB, 2 g, Intravenous, Q4H, Lorretta Harp, MD, 2 g at 02/13/16 0805 .  nystatin (MYCOSTATIN/NYSTOP) topical powder 1 Bottle, 1 Bottle, Topical, BID, Lorretta Harp, MD, 1 Bottle at 02/13/16 [phone] .  ondansetron (ZOFRAN-ODT) disintegrating tablet 4 mg, 4 mg, Oral, Q8H PRN, Lorretta Harp, MD, 4 mg at 02/11/16 2211 .  pantoprazole (PROTONIX) EC tablet 40 mg, 40 mg, Oral, BID, Lorretta Harp, MD, 40 mg at 02/13/16 0956 .  polyvinyl alcohol (LIQUIFILM TEARS) 1.4 % ophthalmic solution 1 drop, 1 drop, Both Eyes, TID PRN, Lorretta Harp, MD .  rifampin (RIFADIN) capsule 300 mg, 300 mg, Oral, Q12H, Ginnie Smart, MD, 300 mg at 02/13/16 0956 .  saccharomyces boulardii (FLORASTOR) capsule 250 mg, 250 mg, Oral, BID, Lorretta Harp, MD, 250 mg at 02/13/16 0956 .  sertraline (ZOLOFT) tablet 100 mg, 100 mg, Oral, Daily, Lorretta Harp, MD, 100 mg at 02/13/16 0956 .  simvastatin  (ZOCOR) tablet 10 mg, 10 mg, Oral, QHS, Lorretta Harp, MD, 10 mg at 02/12/16 2131 .  sodium chloride flush (NS) 0.9 % injection 10-40 mL, 10-40 mL, Intracatheter, PRN, Zadie Rhine, MD .  sodium chloride flush (NS) 0.9 % injection 3 mL, 3 mL, Intravenous, Q12H, Lorretta Harp, MD, 3 mL at 02/13/16 1000 .  traMADol (ULTRAM) tablet 50 mg, 50 mg, Oral, TID, Lorretta Harp, MD, 50 mg at 02/13/16 9604    Family History  Problem Relation Age of Onset  . Heart disease Mother   . Cancer Father   . Kidney disease Brother   . Hypertension Daughter   . Hyperlipidemia Son     Social History   Social History  . Marital Status: Married    Spouse Name: N/A  . Number of Children: N/A  . Years of Education: N/A   Social History Main Topics  . Smoking status: Never  Smoker   . Smokeless tobacco: Never Used  . Alcohol Use: No  . Drug Use: No  . Sexual Activity: Not Asked   Other Topics Concern  . None   Social History Narrative     Review of Systems: A 12 point ROS discussed and pertinent positives are indicated in the HPI above.  All other systems are negative.  Review of Systems  Vital Signs: BP 169/83 mmHg  Pulse 88  Temp(Src) 98.3 F (36.8 C) (Oral)  Resp 20  Ht  (1.727 m)  Wt 179 lb 0.2 oz (81.2 kg)  BMI 27.23 kg/m2  SpO2 96%  Physical Exam  Constitutional: She is oriented to person, place, and time. She appears well-developed and well-nourished. No distress.  Sitting up in chair  HENT:  Head: Normocephalic.  Mouth/Throat: Oropharynx is clear and moist.  Neck: Normal range of motion. No JVD present. No tracheal deviation present.  Cardiovascular: Normal rate, regular rhythm and normal heart sounds.   Pulmonary/Chest: Effort normal and breath sounds normal. No respiratory distress.  Musculoskeletal:  Tender lumbar area  Neurological: She is alert and oriented to person, place, and time.  Skin: Skin is warm and dry.  Psychiatric: She has a normal mood and affect. Judgment  normal.    Mallampati Score:  MD Evaluation Airway: WNL Heart: WNL Abdomen: WNL Chest/ Lungs: WNL ASA  Classification: 3 Mallampati/Airway Score: Two  Imaging: Ct Angio Chest Pe W/cm &/or Wo Cm  02/09/2016  CLINICAL DATA:  Pleuritic chest pain. Recent history of bacteremia. Previous CT abdomen and pelvis demonstrated right lower lobe pulmonary emboli. EXAM: CT ANGIOGRAPHY CHEST WITH CONTRAST TECHNIQUE: Multidetector CT imaging of the chest was performed using the standard protocol during bolus administration of intravenous contrast. Multiplanar CT image reconstructions and MIPs were obtained to evaluate the vascular anatomy. CONTRAST:  OMNIPAQUE IOHEXOL 300 MG/ML  SOLN COMPARISON:  CT abdomen and pelvis 02/09/2016 FINDINGS: Technically adequate study with good opacification of the central and segmental pulmonary arteries. Filling defects are demonstrated in peripheral subsegmental right lower lobe pulmonary arteries with at least 3 branch vessels involved. This corresponds changes seen on the previous abdominal CT scan. There may also be additional embolus to subsegmental right middle lobe branches. No large central pulmonary emboli. No evidence of right heart strain. Normal heart size. Normal caliber thoracic aorta. No aortic dissection. Calcification in the aorta and coronary arteries. Esophagus is decompressed. Mediastinal lymph nodes are not pathologically enlarged and are likely reactive. Small bilateral pleural effusions with basilar atelectasis. Patchy infiltration in the right lung, involving portions of the right upper lung, right middle lung, and right lower lung, may represent pneumonia or pulmonary infarcts. No pneumothorax. Included portions of the upper abdominal organs demonstrate splenic enlargement with heterogeneous appearance suggesting splenic infarct. No destructive bone lesions. Review of the MIP images confirms the above findings. IMPRESSION: Examination is positive for  multiple subsegmental pulmonary emboli. No evidence of right heart strain. Small bilateral pleural effusions, greater on the right, with basilar atelectasis. Patchy airspace disease in the right lung could represent pneumonia and/or infarcts. These results were called by telephone at the time of interpretation on 02/09/2016 at 11:04 pm to Dr. Renne Crigler , who verbally acknowledged these results. Electronically Signed   By: Burman Nieves M.D.   On: 02/09/2016 23:07   Mr Lumbar Spine W Wo Contrast  02/12/2016  CLINICAL DATA:  Endocarditis.  Follow up discitis. EXAM: MRI LUMBAR SPINE WITHOUT AND WITH CONTRAST TECHNIQUE: Multiplanar and multiecho  pulse sequences of the lumbar spine were obtained without and with intravenous contrast. CONTRAST:  17 ml MultiHance. COMPARISON:  Abdominal pelvic CT 02/09/2016 and 01/09/2016. Lumbar MRI 09/25/2012. FINDINGS: Segmentation: Conventional anatomy assumed, with the last open disc space designated L5-S1. Alignment: Stable mild convex right scoliosis with near anatomic lateral alignment. Bones: The bone marrow is diffusely heterogeneous. Corresponding with the abnormalities demonstrated on recent CT is endplate edema and enhancement at L1-2 suspicious for discitis/osteomyelitis. There is corresponding T2 hyperintensity within the L1-2 disc. Endplate signal changes on the left at L2-3 are associated with some T2 hyperintensity in the L2-3 disc, but are probably degenerative as correlated with older prior studies. Patient also has extensive asymmetric facet arthropathy on the right at L2-3 and L3-4 with subchondral edema and enhancement. These joints may be infected as well. The visualized sacroiliac joints appear unremarkable. Conus medullaris: Extends to the L1 level and appears normal. No abnormal intradural enhancement is seen. However, there is posterior epidural enhancement surrounding a fluid collection extending inferiorly from L3 into the mid sacrum, surrounding a  pre-existing Tarlov cyst. This is best seen on sagittal image number 8 of series 8. This is suspicious for a posterior epidural abscess, supporting the possibility of an infected right-sided facet joint. Paraspinal and other soft tissues: There is diffuse paraspinal edema and enhancement, asymmetric to the right. As seen on recent CT, there are small peripherally enhancing fluid collections within the erector spinae musculature on the right, measuring up to 2 cm in diameter. No focal fluid collection is seen within the psoas musculature. There is stable dilatation of the right renal pelvis. Disc levels: There are stable degenerative changes in the lower thoracic spine without resulting significant spinal stenosis or nerve root encroachment. L1-2: As above, probable findings of diskitis and osteomyelitis. There is stable annular disc bulging, facet and ligamentous hypertrophy contributing to mild mass effect on the thecal sac. No epidural fluid collection identified at this level. L2-3: Chronic degenerative disc disease with disc bulging, osteophytes and endplate degeneration asymmetric to the left. These findings have progressed from the 2013 CT. Disc space infection at this level is difficult to completely exclude, although not strongly suspected. There is asymmetric facet arthropathy on the right with a potentially infected facet joint. Moderate multifactorial spinal stenosis is present. L3-4: Progressive annular disc bulging, facet and ligamentous hypertrophy, contributing to moderate to severe spinal stenosis. There is asymmetric narrowing of the right lateral recess and right foramen. As above, there is potential infection of the right facet joint with a posterior epidural fluid collection extending inferiorly into the sacral canal. L4-5: Chronic degenerative disc disease with loss of disc height and endplate osteophytes. Mild facet and ligamentous hypertrophy. Previously demonstrated right-sided disc extrusion  has partially involuted. There is stable mild spinal stenosis and mild narrowing of the foramina. L5-S1: Disc height and hydration are maintained. No significant spinal stenosis or nerve root encroachment. IMPRESSION: 1. MR findings are supportive of diskitis and osteomyelitis at L1-2, as seen on recent CT. 2. In addition, there is concern of right-sided facet joint infection at L2-3 and L3-4. There is an associated posterior epidural fluid collection with peripheral enhancement extending from L3 into the mid sacrum, suspicious for an epidural abscess related to facet joint infection. This abscess exerts some mass effect on the thecal sac. 3. Grossly stable paraspinal inflammatory changes with small abscesses in the erector spinae musculature on the right. 4. Endplate changes asymmetric to the left at L2-3 are nonspecific and probably discogenic  as correlated with prior CTs. Disc space infection at this level cannot be completely excluded. 5. Progression of multilevel spondylosis from previous MRI with resulting spinal stenosis at multiple levels as detailed above. Electronically Signed   By: Carey Bullocks M.D.   On: 02/12/2016 17:08   Dg Chest Portable 1 View  02/09/2016  CLINICAL DATA:  PICC placement. EXAM: PORTABLE CHEST 1 VIEW COMPARISON:  Chest radiograph 01/06/2016. Included lung bases from CT abdomen/pelvis earlier this day FINDINGS: Tip of the right upper extremity PICC in the proximal SVC. Hazy opacity at the right lung base, pleural effusion and atelectasis as seen on CT. The lungs are otherwise clear. The heart size is normal. There is no pneumothorax. IMPRESSION: Tip of the right upper extremity PICC in the mid proximal SVC. No pneumothorax. Right basilar opacity, pleural effusion and atelectasis. Electronically Signed   By: Rubye Oaks M.D.   On: 02/09/2016 18:50    Labs:  CBC:  Recent Labs  02/11/16 0355 02/11/16 1413 02/12/16 0420 02/13/16 0454  WBC 9.7 10.8* 8.4 9.1  HGB 7.4*  7.6* 8.4* 9.1*  HCT 23.2* 23.8* 26.1* 28.5*  PLT 426* 463* 396 436*    COAGS:  Recent Labs  02/09/16 1947  INR 1.39    BMP:  Recent Labs  02/10/16 1549 02/11/16 0355 02/12/16 0420 02/13/16 0454  NA 136 137 136 136  K 3.1* 3.1* 3.3* 3.3*  CL 101 103 100* 98*  CO2 24 26 24 28   GLUCOSE 104* 101* 119* 95  BUN <5* <5* <5* <5*  CALCIUM 8.4* 8.1* 8.0* 8.2*  CREATININE 0.73 0.71 0.77 0.76  GFRNONAA >60 >60 >60 >60  GFRAA >60 >60 >60 >60    LIVER FUNCTION TESTS:  Recent Labs  02/10/16 1549 02/11/16 0355 02/12/16 0420 02/13/16 0454  BILITOT 1.5* 1.4* 1.7* 1.9*  AST 13* 12* 12* 14*  ALT 7* 9* 8* 8*  ALKPHOS 72 67 67 74  PROT 5.7* 5.4* 5.4* 6.0*  ALBUMIN 1.4* 1.2* 1.3* 1.4*    Assessment and Plan: Hx endocarditis with septic emboli Newly found L1-L2 discitis/osteomyelitis with associated soft tissue inflammation/small abscess. Plan fo image guided aspiration today Need to hold heparin at least an hour, will ACT as well. Ate breakfast but can do this with IV Fentanyl and local anesthesia. Discussed procedure, risks, complications. Consent signed in chart. Heparin can be resumed post-procedure if no complications.  Thank you for this interesting consult.   A copy of this report was sent to the requesting provider on this date.  Electronically Signed: Brayton El 02/13/2016, 10:57 AM   I spent a total of 20 minutes in face to face in clinical consultation, greater than 50% of which was counseling/coordinating care for disc aspiration

## 2016-02-13 NOTE — Care Management Important Message (Signed)
Important Message  Patient Details  Name: Cassandra KeensBetty [name] Date MRN: [id number] Date of Birth: 12/19/1932   Medicare Important Message Given:  Other (see comment)    Cassandra [name] [name] 02/13/2016, 9:52 AM

## 2016-02-13 NOTE — Procedures (Signed)
S/P L1-L2  Fluoro guided disc aspiration .Retrieval of 4 cc of thick bloody fluid obtained and sent for analysis.

## 2016-02-13 NOTE — Consult Note (Addendum)
WOC wound consult note Reason for Consult: Consult requested for left stump wound.  Pt states she is followed by Dr Victorino DikeHewitt as an outpatient and recently had surgery.  She is due for a follow-up appointment later this week to have sutures removed.  He has requested a dry gauze dressing and ace wrap be applied Q day. Wound type: Full thickness post-op incision to left foot stump Wound bed: Sutures intact and well-approximated Drainage (amount, consistency, odor) No odor, drainage, or fluctuance Dressing procedure/placement/frequency: Continue present plan of care with dry gauze and ace wrap. Ortho boot re-applied over the site. Pt can resume follow-up with Dr Victorino DikeHewitt after discharge. Please re-consult if further assistance is needed.  Thank-you,  Cammie Mcgeeawn Samyukta Cura MSN, RN, CWOCN, [place], [facility] [phone]

## 2016-02-13 NOTE — Progress Notes (Signed)
Physical Therapy Treatment Patient Details Name: Cassandra KeensBetty R [name] MRN: [id number] DOB: 01/08/1933 Today's Date: 02/13/2016    History of Present Illness pt presents with R PE, R Pleural Effusion, Endocarditis, and Splenic Infarct.  Pt with recent hx of L Transmetarsal Amputation and PMHx of HTN, PFO, Anxiety, and Depression.      PT Comments    Pt indicates back pain today and self-doubts that she can even stand.  Strong encouragement and pt able to stand and complete pre-gait activities with RW.  Session cut short by MD and pt to go to IR for procedure.  Will continue to follow along.    Follow Up Recommendations  SNF     Equipment Recommendations  None recommended by PT    Recommendations for Other Services       Precautions / Restrictions Precautions Precautions: Fall Required Braces or Orthoses: Other Brace/Splint Other Brace/Splint: Post-op boot for L foot Restrictions Weight Bearing Restrictions: Yes LLE Weight Bearing: Partial weight bearing LLE Partial Weight Bearing Percentage or Pounds: From OT session: per grand-daughter [facility]. She reports the instructions said PWB but did not give a %    Mobility  Bed Mobility               General bed mobility comments: pt sitting in recliner.  Transfers Overall transfer level: Needs assistance Equipment used: Rolling walker (2 wheeled) Transfers: Sit to/from Stand Sit to Stand: Mod assist;+2 physical assistance         General transfer comment: cues for sequencing and encouragement.  pt nervous and self-doubts.  pt with posterior lean needing cueing and facilitation for anterior weight shifting over BOS.  Pre-gait activity with weight shifting and steps in place.    Ambulation/Gait                 Stairs            Wheelchair Mobility    Modified Rankin (Stroke Patients Only)       Balance Overall balance assessment: Needs assistance Sitting-balance support:  Bilateral upper extremity supported;Feet supported Sitting balance-Leahy Scale: Fair   Postural control: Posterior lean Standing balance support: Bilateral upper extremity supported;During functional activity Standing balance-Leahy Scale: Poor                      Cognition Arousal/Alertness: Awake/alert Behavior During Therapy: WFL for tasks assessed/performed Overall Cognitive Status: Within Functional Limits for tasks assessed                      Exercises      General Comments        Pertinent Vitals/Pain Pain Assessment: 0-10 Pain Score: 6  Pain Location: Back Pain Descriptors / Indicators: Aching;Grimacing;Guarding Pain Intervention(s): Monitored during session;Premedicated before session;Repositioned    Home Living                      Prior Function            PT Goals (current goals can now be found in the care plan section) Acute Rehab PT Goals Patient Stated Goal: Walk again. PT Goal Formulation: With patient/family Time For Goal Achievement: 02/24/16 Potential to Achieve Goals: Good Progress towards PT goals: Progressing toward goals    Frequency  Min 3X/week    PT Plan Current plan remains appropriate    Co-evaluation  End of Session Equipment Utilized During Treatment: Gait belt Activity Tolerance: Patient tolerated treatment well Patient left: in chair;with call bell/phone within reach;with family/visitor present     Time: 1610-9604 PT Time Calculation (min) (ACUTE ONLY): 16 min  Charges:  $Therapeutic Activity: 8-22 mins                    G CodesSunny [name], Cassandra [name] [phone] 02/13/2016, 11:09 AM

## 2016-02-13 NOTE — Progress Notes (Signed)
ANTICOAGULATION CONSULT NOTE - Follow Up Consult  Pharmacy Consult for Heparin Indication: pulmonary embolus  Allergies  Allergen Reactions  . Codeine Shortness Of Breath and Nausea And Vomiting  . Hydrocodone Shortness Of Breath and Nausea And Vomiting    Patient Measurements: Height: 5\' 8"  (172.7 cm) Weight: 179 lb 0.2 oz (81.2 kg) IBW/kg (Calculated) : 63.9  Ht 69.5 in  Wt: 81 kg  IBW: 67 kg Heparin Dosing Weight: 81 kg  Vital Signs: Temp: 98.2 F (36.8 C) (03/13 0400) Temp Source: Oral (03/13 0400) BP: 154/73 mmHg (03/13 0600) Pulse Rate: 81 (03/13 0700)  Labs:  Recent Labs  02/11/16 0355 02/11/16 0406 02/11/16 1413 02/12/16 0420 02/13/16 0445 02/13/16 0454  HGB 7.4*  --  7.6* 8.4*  --  9.1*  HCT 23.2*  --  23.8* 26.1*  --  28.5*  PLT 426*  --  463* 396  --  436*  HEPARINUNFRC  --  0.56  --  0.38 0.51  --   CREATININE 0.71  --   --  0.77  --  0.76    Estimated Creatinine Clearance: 62.7 mL/min (by C-G formula based on Cr of 0.76).   Medical History: Past Medical History  Diagnosis Date  . Hypertension   . Arthritis   . Depression   . Hyperlipidemia   . PFO (patent foramen ovale)     small  . H/O echocardiogram 2008    prolapse of post. mitral leaflet, tr MR, tr TR, EF >55%, mild concentric LVH  . H/O cardiovascular stress test 10/13/2007    low risk scan, no changes from previous test  . Heart palpitations     controlled on BB  . Hiatal hernia   . Cellulitis and abscess of toe of left foot 09/16/2014    Assessment: 82 y.o. F presents from [facility] with flank pain x multiple months. CT scan positive for multiple subsegmental pulmonary emboli. No evidence of right heart strain. Currently therapeutic on IV heparin at 1300 units/hr for PE. Hgb 9.1 stable, Plt wnl. No s/s of bleeding reported. Neurosurgery does not recommend surgical intervention. May transition to Eliquis soon per MD notes  Goal of Therapy:  Heparin level 0.3-0.7  units/ml Monitor platelets by anticoagulation protocol: Yes   Plan:  Continue heparin gtt at 1300 units/hr Daily heparin level and CBC [facility]  Babs BertinHaley Shareese Macha, PharmD, [facility] CenterBCPS Clinical Pharmacist Pager [phone] 02/13/2016 7:23 AM

## 2016-02-13 NOTE — Progress Notes (Signed)
Patient ID: Cassandra [name], female   DOB: June 17, 1933, 82 y.o.   MRN: [id number]         [facility]    Date of Admission:  02/09/2016    Total days of antibiotics 43        Day 36 nafcillin since first negative blood culture        Day 2 rifampin        Day 2 ceftriaxone  Principal Problem:   Acute pulmonary embolism (HCC) Active Problems:   Hypertension   Hyperlipidemia   PFO (patent foramen ovale)   Cerebral embolism with cerebral infarction   Bacterial endocarditis   Chronic osteomyelitis of left foot (HCC)   S/P amputation of foot (HCC)   GERD (gastroesophageal reflux disease)   PE (pulmonary embolism)   . sodium chloride   Intravenous Once  . aspirin  81 mg Oral Daily  . atenolol  100 mg Oral Daily  . brimonidine  1 drop Both Eyes TID  . brinzolamide  1 drop Both Eyes TID  . bupivacaine (PF)      . calcium-vitamin D  1 tablet Oral Daily  . cefTRIAXone (ROCEPHIN)  IV  1 g Intravenous Q24H  . ciprofloxacin-dexamethasone  4 drop Right Ear BID  . famotidine  40 mg Oral Daily  . feeding supplement (ENSURE ENLIVE)  237 mL Oral BID BM  . fentaNYL      . fluticasone  2 spray Each Nare Daily  . furosemide  40 mg Intravenous Daily  . isosorbide mononitrate  30 mg Oral Daily  . latanoprost  1 drop Both Eyes QHS  . losartan  100 mg Oral Daily  . multivitamin  1 tablet Oral BID  . nafcillin IVPB 2 gram/100 mL D5W (Pyxis)  2 g Intravenous Q4H  . nystatin  1 Bottle Topical BID  . pantoprazole  40 mg Oral BID  . rifampin  300 mg Oral Q12H  . saccharomyces boulardii  250 mg Oral BID  . sertraline  100 mg Oral Daily  . simvastatin  10 mg Oral QHS  . sodium chloride flush  3 mL Intravenous Q12H  . traMADol  50 mg Oral TID    SUBJECTIVE: She's been having severe left-sided back pain since just before Christmas. She also has kidney stones and it was felt like that was probably the cause until she was found to have L1-2 discitis with associated  abscess on a recent CT scan.  Review of Systems: Review of Systems  Constitutional: Positive for malaise/fatigue. Negative for fever, chills, weight loss and diaphoresis.  HENT: Negative for sore throat.   Respiratory: Negative for cough, sputum production and shortness of breath.   Cardiovascular: Negative for chest pain.  Gastrointestinal: Negative for nausea, vomiting and diarrhea.  Genitourinary:       She's had a Foley catheter in since she was hospitalized in January. She has failed 3 separate voiding trials.  Musculoskeletal: Positive for back pain. Negative for myalgias and joint pain.  Skin: Negative for rash.  Neurological: Negative for focal weakness and headaches.    Past Medical History  Diagnosis Date  . Hypertension   . Arthritis   . Depression   . Hyperlipidemia   . PFO (patent foramen ovale)     small  . H/O echocardiogram 2008    prolapse of post. mitral leaflet, tr MR, tr TR, EF >55%, mild concentric LVH  . H/O cardiovascular stress test 10/13/2007    low  risk scan, no changes from previous test  . Heart palpitations     controlled on BB  . Hiatal hernia   . Cellulitis and abscess of toe of left foot 09/16/2014    Social History  Substance Use Topics  . Smoking status: Never Smoker   . Smokeless tobacco: Never Used  . Alcohol Use: No    Family History  Problem Relation Age of Onset  . Heart disease Mother   . Cancer Father   . Kidney disease Brother   . Hypertension Daughter   . Hyperlipidemia Son    Allergies  Allergen Reactions  . Codeine Shortness Of Breath and Nausea And Vomiting  . Hydrocodone Shortness Of Breath and Nausea And Vomiting    OBJECTIVE: Filed Vitals:   02/13/16 1236 02/13/16 1240 02/13/16 1315 02/13/16 1400  BP: 153/82 146/91 127/81 145/78  Pulse: 82 82 80 78  Temp:  98.3 F (36.8 C)    TempSrc:  Oral    Resp: 14 17 11 15   Height:      Weight:      SpO2: 99% 98% 95% 95%   Body mass index is 27.23  kg/(m^2).  Physical Exam  Constitutional: She is oriented to person, place, and time.  She is alert and in good spirits visiting with family.  Eyes: Conjunctivae are normal.  Cardiovascular: Normal rate and regular rhythm.   No murmur heard. Pulmonary/Chest: Breath sounds normal.  Abdominal: Soft. There is no tenderness.  Neurological: She is alert and oriented to person, place, and time.  Skin: No rash noted.  New right arm PICC.  Psychiatric: Mood and affect normal.    Lab Results Lab Results  Component Value Date   WBC 9.1 02/13/2016   HGB 9.1* 02/13/2016   HCT 28.5* 02/13/2016   MCV 88.2 02/13/2016   PLT 436* 02/13/2016    Lab Results  Component Value Date   CREATININE 0.76 02/13/2016   BUN <5* 02/13/2016   NA 136 02/13/2016   K 3.3* 02/13/2016   CL 98* 02/13/2016   CO2 28 02/13/2016    Lab Results  Component Value Date   ALT 8* 02/13/2016   AST 14* 02/13/2016   ALKPHOS 74 02/13/2016   BILITOT 1.9* 02/13/2016     Microbiology: Recent Results (from the past 240 hour(s))  Culture, blood (Routine X 2) w Reflex to ID Panel     Status: None (Preliminary result)   Collection Time: 02/09/16  7:37 PM  Result Value Ref Range Status   Specimen Description BLOOD LEFT ANTECUBITAL  Final   Special Requests IN PEDIATRIC BOTTLE 1CC  Final   Culture NO GROWTH 4 DAYS  Final   Report Status PENDING  Incomplete  Culture, blood (Routine X 2) w Reflex to ID Panel     Status: None (Preliminary result)   Collection Time: 02/09/16  7:47 PM  Result Value Ref Range Status   Specimen Description BLOOD RIGHT HAND  Final   Special Requests IN PEDIATRIC BOTTLE 1CC  Final   Culture NO GROWTH 4 DAYS  Final   Report Status PENDING  Incomplete  Urine culture     Status: None   Collection Time: 02/10/16  1:40 AM  Result Value Ref Range Status   Specimen Description URINE, CATHETERIZED  Final   Special Requests Normal  Final   Culture >=100,000 COLONIES/mL ESCHERICHIA COLI  Final    Report Status 02/12/2016 FINAL  Final   Organism ID, Bacteria ESCHERICHIA COLI  Final  Susceptibility   Escherichia coli - MIC*    AMPICILLIN >=32 RESISTANT Resistant     CEFAZOLIN >=64 RESISTANT Resistant     CEFTRIAXONE <=1 SENSITIVE Sensitive     CIPROFLOXACIN >=4 RESISTANT Resistant     GENTAMICIN <=1 SENSITIVE Sensitive     IMIPENEM <=0.25 SENSITIVE Sensitive     NITROFURANTOIN <=16 SENSITIVE Sensitive     TRIMETH/SULFA 160 RESISTANT Resistant     AMPICILLIN/SULBACTAM >=32 RESISTANT Resistant     PIP/TAZO >=128 RESISTANT Resistant     * >=100,000 COLONIES/mL ESCHERICHIA COLI  MRSA PCR Screening     Status: None   Collection Time: 02/10/16  5:10 AM  Result Value Ref Range Status   MRSA by PCR NEGATIVE NEGATIVE Final    Comment:        The GeneXpert MRSA Assay (FDA approved for NASAL specimens only), is one component of a comprehensive MRSA colonization surveillance program. It is not intended to diagnose MRSA infection nor to guide or monitor treatment for MRSA infections.      ASSESSMENT: She has lumbar infection with an epidural abscess complicating her already very complicated systemic MSSA infection. I will continue nafcillin and rifampin. I suspect that the Escherichia coli in her recent urine culture is a colonizer and will stop ceftriaxone now.  PLAN: 1. Continue nafcillin and rifampin 2. Await Gram stain and culture results from lumbar aspirate 3. Discontinue ceftriaxone  Cliffton Asters, MD [facility] [phone] pager   [phone] cell 02/13/2016, 3:38 PM

## 2016-02-13 NOTE — Progress Notes (Signed)
PROGRESS NOTE  Cassandra [name] ZOX:[id number] DOB: July 27, 1933 DOA: 02/09/2016 PCP: Colette Ribas, MD  HPI/Recap of past 24 hours:  On heparin drip, c/o left lower back pain, c/o right sided ear pain, right sided neck tenderness, no hearing impairment, no dizziness, some nausea, no vomiting, on room air, denies sob, very pleasant Indwelling foley with clear urine. No family in room  Assessment/Plan: Principal Problem:   Acute pulmonary embolism (HCC) Active Problems:   Hypertension   Hyperlipidemia   PFO (patent foramen ovale)   Cerebral embolism with cerebral infarction   Bacterial endocarditis   Chronic osteomyelitis of left foot (HCC)   S/P amputation of foot (HCC)   GERD (gastroesophageal reflux disease)   PE (pulmonary embolism)  Acute PE and left lower extremity  DVT: continue heparin drip, tachycardia has resolved, now hemodynamically stable, repeat echo right ventricle systolic function wnl, lvef 65-75%, NO WALL MOTION abnormalities. No obvious vegetation. May need to keep on heparin drip for now , due to concerning of paraspinal abscess and possible surgical intervention. Consider transition to elliquis likely on Tuesday.  Pleural effusion: small right ? Left, from PE? Also has low albumin, anasarca, Does has grade i diastolic dysfunction on echo, mild pitting edema on right leg, more edema on left leg (+DVt left leg), on room air,  Lasix  after prbc x1 unit of 3/11. Start lasix  iv qd. Leg edema better, echo pending  H/o Urinary retention, CT scan from 01/10/2016 showing bilateral hydronephrosis without evidence of distal obstruction. -A Foley catheter was placed from last hospitalization, due to failure of voiding trial, she is discharged to [facility] with foley, patient reported she was evaluated by urology Dr Patsi Sears this week prior to being admitted to the hospital, she is advised to continue foley.  Talked Dr urology Dr Isabel Caprice, he recommended to keep the foley  in and follow up with Dr Ollen Gross in a month from 2/21 ( last time has foley changed), he will arrange follow up.  ua from 3/10 on presentation during this hospitalization + bacteriuria , + yeast, culture pending, no fever, no leukocytosis, started rocephin per ID recommendation.    Discitis/ l1-l2/right paraspinal abscess? Reported on outpatient CT ab/pel, I have discussed with radiology Dr Molli Posey  Who recommended MRI lumber spine w/wo contrast, if confirmed will need to call neurosurgery. Infectious disease aware as well.  S/P L1-L2 Fluoro guided disc aspiration  On 3/13, now on nafcillin/rifampin  Endocarditis: Evidenced by blood cultures from 12/31/2015 growing MSSA. She is currently on Nafcillin 2 g IV every 4 hours per Dr. Erich Montane recommendation, supposed to be treated until 02/20/16. Source of bacteremia felt to be left foot osteomyelitis. Pt is s/p of transmetatarsal amputation of left foot osteo.Repeat blood cultures from 01/06/2016 showing no growth to date. Per discharge summary, CVTS will reassess after antibiotics therapy completed to decide at that time needs for surgery/valvular repair.  -continue IV nafcillin -Repeat blood culture no growth so far  Chronic left foot wound/osteomyelitis . MRI of foot from 01/03/2016 reported by radiology to show possible underlying osteomyelitis, cannot rule out underlying abscess. She underwent transmetatarsal amputation of left foot for chronic osteomyelitis.  -continue IV nafcillin -outpatient follow up with orthopedic service as an outpatient   Splenic infarct. Patient complaining of abdominal pain on 01/09/2016, further worked up with CT scan of abdomen and pelvis with IV contrast, study revealed splenic infarct involving 30%, likely secondary to septic emboli.  -Continue IV antibiotics as above   Normocytic  Anemia: likely anemia of chronic disease: hgb has been fluctuating ranging from 7.6 to 10 since 01/2016, she received two prbc  during last hospitalization.  stool negative for occult blood, urine no gross hematuria, no obvious bleeding, bp stable, patient just had ct ab on 3/9, no retroperitoneal hematoma, anemia work up showed inappropriate low reticulocytes , otherwise unremarkable. Transfuse 1prbc on 3/11. Appropriate increase of hgb post transfusion.   HTN; -Atenolol, losartan, imdur, lasix  HLD: Last LDL was 71 on 01/06/16 -Continue home medications: Zocor  Depression and anxiety: Stable, no suicidal or homicidal ideations. -Continue home medications  DVT ppx: on IV heparin   Code Status: full  Family Communication: patient   Disposition Plan: transfer to tele on heparin drip, consider transition to NOAC on tuesday   Consultants:  pccm phone conversation by admitting MD DR Clyde Lundborg  Infectious disease  Neurosurgery  IR  Urology over the phone on 3/13  Procedures: S/P L1-L2 Fluoro guided disc aspiration on 3/13  Antibiotics:  nafacillin  Rocephin  rifampin    Objective: BP 127/81 mmHg  Pulse 80  Temp(Src) 98.3 F (36.8 C) (Oral)  Resp 11  Ht 5\' 8"  (1.727 m)  Wt 81.2 kg (179 lb 0.2 oz)  BMI 27.23 kg/m2  SpO2 95%  Intake/Output Summary (Last 24 hours) at 02/13/16 1346 Last data filed at 02/13/16 1000  Gross per 24 hour  Intake   1443 ml  Output   4525 ml  Net  -3082 ml   Filed Weights   02/10/16 0522  Weight: 81.2 kg (179 lb 0.2 oz)    Exam:   General:  NAD  Cardiovascular: RRR  Respiratory: diminished at basis, no rales, no rhonchi, no wheezing  Abdomen: Soft/ND/NT, positive BS, indwelling foley   Musculoskeletal: left foot post op changes /dressing intact, bilateral lower extremity edema, left >right, improving,   Neuro: aaox3, subtle right hand weakness which is improving per patient  Data Reviewed: Basic Metabolic Panel:  Recent Labs Lab 02/10/16 0344 02/10/16 1549 02/11/16 0355 02/12/16 0420 02/13/16 0454  NA 136 136 137 136 136  K 3.5 3.1*  3.1* 3.3* 3.3*  CL 102 101 103 100* 98*  CO2 21* 24 26 24 28   GLUCOSE 133* 104* 101* 119* 95  BUN <5* <5* <5* <5* <5*  CREATININE 0.65 0.73 0.71 0.77 0.76  CALCIUM 8.2* 8.4* 8.1* 8.0* 8.2*  MG 1.8  --  1.7 2.1  --    Liver Function Tests:  Recent Labs Lab 02/09/16 1947 02/10/16 1549 02/11/16 0355 02/12/16 0420 02/13/16 0454  AST 12* 13* 12* 12* 14*  ALT 7* 7* 9* 8* 8*  ALKPHOS 79 72 67 67 74  BILITOT 1.4* 1.5* 1.4* 1.7* 1.9*  PROT 6.3* 5.7* 5.4* 5.4* 6.0*  ALBUMIN 1.5* 1.4* 1.2* 1.3* 1.4*   No results for input(s): LIPASE, AMYLASE in the last 168 hours. No results for input(s): AMMONIA in the last 168 hours. CBC:  Recent Labs Lab 02/08/16 1720 02/09/16 1947 02/10/16 0344 02/11/16 0355 02/11/16 1413 02/12/16 0420 02/13/16 0454  WBC 12.8* 11.6* 10.1 9.7 10.8* 8.4 9.1  NEUTROABS 8.7* 7.4  --   --   --   --   --   HGB 8.0* 8.3* 10.7* 7.4* 7.6* 8.4* 9.1*  HCT 24.9* 25.6* 33.5* 23.2* 23.8* 26.1* 28.5*  MCV 88.9 88.0 87.2 88.2 88.5 87.6 88.2  PLT 461* 447* 385 426* 463* 396 436*   Cardiac Enzymes:   No results for input(s): CKTOTAL, CKMB, CKMBINDEX, TROPONINI  in the last 168 hours. BNP (last 3 results)  Recent Labs  01/01/16 1142  BNP 432.0*    ProBNP (last 3 results) No results for input(s): PROBNP in the last 8760 hours.  CBG:  Recent Labs Lab 02/10/16 0722 02/11/16 0731 02/12/16 0743 02/13/16 0756  GLUCAP 109* 99 81 73    Recent Results (from the past 240 hour(s))  Culture, blood (Routine X 2) w Reflex to ID Panel     Status: None (Preliminary result)   Collection Time: 02/09/16  7:37 PM  Result Value Ref Range Status   Specimen Description BLOOD LEFT ANTECUBITAL  Final   Special Requests IN PEDIATRIC BOTTLE 1CC  Final   Culture NO GROWTH 4 DAYS  Final   Report Status PENDING  Incomplete  Culture, blood (Routine X 2) w Reflex to ID Panel     Status: None (Preliminary result)   Collection Time: 02/09/16  7:47 PM  Result Value Ref Range Status     Specimen Description BLOOD RIGHT HAND  Final   Special Requests IN PEDIATRIC BOTTLE 1CC  Final   Culture NO GROWTH 4 DAYS  Final   Report Status PENDING  Incomplete  Urine culture     Status: None   Collection Time: 02/10/16  1:40 AM  Result Value Ref Range Status   Specimen Description URINE, CATHETERIZED  Final   Special Requests Normal  Final   Culture >=100,000 COLONIES/mL ESCHERICHIA COLI  Final   Report Status 02/12/2016 FINAL  Final   Organism ID, Bacteria ESCHERICHIA COLI  Final      Susceptibility   Escherichia coli - MIC*    AMPICILLIN >=32 RESISTANT Resistant     CEFAZOLIN >=64 RESISTANT Resistant     CEFTRIAXONE <=1 SENSITIVE Sensitive     CIPROFLOXACIN >=4 RESISTANT Resistant     GENTAMICIN <=1 SENSITIVE Sensitive     IMIPENEM <=0.25 SENSITIVE Sensitive     NITROFURANTOIN <=16 SENSITIVE Sensitive     TRIMETH/SULFA 160 RESISTANT Resistant     AMPICILLIN/SULBACTAM >=32 RESISTANT Resistant     PIP/TAZO >=128 RESISTANT Resistant     * >=100,000 COLONIES/mL ESCHERICHIA COLI  MRSA PCR Screening     Status: None   Collection Time: 02/10/16  5:10 AM  Result Value Ref Range Status   MRSA by PCR NEGATIVE NEGATIVE Final    Comment:        The GeneXpert MRSA Assay (FDA approved for NASAL specimens only), is one component of a comprehensive MRSA colonization surveillance program. It is not intended to diagnose MRSA infection nor to guide or monitor treatment for MRSA infections.      Studies: Mr Lumbar Spine W Wo Contrast  02/12/2016  CLINICAL DATA:  Endocarditis.  Follow up discitis. EXAM: MRI LUMBAR SPINE WITHOUT AND WITH CONTRAST TECHNIQUE: Multiplanar and multiecho pulse sequences of the lumbar spine were obtained without and with intravenous contrast. CONTRAST:  17 ml MultiHance. COMPARISON:  Abdominal pelvic CT 02/09/2016 and 01/09/2016. Lumbar MRI 09/25/2012. FINDINGS: Segmentation: Conventional anatomy assumed, with the last open disc space designated L5-S1.  Alignment: Stable mild convex right scoliosis with near anatomic lateral alignment. Bones: The bone marrow is diffusely heterogeneous. Corresponding with the abnormalities demonstrated on recent CT is endplate edema and enhancement at L1-2 suspicious for discitis/osteomyelitis. There is corresponding T2 hyperintensity within the L1-2 disc. Endplate signal changes on the left at L2-3 are associated with some T2 hyperintensity in the L2-3 disc, but are probably degenerative as correlated with older prior studies. Patient also  has extensive asymmetric facet arthropathy on the right at L2-3 and L3-4 with subchondral edema and enhancement. These joints may be infected as well. The visualized sacroiliac joints appear unremarkable. Conus medullaris: Extends to the L1 level and appears normal. No abnormal intradural enhancement is seen. However, there is posterior epidural enhancement surrounding a fluid collection extending inferiorly from L3 into the mid sacrum, surrounding a pre-existing Tarlov cyst. This is best seen on sagittal image number 8 of series 8. This is suspicious for a posterior epidural abscess, supporting the possibility of an infected right-sided facet joint. Paraspinal and other soft tissues: There is diffuse paraspinal edema and enhancement, asymmetric to the right. As seen on recent CT, there are small peripherally enhancing fluid collections within the erector spinae musculature on the right, measuring up to 2 cm in diameter. No focal fluid collection is seen within the psoas musculature. There is stable dilatation of the right renal pelvis. Disc levels: There are stable degenerative changes in the lower thoracic spine without resulting significant spinal stenosis or nerve root encroachment. L1-2: As above, probable findings of diskitis and osteomyelitis. There is stable annular disc bulging, facet and ligamentous hypertrophy contributing to mild mass effect on the thecal sac. No epidural fluid  collection identified at this level. L2-3: Chronic degenerative disc disease with disc bulging, osteophytes and endplate degeneration asymmetric to the left. These findings have progressed from the 2013 CT. Disc space infection at this level is difficult to completely exclude, although not strongly suspected. There is asymmetric facet arthropathy on the right with a potentially infected facet joint. Moderate multifactorial spinal stenosis is present. L3-4: Progressive annular disc bulging, facet and ligamentous hypertrophy, contributing to moderate to severe spinal stenosis. There is asymmetric narrowing of the right lateral recess and right foramen. As above, there is potential infection of the right facet joint with a posterior epidural fluid collection extending inferiorly into the sacral canal. L4-5: Chronic degenerative disc disease with loss of disc height and endplate osteophytes. Mild facet and ligamentous hypertrophy. Previously demonstrated right-sided disc extrusion has partially involuted. There is stable mild spinal stenosis and mild narrowing of the foramina. L5-S1: Disc height and hydration are maintained. No significant spinal stenosis or nerve root encroachment. IMPRESSION: 1. MR findings are supportive of diskitis and osteomyelitis at L1-2, as seen on recent CT. 2. In addition, there is concern of right-sided facet joint infection at L2-3 and L3-4. There is an associated posterior epidural fluid collection with peripheral enhancement extending from L3 into the mid sacrum, suspicious for an epidural abscess related to facet joint infection. This abscess exerts some mass effect on the thecal sac. 3. Grossly stable paraspinal inflammatory changes with small abscesses in the erector spinae musculature on the right. 4. Endplate changes asymmetric to the left at L2-3 are nonspecific and probably discogenic as correlated with prior CTs. Disc space infection at this level cannot be completely excluded. 5.  Progression of multilevel spondylosis from previous MRI with resulting spinal stenosis at multiple levels as detailed above. Electronically Signed   By: Carey Bullocks M.D.   On: 02/12/2016 17:08    Scheduled Meds: . sodium chloride   Intravenous Once  . aspirin  81 mg Oral Daily  . atenolol  100 mg Oral Daily  . brimonidine  1 drop Both Eyes TID  . brinzolamide  1 drop Both Eyes TID  . bupivacaine (PF)      . calcium-vitamin D  1 tablet Oral Daily  . cefTRIAXone (ROCEPHIN)  IV  1 g Intravenous Q24H  . ciprofloxacin-dexamethasone  4 drop Right Ear BID  . famotidine  40 mg Oral Daily  . feeding supplement (ENSURE ENLIVE)  237 mL Oral BID BM  . fentaNYL      . fluticasone  2 spray Each Nare Daily  . furosemide  40 mg Intravenous Daily  . isosorbide mononitrate  30 mg Oral Daily  . latanoprost  1 drop Both Eyes QHS  . losartan  100 mg Oral Daily  . multivitamin  1 tablet Oral BID  . nafcillin IVPB 2 gram/100 mL D5W (Pyxis)  2 g Intravenous Q4H  . nystatin  1 Bottle Topical BID  . pantoprazole  40 mg Oral BID  . rifampin  300 mg Oral Q12H  . saccharomyces boulardii  250 mg Oral BID  . sertraline  100 mg Oral Daily  . simvastatin  10 mg Oral QHS  . sodium chloride flush  3 mL Intravenous Q12H  . traMADol  50 mg Oral TID    Continuous Infusions: . heparin 1,300 Units (02/13/16 1315)     Time spent: 35mins  Christobal Morado MD, PhD  [facility] Pager [phone]. If 7PM-7AM, please contact night-coverage at [email or web address], password [email or web address] 02/13/2016, 1:46 PM  LOS: 3 days

## 2016-02-13 NOTE — [place] FL2 (Signed)
[place] MEDICAID FL2 LEVEL OF CARE SCREENING TOOL     IDENTIFICATION  Patient Name: Cassandra [name] Birthdate: Feb 08, 1933 Sex: female Admission Date (Current Location): 02/09/2016  [facility] and [place] Number:  Producer, television/film/video and Address:  [facility], [address] [place], [place] [address]      Provider Number: [id number]  Attending Physician Name and Address:  Albertine Grates, MD  Relative Name and Phone Number:       Current Level of Care: Hospital Recommended Level of Care: Skilled Nursing Facility Prior Approval Number:    Date Approved/Denied:   PASRR Number:   [id number]   Discharge Plan: SNF    Current Diagnoses: Patient Active Problem List   Diagnosis Date Noted  . Discitis of lumbar region   . Acute pulmonary embolism (HCC) 02/10/2016  . PE (pulmonary embolism) 02/10/2016  . GERD (gastroesophageal reflux disease) 02/09/2016  . Altered mental status   . Open wound of left foot   . S/P amputation of foot (HCC)   . Chest tightness 01/08/2016  . Chest pain   . Shortness of breath   . Loose stools   . Chronic osteomyelitis of left foot (HCC)   . Cerebral embolism with cerebral infarction 01/06/2016  . Bacterial endocarditis   . Sepsis (HCC) 01/05/2016  . Bacteremia   . Diminished pulses in lower extremity   . Emesis   . Staphylococcus aureus bacteremia 01/01/2016  . Gastroenteritis 12/29/2015  . Glaucoma 12/29/2015  . Normocytic anemia 09/17/2014  . Septic arthritis of interphalangeal joint of toe of left foot (HCC) 09/16/2014  . Cellulitis and abscess of toe of left foot 09/16/2014  . Osteomyelitis of toe of left foot (HCC) 09/16/2014  . Hypertension   . Hyperlipidemia   . PFO (patent foramen ovale)   . H/O echocardiogram   . Heart palpitations     Orientation RESPIRATION BLADDER Height & Weight     Self, Time, Situation, Place  Normal Indwelling catheter Weight: 179 lb 0.2 oz (81.2 kg) Height:   (172.7 cm)   BEHAVIORAL SYMPTOMS/MOOD NEUROLOGICAL BOWEL NUTRITION STATUS      Continent Diet (Heart Healthy)  AMBULATORY STATUS COMMUNICATION OF NEEDS Skin   Extensive Assist Verbally Normal                       Personal Care Assistance Level of Assistance  Bathing, Feeding, Dressing Bathing Assistance: Limited assistance Feeding assistance: Independent Dressing Assistance: Limited assistance     Functional Limitations Info  Sight, Hearing, Speech Sight Info: Adequate Hearing Info: Adequate Speech Info: Adequate    SPECIAL CARE FACTORS FREQUENCY  PT (By licensed PT), OT (By licensed OT)     PT Frequency: 3 OT Frequency: 3            Contractures Contractures Info: Not present    Additional Factors Info  Code Status, Allergies, Psychotropic Code Status Info: Full Code Allergies Info: Codeine, Hydrocodone Psychotropic Info: Zoloft         Current Medications (02/13/2016):  This is the current hospital active medication list Current Facility-Administered Medications  Medication Dose Route Frequency Provider Last Rate Last Dose  . 0.9 %  sodium chloride infusion   Intravenous Once Albertine Grates, MD      . acetaminophen (TYLENOL) tablet 650 mg  650 mg Oral Q6H PRN Lorretta Harp, MD       Or  . acetaminophen (TYLENOL) suppository 650 mg  650 mg  Rectal Q6H PRN Lorretta Harp, MD      . aspirin chewable tablet 81 mg  81 mg Oral Daily Lorretta Harp, MD   81 mg at 02/13/16 0955  . atenolol (TENORMIN) tablet 100 mg  100 mg Oral Daily Lorretta Harp, MD   100 mg at 02/13/16 0956  . brimonidine (ALPHAGAN) 0.2 % ophthalmic solution 1 drop  1 drop Both Eyes TID Lorretta Harp, MD   1 drop at 02/13/16 1000  . brinzolamide (AZOPT) 1 % ophthalmic suspension 1 drop  1 drop Both Eyes TID Lorretta Harp, MD   1 drop at 02/13/16 1000  . bupivacaine (PF) (MARCAINE) 0.25 % injection           . calcium-vitamin D (OSCAL WITH D) 500-200 MG-UNIT per tablet 1 tablet  1 tablet Oral Daily Lorretta Harp, MD   1 tablet at 02/13/16 0956   . cefTRIAXone (ROCEPHIN) 1 g in dextrose 5 % 50 mL IVPB  1 g Intravenous Q24H Ginnie Smart, MD   1 g at 02/12/16 1805  . ciprofloxacin-dexamethasone (CIPRODEX) 0.3-0.1 % otic suspension 4 drop  4 drop Right Ear BID Albertine Grates, MD   4 drop at 02/13/16 1328  . famotidine (PEPCID) tablet 40 mg  40 mg Oral Daily Lorretta Harp, MD   40 mg at 02/13/16 0956  . feeding supplement (ENSURE ENLIVE) (ENSURE ENLIVE) liquid 237 mL  237 mL Oral BID BM Lorretta Harp, MD   237 mL at 02/13/16 0957  . fentaNYL (SUBLIMAZE) 100 MCG/2ML injection           . fluticasone (FLONASE) 50 MCG/ACT nasal spray 2 spray  2 spray Each Nare Daily Albertine Grates, MD      . furosemide (LASIX) injection 40 mg  40 mg Intravenous Daily Albertine Grates, MD   40 mg at 02/13/16 1002  . gadobenate dimeglumine (MULTIHANCE) injection 17 mL  17 mL Intravenous Once PRN Albertine Grates, MD      . heparin ADULT infusion 100 units/mL (25000 units/250 mL)  1,300 Units/hr Intravenous Continuous Signe Colt, RPH 13 mL/hr at 02/13/16 1500 1,300 Units at 02/13/16 1500  . HYDROmorphone (DILAUDID) tablet 1 mg  1 mg Oral Q4H PRN Lorretta Harp, MD      . isosorbide mononitrate (IMDUR) 24 hr tablet 30 mg  30 mg Oral Daily Lorretta Harp, MD   30 mg at 02/13/16 0956  . latanoprost (XALATAN) 0.005 % ophthalmic solution 1 drop  1 drop Both Eyes QHS Lorretta Harp, MD   1 drop at 02/12/16 2129  . losartan (COZAAR) tablet 100 mg  100 mg Oral Daily Lorretta Harp, MD   100 mg at 02/13/16 0956  . multivitamin (PROSIGHT) tablet 1 tablet  1 tablet Oral BID Lorretta Harp, MD   1 tablet at 02/13/16 0956  . nafcillin 2 g in dextrose 5 % 100 mL IVPB  2 g Intravenous Q4H Lorretta Harp, MD   2 g at 02/13/16 1326  . nystatin (MYCOSTATIN/NYSTOP) topical powder 1 Bottle  1 Bottle Topical BID Lorretta Harp, MD   1 Bottle at 02/13/16 [id number]  . ondansetron (ZOFRAN-ODT) disintegrating tablet 4 mg  4 mg Oral Q8H PRN Lorretta Harp, MD   4 mg at 02/11/16 2211  . pantoprazole (PROTONIX) EC tablet 40 mg  40 mg Oral BID Lorretta Harp, MD   40 mg at  02/13/16 0956  . polyvinyl alcohol (LIQUIFILM TEARS) 1.4 % ophthalmic solution 1 drop  1 drop Both Eyes TID PRN Brien Few  Clyde LundborgNiu, MD      . rifampin (RIFADIN) capsule 300 mg  300 mg Oral Q12H Ginnie SmartJeffrey C Hatcher, MD   300 mg at 02/13/16 0956  . saccharomyces boulardii (FLORASTOR) capsule 250 mg  250 mg Oral BID Lorretta HarpXilin Niu, MD   250 mg at 02/13/16 0956  . sertraline (ZOLOFT) tablet 100 mg  100 mg Oral Daily Lorretta HarpXilin Niu, MD   100 mg at 02/13/16 0956  . simvastatin (ZOCOR) tablet 10 mg  10 mg Oral QHS Lorretta HarpXilin Niu, MD   10 mg at 02/12/16 2131  . sodium chloride flush (NS) 0.9 % injection 10-40 mL  10-40 mL Intracatheter PRN Zadie Rhineonald Wickline, MD      . sodium chloride flush (NS) 0.9 % injection 3 mL  3 mL Intravenous Q12H Lorretta HarpXilin Niu, MD   3 mL at 02/13/16 1000  . traMADol (ULTRAM) tablet 50 mg  50 mg Oral TID Lorretta HarpXilin Niu, MD   50 mg at 02/13/16 [id number]     Discharge Medications: Please see discharge summary for a list of discharge medications.  Relevant Imaging Results:  Relevant Lab Results:   Additional Information SSN [id number]  Macario GoldsJesse Scinto, [place] [phone]

## 2016-02-14 ENCOUNTER — Inpatient Hospital Stay (HOSPITAL_COMMUNITY): Payer: Medicare Other

## 2016-02-14 DIAGNOSIS — I2699 Other pulmonary embolism without acute cor pulmonale: Secondary | ICD-10-CM

## 2016-02-14 LAB — ACID FAST SMEAR (AFB): ACID FAST SMEAR - AFSCU2: NEGATIVE

## 2016-02-14 LAB — ECHOCARDIOGRAM COMPLETE
Height: 68 in
Weight: 2864.22 oz

## 2016-02-14 LAB — COMPREHENSIVE METABOLIC PANEL
ALBUMIN: 1.5 g/dL — AB (ref 3.5–5.0)
ALK PHOS: 81 U/L (ref 38–126)
ALT: 9 U/L — ABNORMAL LOW (ref 14–54)
ANION GAP: 12 (ref 5–15)
AST: 18 U/L (ref 15–41)
BILIRUBIN TOTAL: 2.1 mg/dL — AB (ref 0.3–1.2)
BUN: 7 mg/dL (ref 6–20)
CALCIUM: 8.6 mg/dL — AB (ref 8.9–10.3)
CO2: 28 mmol/L (ref 22–32)
Chloride: 96 mmol/L — ABNORMAL LOW (ref 101–111)
Creatinine, Ser: 0.79 mg/dL (ref 0.44–1.00)
GLUCOSE: 107 mg/dL — AB (ref 65–99)
Potassium: 3.3 mmol/L — ABNORMAL LOW (ref 3.5–5.1)
Sodium: 136 mmol/L (ref 135–145)
TOTAL PROTEIN: 6 g/dL — AB (ref 6.5–8.1)

## 2016-02-14 LAB — CBC
HEMATOCRIT: 29.2 % — AB (ref 36.0–46.0)
HEMOGLOBIN: 9.3 g/dL — AB (ref 12.0–15.0)
MCH: 28.4 pg (ref 26.0–34.0)
MCHC: 31.8 g/dL (ref 30.0–36.0)
MCV: 89 fL (ref 78.0–100.0)
Platelets: 422 10*3/uL — ABNORMAL HIGH (ref 150–400)
RBC: 3.28 MIL/uL — ABNORMAL LOW (ref 3.87–5.11)
RDW: 16.7 % — ABNORMAL HIGH (ref 11.5–15.5)
WBC: 8.9 10*3/uL (ref 4.0–10.5)

## 2016-02-14 LAB — CULTURE, BLOOD (ROUTINE X 2)
CULTURE: NO GROWTH
Culture: NO GROWTH

## 2016-02-14 LAB — GLUCOSE, CAPILLARY: Glucose-Capillary: 113 mg/dL — ABNORMAL HIGH (ref 65–99)

## 2016-02-14 LAB — HEPARIN LEVEL (UNFRACTIONATED): Heparin Unfractionated: 0.46 IU/mL (ref 0.30–0.70)

## 2016-02-14 MED ORDER — SODIUM CHLORIDE 0.9% FLUSH
10.0000 mL | Freq: Two times a day (BID) | INTRAVENOUS | Status: DC
  Administered 2016-02-14 – 2016-02-16 (×3): 10 mL

## 2016-02-14 MED ORDER — POTASSIUM CHLORIDE CRYS ER 20 MEQ PO TBCR
40.0000 meq | EXTENDED_RELEASE_TABLET | Freq: Once | ORAL | Status: AC
  Administered 2016-02-14: 40 meq via ORAL
  Filled 2016-02-14: qty 2

## 2016-02-14 MED ORDER — SODIUM CHLORIDE 0.9% FLUSH: 10.0000 mL | INTRAVENOUS | Status: DC | PRN

## 2016-02-14 MED ORDER — ADULT MULTIVITAMIN W/MINERALS CH
1.0000 | ORAL_TABLET | Freq: Every day | ORAL | Status: DC
  Administered 2016-02-15 – 2016-02-16 (×2): 1 via ORAL
  Filled 2016-02-14 (×2): qty 1

## 2016-02-14 MED ORDER — TRAMADOL HCL 50 MG PO TABS
50.0000 mg | ORAL_TABLET | Freq: Four times a day (QID) | ORAL | Status: DC | PRN
Start: 2016-02-14 — End: 2016-02-16
  Administered 2016-02-14 – 2016-02-16 (×4): 50 mg via ORAL
  Filled 2016-02-14 (×4): qty 1

## 2016-02-14 MED ORDER — APIXABAN 5 MG PO TABS
5.0000 mg | ORAL_TABLET | Freq: Two times a day (BID) | ORAL | Status: DC
Start: 2016-02-21 — End: 2016-02-16

## 2016-02-14 MED ORDER — APIXABAN 5 MG PO TABS
10.0000 mg | ORAL_TABLET | Freq: Two times a day (BID) | ORAL | Status: DC
  Administered 2016-02-14 – 2016-02-16 (×5): 10 mg via ORAL
  Filled 2016-02-14 (×6): qty 2

## 2016-02-14 NOTE — Progress Notes (Addendum)
ANTICOAGULATION CONSULT NOTE - Initial Consult  Pharmacy Consult for Apixaban Indication: pulmonary embolus  Allergies  Allergen Reactions  . Codeine Shortness Of Breath and Nausea And Vomiting  . Hydrocodone Shortness Of Breath and Nausea And Vomiting   Patient Measurements: Height: 5\' 8"  (172.7 cm) Weight: 179 lb 0.2 oz (81.2 kg) IBW/kg (Calculated) : 63.9  Vital Signs: Temp: 98.1 F (36.7 C) (03/14 1026) Temp Source: Oral (03/14 1026) BP: 142/79 mmHg (03/14 1026) Pulse Rate: 84 (03/14 1026)  Labs:  Recent Labs  02/12/16 0420 02/13/16 0445 02/13/16 0454 02/14/16 0341  HGB 8.4*  --  9.1* 9.3*  HCT 26.1*  --  28.5* 29.2*  PLT 396  --  436* 422*  HEPARINUNFRC 0.38 0.51  --  0.46  CREATININE 0.77  --  0.76 0.79    Assessment: [age] yoF with PE that has been on heparin since 3/10 that we have been consulted to transition to Apixaban. Of note, pt is on MSSA and Rifampin for complicated systemic MSSA infection.   Goal of Therapy:  Monitor platelets by anticoagulation protocol: Yes   Plan:  1. Will begin Apixaban 10 mg BID for 7 days followed by Apixaban 5 mg BID 2. CBC daily 3. Interaction between Rifampin and Apixaban (and other NOAC's) exist in that it may reduce plasma concentration of NOAC's; must weigh risk vs benefit  4. Following along with you daily    Pollyann SamplesAndy Daphanie Oquendo, PharmD, BCPS 02/14/2016, 10:44 AM Pager: [phone]

## 2016-02-14 NOTE — Progress Notes (Signed)
  Echocardiogram 2D Echocardiogram has been performed.  Leta JunglingCooper, Gurtej Noyola M 02/14/2016, 8:48 AM

## 2016-02-14 NOTE — Care Management Important Message (Signed)
Important Message  Patient Details  Name: Cassandra KeensBetty R Gabrielle MRN: [id number] Date of Birth: 03/01/1933   Medicare Important Message Given:  Yes    Tierra Divelbiss P Eliza Green 02/14/2016, 12:32 PM

## 2016-02-14 NOTE — Care Management Note (Signed)
Case Management Note  Patient Details  Name: Cassandra [name] MRN: [id number] Date of Birth: 04/03/1933  Subjective/Objective:                    Action/Plan: Patient is from [facility] and the plan is to return when medically stable. CM continuing to follow for d/c needs.   Expected Discharge Date:                  Expected Discharge Plan:  Skilled Nursing Facility  In-House Referral:  Clinical Social Work  Discharge planning Services  CM Consult  Post Acute Care Choice:    Choice offered to:     DME Arranged:    DME Agency:     HH Arranged:    HH Agency:     Status of Service:  In process, will continue to follow  Medicare Important Message Given:  Yes Date Medicare IM Given:    Medicare IM give by:    Date Additional Medicare IM Given:    Additional Medicare Important Message give by:     If discussed at Long Length of Stay Meetings, dates discussed:    Additional Comments:  Kermit BaloKelli F Maxximus Gotay, RN 02/14/2016, 3:11 PM

## 2016-02-14 NOTE — Progress Notes (Signed)
Patient ID: Cassandra [name], female   DOB: 02/10/1933, 82 y.o.   MRN: [id number] Patient ID: Cassandra KeensBetty R [name], female   DOB: 02/27/1933, 82 y.o.   MRN: [id number]         [facility]    Date of Admission:  02/09/2016    Total days of antibiotics 44        Day 37 nafcillin since first negative blood culture        Day 3 rifampin  Principal Problem:   Acute pulmonary embolism (HCC) Active Problems:   Hypertension   Hyperlipidemia   PFO (patent foramen ovale)   Cerebral embolism with cerebral infarction   Bacterial endocarditis   Chronic osteomyelitis of left foot (HCC)   S/P amputation of foot (HCC)   GERD (gastroesophageal reflux disease)   PE (pulmonary embolism)   Discitis of lumbar region   . sodium chloride   Intravenous Once  . apixaban  10 mg Oral BID  . [START ON 02/21/2016] apixaban  5 mg Oral BID  . aspirin  81 mg Oral Daily  . atenolol  100 mg Oral Daily  . brimonidine  1 drop Both Eyes TID  . brinzolamide  1 drop Both Eyes TID  . ciprofloxacin-dexamethasone  4 drop Right Ear BID  . famotidine  40 mg Oral Daily  . feeding supplement (ENSURE ENLIVE)  237 mL Oral BID BM  . fluticasone  2 spray Each Nare Daily  . furosemide  40 mg Intravenous Daily  . isosorbide mononitrate  30 mg Oral Daily  . latanoprost  1 drop Both Eyes QHS  . losartan  100 mg Oral Daily  . multivitamin with minerals  1 tablet Oral Daily  . nafcillin IVPB 2 gram/100 mL D5W (Pyxis)  2 g Intravenous Q4H  . nystatin  1 Bottle Topical BID  . pantoprazole  40 mg Oral BID  . saccharomyces boulardii  250 mg Oral BID  . sertraline  100 mg Oral Daily  . simvastatin  10 mg Oral QHS  . sodium chloride flush  10-40 mL Intracatheter Q12H  . sodium chloride flush  3 mL Intravenous Q12H    SUBJECTIVE: She states that her back pain is under better control. She states she has no appetite at all. Last night she had some upset stomach and she has had some intermittent nausea over  the past 24 hours. She had one loose stool this morning.  Review of Systems: Review of Systems  Constitutional: Positive for malaise/fatigue. Negative for fever, chills, weight loss and diaphoresis.  HENT: Negative for sore throat.   Respiratory: Negative for cough, sputum production and shortness of breath.   Cardiovascular: Negative for chest pain.  Gastrointestinal: Positive for nausea. Negative for vomiting and diarrhea.  Genitourinary:       She's had a Foley catheter in since she was hospitalized in January. She has failed 3 separate voiding trials.  Musculoskeletal: Positive for back pain. Negative for myalgias and joint pain.  Skin: Negative for rash.  Neurological: Negative for focal weakness and headaches.    Past Medical History  Diagnosis Date  . Hypertension   . Arthritis   . Depression   . Hyperlipidemia   . PFO (patent foramen ovale)     small  . H/O echocardiogram 2008    prolapse of post. mitral leaflet, tr MR, tr TR, EF >55%, mild concentric LVH  . H/O cardiovascular stress test 10/13/2007    low risk scan, no changes  from previous test  . Heart palpitations     controlled on BB  . Hiatal hernia   . Cellulitis and abscess of toe of left foot 09/16/2014    Social History  Substance Use Topics  . Smoking status: Never Smoker   . Smokeless tobacco: Never Used  . Alcohol Use: No    Family History  Problem Relation Age of Onset  . Heart disease Mother   . Cancer Father   . Kidney disease Brother   . Hypertension Daughter   . Hyperlipidemia Son    Allergies  Allergen Reactions  . Codeine Shortness Of Breath and Nausea And Vomiting  . Hydrocodone Shortness Of Breath and Nausea And Vomiting    OBJECTIVE: Filed Vitals:   02/14/16 0117 02/14/16 0556 02/14/16 1026 02/14/16 1434  BP: 146/73 154/60 142/79 145/72  Pulse: 83 85 84 81  Temp: 98.8 F (37.1 C) 98 F (36.7 C) 98.1 F (36.7 C) 98.9 F (37.2 C)  TempSrc: Oral Oral Oral Oral  Resp: Height:      Weight:      SpO2: 91% 93% 95% 95%   Body mass index is 27.23 kg/(m^2).  Physical Exam  Constitutional: She is oriented to person, place, and time.  She looks better today. She is sitting up in a chair. Her granddaughter has helped her 6 her hair and put on some lipstick.  Eyes: Conjunctivae are normal.  Cardiovascular: Normal rate and regular rhythm.   No murmur heard. Pulmonary/Chest: Breath sounds normal.  Abdominal: Soft. There is no tenderness.  Neurological: She is alert and oriented to person, place, and time.  Skin: No rash noted.  New right arm PICC.  Psychiatric: Mood and affect normal.    Lab Results Lab Results  Component Value Date   WBC 8.9 02/14/2016   HGB 9.3* 02/14/2016   HCT 29.2* 02/14/2016   MCV 89.0 02/14/2016   PLT 422* 02/14/2016    Lab Results  Component Value Date   CREATININE 0.79 02/14/2016   BUN 7 02/14/2016   NA 136 02/14/2016   K 3.3* 02/14/2016   CL 96* 02/14/2016   CO2 28 02/14/2016    Lab Results  Component Value Date   ALT 9* 02/14/2016   AST 18 02/14/2016   ALKPHOS 81 02/14/2016   BILITOT 2.1* 02/14/2016     Microbiology: Recent Results (from the past 240 hour(s))  Culture, blood (Routine X 2) w Reflex to ID Panel     Status: None   Collection Time: 02/09/16  7:37 PM  Result Value Ref Range Status   Specimen Description BLOOD LEFT ANTECUBITAL  Final   Special Requests IN PEDIATRIC BOTTLE 1CC  Final   Culture NO GROWTH 5 DAYS  Final   Report Status 02/14/2016 FINAL  Final  Culture, blood (Routine X 2) w Reflex to ID Panel     Status: None   Collection Time: 02/09/16  7:47 PM  Result Value Ref Range Status   Specimen Description BLOOD RIGHT HAND  Final   Special Requests IN PEDIATRIC BOTTLE 1CC  Final   Culture NO GROWTH 5 DAYS  Final   Report Status 02/14/2016 FINAL  Final  Urine culture     Status: None   Collection Time: 02/10/16  1:40 AM  Result Value Ref Range Status   Specimen Description  URINE, CATHETERIZED  Final   Special Requests Normal  Final   Culture >=100,000 COLONIES/mL ESCHERICHIA COLI  Final  Report Status 02/12/2016 FINAL  Final   Organism ID, Bacteria ESCHERICHIA COLI  Final      Susceptibility   Escherichia coli - MIC*    AMPICILLIN >=32 RESISTANT Resistant     CEFAZOLIN >=64 RESISTANT Resistant     CEFTRIAXONE <=1 SENSITIVE Sensitive     CIPROFLOXACIN >=4 RESISTANT Resistant     GENTAMICIN <=1 SENSITIVE Sensitive     IMIPENEM <=0.25 SENSITIVE Sensitive     NITROFURANTOIN <=16 SENSITIVE Sensitive     TRIMETH/SULFA 160 RESISTANT Resistant     AMPICILLIN/SULBACTAM >=32 RESISTANT Resistant     PIP/TAZO >=128 RESISTANT Resistant     * >=100,000 COLONIES/mL ESCHERICHIA COLI  MRSA PCR Screening     Status: None   Collection Time: 02/10/16  5:10 AM  Result Value Ref Range Status   MRSA by PCR NEGATIVE NEGATIVE Final    Comment:        The GeneXpert MRSA Assay (FDA approved for NASAL specimens only), is one component of a comprehensive MRSA colonization surveillance program. It is not intended to diagnose MRSA infection nor to guide or monitor treatment for MRSA infections.   Anaerobic culture     Status: None (Preliminary result)   Collection Time: 02/13/16  1:10 PM  Result Value Ref Range Status   Specimen Description NEEDLE ASPIRATE  Final   Special Requests L1 L2 DISC SPACE  Final   Gram Stain   Final    RARE WBC PRESENT, PREDOMINANTLY PMN NO SQUAMOUS EPITHELIAL CELLS SEEN NO ORGANISMS SEEN Performed at [facility]    Culture PENDING  Incomplete   Report Status PENDING  Incomplete  Culture, routine-abscess     Status: None (Preliminary result)   Collection Time: 02/13/16  1:10 PM  Result Value Ref Range Status   Specimen Description NEEDLE ASPIRATE  Final   Special Requests L1 L2 DISC SPACE  Final   Gram Stain   Final    RARE WBC PRESENT, PREDOMINANTLY PMN NO SQUAMOUS EPITHELIAL CELLS SEEN NO ORGANISMS SEEN Performed at  [facility]    Culture PENDING  Incomplete   Report Status PENDING  Incomplete     ASSESSMENT: The Gram stain of her lumbar aspirate did not reveal any organisms and cultures are negative so far. I suspect that rifampin is causing some of her stomach upset, anorexia and nausea. Rifampin also has an adverse drug interaction with apixaban so I will stop it now.  PLAN: 1. Continue nafcillin  2. Discontinue rifampin 3. Await results of lumbar aspirate cultures  Cliffton Asters, MD [facility] [phone] pager   [phone] cell 02/14/2016, 5:40 PM

## 2016-02-14 NOTE — Progress Notes (Signed)
Initial Nutrition Assessment  DOCUMENTATION CODES:   Not applicable  INTERVENTION:   -Ensure Enlive po BID, each supplement provides 350 kcal and 20 grams of protein -MVI daily  NUTRITION DIAGNOSIS:   Increased nutrient needs related to wound healing as evidenced by estimated needs.  GOAL:   Patient will meet greater than or equal to 90% of their needs  MONITOR:   PO intake, Supplement acceptance, Labs, Weight trends, Skin, I & O's  REASON FOR ASSESSMENT:   Malnutrition Screening Tool    ASSESSMENT:   Cassandra [name] is a 82 y.o. female with PMH of endocarditis on nafcillin, septic emboli of brain, chronic left food all steel myelitis (S/p of transmetatarsal amputation of left foot), splenic infarct, hypertension, hyperlipidemia, GERD, depression, PFO, who presents with left flank pain and shortness breath.  Pt admitted with acute PE.   Per chart review, MRI of lumbar spine revealed discitis osteomyelitis from L1 down the sacrum. Underwent disc aspiration with IR on 02/13/16; awaiting cultures.   Hx obtained from pt, who was sitting in recliner at time of visit. She reports her appetite was poor for 4-5 weeks PTA, however, has improved since admission. Noted meal completion 45-100%. Pt consumed about 75% of her lunch tray.   Pt reports UBW is around 170-175#, which is consistent with weight hx.   Reviewed CWOCN note from 02/13/16; pt with full thickness post op wound of lt foot. Noted [facility]   Discussed importance of good meal intake to promote healing. Pt reports she was consuming Ensure supplements PTA and would like to receive them during hospitalization.   Nutrition-Focused physical exam completed. Findings are mild fat depletion, mild muscle depletion, and no edema. Susepct depletion is related to advanced age and decline in mobility.   CSW following. Plan to return back to [facility] once  medically stable.   Labs reviewed: K: 3.3.   Diet Order:  Diet Heart Room service appropriate?: Yes; Fluid consistency:: Thin  Skin:  Wound (see comment) (Full thickness post-op incision to left foot stump)  Last BM:  02/13/16  Height:   Ht Readings from Last 1 Encounters:  02/10/16 5\' 8"  (1.727 m)    Weight:   Wt Readings from Last 1 Encounters:  02/10/16 179 lb 0.2 oz (81.2 kg)    Ideal Body Weight:  63.6 kg  BMI:  Body mass index is 27.23 kg/(m^2).  Estimated Nutritional Needs:   Kcal:  1700-1900  Protein:  90-100 grams  Fluid:  1.7-1.9 L  EDUCATION NEEDS:   Education needs addressed  Shera Laubach A. Mayford KnifeWilliams, RD, LDN, CDE Pager: [phone] After hours Pager: [phone]

## 2016-02-14 NOTE — Progress Notes (Addendum)
PROGRESS NOTE  Cassandra [name] ZOX:[id number] DOB: 04-Jan-1933 DOA: 02/09/2016 PCP: Colette Ribas, MD  Brief Summary:  Hx of endocarditis on nafcillin, septic emboli of brain/spleen, chronic left food osteomyelitis (S/p of transmetatarsal amputation of left foot), htn, hld, depression, PFO, who was sent from urology's office to [place] [facility] due to incidental findings of PE on ct ab/pel, she did c/o left flank pain and shortness breath. Found to have PE/LLE DVT, admitted to stepdown , started on IV heparin. patient With indwelling foley for bilateral hydronephrosis,  New findings of l1-l2 discitis/paraspinal abscess this admission, s/p flouroscope guided aspiraration. Neurosurgery/ID consulted,  transitioned from heparin drip to eliquis, awaiting for paraspinal fluids culture to determine abx regimen. Repeat echocardiogram pending.   HPI/Recap of past 24 hours:  Sitting up in chair, NAD, c/o running nose, report right ear pain improved with ear drops, reported taking too many pills , want to simplify meds, on room air, denies sob, very pleasant Indwelling foley with clear urine. No family in room  Assessment/Plan: Principal Problem:   Acute pulmonary embolism (HCC) Active Problems:   Hypertension   Hyperlipidemia   PFO (patent foramen ovale)   Cerebral embolism with cerebral infarction   Bacterial endocarditis   Chronic osteomyelitis of left foot (HCC)   S/P amputation of foot (HCC)   GERD (gastroesophageal reflux disease)   PE (pulmonary embolism)   Discitis of lumbar region  Acute PE and left lower extremity  DVT:  tachycardia has resolved, now hemodynamically stable, repeat echo done on 3/14, result pending. Received iv heparin drip from admission to 3/14 , transitioned to elliquis on 3/14.  Bilateral Pleural effusion: right > Left, from PE? Also has low albumin, anasarca, Does has grade i diastolic dysfunction on echo, mild pitting edema on right leg, more edema on left leg  (+DVT left leg), on room air,  Lasix  after prbc x1 unit of 3/11. Start lasix  iv qd. Leg edema better, diminished breath sounds on right lower lobe, echo pending  H/o Urinary retention, CT scan from 01/10/2016 showing bilateral hydronephrosis without evidence of distal obstruction. -A Foley catheter was placed from last hospitalization, due to failure of voiding trial, she is discharged to [facility] with foley, foley was last changed on 2/21, patient reported she was evaluated by urology Dr Patsi Sears this week prior to being admitted to the hospital, she is advised to continue foley.  I Talked Dr urology Dr Isabel Caprice on 3/13, he recommended to keep the foley in and follow up with Dr Ollen Gross in a month from 2/21 ( last time has foley changed), he will arrange follow up.  ua from 3/10 on presentation during this hospitalization + bacteriuria , + yeast, culture + ecoli, no fever, no leukocytosis, started rocephin per ID recommendation on 3/12, rocephin discontinued per ID recommendation on 3/13.[name]    Discitis/ l1-l2/right paraspinal abscess  Reported on outpatient CT ab/pel, I have discussed with radiology Dr Molli Posey on 3/12  Who recommended MRI lumber spine w/wo contrast,  Neurosurgery and  Infectious disease consulted on 3/12.  S/P L1-L2 Fluoro guided disc aspiration by IR On 3/13, now on nafcillin/rifampin, waiting spinal fluids culture to finalized abx treatment plan.  Endocarditis: Evidenced by blood cultures from 12/31/2015 growing MSSA. She is has been on Nafcillin 2 g IV every 4 hours per Dr. Erich Montane recommendation, supposed to be treated until 02/20/16. Source of bacteremia felt to be left foot osteomyelitis. Pt is s/p of transmetatarsal amputation of left foot osteo.Repeat  blood cultures from 01/06/2016 and from 3/9 showing no growth to date. Per last discharge summary, CVTS will reassess after antibiotics therapy completed to decide at that time needs for surgery/valvular repair.  -continue  IV nafcillin, now started on rifampin from 3/12 due to new findings of discitis/paraspinal abscess -appreciate infectious disease input  Chronic left foot wound/osteomyelitis . MRI of foot from 01/03/2016 reported by radiology to show possible underlying osteomyelitis, cannot rule out underlying abscess. She underwent transmetatarsal amputation of left foot for chronic osteomyelitis.  -continue IV nafcillin, now on rifampin from 3/12 -wound care input appreciated, Continue present plan of care with dry gauze and ace wrap. Ortho boot re-applied over the site. Pt to follow-up with ortho  Dr Victorino Dike after discharge.   Splenic infarct. Patient complaining of abdominal pain on 01/09/2016, further worked up with CT scan of abdomen and pelvis with IV contrast, study revealed splenic infarct involving 30%, likely secondary to septic emboli.  -Continue IV antibiotics as above   Normocytic Anemia: likely anemia of chronic disease: hgb has been fluctuating ranging from 7.6 to 10 since 01/2016, she received two prbc during last hospitalization.  stool negative for occult blood, urine no gross hematuria, no obvious bleeding, bp stable, patient just had ct ab on 3/9, no retroperitoneal hematoma, anemia work up showed inappropriate low reticulocytes , otherwise unremarkable. Transfuse 1prbc on 3/11. Appropriate increase of hgb post transfusion.  Hypokalemia: replace k  HTN; -Atenolol, losartan, imdur, lasix  (new from this admission, does may need to be adjusted at discharge)  HLD: Last LDL was 71 on 01/06/16 -Continue home medications: Zocor  Depression and anxiety: Stable, no suicidal or homicidal ideations. -Continue home medications  Elevated t bili: unclear significance, will get fractionated bili.  Has been on schedulded ultram tid, change to prn.   Right sided ear pain, tenderness to palpation to right mastoid region (posterior to right ear), CT temparoal no acute findings, I did bedside otoscope  on 3/13 , exam unremarkable, reported ear drops helped.  DVT ppx: on IV heparin from admission to 3/14, changed to apixaban from 3/14   Code Status: full  Family Communication: patient , called patient's granddaughter Nehemiah Settle who is very involved with patient's care.  Disposition Plan:  return to snf once spinal abscess culture resulted and abx treatment plan finalized.   Consultants:  pccm phone conversation by admitting MD DR Clyde Lundborg  Infectious disease  Neurosurgery  IR  Urology over the phone on 3/13  Procedures: S/P L1-L2 Fluoro guided disc aspiration on 3/13  Antibiotics:  Nafacillin, continued , duration to be determined   Rocephin from 3/12 to 3/13  rifampin  From 3/12   Objective: BP 142/79 mmHg  Pulse 84  Temp(Src) 98.1 F (36.7 C) (Oral)  Resp 18  Ht 5\' 8"  (1.727 m)  Wt 81.2 kg (179 lb 0.2 oz)  BMI 27.23 kg/m2  SpO2 95%  Intake/Output Summary (Last 24 hours) at 02/14/16 1042 Last data filed at 02/14/16 0855  Gross per 24 hour  Intake 641.83 ml  Output    825 ml  Net -183.17 ml   Filed Weights   02/10/16 0522  Weight: 81.2 kg (179 lb 0.2 oz)    Exam:   General:  NAD  Cardiovascular: RRR  Respiratory: diminished at basis, right side decreased,  no rales, no rhonchi, no wheezing  Abdomen: Soft/ND/NT, positive BS, indwelling foley   Musculoskeletal: left foot post op changes /dressing intact, bilateral lower extremity edema, left >right, improving,  Neuro: aaox3, subtle right hand weakness which is improving per patient  Data Reviewed: Basic Metabolic Panel:  Recent Labs Lab 02/10/16 0344 02/10/16 1549 02/11/16 0355 02/12/16 0420 02/13/16 0454 02/14/16 0341  NA 136 136 137 136 136 136  K 3.5 3.1* 3.1* 3.3* 3.3* 3.3*  CL 102 101 103 100* 98* 96*  CO2 21* 24 26 24 28 28   GLUCOSE 133* 104* 101* 119* 95 107*  BUN <5* <5* <5* <5* <5* 7  CREATININE 0.65 0.73 0.71 0.77 0.76 0.79  CALCIUM 8.2* 8.4* 8.1* 8.0* 8.2* 8.6*  MG 1.8   --  1.7 2.1  --   --    Liver Function Tests:  Recent Labs Lab 02/10/16 1549 02/11/16 0355 02/12/16 0420 02/13/16 0454 02/14/16 0341  AST 13* 12* 12* 14* 18  ALT 7* 9* 8* 8* 9*  ALKPHOS 72 67 67 74 81  BILITOT 1.5* 1.4* 1.7* 1.9* 2.1*  PROT 5.7* 5.4* 5.4* 6.0* 6.0*  ALBUMIN 1.4* 1.2* 1.3* 1.4* 1.5*   No results for input(s): LIPASE, AMYLASE in the last 168 hours. No results for input(s): AMMONIA in the last 168 hours. CBC:  Recent Labs Lab 02/08/16 1720 02/09/16 1947  02/11/16 0355 02/11/16 1413 02/12/16 0420 02/13/16 0454 02/14/16 0341  WBC 12.8* 11.6*  < > 9.7 10.8* 8.4 9.1 8.9  NEUTROABS 8.7* 7.4  --   --   --   --   --   --   HGB 8.0* 8.3*  < > 7.4* 7.6* 8.4* 9.1* 9.3*  HCT 24.9* 25.6*  < > 23.2* 23.8* 26.1* 28.5* 29.2*  MCV 88.9 88.0  < > 88.2 88.5 87.6 88.2 89.0  PLT 461* 447*  < > 426* 463* 396 436* 422*  < > = values in this interval not displayed. Cardiac Enzymes:   No results for input(s): CKTOTAL, CKMB, CKMBINDEX, TROPONINI in the last 168 hours. BNP (last 3 results)  Recent Labs  01/01/16 1142  BNP 432.0*    ProBNP (last 3 results) No results for input(s): PROBNP in the last 8760 hours.  CBG:  Recent Labs Lab 02/10/16 0722 02/11/16 0731 02/12/16 0743 02/13/16 0756 02/14/16 0602  GLUCAP 109* 99 81 73 113*    Recent Results (from the past 240 hour(s))  Culture, blood (Routine X 2) w Reflex to ID Panel     Status: None (Preliminary result)   Collection Time: 02/09/16  7:37 PM  Result Value Ref Range Status   Specimen Description BLOOD LEFT ANTECUBITAL  Final   Special Requests IN PEDIATRIC BOTTLE 1CC  Final   Culture NO GROWTH 4 DAYS  Final   Report Status PENDING  Incomplete  Culture, blood (Routine X 2) w Reflex to ID Panel     Status: None (Preliminary result)   Collection Time: 02/09/16  7:47 PM  Result Value Ref Range Status   Specimen Description BLOOD RIGHT HAND  Final   Special Requests IN PEDIATRIC BOTTLE 1CC  Final    Culture NO GROWTH 4 DAYS  Final   Report Status PENDING  Incomplete  Urine culture     Status: None   Collection Time: 02/10/16  1:40 AM  Result Value Ref Range Status   Specimen Description URINE, CATHETERIZED  Final   Special Requests Normal  Final   Culture >=100,000 COLONIES/mL ESCHERICHIA COLI  Final   Report Status 02/12/2016 FINAL  Final   Organism ID, Bacteria ESCHERICHIA COLI  Final      Susceptibility   Escherichia coli -  MIC*    AMPICILLIN >=32 RESISTANT Resistant     CEFAZOLIN >=64 RESISTANT Resistant     CEFTRIAXONE <=1 SENSITIVE Sensitive     CIPROFLOXACIN >=4 RESISTANT Resistant     GENTAMICIN <=1 SENSITIVE Sensitive     IMIPENEM <=0.25 SENSITIVE Sensitive     NITROFURANTOIN <=16 SENSITIVE Sensitive     TRIMETH/SULFA 160 RESISTANT Resistant     AMPICILLIN/SULBACTAM >=32 RESISTANT Resistant     PIP/TAZO >=128 RESISTANT Resistant     * >=100,000 COLONIES/mL ESCHERICHIA COLI  MRSA PCR Screening     Status: None   Collection Time: 02/10/16  5:10 AM  Result Value Ref Range Status   MRSA by PCR NEGATIVE NEGATIVE Final    Comment:        The GeneXpert MRSA Assay (FDA approved for NASAL specimens only), is one component of a comprehensive MRSA colonization surveillance program. It is not intended to diagnose MRSA infection nor to guide or monitor treatment for MRSA infections.   Anaerobic culture     Status: None (Preliminary result)   Collection Time: 02/13/16  1:10 PM  Result Value Ref Range Status   Specimen Description NEEDLE ASPIRATE  Final   Special Requests L1 L2 DISC SPACE  Final   Gram Stain   Final    RARE WBC PRESENT, PREDOMINANTLY PMN NO SQUAMOUS EPITHELIAL CELLS SEEN NO ORGANISMS SEEN [facility]formed at [facility]    Culture PENDING  Incomplete   Report Status PENDING  Incomplete  Culture, routine-abscess     Status: None (Preliminary result)   Collection Time: 02/13/16  1:10 PM  Result Value Ref Range Status   Specimen Description NEEDLE  ASPIRATE  Final   Special Requests L1 L2 DISC SPACE  Final   Gram Stain   Final    RARE WBC PRESENT, PREDOMINANTLY PMN NO SQUAMOUS EPITHELIAL CELLS SEEN NO ORGANISMS SEEN Performed at [facility]    Culture PENDING  Incomplete   Report Status PENDING  Incomplete     Studies: Ct Temporal Bones W/o Cm  02/13/2016  CLINICAL DATA:  Right mastoid pain, several days duration. EXAM: CT TEMPORAL BONES WITHOUT CONTRAST TECHNIQUE: Axial and coronal plane CT imaging of the petrous temporal bones was performed with thin-collimation image reconstruction. No intravenous contrast was administered. Multiplanar CT image reconstructions were also generated. COMPARISON:  CT 01/06/2016.  MRI 01/04/2016.  MRI 03/13/2012. FINDINGS: There are a few opacified mastoid air cells at the tip and laterally, similar in appearance to not only the MRI of 1 month ago but also the MRI of 2013. The majority of the mastoid air cells are clear. There is no evidence of bone destruction or adjacent soft tissue reaction. No fluid in the middle ear. Ossicles are normal. Inner ear structures are normal. On the left, mastoid air cells are entirely clear. No abnormality in the middle ear. Inner ear structures appear normal as well. Temporomandibular joints appear unremarkable. IMPRESSION: No abnormality seen that would be expected to be painful. There are a few mastoid air cells at the tip and laterally that are chronically opacified, as far back as 2013. No evidence of bone erosion or adjacent soft tissue inflammatory change. Electronically Signed   By: Paulina FusiMark  Shogry M.D.   On: 02/13/2016 15:38    Scheduled Meds: . sodium chloride   Intravenous Once  . apixaban  10 mg Oral BID  . [START ON 02/21/2016] apixaban  5 mg Oral BID  . aspirin  81 mg Oral Daily  .  atenolol  100 mg Oral Daily  . brimonidine  1 drop Both Eyes TID  . brinzolamide  1 drop Both Eyes TID  . ciprofloxacin-dexamethasone  4 drop Right Ear BID  . famotidine   40 mg Oral Daily  . feeding supplement (ENSURE ENLIVE)  237 mL Oral BID BM  . fluticasone  2 spray Each Nare Daily  . furosemide  40 mg Intravenous Daily  . isosorbide mononitrate  30 mg Oral Daily  . latanoprost  1 drop Both Eyes QHS  . losartan  100 mg Oral Daily  . nafcillin IVPB 2 gram/100 mL D5W (Pyxis)  2 g Intravenous Q4H  . nystatin  1 Bottle Topical BID  . pantoprazole  40 mg Oral BID  . potassium chloride  40 mEq Oral Once  . rifampin  300 mg Oral Q12H  . saccharomyces boulardii  250 mg Oral BID  . sertraline  100 mg Oral Daily  . simvastatin  10 mg Oral QHS  . sodium chloride flush  3 mL Intravenous Q12H    Continuous Infusions:     Time spent:  Savanna Dooley MD, PhD  [facility] Pager [phone]. If 7PM-7AM, please contact night-coverage at [email or web address], password [facility] 02/14/2016, 10:42 AM  LOS: 4 days

## 2016-02-15 DIAGNOSIS — B9561 Methicillin susceptible Staphylococcus aureus infection as the cause of diseases classified elsewhere: Secondary | ICD-10-CM

## 2016-02-15 DIAGNOSIS — Z960 Presence of urogenital implants: Secondary | ICD-10-CM

## 2016-02-15 DIAGNOSIS — I2699 Other pulmonary embolism without acute cor pulmonale: Principal | ICD-10-CM

## 2016-02-15 DIAGNOSIS — Z95828 Presence of other vascular implants and grafts: Secondary | ICD-10-CM

## 2016-02-15 DIAGNOSIS — G061 Intraspinal abscess and granuloma: Secondary | ICD-10-CM

## 2016-02-15 LAB — CBC
HEMATOCRIT: 28.1 % — AB (ref 36.0–46.0)
HEMOGLOBIN: 9.1 g/dL — AB (ref 12.0–15.0)
MCH: 28.6 pg (ref 26.0–34.0)
MCHC: 32.4 g/dL (ref 30.0–36.0)
MCV: 88.4 fL (ref 78.0–100.0)
Platelets: 397 10*3/uL (ref 150–400)
RBC: 3.18 MIL/uL — ABNORMAL LOW (ref 3.87–5.11)
RDW: 16.6 % — ABNORMAL HIGH (ref 11.5–15.5)
WBC: 7.5 10*3/uL (ref 4.0–10.5)

## 2016-02-15 LAB — COMPREHENSIVE METABOLIC PANEL
ALK PHOS: 78 U/L (ref 38–126)
ALT: 11 U/L — ABNORMAL LOW (ref 14–54)
ANION GAP: 10 (ref 5–15)
AST: 20 U/L (ref 15–41)
Albumin: 1.4 g/dL — ABNORMAL LOW (ref 3.5–5.0)
BILIRUBIN TOTAL: 1.8 mg/dL — AB (ref 0.3–1.2)
BUN: 6 mg/dL (ref 6–20)
CALCIUM: 8.6 mg/dL — AB (ref 8.9–10.3)
CHLORIDE: 97 mmol/L — AB (ref 101–111)
CO2: 28 mmol/L (ref 22–32)
Creatinine, Ser: 0.81 mg/dL (ref 0.44–1.00)
GFR calc non Af Amer: >60 mL/min (ref >60)
GLUCOSE: 95 mg/dL (ref 65–99)
POTASSIUM: 3.4 mmol/L — AB (ref 3.5–5.1)
SODIUM: 135 mmol/L (ref 135–145)
Total Protein: 6.1 g/dL — ABNORMAL LOW (ref 6.5–8.1)

## 2016-02-15 LAB — TYPE AND SCREEN
ABO/RH(D): O POS
Antibody Screen: POSITIVE
DAT, IGG: POSITIVE
UNIT DIVISION: 0
UNIT DIVISION: 0

## 2016-02-15 LAB — GLUCOSE, CAPILLARY: Glucose-Capillary: 95 mg/dL (ref 65–99)

## 2016-02-15 NOTE — Clinical Social Work Note (Addendum)
[facility], CSW spoke to [facility] to update that per md patient not medically ready for discharge today.  Unit CSW to continue to follow patient's progress.  Ervin KnackEric R. Duel Conrad, MSW, LCSWA [phone] 02/15/2016 6:01 PM

## 2016-02-15 NOTE — Progress Notes (Signed)
PROGRESS NOTE  Cassandra [name] ZOX:[id number] DOB: 08-22-1933 DOA: 02/09/2016 PCP: Cassandra Ribas, MD  Brief Summary:  Hx of endocarditis on nafcillin, septic emboli of brain/spleen, chronic left food osteomyelitis (S/p of transmetatarsal amputation of left foot), htn, hld, depression, PFO, who was sent from urology's office to [facility] due to incidental findings of PE on ct ab/pel, she did c/o left flank pain and shortness breath. Found to have PE/LLE DVT, admitted to stepdown , started on IV heparin. patient With indwelling foley for bilateral hydronephrosis,  New findings of l1-l2 discitis/paraspinal abscess this admission, s/p flouroscope guided aspiraration. Neurosurgery/ID consulted,  transitioned from heparin drip to eliquis, awaiting for paraspinal fluids culture to determine abx regimen. Repeat echocardiogram pending.   HPI/Recap of past 24 hours: Pt reports no new complaints. Looking for to transitioning out of hospital soon.  Assessment/Plan: Principal Problem:   Acute pulmonary embolism (HCC) Active Problems:   Hypertension   Hyperlipidemia   PFO (patent foramen ovale)   Cerebral embolism with cerebral infarction   Bacterial endocarditis   Chronic osteomyelitis of left foot (HCC)   S/P amputation of foot (HCC)   GERD (gastroesophageal reflux disease)   PE (pulmonary embolism)   Discitis of lumbar region  Acute PE and left lower extremity  DVT:  tachycardia has resolved, now hemodynamically stable, repeat echo done on 3/14, result pending. Received iv heparin drip from admission to 3/14 , transitioned to elliquis on 3/14.  Bilateral Pleural effusion: right > Left, from PE? Also has low albumin, anasarca, Does has grade i diastolic dysfunction on echo, mild pitting edema on right leg, more edema on left leg (+DVT left leg), on room air,  Lasix  after prbc x1 unit of 3/11. Start lasix  iv qd. Leg edema better, diminished breath sounds on right lower lobe,  echo reporting 60-65% EF with normal wall motion  H/o Urinary retention, CT scan from 01/10/2016 showing bilateral hydronephrosis without evidence of distal obstruction. -A Foley catheter was placed from last hospitalization, due to failure of voiding trial, she is discharged to [facility] with foley, foley was last changed on 2/21, patient reported she was evaluated by urology Dr Cassandra [name] this week prior to being admitted to the hospital, she is advised to continue foley. Case was discussed with urology  Dr Cassandra [name] on 3/13, he recommended to keep the foley in and follow up with Dr Cassandra [name] in a month from 2/21 ( last time has foley changed), he will arrange follow up.   Discitis/ l1-l2/right paraspinal abscess  Reported on outpatient CT ab/pel, I have discussed with radiology Dr Cassandra [name] on 3/12  Who recommended MRI lumber spine w/wo contrast,  Neurosurgery and  Infectious disease consulted on 3/12.  S/P L1-L2 Fluoro guided disc aspiration by IR On 3/13, now on nafcillin/rifampin, waiting spinal fluids culture to finalized abx treatment plan.  Endocarditis: Evidenced by blood cultures from 12/31/2015 growing MSSA. She is has been on Nafcillin 2 g IV every 4 hours per Dr. Erich [name] recommendation, supposed to be treated until 02/20/16. Source of bacteremia felt to be left foot osteomyelitis. Pt is s/p of transmetatarsal amputation of left foot osteo.Repeat blood cultures from 01/06/2016 and from 3/9 showing no growth to date. Per last discharge summary, CVTS will reassess after antibiotics therapy completed to decide at that time needs for surgery/valvular repair.  -continue IV nafcillin due to new findings of discitis/paraspinal abscess -appreciate infectious disease input  Chronic left foot wound/osteomyelitis . MRI of foot from 01/03/2016 reported  by radiology to show possible underlying osteomyelitis, cannot rule out underlying abscess. She underwent transmetatarsal amputation of left foot for chronic  osteomyelitis.  -continue IV nafcillin, now on rifampin from 3/12 -wound care input appreciated, Continue present plan of care with dry gauze and ace wrap. Ortho boot re-applied over the site. Pt to follow-up with ortho  Dr Cassandra [name] after discharge.   Splenic infarct. Patient complaining of abdominal pain on 01/09/2016, further worked up with CT scan of abdomen and pelvis with IV contrast, study revealed splenic infarct involving 30%, likely secondary to septic emboli.  -Continue IV antibiotics as recommended by ID  Normocytic Anemia: likely anemia of chronic disease: hgb has been fluctuating ranging from 7.6 to 10 since 01/2016, she received two prbc during last hospitalization.  stool negative for occult blood, urine no [name] hematuria, no obvious bleeding, bp stable, patient just had ct ab on 3/9, no retroperitoneal hematoma, anemia work up showed inappropriate low reticulocytes , otherwise unremarkable. Transfuse 1prbc on 3/11. Appropriate increase of hgb post transfusion.  Hypokalemia: Mild should normalize with continued improvement in oral intake  HTN; -Atenolol, losartan, imdur, lasix   HLD: Last LDL was 71 on 01/06/16 -Continue home medications: Zocor  Depression and anxiety: Stable, no suicidal or homicidal ideations. -Continue home medications  Elevated t bili: unclear significance, will get fractionated bili.  Has been on schedulded ultram tid, change to prn.   Right sided ear pain, tenderness to palpation to right mastoid region (posterior to right ear), CT temparoal no acute findings, I did bedside otoscope on 3/13 , exam unremarkable, reported ear drops helped.  DVT ppx: on IV heparin from admission to 3/14, changed to apixaban from 3/14   Code Status: full  Family Communication: patient , called patient's granddaughter Cassandra [name] who is very involved with patient's care.  Disposition Plan:  return to snf next a.m.   Consultants:  pccm phone conversation by admitting  MD DR Cassandra [name]  Infectious disease  Neurosurgery  IR  Urology over the phone on 3/13  Procedures: S/P L1-L2 Fluoro guided disc aspiration on 3/13  Antibiotics:  Nafacillin, continued , duration to be determined   Rocephin from 3/12 to 3/13  rifampin  From 3/12   Objective: BP 138/71 mmHg  Pulse 83  Temp(Src) 97.5 F (36.4 C) (Oral)  Resp 20  Ht 5\' 8"  (1.727 m)  Wt 81.2 kg (179 lb 0.2 oz)  BMI 27.23 kg/m2  SpO2 96%  Intake/Output Summary (Last 24 hours) at 02/15/16 1828 Last data filed at 02/15/16 1630  [name] per 24 hour  Intake    240 ml  Output   1875 ml  Net  -1635 ml   Filed Weights   02/10/16 0522  Weight: 81.2 kg (179 lb 0.2 oz)    Exam:   General:  NAD, alert and awake  Cardiovascular: RRR  Respiratory: diminished at basis, right side decreased,  no rales, no rhonchi, no wheezing  Abdomen: Soft/ND/NT, positive BS, indwelling foley   Musculoskeletal: left foot post op changes /dressing intact, bilateral lower extremity edema, left >right, improving,   Neuro: aaox3, subtle right hand weakness which is improving per patient  Data Reviewed: Basic Metabolic Panel:  Recent Labs Lab 02/10/16 0344  02/11/16 0355 02/12/16 0420 02/13/16 0454 02/14/16 0341 02/15/16 0325  NA 136  < > 137 136 136 136 135  K 3.5  < > 3.1* 3.3* 3.3* 3.3* 3.4*  CL 102  < > 103 100* 98* 96* 97*  CO2 21*  < >  26 24 28 28 28   GLUCOSE 133*  < > 101* 119* 95 107* 95  BUN <5*  < > <5* <5* <5* 7 6  CREATININE 0.65  < > 0.71 0.77 0.76 0.79 0.81  CALCIUM 8.2*  < > 8.1* 8.0* 8.2* 8.6* 8.6*  MG 1.8  --  1.7 2.1  --   --   --   < > = values in this interval not displayed. Liver Function Tests:  Recent Labs Lab 02/11/16 0355 02/12/16 0420 02/13/16 0454 02/14/16 0341 02/15/16 0325  AST 12* 12* 14* 18 20  ALT 9* 8* 8* 9* 11*  ALKPHOS 67 67 74 81 78  BILITOT 1.4* 1.7* 1.9* 2.1* 1.8*  PROT 5.4* 5.4* 6.0* 6.0* 6.1*  ALBUMIN 1.2* 1.3* 1.4* 1.5* 1.4*   No results for  input(s): LIPASE, AMYLASE in the last 168 hours. No results for input(s): AMMONIA in the last 168 hours. CBC:  Recent Labs Lab 02/09/16 1947  02/11/16 1413 02/12/16 0420 02/13/16 0454 02/14/16 0341 02/15/16 0325  WBC 11.6*  < > 10.8* 8.4 9.1 8.9 7.5  NEUTROABS 7.4  --   --   --   --   --   --   HGB 8.3*  < > 7.6* 8.4* 9.1* 9.3* 9.1*  HCT 25.6*  < > 23.8* 26.1* 28.5* 29.2* 28.1*  MCV 88.0  < > 88.5 87.6 88.2 89.0 88.4  PLT 447*  < > 463* 396 436* 422* 397  < > = values in this interval not displayed. Cardiac Enzymes:   No results for input(s): CKTOTAL, CKMB, CKMBINDEX, TROPONINI in the last 168 hours. BNP (last 3 results)  Recent Labs  01/01/16 1142  BNP 432.0*    ProBNP (last 3 results) No results for input(s): PROBNP in the last 8760 hours.  CBG:  Recent Labs Lab 02/11/16 0731 02/12/16 0743 02/13/16 0756 02/14/16 0602 02/15/16 0612  GLUCAP 99 81 73 113* 95    Recent Results (from the past 240 hour(s))  Culture, blood (Routine X 2) w Reflex to ID Panel     Status: None   Collection Time: 02/09/16  7:37 PM  Result Value Ref Range Status   Specimen Description BLOOD LEFT ANTECUBITAL  Final   Special Requests IN PEDIATRIC BOTTLE 1CC  Final   Culture NO GROWTH 5 DAYS  Final   Report Status 02/14/2016 FINAL  Final  Culture, blood (Routine X 2) w Reflex to ID Panel     Status: None   Collection Time: 02/09/16  7:47 PM  Result Value Ref Range Status   Specimen Description BLOOD RIGHT HAND  Final   Special Requests IN PEDIATRIC BOTTLE 1CC  Final   Culture NO GROWTH 5 DAYS  Final   Report Status 02/14/2016 FINAL  Final  Urine culture     Status: None   Collection Time: 02/10/16  1:40 AM  Result Value Ref Range Status   Specimen Description URINE, CATHETERIZED  Final   Special Requests Normal  Final   Culture >=100,000 COLONIES/mL ESCHERICHIA COLI  Final   Report Status 02/12/2016 FINAL  Final   Organism ID, Bacteria ESCHERICHIA COLI  Final      Susceptibility    Escherichia coli - MIC*    AMPICILLIN >=32 RESISTANT Resistant     CEFAZOLIN >=64 RESISTANT Resistant     CEFTRIAXONE <=1 SENSITIVE Sensitive     CIPROFLOXACIN >=4 RESISTANT Resistant     GENTAMICIN <=1 SENSITIVE Sensitive     IMIPENEM <=0.25 SENSITIVE  Sensitive     NITROFURANTOIN <=16 SENSITIVE Sensitive     TRIMETH/SULFA 160 RESISTANT Resistant     AMPICILLIN/SULBACTAM >=32 RESISTANT Resistant     PIP/TAZO >=128 RESISTANT Resistant     * >=100,000 COLONIES/mL ESCHERICHIA COLI  MRSA PCR Screening     Status: None   Collection Time: 02/10/16  5:10 AM  Result Value Ref Range Status   MRSA by PCR NEGATIVE NEGATIVE Final    Comment:        The GeneXpert MRSA Assay (FDA approved for NASAL specimens only), is one component of a comprehensive MRSA colonization surveillance program. It is not intended to diagnose MRSA infection nor to guide or monitor treatment for MRSA infections.   Anaerobic culture     Status: None (Preliminary result)   Collection Time: 02/13/16  1:10 PM  Result Value Ref Range Status   Specimen Description NEEDLE ASPIRATE  Final   Special Requests L1 L2 DISC SPACE  Final   Gram Stain   Final    RARE WBC PRESENT, PREDOMINANTLY PMN NO SQUAMOUS EPITHELIAL CELLS SEEN NO ORGANISMS SEEN Performed at [facility]    Culture   Final    NO ANAEROBES ISOLATED; CULTURE IN PROGRESS FOR 5 DAYS Performed at [facility]    Report Status PENDING  Incomplete  Culture, routine-abscess     Status: None (Preliminary result)   Collection Time: 02/13/16  1:10 PM  Result Value Ref Range Status   Specimen Description NEEDLE ASPIRATE  Final   Special Requests L1 L2 DISC SPACE  Final   Gram Stain   Final    RARE WBC PRESENT, PREDOMINANTLY PMN NO SQUAMOUS EPITHELIAL CELLS SEEN NO ORGANISMS SEEN Performed at [facility]    Culture   Final    NO GROWTH 1 DAY Performed at [facility]    Report Status PENDING  Incomplete  Acid Fast  Smear (AFB)     Status: None   Collection Time: 02/13/16  1:10 PM  Result Value Ref Range Status   AFB Specimen Processing Concentration  Final   Acid Fast Smear Negative  Final    Comment: (NOTE) Performed At: [facility] [address] [place], [place] [id number] Mila Homer MD QI:[id number]    Source (AFB) NEEDLE ASPIRATE  Final    Comment: L1L2 DISC SPACE     Studies: No results found.  Scheduled Meds: . sodium chloride   Intravenous Once  . apixaban  10 mg Oral BID  . [START ON 02/21/2016] apixaban  5 mg Oral BID  . aspirin  81 mg Oral Daily  . atenolol  100 mg Oral Daily  . brimonidine  1 drop Both Eyes TID  . brinzolamide  1 drop Both Eyes TID  . ciprofloxacin-dexamethasone  4 drop Right Ear BID  . famotidine  40 mg Oral Daily  . feeding supplement (ENSURE ENLIVE)  237 mL Oral BID BM  . fluticasone  2 spray Each Nare Daily  . furosemide  40 mg Intravenous Daily  . isosorbide mononitrate  30 mg Oral Daily  . latanoprost  1 drop Both Eyes QHS  . losartan  100 mg Oral Daily  . multivitamin with minerals  1 tablet Oral Daily  . nafcillin IVPB 2 gram/100 mL D5W (Pyxis)  2 g Intravenous Q4H  . nystatin  1 Bottle Topical BID  . pantoprazole  40 mg Oral BID  . saccharomyces boulardii  250 mg Oral BID  . sertraline  100  mg Oral Daily  . simvastatin  10 mg Oral QHS  . sodium chloride flush  10-40 mL Intracatheter Q12H  . sodium chloride flush  3 mL Intravenous Q12H    Continuous Infusions:     Time spent:  Penny Pia MD, PhD  [facility] Pager [phone] If 7PM-7AM, please contact night-coverage at [email or web address], password [facility] 02/15/2016, 6:28 PM  LOS: 5 days

## 2016-02-15 NOTE — Progress Notes (Signed)
Patient ID: Cassandra [name], female   DOB: July 29, 1933, 82 y.o.   MRN: [id number]         [facility]    Date of Admission:  02/09/2016    Total days of antibiotics 45        Day 38 nafcillin since first negative blood culture         Principal Problem:   Acute pulmonary embolism (HCC) Active Problems:   Hypertension   Hyperlipidemia   PFO (patent foramen ovale)   Cerebral embolism with cerebral infarction   Bacterial endocarditis   Chronic osteomyelitis of left foot (HCC)   S/P amputation of foot (HCC)   GERD (gastroesophageal reflux disease)   PE (pulmonary embolism)   Discitis of lumbar region   . sodium chloride   Intravenous Once  . apixaban  10 mg Oral BID  . [START ON 02/21/2016] apixaban  5 mg Oral BID  . aspirin  81 mg Oral Daily  . atenolol  100 mg Oral Daily  . brimonidine  1 drop Both Eyes TID  . brinzolamide  1 drop Both Eyes TID  . ciprofloxacin-dexamethasone  4 drop Right Ear BID  . famotidine  40 mg Oral Daily  . feeding supplement (ENSURE ENLIVE)  237 mL Oral BID BM  . fluticasone  2 spray Each Nare Daily  . furosemide  40 mg Intravenous Daily  . isosorbide mononitrate  30 mg Oral Daily  . latanoprost  1 drop Both Eyes QHS  . losartan  100 mg Oral Daily  . multivitamin with minerals  1 tablet Oral Daily  . nafcillin IVPB 2 gram/100 mL D5W (Pyxis)  2 g Intravenous Q4H  . nystatin  1 Bottle Topical BID  . pantoprazole  40 mg Oral BID  . saccharomyces boulardii  250 mg Oral BID  . sertraline  100 mg Oral Daily  . simvastatin  10 mg Oral QHS  . sodium chloride flush  10-40 mL Intracatheter Q12H  . sodium chloride flush  3 mL Intravenous Q12H    SUBJECTIVE: She states that her back pain is under better control. She states she has no appetite at all. Last night she had some upset stomach and she has had some intermittent nausea over the past 24 hours. She had one loose stool this morning.  Review of Systems: Review of Systems    Constitutional: Positive for malaise/fatigue. Negative for fever, chills, weight loss and diaphoresis.  HENT: Negative for sore throat.   Respiratory: Negative for cough, sputum production and shortness of breath.   Cardiovascular: Negative for chest pain.  Gastrointestinal: Positive for nausea. Negative for vomiting and diarrhea.  Genitourinary:       She's had a Foley catheter in since she was hospitalized in January. She has failed 3 separate voiding trials.  Musculoskeletal: Positive for back pain. Negative for myalgias and joint pain.  Skin: Negative for rash.  Neurological: Negative for focal weakness and headaches.    Past Medical History  Diagnosis Date  . Hypertension   . Arthritis   . Depression   . Hyperlipidemia   . PFO (patent foramen ovale)     small  . H/O echocardiogram 2008    prolapse of post. mitral leaflet, tr MR, tr TR, EF >55%, mild concentric LVH  . H/O cardiovascular stress test 10/13/2007    low risk scan, no changes from previous test  . Heart palpitations     controlled on BB  . Hiatal hernia   .  Cellulitis and abscess of toe of left foot 09/16/2014    Social History  Substance Use Topics  . Smoking status: Never Smoker   . Smokeless tobacco: Never Used  . Alcohol Use: No    Family History  Problem Relation Age of Onset  . Heart disease Mother   . Cancer Father   . Kidney disease Brother   . Hypertension Daughter   . Hyperlipidemia Son    Allergies  Allergen Reactions  . Codeine Shortness Of Breath and Nausea And Vomiting  . Hydrocodone Shortness Of Breath and Nausea And Vomiting    OBJECTIVE: Filed Vitals:   02/14/16 2129 02/15/16 0118 02/15/16 0520 02/15/16 1045  BP: 148/70 152/69 149/80 115/68  Pulse: 78 69 72 76  Temp: 98.7 F (37.1 C) 98.5 F (36.9 C) 97.9 F (36.6 C) 97.5 F (36.4 C)  TempSrc: Oral Oral Oral Oral  Resp: Height:      Weight:      SpO2: 95% 98% 97% 98%   Body mass index is 27.23  kg/(m^2).  Physical Exam  Constitutional: She is oriented to person, place, and time.  She is sitting up in a chair. She appears much more comfortable.  Eyes: Conjunctivae are normal.  Cardiovascular: Normal rate and regular rhythm.   No murmur heard. Pulmonary/Chest: Breath sounds normal.  Abdominal: Soft. There is no tenderness.  Neurological: She is alert and oriented to person, place, and time.  Skin: No rash noted.  New right arm PICC.  Psychiatric: Mood and affect normal.    Lab Results Lab Results  Component Value Date   WBC 7.5 02/15/2016   HGB 9.1* 02/15/2016   HCT 28.1* 02/15/2016   MCV 88.4 02/15/2016   PLT 397 02/15/2016    Lab Results  Component Value Date   CREATININE 0.81 02/15/2016   BUN 6 02/15/2016   NA 135 02/15/2016   K 3.4* 02/15/2016   CL 97* 02/15/2016   CO2 28 02/15/2016    Lab Results  Component Value Date   ALT 11* 02/15/2016   AST 20 02/15/2016   ALKPHOS 78 02/15/2016   BILITOT 1.8* 02/15/2016     Microbiology: Recent Results (from the past 240 hour(s))  Culture, blood (Routine X 2) w Reflex to ID Panel     Status: None   Collection Time: 02/09/16  7:37 PM  Result Value Ref Range Status   Specimen Description BLOOD LEFT ANTECUBITAL  Final   Special Requests IN PEDIATRIC BOTTLE 1CC  Final   Culture NO GROWTH 5 DAYS  Final   Report Status 02/14/2016 FINAL  Final  Culture, blood (Routine X 2) w Reflex to ID Panel     Status: None   Collection Time: 02/09/16  7:47 PM  Result Value Ref Range Status   Specimen Description BLOOD RIGHT HAND  Final   Special Requests IN PEDIATRIC BOTTLE 1CC  Final   Culture NO GROWTH 5 DAYS  Final   Report Status 02/14/2016 FINAL  Final  Urine culture     Status: None   Collection Time: 02/10/16  1:40 AM  Result Value Ref Range Status   Specimen Description URINE, CATHETERIZED  Final   Special Requests Normal  Final   Culture >=100,000 COLONIES/mL ESCHERICHIA COLI  Final   Report Status 02/12/2016  FINAL  Final   Organism ID, Bacteria ESCHERICHIA COLI  Final      Susceptibility   Escherichia coli - MIC*    AMPICILLIN >=32 RESISTANT  Resistant     CEFAZOLIN >=64 RESISTANT Resistant     CEFTRIAXONE <=1 SENSITIVE Sensitive     CIPROFLOXACIN >=4 RESISTANT Resistant     GENTAMICIN <=1 SENSITIVE Sensitive     IMIPENEM <=0.25 SENSITIVE Sensitive     NITROFURANTOIN <=16 SENSITIVE Sensitive     TRIMETH/SULFA 160 RESISTANT Resistant     AMPICILLIN/SULBACTAM >=32 RESISTANT Resistant     PIP/TAZO >=128 RESISTANT Resistant     * >=100,000 COLONIES/mL ESCHERICHIA COLI  MRSA PCR Screening     Status: None   Collection Time: 02/10/16  5:10 AM  Result Value Ref Range Status   MRSA by PCR NEGATIVE NEGATIVE Final    Comment:        The GeneXpert MRSA Assay (FDA approved for NASAL specimens only), is one component of a comprehensive MRSA colonization surveillance program. It is not intended to diagnose MRSA infection nor to guide or monitor treatment for MRSA infections.   Anaerobic culture     Status: None (Preliminary result)   Collection Time: 02/13/16  1:10 PM  Result Value Ref Range Status   Specimen Description NEEDLE ASPIRATE  Final   Special Requests L1 L2 DISC SPACE  Final   Gram Stain   Final    RARE WBC PRESENT, PREDOMINANTLY PMN NO SQUAMOUS EPITHELIAL CELLS SEEN NO ORGANISMS SEEN [facility]    Culture   Final    NO ANAEROBES ISOLATED; CULTURE IN PROGRESS FOR 5 DAYS [facility] at [facility]    Report Status PENDING  Incomplete  Culture, routine-abscess     Status: None (Preliminary result)   Collection Time: 02/13/16  1:10 PM  Result Value Ref Range Status   Specimen Description NEEDLE ASPIRATE  Final   Special Requests L1 L2 DISC SPACE  Final   Gram Stain   Final    RARE WBC PRESENT, PREDOMINANTLY PMN NO SQUAMOUS EPITHELIAL CELLS SEEN NO ORGANISMS SEEN [facility]formed at [facility]    Culture   Final    NO GROWTH 1  DAY Performed at [facility]    Report Status PENDING  Incomplete  Acid Fast Smear (AFB)     Status: None   Collection Time: 02/13/16  1:10 PM  Result Value Ref Range Status   AFB Specimen Processing Concentration  Final   Acid Fast Smear Negative  Final    Comment: (NOTE) Performed At: [facility] [address] [place], [place] [id number] Mila HomerHancock William F MD BM:[id number]:[phone]    Source (AFB) NEEDLE ASPIRATE  Final    Comment: L1L2 DISC SPACE     ASSESSMENT: Her lumbar aspirate cultures remain negative at 48 hours and I expect them to stay that way given the extensive antibiotic therapy she has had. Although her lumbar infection was just recognized I'm quite certain that it is due to her systemic MSSA infection. Her severe low back pain began in December well before she was hospitalized with bacteremia and endocarditis. She is more comfortable now with adjustment of her pain medication. I plan on continuing her nafcillin. As far as I'm concerned she can be discharged at any time. I will monitor final cultures and make any adjustments as needed. I will sign off now.  PLAN: 1. Continue nafcillin at least until follow-up in our clinic within 3 weeks (I will arrange)  Cliffton AstersJohn Kavon Valenza, MD [facility] [phone] pager   [phone] cell 02/15/2016, 1:12 PM

## 2016-02-15 NOTE — Discharge Instructions (Signed)
Information on my medicine - ELIQUIS (apixaban)  This medication education was reviewed with me or my healthcare representative as part of my discharge preparation.  The pharmacist that spoke with me during my hospital stay was:  Sheron NightingaleJames A Myangel Summons, [facility]  Why was Eliquis prescribed for you? Eliquis was prescribed to treat blood clots that may have been found in the veins of your legs (deep vein thrombosis) or in your lungs (pulmonary embolism) and to reduce the risk of them occurring again.  What do You need to know about Eliquis ? The starting dose is 10 mg (two 5 mg tablets) taken TWICE daily for the FIRST SEVEN (7) DAYS, then on 02/21/16  the dose is reduced to ONE 5 mg tablet taken TWICE daily.  Eliquis may be taken with or without food.   Try to take the dose about the same time in the morning and in the evening. If you have difficulty swallowing the tablet whole please discuss with your pharmacist how to take the medication safely.  Take Eliquis exactly as prescribed and DO NOT stop taking Eliquis without talking to the doctor who prescribed the medication.  Stopping may increase your risk of developing a new blood clot.  Refill your prescription before you run out.  After discharge, you should have regular check-up appointments with your healthcare provider that is prescribing your Eliquis.    What do you do if you miss a dose? If a dose of ELIQUIS is not taken at the scheduled time, take it as soon as possible on the same day and twice-daily administration should be resumed. The dose should not be doubled to make up for a missed dose.  Important Safety Information A possible side effect of Eliquis is bleeding. You should call your healthcare provider right away if you experience any of the following: ? Bleeding from an injury or your nose that does not stop. ? Unusual colored urine (red or dark brown) or unusual colored stools (red or black). ? Unusual bruising for unknown  reasons. ? A serious fall or if you hit your head (even if there is no bleeding).  Some medicines may interact with Eliquis and might increase your risk of bleeding or clotting while on Eliquis. To help avoid this, consult your healthcare provider or pharmacist prior to using any new prescription or non-prescription medications, including herbals, vitamins, non-steroidal anti-inflammatory drugs (NSAIDs) and supplements.  This website has more information on Eliquis (apixaban): http://www.eliquis.com/eliquis/home

## 2016-02-15 NOTE — Progress Notes (Signed)
Physical Therapy Treatment Patient Details Name: Cassandra [name] MRN: [id number] DOB: 18-Sep-1933 Today's Date: 02/15/2016    History of Present Illness pt presents with R PE, R Pleural Effusion, Endocarditis, and Splenic Infarct.  Pt with recent hx of L Transmetarsal Amputation and PMHx of HTN, PFO, Anxiety, and Depression.      PT Comments    Modest progress. Required heavy mod assist for sit<>stand transfers and pivots to different surfaces. Heavy posterior lean which improved with practice throughout therapy session however still requires assist to maintain PWB on LLE. Tolerated exercises well and appreciative of therapy's assistance. Hygiene performed after episode of bowel incontinence while working today. Patient will continue to benefit from skilled physical therapy services to further improve independence with functional mobility.   Follow Up Recommendations  SNF     Equipment Recommendations  None recommended by PT    Recommendations for Other Services       Precautions / Restrictions Precautions Precautions: Fall Required Braces or Orthoses: Other Brace/Splint Other Brace/Splint: Post-op boot for L foot Restrictions Weight Bearing Restrictions: Yes LLE Weight Bearing: Partial weight bearing LLE Partial Weight Bearing Percentage or Pounds: From OT session: per grand-daughter pt was made [facility] on February 06, 2016. She reports the instructions said PWB but did not give a %    Mobility  Bed Mobility               General bed mobility comments: pt sitting in recliner.  Transfers Overall transfer level: Needs assistance Equipment used: Rolling walker (2 wheeled) Transfers: Sit to/from Stand Sit to Stand: Mod assist Stand pivot transfers: Mod assist       General transfer comment: Mod assist for boost from recliner x2. Demonstrates heavy posterior lean. Education for anterior weight shift and hand placement. When practiced from [facility], demonstrated better upright  stance, practiced this several times on BSC. Mod assist for pivot to and from BSC/recliner. Required assist for balance, walker control, and to maintain PWB on LLE.    Ambulation/Gait                 Stairs            Wheelchair Mobility    Modified Rankin (Stroke Patients Only)       Balance                                    Cognition Arousal/Alertness: Awake/alert Behavior During Therapy: WFL for tasks assessed/performed Overall Cognitive Status: Within Functional Limits for tasks assessed                      Exercises General Exercises - Lower Extremity Ankle Circles/Pumps: AROM;Both;10 reps;Seated Quad Sets: Strengthening;Both;10 reps;Seated Gluteal Sets: Strengthening;Both;10 reps;Seated Long Arc Quad: Strengthening;Both;10 reps;Seated Hip Flexion/Marching: Strengthening;Both;10 reps;Seated    General Comments General comments (skin integrity, edema, etc.): Bowel incontinence in recliner. Pt transferred to [facility] but no  more stool. Cleaned and linens changed.      Pertinent Vitals/Pain Pain Assessment: Faces Faces Pain Scale: Hurts little more Pain Location: back Pain Descriptors / Indicators: Aching Pain Intervention(s): Monitored during session;Repositioned    Home Living                      Prior Function            PT Goals (current goals can now be found in the care  plan section) Acute Rehab PT Goals Patient Stated Goal: Walk again. PT Goal Formulation: With patient/family Time For Goal Achievement: 02/24/16 Potential to Achieve Goals: Good Progress towards PT goals: Progressing toward goals    Frequency  Min 3X/week    PT Plan Current plan remains appropriate    Co-evaluation             End of Session Equipment Utilized During Treatment: Gait belt Activity Tolerance: Patient tolerated treatment well Patient left: in chair;with call bell/phone within reach;with family/visitor present;with  chair alarm set     Time: [id number] PT Time Calculation (min) (ACUTE ONLY): 43 min  Charges:  $Therapeutic Exercise: 8-22 mins $Therapeutic Activity: 23-37 mins                    G Codes:      [place], Nema Oatley S 02/15/2016, 4:28 PM Sunday [place], [place] [phone]

## 2016-02-16 DIAGNOSIS — R2681 Unsteadiness on feet: Secondary | ICD-10-CM | POA: Diagnosis not present

## 2016-02-16 DIAGNOSIS — Z418 Encounter for other procedures for purposes other than remedying health state: Secondary | ICD-10-CM | POA: Diagnosis not present

## 2016-02-16 DIAGNOSIS — I6340 Cerebral infarction due to embolism of unspecified cerebral artery: Secondary | ICD-10-CM | POA: Diagnosis not present

## 2016-02-16 DIAGNOSIS — N319 Neuromuscular dysfunction of bladder, unspecified: Secondary | ICD-10-CM | POA: Diagnosis not present

## 2016-02-16 DIAGNOSIS — R040 Epistaxis: Secondary | ICD-10-CM | POA: Diagnosis not present

## 2016-02-16 DIAGNOSIS — I739 Peripheral vascular disease, unspecified: Secondary | ICD-10-CM | POA: Diagnosis not present

## 2016-02-16 DIAGNOSIS — D735 Infarction of spleen: Secondary | ICD-10-CM | POA: Diagnosis not present

## 2016-02-16 DIAGNOSIS — J811 Chronic pulmonary edema: Secondary | ICD-10-CM | POA: Diagnosis not present

## 2016-02-16 DIAGNOSIS — J90 Pleural effusion, not elsewhere classified: Secondary | ICD-10-CM | POA: Diagnosis not present

## 2016-02-16 DIAGNOSIS — R339 Retention of urine, unspecified: Secondary | ICD-10-CM | POA: Diagnosis not present

## 2016-02-16 DIAGNOSIS — I82402 Acute embolism and thrombosis of unspecified deep veins of left lower extremity: Secondary | ICD-10-CM | POA: Diagnosis not present

## 2016-02-16 DIAGNOSIS — R7881 Bacteremia: Secondary | ICD-10-CM | POA: Diagnosis not present

## 2016-02-16 DIAGNOSIS — N1330 Unspecified hydronephrosis: Secondary | ICD-10-CM | POA: Diagnosis not present

## 2016-02-16 DIAGNOSIS — I509 Heart failure, unspecified: Secondary | ICD-10-CM | POA: Diagnosis not present

## 2016-02-16 DIAGNOSIS — M4640 Discitis, unspecified, site unspecified: Secondary | ICD-10-CM | POA: Diagnosis not present

## 2016-02-16 DIAGNOSIS — M86072 Acute hematogenous osteomyelitis, left ankle and foot: Secondary | ICD-10-CM | POA: Diagnosis not present

## 2016-02-16 DIAGNOSIS — R338 Other retention of urine: Secondary | ICD-10-CM | POA: Diagnosis not present

## 2016-02-16 DIAGNOSIS — I10 Essential (primary) hypertension: Secondary | ICD-10-CM | POA: Diagnosis not present

## 2016-02-16 DIAGNOSIS — I2690 Septic pulmonary embolism without acute cor pulmonale: Secondary | ICD-10-CM | POA: Diagnosis not present

## 2016-02-16 DIAGNOSIS — M4646 Discitis, unspecified, lumbar region: Secondary | ICD-10-CM | POA: Diagnosis not present

## 2016-02-16 DIAGNOSIS — Z89422 Acquired absence of other left toe(s): Secondary | ICD-10-CM | POA: Diagnosis not present

## 2016-02-16 DIAGNOSIS — R278 Other lack of coordination: Secondary | ICD-10-CM | POA: Diagnosis not present

## 2016-02-16 DIAGNOSIS — A419 Sepsis, unspecified organism: Secondary | ICD-10-CM | POA: Diagnosis not present

## 2016-02-16 DIAGNOSIS — D649 Anemia, unspecified: Secondary | ICD-10-CM | POA: Diagnosis not present

## 2016-02-16 DIAGNOSIS — M6281 Muscle weakness (generalized): Secondary | ICD-10-CM | POA: Diagnosis not present

## 2016-02-16 DIAGNOSIS — Z89439 Acquired absence of unspecified foot: Secondary | ICD-10-CM | POA: Diagnosis not present

## 2016-02-16 DIAGNOSIS — I2699 Other pulmonary embolism without acute cor pulmonale: Secondary | ICD-10-CM | POA: Diagnosis not present

## 2016-02-16 DIAGNOSIS — I82409 Acute embolism and thrombosis of unspecified deep veins of unspecified lower extremity: Secondary | ICD-10-CM | POA: Diagnosis not present

## 2016-02-16 DIAGNOSIS — R531 Weakness: Secondary | ICD-10-CM | POA: Diagnosis not present

## 2016-02-16 DIAGNOSIS — R0602 Shortness of breath: Secondary | ICD-10-CM | POA: Diagnosis not present

## 2016-02-16 DIAGNOSIS — I639 Cerebral infarction, unspecified: Secondary | ICD-10-CM | POA: Diagnosis not present

## 2016-02-16 LAB — CBC
HEMATOCRIT: 27.7 % — AB (ref 36.0–46.0)
Hemoglobin: 8.9 g/dL — ABNORMAL LOW (ref 12.0–15.0)
MCH: 28.7 pg (ref 26.0–34.0)
MCHC: 32.1 g/dL (ref 30.0–36.0)
MCV: 89.4 fL (ref 78.0–100.0)
PLATELETS: 387 10*3/uL (ref 150–400)
RBC: 3.1 MIL/uL — ABNORMAL LOW (ref 3.87–5.11)
RDW: 16.9 % — AB (ref 11.5–15.5)
WBC: 6.2 10*3/uL (ref 4.0–10.5)

## 2016-02-16 LAB — GLUCOSE, CAPILLARY: Glucose-Capillary: 121 mg/dL — ABNORMAL HIGH (ref 65–99)

## 2016-02-16 LAB — CULTURE, ROUTINE-ABSCESS: Culture: NO GROWTH

## 2016-02-16 MED ORDER — FUROSEMIDE 40 MG PO TABS: 40.0000 mg | ORAL_TABLET | Freq: Every day | ORAL | Status: DC

## 2016-02-16 MED ORDER — HYDROMORPHONE HCL 2 MG PO TABS: 1.0000 mg | ORAL_TABLET | ORAL | Status: DC | PRN

## 2016-02-16 MED ORDER — APIXABAN 5 MG PO TABS: 5.0000 mg | ORAL_TABLET | Freq: Two times a day (BID) | ORAL | Status: DC

## 2016-02-16 MED ORDER — APIXABAN 5 MG PO TABS: 10.0000 mg | ORAL_TABLET | Freq: Two times a day (BID) | ORAL | Status: DC

## 2016-02-16 MED ORDER — HEPARIN SOD (PORK) LOCK FLUSH 100 UNIT/ML IV SOLN
250.0000 [IU] | INTRAVENOUS | Status: AC | PRN
  Administered 2016-02-16: 250 [IU]

## 2016-02-16 MED ORDER — NAFCILLIN SODIUM 2 G IJ SOLR: 2.0000 g | INTRAVENOUS | Status: DC

## 2016-02-16 NOTE — Progress Notes (Signed)
Pt for discharge home today. Discharge orders received. IV and telemetry dcd with dressing clean dry and intact. PICC line capped by IV team. Discharge instructions and prescriptions given with verbalized understanding. PTAR brought patient to lobby via wheelchair at 1615 and  [place]

## 2016-02-16 NOTE — Discharge Summary (Signed)
Physician Discharge Summary  Cassandra [name] ZOX:[id number] DOB: 12-Dec-1932 DOA: 02/09/2016  PCP: Colette Ribas, MD  Admit date: 02/09/2016 Discharge date: 02/16/2016  Time spent: > 35  minutes  Recommendations for Outpatient Follow-up:  1. Please ensure patient follows up with Infection disease Specialist within the next 3 weeks 2. Titrate pain medication as needed for comfort 3. Discontinued aspirin since patient will be on eliquis. 4. Monitor blood pressures 5. Please have pt f/u with Dr. Victorino Dike after discharge (see below) for f/u of left foot wound   Discharge Diagnoses:  Principal Problem:   Acute pulmonary embolism (HCC) Active Problems:   Hypertension   Hyperlipidemia   PFO (patent foramen ovale)   Cerebral embolism with cerebral infarction   Bacterial endocarditis   Chronic osteomyelitis of left foot (HCC)   S/P amputation of foot (HCC)   GERD (gastroesophageal reflux disease)   PE (pulmonary embolism)   Discitis of lumbar region   Discharge Condition: stable  Diet recommendation: Heart healthy  Filed Weights   02/10/16 0522  Weight: 81.2 kg (179 lb 0.2 oz)    History of present illness:  Hx of endocarditis on nafcillin, septic emboli of brain/spleen, chronic left food osteomyelitis (S/p of transmetatarsal amputation of left foot), htn, hld, depression, PFO, who was sent from urology's office to [place] [facility] due to incidental findings of PE on ct ab/pel, she did c/o left flank pain and shortness breath. Found to have PE/LLE DVT, admitted to stepdown , started on IV heparin. patient With indwelling foley for bilateral hydronephrosis, New findings of l1-l2 discitis/paraspinal abscess this admission, s/p flouroscope guided aspiraration. Neurosurgery/ID consulted, transitioned from heparin drip to eliquis, awaiting for paraspinal fluids culture to determine abx regimen.   Hospital Course:  Acute PE and left lower extremity DVT: tachycardia has resolved, now  hemodynamically stable, repeat echo done on 3/14, result pending. Received iv heparin drip from admission to 3/14 , transitioned to elliquis on 3/14.  Bilateral Pleural effusion: right > Left, from PE? Also has low albumin, anasarca, Does has grade i diastolic dysfunction on echo, mild pitting edema on right leg, more edema on left leg (+DVT left leg), on room air, Lasix 40mg  after prbc x1 unit of 3/11. Continue lasix 40mg  po qd. Leg edema better, diminished breath sounds on right lower lobe, echo reporting 60-65% EF with normal wall motion  H/o Urinary retention, CT scan from 01/10/2016 showing bilateral hydronephrosis without evidence of distal obstruction. -A Foley catheter was placed from last hospitalization, due to failure of voiding trial, she is discharged to [facility] with foley, foley was last changed on 2/21, patient reported she was evaluated by urology Dr Patsi Sears this week prior to being admitted to the hospital, she is advised to continue foley. Case was discussed with urology Dr Isabel Caprice on 3/13, he recommended to keep the foley in and follow up with Dr Ollen Gross in a month from 2/21 ( last time has foley changed), he will arrange follow up.   Discitis/ l1-l2/right paraspinal abscess Reported on outpatient CT ab/pel, I have discussed with radiology Dr Molli Posey on 3/12 Who recommended MRI lumber spine w/wo contrast, Neurosurgery and Infectious disease consulted on 3/12. S/P L1-L2 Fluoro guided disc aspiration by IR On 3/13, now on nafcillin as recommended by ID, please refer to their last note on EMR.   Endocarditis: Evidenced by blood cultures from 12/31/2015 growing MSSA. She is has been on Nafcillin 2 g IV every 4 hours per Dr. Erich Montane recommendation, supposed to be  treated until 02/20/16. Source of bacteremia felt to be left foot osteomyelitis. Pt is s/p of transmetatarsal amputation of left foot osteo.Repeat blood cultures from 01/06/2016 and from 3/9 showing no growth to date. Per  last discharge summary, CVTS will reassess after antibiotics therapy completed to decide at that time needs for surgery/valvular repair.  -continue IV nafcillin due to new findings of discitis/paraspinal abscess -continue antibiotics Nafcillin for atleast 3 weeks until post hospital follow up.  Chronic left foot wound/osteomyelitis . MRI of foot from 01/03/2016 reported by radiology to show possible underlying osteomyelitis, cannot rule out underlying abscess. She underwent transmetatarsal amputation of left foot for chronic osteomyelitis.  -continue IV nafcillin, now on rifampin from 3/12 -wound care input appreciated, Continue present plan of care with dry gauze and ace wrap. Ortho boot re-applied over the site. Pt to follow-up with ortho Dr Victorino Dike after discharge.   Splenic infarct. Patient complaining of abdominal pain on 01/09/2016, further worked up with CT scan of abdomen and pelvis with IV contrast, study revealed splenic infarct involving 30%, likely secondary to septic emboli.  -Continue IV antibiotics as recommended by ID  Normocytic Anemia: likely anemia of chronic disease: hgb has been fluctuating ranging from 7.6 to 10 since 01/2016, she received two prbc during last hospitalization. stool negative for occult blood, urine no gross hematuria, no obvious bleeding, bp stable, patient just had ct ab on 3/9, no retroperitoneal hematoma, anemia work up showed inappropriate low reticulocytes , otherwise unremarkable. Transfuse 1prbc on 3/11. Appropriate increase of hgb post transfusion.  Hypokalemia: Mild should normalize with continued improvement in oral intake  HTN; -Atenolol, losartan, imdur, lasix   HLD: Last LDL was 71 on 01/06/16 -Continue home medications: Zocor  Depression and anxiety: Stable, no suicidal or homicidal ideations. -Continue home medications  Elevated t bili: unclear significance, will get fractionated bili. Has been on schedulded ultram tid, change to  prn.   Procedures:  Please see above  Consultations:  IR  ID  Discharge Exam: Filed Vitals:   02/16/16 0900 02/16/16 0936  BP: 150/80 139/76  Pulse: 88 89  Temp: 98.3 F (36.8 C) 98.2 F (36.8 C)  Resp: 18 16    General: Pt in nad, alert and awake Cardiovascular: rrr, no rubs Respiratory: no increased wob, no wheezes  Discharge Instructions   Discharge Instructions    Call MD for:  severe uncontrolled pain    Complete by:  As directed      Call MD for:  temperature >100.4    Complete by:  As directed      Diet - low sodium heart healthy    Complete by:  As directed      Discharge instructions    Complete by:  As directed   Patient to continue 3 weeks of antibiotic therapy until she follows up with infectious disease doctor. Please ensure patient has infectious disease follow-up within the next 3 weeks     Increase activity slowly    Complete by:  As directed           Current Discharge Medication List    START taking these medications   Details  !! apixaban (ELIQUIS) 5 MG TABS tablet Take 2 tablets (10 mg total) by mouth 2 (two) times daily. Qty: 8 tablet, Refills: 0    !! apixaban (ELIQUIS) 5 MG TABS tablet Take 1 tablet (5 mg total) by mouth 2 (two) times daily. Qty: 60 tablet, Refills: 0    furosemide (LASIX) 40 MG tablet  Take 1 tablet (40 mg total) by mouth daily. Qty: 30 tablet, Refills: 0     !! - Potential duplicate medications found. Please discuss with provider.    CONTINUE these medications which have CHANGED   Details  HYDROmorphone (DILAUDID) 2 MG tablet Take 0.5 tablets (1 mg total) by mouth every 4 (four) hours as needed for moderate pain or severe pain. Qty: 30 tablet, Refills: 0    nafcillin 2 g in dextrose 5 % 100 mL Inject 2 g into the vein every 4 (four) hours.      CONTINUE these medications which have NOT CHANGED   Details  atenolol (TENORMIN) 100 MG tablet Take 1 tablet by mouth daily.    Biotin 5000 MCG CAPS Take 10,000  mcg by mouth daily.     Calcium Carbonate-Vitamin D (CALCIUM + D) 600-200 MG-UNIT TABS Take 1 tablet by mouth daily.    carboxymethylcellulose (REFRESH PLUS) 0.5 % SOLN Place 1 drop into both eyes 3 (three) times daily as needed (Dry Eyes).    Cyanocobalamin (VITAMIN B-12 IJ) Inject as directed every 30 (thirty) days.    feeding supplement, ENSURE ENLIVE, (ENSURE ENLIVE) LIQD Take 237 mLs by mouth 2 (two) times daily between meals. Qty: 237 mL, Refills: 12    isosorbide mononitrate (IMDUR) 30 MG 24 hr tablet Take 1 tablet (30 mg total) by mouth daily.    latanoprost (XALATAN) 0.005 % ophthalmic solution Place 1 drop into both eyes at bedtime.    losartan (COZAAR) 100 MG tablet Take 100 mg by mouth daily. Refills: 11    Multiple Vitamins-Minerals (PRESERVISION AREDS PO) Take 1 tablet by mouth 2 (two) times daily.    nystatin (MYCOSTATIN/NYSTOP) 100000 UNIT/GM POWD Apply 1 Bottle topically 2 (two) times daily.    ondansetron (ZOFRAN ODT) 4 MG disintegrating tablet 4mg  ODT q4 hours prn nausea/vomit Qty: 4 tablet, Refills: 0    pantoprazole (PROTONIX) 40 MG tablet Take 1 tablet (40 mg total) by mouth 2 (two) times daily.    potassium chloride (KLOR-CON) 20 MEQ packet Take 20 mEq by mouth daily.    ranitidine (ZANTAC) 300 MG tablet Take 1 tablet (300 mg total) by mouth at bedtime.    saccharomyces boulardii (FLORASTOR) 250 MG capsule Take 250 mg by mouth 2 (two) times daily.    sertraline (ZOLOFT) 100 MG tablet Take 100 mg by mouth daily.     SIMBRINZA 1-0.2 % SUSP Place 1 drop into both eyes 3 (three) times daily.    simvastatin (ZOCOR) 10 MG tablet Take 10 mg by mouth at bedtime.      STOP taking these medications     aspirin 81 MG chewable tablet      traMADol (ULTRAM) 50 MG tablet        Allergies  Allergen Reactions  . Codeine Shortness Of Breath and Nausea And Vomiting  . Hydrocodone Shortness Of Breath and Nausea And Vomiting   Follow-up Information    Follow  up with SIGMUND I Patsi Sears, MD In 1 week.   Specialty:  Urology   Why:  hydronephrosis/indwelling foley   Contact information:   [address] [place] [address] [phone]        The results of significant diagnostics from this hospitalization (including imaging, microbiology, ancillary and laboratory) are listed below for reference.    Significant Diagnostic Studies: Ct Angio Chest Pe W/cm &/or Wo Cm  02/09/2016  CLINICAL DATA:  Pleuritic chest pain. Recent history of bacteremia. Previous CT abdomen and  pelvis demonstrated right lower lobe pulmonary emboli. EXAM: CT ANGIOGRAPHY CHEST WITH CONTRAST TECHNIQUE: Multidetector CT imaging of the chest was performed using the standard protocol during bolus administration of intravenous contrast. Multiplanar CT image reconstructions and MIPs were obtained to evaluate the vascular anatomy. CONTRAST:  100mL OMNIPAQUE IOHEXOL 300 MG/ML  SOLN COMPARISON:  CT abdomen and pelvis 02/09/2016 FINDINGS: Technically adequate study with good opacification of the central and segmental pulmonary arteries. Filling defects are demonstrated in peripheral subsegmental right lower lobe pulmonary arteries with at least 3 branch vessels involved. This corresponds changes seen on the previous abdominal CT scan. There may also be additional embolus to subsegmental right middle lobe branches. No large central pulmonary emboli. No evidence of right heart strain. Normal heart size. Normal caliber thoracic aorta. No aortic dissection. Calcification in the aorta and coronary arteries. Esophagus is decompressed. Mediastinal lymph nodes are not pathologically enlarged and are likely reactive. Small bilateral pleural effusions with basilar atelectasis. Patchy infiltration in the right lung, involving portions of the right upper lung, right middle lung, and right lower lung, may represent pneumonia or pulmonary infarcts. No pneumothorax. Included portions of the upper abdominal  organs demonstrate splenic enlargement with heterogeneous appearance suggesting splenic infarct. No destructive bone lesions. Review of the MIP images confirms the above findings. IMPRESSION: Examination is positive for multiple subsegmental pulmonary emboli. No evidence of right heart strain. Small bilateral pleural effusions, greater on the right, with basilar atelectasis. Patchy airspace disease in the right lung could represent pneumonia and/or infarcts. These results were called by telephone at the time of interpretation on 02/09/2016 at 11:04 pm to Dr. Renne CriglerJOSHUA GEIPLE , who verbally acknowledged these results. Electronically Signed   By: Burman NievesWilliam  Stevens M.D.   On: 02/09/2016 23:07   Mr Lumbar Spine W Wo Contrast  02/12/2016  CLINICAL DATA:  Endocarditis.  Follow up discitis. EXAM: MRI LUMBAR SPINE WITHOUT AND WITH CONTRAST TECHNIQUE: Multiplanar and multiecho pulse sequences of the lumbar spine were obtained without and with intravenous contrast. CONTRAST:  17 ml MultiHance. COMPARISON:  Abdominal pelvic CT 02/09/2016 and 01/09/2016. Lumbar MRI 09/25/2012. FINDINGS: Segmentation: Conventional anatomy assumed, with the last open disc space designated L5-S1. Alignment: Stable mild convex right scoliosis with near anatomic lateral alignment. Bones: The bone marrow is diffusely heterogeneous. Corresponding with the abnormalities demonstrated on recent CT is endplate edema and enhancement at L1-2 suspicious for discitis/osteomyelitis. There is corresponding T2 hyperintensity within the L1-2 disc. Endplate signal changes on the left at L2-3 are associated with some T2 hyperintensity in the L2-3 disc, but are probably degenerative as correlated with older prior studies. Patient also has extensive asymmetric facet arthropathy on the right at L2-3 and L3-4 with subchondral edema and enhancement. These joints may be infected as well. The visualized sacroiliac joints appear unremarkable. Conus medullaris: Extends to the  L1 level and appears normal. No abnormal intradural enhancement is seen. However, there is posterior epidural enhancement surrounding a fluid collection extending inferiorly from L3 into the mid sacrum, surrounding a pre-existing Tarlov cyst. This is best seen on sagittal image number 8 of series 8. This is suspicious for a posterior epidural abscess, supporting the possibility of an infected right-sided facet joint. Paraspinal and other soft tissues: There is diffuse paraspinal edema and enhancement, asymmetric to the right. As seen on recent CT, there are small peripherally enhancing fluid collections within the erector spinae musculature on the right, measuring up to 2 cm in diameter. No focal fluid collection is seen within the psoas  musculature. There is stable dilatation of the right renal pelvis. Disc levels: There are stable degenerative changes in the lower thoracic spine without resulting significant spinal stenosis or nerve root encroachment. L1-2: As above, probable findings of diskitis and osteomyelitis. There is stable annular disc bulging, facet and ligamentous hypertrophy contributing to mild mass effect on the thecal sac. No epidural fluid collection identified at this level. L2-3: Chronic degenerative disc disease with disc bulging, osteophytes and endplate degeneration asymmetric to the left. These findings have progressed from the 2013 CT. Disc space infection at this level is difficult to completely exclude, although not strongly suspected. There is asymmetric facet arthropathy on the right with a potentially infected facet joint. Moderate multifactorial spinal stenosis is present. L3-4: Progressive annular disc bulging, facet and ligamentous hypertrophy, contributing to moderate to severe spinal stenosis. There is asymmetric narrowing of the right lateral recess and right foramen. As above, there is potential infection of the right facet joint with a posterior epidural fluid collection  extending inferiorly into the sacral canal. L4-5: Chronic degenerative disc disease with loss of disc height and endplate osteophytes. Mild facet and ligamentous hypertrophy. Previously demonstrated right-sided disc extrusion has partially involuted. There is stable mild spinal stenosis and mild narrowing of the foramina. L5-S1: Disc height and hydration are maintained. No significant spinal stenosis or nerve root encroachment. IMPRESSION: 1. MR findings are supportive of diskitis and osteomyelitis at L1-2, as seen on recent CT. 2. In addition, there is concern of right-sided facet joint infection at L2-3 and L3-4. There is an associated posterior epidural fluid collection with peripheral enhancement extending from L3 into the mid sacrum, suspicious for an epidural abscess related to facet joint infection. This abscess exerts some mass effect on the thecal sac. 3. Grossly stable paraspinal inflammatory changes with small abscesses in the erector spinae musculature on the right. 4. Endplate changes asymmetric to the left at L2-3 are nonspecific and probably discogenic as correlated with prior CTs. Disc space infection at this level cannot be completely excluded. 5. Progression of multilevel spondylosis from previous MRI with resulting spinal stenosis at multiple levels as detailed above. Electronically Signed   By: Carey Bullocks M.D.   On: 02/12/2016 17:08   Ir Fluoro Guide Ndl Plmt / Bx  02/15/2016  INDICATION: Lumbar discitis EXAM: IR FLUORO GUIDE NEEDLE PLACEMENT /BIOPSY MEDICATIONS: Fentanyl 25 mcg IV. ANESTHESIA/SEDATION: Moderate (conscious) sedation was employed during this procedure. A total of Versed 0 mg and Fentanyl 25 mcg was administered intravenously. Moderate Sedation Time: 0 minutes. The patient's level of consciousness and vital signs were monitored continuously by radiology nursing throughout the procedure under my direct supervision. FLUOROSCOPY TIME:  Fluoroscopy Time: 4 minutes 54 seconds  (147.2 mGy). COMPLICATIONS: None immediate. PROCEDURE: Informed written consent was obtained from the patient after a thorough discussion of the procedural risks, benefits and alternatives. All questions were addressed. Maximal Sterile Barrier Technique was utilized including caps, mask, sterile gowns, sterile gloves, sterile drape, hand hygiene and skin antiseptic. A timeout was performed prior to the initiation of the procedure. The patient was laid prone on the fluoroscopic table. The skin overlying the lumbar region was then prepped and draped in the usual sterile fashion. The skin entry site at L1-L2 was then infiltrated with 0.25% bupivacaine. This was then followed by the advancement under intermittent fluoroscopy in the biplane mode of an 021 Franseen biopsy needle into the L1-L2 disc space without event. Using a 20 mL syringe, thick bloody aspirate was obtained and sent  for pathologic analysis. Another pass was then made with a fresh needle as described above. Again thick bloody fluid was obtained and sent for analysis. A total of approximately 4 mL of thick, bloody fluid was obtained in total and sent for microbiologic analysis. Hemostasis was achieved at the skin entry site. Patient tolerated the procedure well. IMPRESSION: Status post fluoroscopic guided needle placement for aspiration at L1-L2 as described above without event. Electronically Signed   By: Julieanne Cotton M.D.   On: 02/13/2016 13:02   Dg Chest Portable 1 View  02/09/2016  CLINICAL DATA:  PICC placement. EXAM: PORTABLE CHEST 1 VIEW COMPARISON:  Chest radiograph 01/06/2016. Included lung bases from CT abdomen/pelvis earlier this day FINDINGS: Tip of the right upper extremity PICC in the proximal SVC. Hazy opacity at the right lung base, pleural effusion and atelectasis as seen on CT. The lungs are otherwise clear. The heart size is normal. There is no pneumothorax. IMPRESSION: Tip of the right upper extremity PICC in the mid proximal  SVC. No pneumothorax. Right basilar opacity, pleural effusion and atelectasis. Electronically Signed   By: Rubye Oaks M.D.   On: 02/09/2016 18:50   Ct Temporal Bones W/o Cm  02/13/2016  CLINICAL DATA:  Right mastoid pain, several days duration. EXAM: CT TEMPORAL BONES WITHOUT CONTRAST TECHNIQUE: Axial and coronal plane CT imaging of the petrous temporal bones was performed with thin-collimation image reconstruction. No intravenous contrast was administered. Multiplanar CT image reconstructions were also generated. COMPARISON:  CT 01/06/2016.  MRI 01/04/2016.  MRI 03/13/2012. FINDINGS: There are a few opacified mastoid air cells at the tip and laterally, similar in appearance to not only the MRI of 1 month ago but also the MRI of 2013. The majority of the mastoid air cells are clear. There is no evidence of bone destruction or adjacent soft tissue reaction. No fluid in the middle ear. Ossicles are normal. Inner ear structures are normal. On the left, mastoid air cells are entirely clear. No abnormality in the middle ear. Inner ear structures appear normal as well. Temporomandibular joints appear unremarkable. IMPRESSION: No abnormality seen that would be expected to be painful. There are a few mastoid air cells at the tip and laterally that are chronically opacified, as far back as 2013. No evidence of bone erosion or adjacent soft tissue inflammatory change. Electronically Signed   By: Paulina Fusi M.D.   On: 02/13/2016 15:38    Microbiology: Recent Results (from the past 240 hour(s))  Culture, blood (Routine X 2) w Reflex to ID Panel     Status: None   Collection Time: 02/09/16  7:37 PM  Result Value Ref Range Status   Specimen Description BLOOD LEFT ANTECUBITAL  Final   Special Requests IN PEDIATRIC BOTTLE 1CC  Final   Culture NO GROWTH 5 DAYS  Final   Report Status 02/14/2016 FINAL  Final  Culture, blood (Routine X 2) w Reflex to ID Panel     Status: None   Collection Time: 02/09/16  7:47 PM   Result Value Ref Range Status   Specimen Description BLOOD RIGHT HAND  Final   Special Requests IN PEDIATRIC BOTTLE 1CC  Final   Culture NO GROWTH 5 DAYS  Final   Report Status 02/14/2016 FINAL  Final  Urine culture     Status: None   Collection Time: 02/10/16  1:40 AM  Result Value Ref Range Status   Specimen Description URINE, CATHETERIZED  Final   Special Requests Normal  Final  Culture >=100,000 COLONIES/mL ESCHERICHIA COLI  Final   Report Status 02/12/2016 FINAL  Final   Organism ID, Bacteria ESCHERICHIA COLI  Final      Susceptibility   Escherichia coli - MIC*    AMPICILLIN >=32 RESISTANT Resistant     CEFAZOLIN >=64 RESISTANT Resistant     CEFTRIAXONE <=1 SENSITIVE Sensitive     CIPROFLOXACIN >=4 RESISTANT Resistant     GENTAMICIN <=1 SENSITIVE Sensitive     IMIPENEM <=0.25 SENSITIVE Sensitive     NITROFURANTOIN <=16 SENSITIVE Sensitive     TRIMETH/SULFA 160 RESISTANT Resistant     AMPICILLIN/SULBACTAM >=32 RESISTANT Resistant     PIP/TAZO >=128 RESISTANT Resistant     * >=100,000 COLONIES/mL ESCHERICHIA COLI  MRSA PCR Screening     Status: None   Collection Time: 02/10/16  5:10 AM  Result Value Ref Range Status   MRSA by PCR NEGATIVE NEGATIVE Final    Comment:        The GeneXpert MRSA Assay (FDA approved for NASAL specimens only), is one component of a comprehensive MRSA colonization surveillance program. It is not intended to diagnose MRSA infection nor to guide or monitor treatment for MRSA infections.   Anaerobic culture     Status: None (Preliminary result)   Collection Time: 02/13/16  1:10 PM  Result Value Ref Range Status   Specimen Description NEEDLE ASPIRATE  Final   Special Requests L1 L2 DISC SPACE  Final   Gram Stain   Final    RARE WBC PRESENT, PREDOMINANTLY PMN NO SQUAMOUS EPITHELIAL CELLS SEEN NO ORGANISMS SEEN Performed at [facility]    Culture   Final    NO ANAEROBES ISOLATED; CULTURE IN PROGRESS FOR 5 DAYS Performed at  [facility]    Report Status PENDING  Incomplete  Culture, routine-abscess     Status: None   Collection Time: 02/13/16  1:10 PM  Result Value Ref Range Status   Specimen Description NEEDLE ASPIRATE  Final   Special Requests L1 L2 DISC SPACE  Final   Gram Stain   Final    RARE WBC PRESENT, PREDOMINANTLY PMN NO SQUAMOUS EPITHELIAL CELLS SEEN NO ORGANISMS SEEN Performed at [facility]    Culture   Final    NO GROWTH 2 DAYS Performed at [facility]    Report Status 02/16/2016 FINAL  Final  Acid Fast Smear (AFB)     Status: None   Collection Time: 02/13/16  1:10 PM  Result Value Ref Range Status   AFB Specimen Processing Concentration  Final   Acid Fast Smear Negative  Final    Comment: (NOTE) Performed At: [facility] [address] [place], [place] [id number] Mila Homer MD WU:[id number]    Source (AFB) NEEDLE ASPIRATE  Final    Comment: L1L2 DISC SPACE     Labs: Basic Metabolic Panel:  Recent Labs Lab 02/10/16 0344  02/11/16 0355 02/12/16 0420 02/13/16 0454 02/14/16 0341 02/15/16 0325  NA 136  < > 137 136 136 136 135  K 3.5  < > 3.1* 3.3* 3.3* 3.3* 3.4*  CL 102  < > 103 100* 98* 96* 97*  CO2 21*  < > 26 24 28 28 28   GLUCOSE 133*  < > 101* 119* 95 107* 95  BUN <5*  < > <5* <5* <5* 7 6  CREATININE 0.65  < > 0.71 0.77 0.76 0.79 0.81  CALCIUM 8.2*  < > 8.1* 8.0* 8.2* 8.6* 8.6*  MG 1.8  --  1.7 2.1  --   --   --   < > = values in this interval not displayed. Liver Function Tests:  Recent Labs Lab 02/11/16 0355 02/12/16 0420 02/13/16 0454 02/14/16 0341 02/15/16 0325  AST 12* 12* 14* 18 20  ALT 9* 8* 8* 9* 11*  ALKPHOS 67 67 74 81 78  BILITOT 1.4* 1.7* 1.9* 2.1* 1.8*  PROT 5.4* 5.4* 6.0* 6.0* 6.1*  ALBUMIN 1.2* 1.3* 1.4* 1.5* 1.4*   No results for input(s): LIPASE, AMYLASE in the last 168 hours. No results for input(s): AMMONIA in the last 168 hours. CBC:  Recent Labs Lab 02/09/16 1947  02/12/16 0420  02/13/16 0454 02/14/16 0341 02/15/16 0325 02/16/16 0500  WBC 11.6*  < > 8.4 9.1 8.9 7.5 6.2  NEUTROABS 7.4  --   --   --   --   --   --   HGB 8.3*  < > 8.4* 9.1* 9.3* 9.1* 8.9*  HCT 25.6*  < > 26.1* 28.5* 29.2* 28.1* 27.7*  MCV 88.0  < > 87.6 88.2 89.0 88.4 89.4  PLT 447*  < > 396 436* 422* 397 387  < > = values in this interval not displayed. Cardiac Enzymes: No results for input(s): CKTOTAL, CKMB, CKMBINDEX, TROPONINI in the last 168 hours. BNP: BNP (last 3 results)  Recent Labs  01/01/16 1142  BNP 432.0*    ProBNP (last 3 results) No results for input(s): PROBNP in the last 8760 hours.  CBG:  Recent Labs Lab 02/12/16 0743 02/13/16 0756 02/14/16 0602 02/15/16 0612 02/16/16 0643  GLUCAP 81 73 113* 95 121*    Signed:  Penny Pia MD.  [facility] 02/16/2016, 2:30 PM

## 2016-02-16 NOTE — Clinical Social Work Note (Signed)
BLUE Medicare authorization obtained: [id number].  Clinical Social Worker facilitated patient discharge including contacting patient family and facility to confirm patient discharge plans.  Clinical information faxed to facility and family agreeable with plan.  CSW [facility].  RN to call report prior to discharge.  Clinical Social Worker will sign off for now as social work intervention is no longer needed. Please consult us again if new need arises.  Derenda FennelBashira Merrick Feutz, MSW, LCSWA [phone] 02/16/2016 2:43 PM

## 2016-02-17 DIAGNOSIS — I2699 Other pulmonary embolism without acute cor pulmonale: Secondary | ICD-10-CM | POA: Diagnosis not present

## 2016-02-17 DIAGNOSIS — R531 Weakness: Secondary | ICD-10-CM | POA: Diagnosis not present

## 2016-02-17 DIAGNOSIS — I739 Peripheral vascular disease, unspecified: Secondary | ICD-10-CM | POA: Diagnosis not present

## 2016-02-17 DIAGNOSIS — A419 Sepsis, unspecified organism: Secondary | ICD-10-CM | POA: Diagnosis not present

## 2016-02-17 DIAGNOSIS — N1330 Unspecified hydronephrosis: Secondary | ICD-10-CM | POA: Diagnosis not present

## 2016-02-17 DIAGNOSIS — I509 Heart failure, unspecified: Secondary | ICD-10-CM | POA: Diagnosis not present

## 2016-02-17 DIAGNOSIS — I10 Essential (primary) hypertension: Secondary | ICD-10-CM | POA: Diagnosis not present

## 2016-02-17 DIAGNOSIS — I82402 Acute embolism and thrombosis of unspecified deep veins of left lower extremity: Secondary | ICD-10-CM | POA: Diagnosis not present

## 2016-02-17 DIAGNOSIS — I639 Cerebral infarction, unspecified: Secondary | ICD-10-CM | POA: Diagnosis not present

## 2016-02-17 DIAGNOSIS — Z89439 Acquired absence of unspecified foot: Secondary | ICD-10-CM | POA: Diagnosis not present

## 2016-02-18 LAB — ANAEROBIC CULTURE

## 2016-02-21 DIAGNOSIS — I2699 Other pulmonary embolism without acute cor pulmonale: Secondary | ICD-10-CM | POA: Diagnosis not present

## 2016-02-21 DIAGNOSIS — D735 Infarction of spleen: Secondary | ICD-10-CM | POA: Diagnosis not present

## 2016-02-21 DIAGNOSIS — R339 Retention of urine, unspecified: Secondary | ICD-10-CM | POA: Diagnosis not present

## 2016-02-21 DIAGNOSIS — I2690 Septic pulmonary embolism without acute cor pulmonale: Secondary | ICD-10-CM | POA: Diagnosis not present

## 2016-02-24 DIAGNOSIS — I10 Essential (primary) hypertension: Secondary | ICD-10-CM | POA: Diagnosis not present

## 2016-02-24 DIAGNOSIS — I82402 Acute embolism and thrombosis of unspecified deep veins of left lower extremity: Secondary | ICD-10-CM | POA: Diagnosis not present

## 2016-02-24 DIAGNOSIS — Z418 Encounter for other procedures for purposes other than remedying health state: Secondary | ICD-10-CM | POA: Diagnosis not present

## 2016-02-24 DIAGNOSIS — Z89439 Acquired absence of unspecified foot: Secondary | ICD-10-CM | POA: Diagnosis not present

## 2016-02-24 DIAGNOSIS — I2699 Other pulmonary embolism without acute cor pulmonale: Secondary | ICD-10-CM | POA: Diagnosis not present

## 2016-02-24 DIAGNOSIS — I639 Cerebral infarction, unspecified: Secondary | ICD-10-CM | POA: Diagnosis not present

## 2016-02-24 DIAGNOSIS — I739 Peripheral vascular disease, unspecified: Secondary | ICD-10-CM | POA: Diagnosis not present

## 2016-03-01 DIAGNOSIS — R040 Epistaxis: Secondary | ICD-10-CM | POA: Diagnosis not present

## 2016-03-01 DIAGNOSIS — D735 Infarction of spleen: Secondary | ICD-10-CM | POA: Diagnosis not present

## 2016-03-01 DIAGNOSIS — R339 Retention of urine, unspecified: Secondary | ICD-10-CM | POA: Diagnosis not present

## 2016-03-01 DIAGNOSIS — I2690 Septic pulmonary embolism without acute cor pulmonale: Secondary | ICD-10-CM | POA: Diagnosis not present

## 2016-03-02 DIAGNOSIS — R338 Other retention of urine: Secondary | ICD-10-CM | POA: Diagnosis not present

## 2016-03-02 DIAGNOSIS — N1330 Unspecified hydronephrosis: Secondary | ICD-10-CM | POA: Diagnosis not present

## 2016-03-08 DIAGNOSIS — D735 Infarction of spleen: Secondary | ICD-10-CM | POA: Diagnosis not present

## 2016-03-08 DIAGNOSIS — I10 Essential (primary) hypertension: Secondary | ICD-10-CM | POA: Diagnosis not present

## 2016-03-08 DIAGNOSIS — I2690 Septic pulmonary embolism without acute cor pulmonale: Secondary | ICD-10-CM | POA: Diagnosis not present

## 2016-03-08 DIAGNOSIS — M4640 Discitis, unspecified, site unspecified: Secondary | ICD-10-CM | POA: Diagnosis not present

## 2016-03-13 LAB — FUNGUS CULTURE WITH STAIN

## 2016-03-13 LAB — FUNGAL ORGANISM REFLEX

## 2016-03-13 LAB — FUNGUS CULTURE RESULT

## 2016-03-15 DIAGNOSIS — D649 Anemia, unspecified: Secondary | ICD-10-CM | POA: Diagnosis not present

## 2016-03-15 DIAGNOSIS — J811 Chronic pulmonary edema: Secondary | ICD-10-CM | POA: Diagnosis not present

## 2016-03-15 DIAGNOSIS — M4646 Discitis, unspecified, lumbar region: Secondary | ICD-10-CM | POA: Diagnosis not present

## 2016-03-15 DIAGNOSIS — R7881 Bacteremia: Secondary | ICD-10-CM | POA: Diagnosis not present

## 2016-03-15 DIAGNOSIS — I82409 Acute embolism and thrombosis of unspecified deep veins of unspecified lower extremity: Secondary | ICD-10-CM | POA: Diagnosis not present

## 2016-03-15 DIAGNOSIS — N1330 Unspecified hydronephrosis: Secondary | ICD-10-CM | POA: Diagnosis not present

## 2016-03-15 DIAGNOSIS — I639 Cerebral infarction, unspecified: Secondary | ICD-10-CM | POA: Diagnosis not present

## 2016-03-22 ENCOUNTER — Ambulatory Visit: Payer: Medicare Other | Admitting: Internal Medicine

## 2016-03-22 IMAGING — MR MR LUMBAR SPINE WO/W CM
4 of 9 series · 16 of 48 positions shown · IV contrast (multihance)
Comparison: Abdominal pelvic CT 02/09/2016 and 01/09/2016. Lumbar
MRI 09/25/2012.

CLINICAL DATA: Endocarditis.  Follow up discitis.

EXAM:
MRI LUMBAR SPINE WITHOUT AND WITH CONTRAST
TECHNIQUE: Multiplanar and multiecho pulse sequences of the lumbar spine were
obtained without and with intravenous contrast.
CONTRAST:  17 ml MultiHance.

[Series 3: T2 · sagittal · 4.0mm · 0.55mm/px · 3 of 14 slices shown (1 of 2)]
[im 1/14]
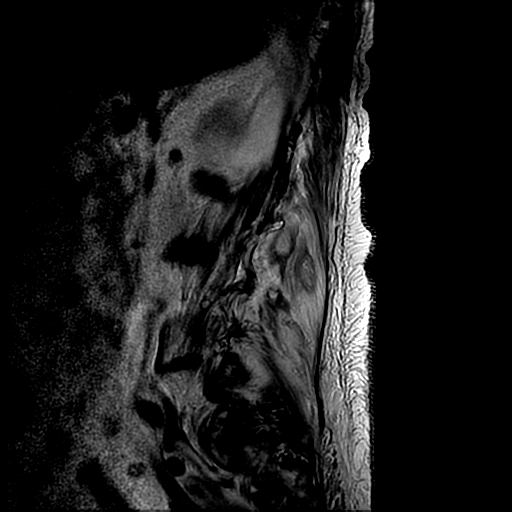
[im 7/14]
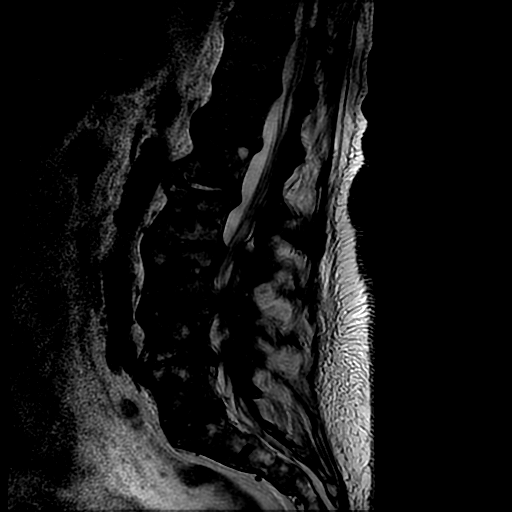
[im 14/14]
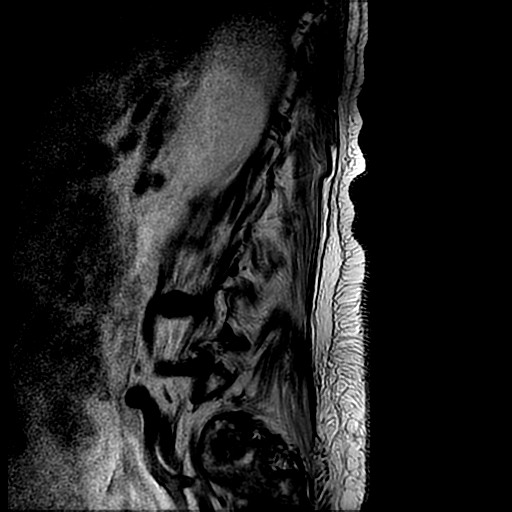

[Series 4: T1 · sagittal · 4.0mm · 0.55mm/px · 3 of 14 slices shown (1 of 2)]
[im 1/14]
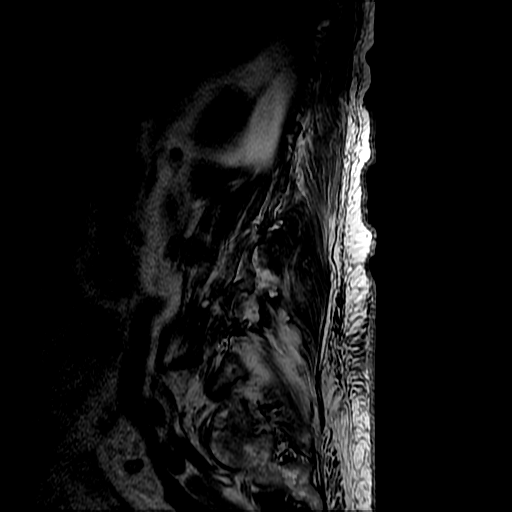
[im 7/14]
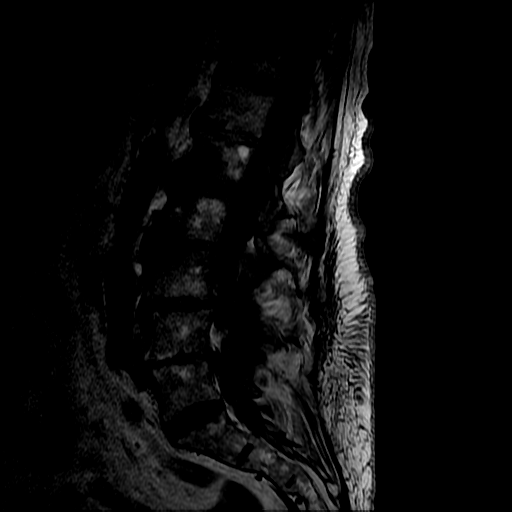
[im 14/14]
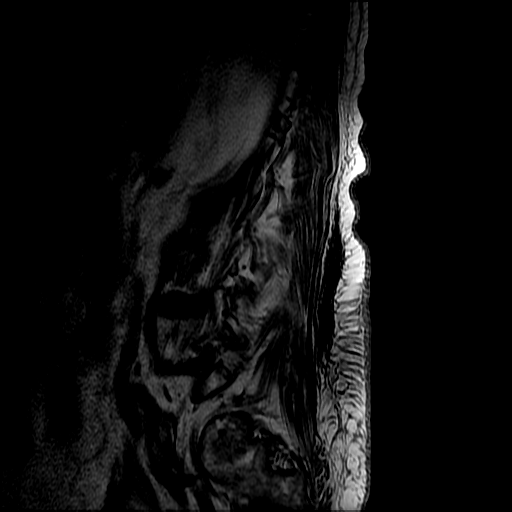

[Series 6: T2 · axial · 4.0mm · 0.39mm/px · z∈[-21,+144]mm · 7 of 40 slices shown (2 of 2)]
[im 1/40]
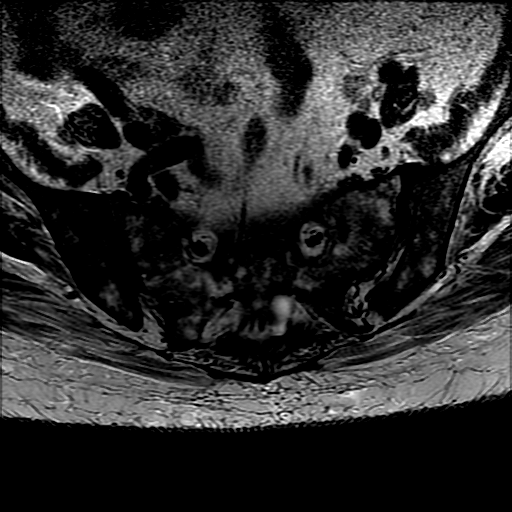
[im 6/40]
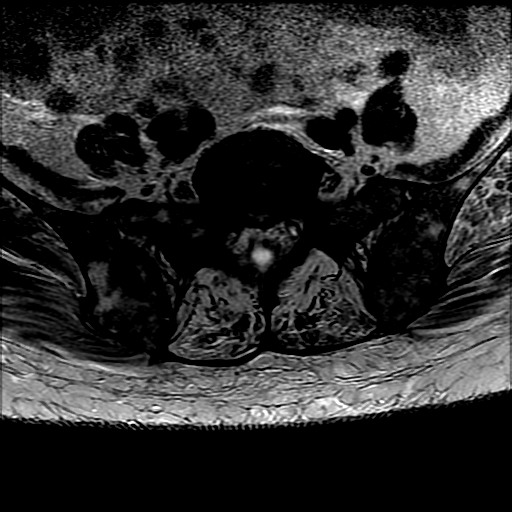
[im 12/40]
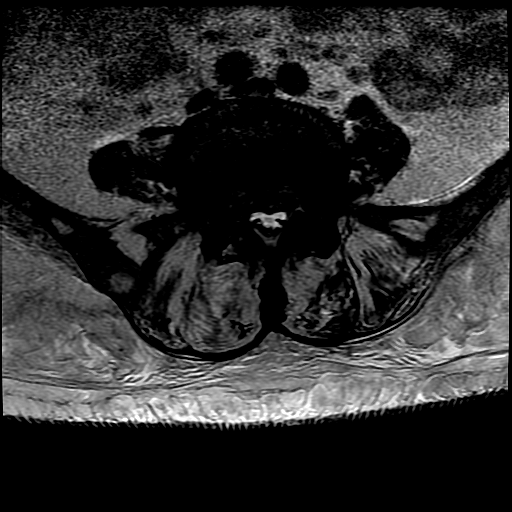
[im 17/40]
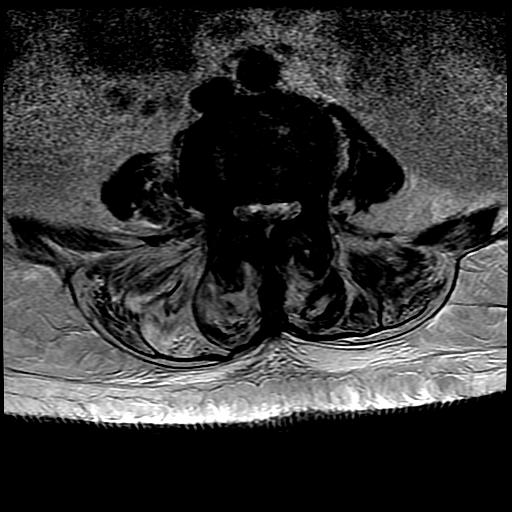
[im 23/40]
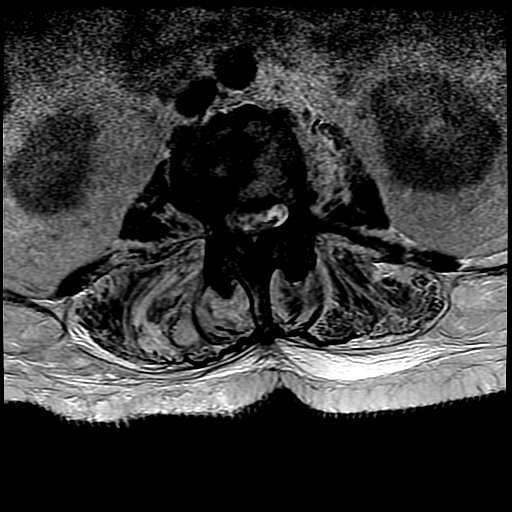
[im 28/40]
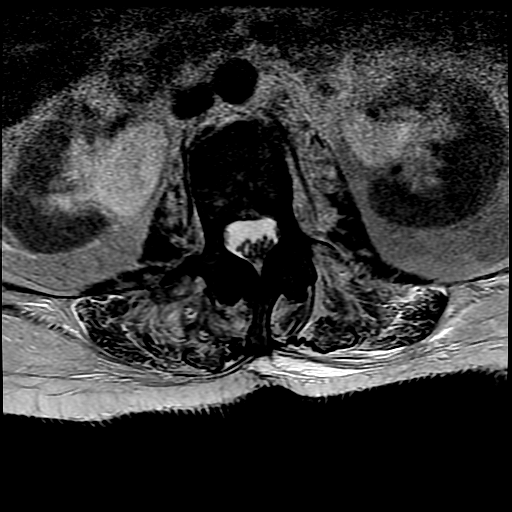
[im 34/40]
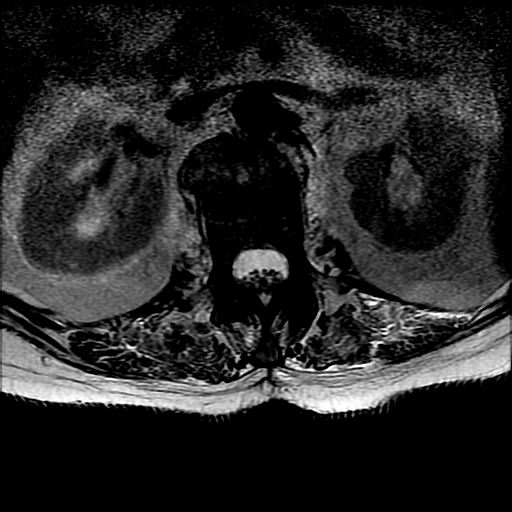

[Series 7: T1 · axial · 4.0mm · 0.39mm/px · z∈[+4,+144]mm · 3 of 40 slices shown (2 of 2)]
[im 6/40]
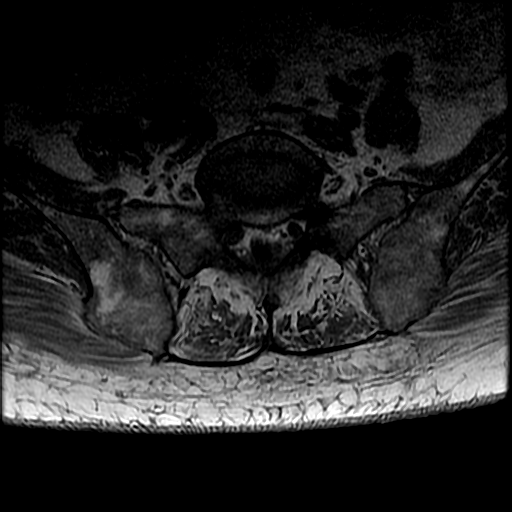
[im 23/40]
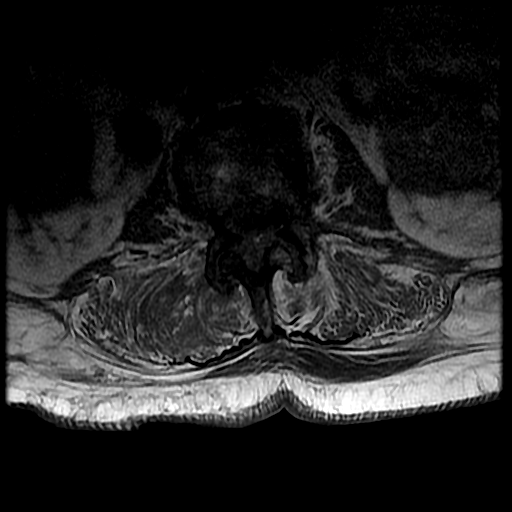
[im 34/40]
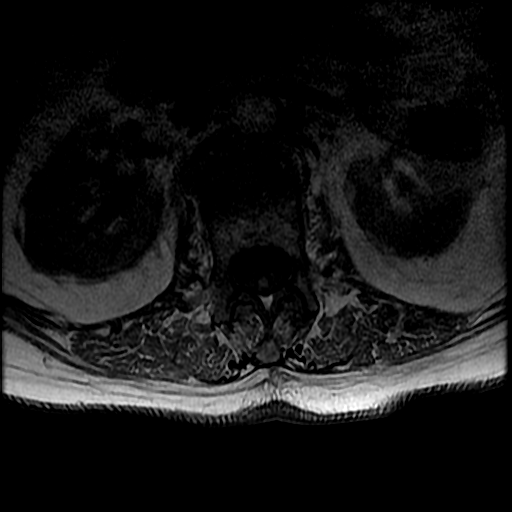

[16 of 48 positions shown; findings below may reference images not displayed]

FINDINGS: Segmentation: Conventional anatomy assumed, with the last open disc
space designated L5-S1.

Alignment: Stable mild convex right scoliosis with near anatomic
lateral alignment.

Bones: The bone marrow is diffusely heterogeneous. Corresponding
with the abnormalities demonstrated on recent CT is endplate edema
and enhancement at L1-2 suspicious for discitis/osteomyelitis. There
is corresponding T2 hyperintensity within the L1-2 disc. Endplate
signal changes on the left at L2-3 are associated with some T2
hyperintensity in the L2-3 disc, but are probably degenerative as
correlated with older prior studies. Patient also has extensive
asymmetric facet arthropathy on the right at L2-3 and L3-4 with
subchondral edema and enhancement. These joints may be infected as
well. The visualized sacroiliac joints appear unremarkable.

Conus medullaris: Extends to the L1 level and appears normal. No
abnormal intradural enhancement is seen. However, there is posterior
epidural enhancement surrounding a fluid collection extending
inferiorly from L3 into the mid sacrum, surrounding a pre-existing
Tarlov cyst. This is best seen on sagittal image number 8 of series
8. This is suspicious for a posterior epidural abscess, supporting
the possibility of an infected right-sided facet joint.

Paraspinal and other soft tissues: There is diffuse paraspinal edema
and enhancement, asymmetric to the right. As seen on recent CT,
there are small peripherally enhancing fluid collections within the
erector spinae musculature on the right, measuring up to 2 cm in
diameter. No focal fluid collection is seen within the psoas
musculature. There is stable dilatation of the right renal pelvis.

Disc levels:

There are stable degenerative changes in the lower thoracic spine
without resulting significant spinal stenosis or nerve root
encroachment.

L1-2: As above, probable findings of diskitis and osteomyelitis.
There is stable annular disc bulging, facet and ligamentous
hypertrophy contributing to mild mass effect on the thecal sac. No
epidural fluid collection identified at this level.

L2-3: Chronic degenerative disc disease with disc bulging,
osteophytes and endplate degeneration asymmetric to the left. These
findings have progressed from the 8136 CT. Disc space infection at
this level is difficult to completely exclude, although not strongly
suspected. There is asymmetric facet arthropathy on the right with a
potentially infected facet joint. Moderate multifactorial spinal
stenosis is present.

L3-4: Progressive annular disc bulging, facet and ligamentous
hypertrophy, contributing to moderate to severe spinal stenosis.
There is asymmetric narrowing of the right lateral recess and right
foramen. As above, there is potential infection of the right facet
joint with a posterior epidural fluid collection extending
inferiorly into the sacral canal.

L4-5: Chronic degenerative disc disease with loss of disc height and
endplate osteophytes. Mild facet and ligamentous hypertrophy.
Previously demonstrated right-sided disc extrusion has partially
involuted. There is stable mild spinal stenosis and mild narrowing
of the foramina.

L5-S1: Disc height and hydration are maintained. No significant
spinal stenosis or nerve root encroachment.
IMPRESSION: 1. MR findings are supportive of diskitis and osteomyelitis at L1-2,
as seen on recent CT.
2. In addition, there is concern of right-sided facet joint
infection at L2-3 and L3-4. There is an associated posterior
epidural fluid collection with peripheral enhancement extending from
L3 into the mid sacrum, suspicious for an epidural abscess related
to facet joint infection. This abscess exerts some mass effect on
the thecal sac.
3. Grossly stable paraspinal inflammatory changes with small
abscesses in the erector spinae musculature on the right.
4. Endplate changes asymmetric to the left at L2-3 are nonspecific
and probably discogenic as correlated with prior CTs. Disc space
infection at this level cannot be completely excluded.
5. Progression of multilevel spondylosis from previous MRI with
resulting spinal stenosis at multiple levels as detailed above.

## 2016-03-26 DIAGNOSIS — Q211 Atrial septal defect: Secondary | ICD-10-CM | POA: Diagnosis not present

## 2016-03-26 DIAGNOSIS — I6340 Cerebral infarction due to embolism of unspecified cerebral artery: Secondary | ICD-10-CM | POA: Diagnosis not present

## 2016-03-26 DIAGNOSIS — I330 Acute and subacute infective endocarditis: Secondary | ICD-10-CM | POA: Diagnosis not present

## 2016-03-26 DIAGNOSIS — R6520 Severe sepsis without septic shock: Secondary | ICD-10-CM | POA: Diagnosis not present

## 2016-03-26 DIAGNOSIS — E538 Deficiency of other specified B group vitamins: Secondary | ICD-10-CM | POA: Diagnosis not present

## 2016-03-26 DIAGNOSIS — Z6825 Body mass index (BMI) 25.0-25.9, adult: Secondary | ICD-10-CM | POA: Diagnosis not present

## 2016-03-26 DIAGNOSIS — E441 Mild protein-calorie malnutrition: Secondary | ICD-10-CM | POA: Diagnosis not present

## 2016-03-26 DIAGNOSIS — I2699 Other pulmonary embolism without acute cor pulmonale: Secondary | ICD-10-CM | POA: Diagnosis not present

## 2016-03-26 DIAGNOSIS — Z89429 Acquired absence of other toe(s), unspecified side: Secondary | ICD-10-CM | POA: Diagnosis not present

## 2016-03-26 DIAGNOSIS — E663 Overweight: Secondary | ICD-10-CM | POA: Diagnosis not present

## 2016-03-28 LAB — ACID FAST CULTURE WITH REFLEXED SENSITIVITIES (MYCOBACTERIA)

## 2016-03-28 LAB — ACID FAST CULTURE WITH REFLEXED SENSITIVITIES: ACID FAST CULTURE - AFSCU3: NEGATIVE

## 2016-04-05 ENCOUNTER — Encounter: Payer: Self-pay | Admitting: Internal Medicine

## 2016-04-05 ENCOUNTER — Ambulatory Visit (INDEPENDENT_AMBULATORY_CARE_PROVIDER_SITE_OTHER): Payer: Medicare Other | Admitting: Internal Medicine

## 2016-04-05 VITALS — BP 112/68 | HR 64 | Temp 97.3°F | Ht 68.5 in | Wt 157.0 lb

## 2016-04-05 DIAGNOSIS — M4646 Discitis, unspecified, lumbar region: Secondary | ICD-10-CM | POA: Diagnosis not present

## 2016-04-05 LAB — CBC WITH DIFFERENTIAL/PLATELET
Basophils Absolute: 0 cells/uL (ref 0–200)
Basophils Relative: 0 %
EOS PCT: 2 %
Eosinophils Absolute: 144 cells/uL (ref 15–500)
HCT: 34.5 % — ABNORMAL LOW (ref 35.0–45.0)
Hemoglobin: 11.2 g/dL — ABNORMAL LOW (ref 11.7–15.5)
LYMPHS ABS: 2304 {cells}/uL (ref 850–3900)
Lymphocytes Relative: 32 %
MCH: 32.2 pg (ref 27.0–33.0)
MCHC: 32.5 g/dL (ref 32.0–36.0)
MCV: 99.1 fL (ref 80.0–100.0)
MONO ABS: 648 {cells}/uL (ref 200–950)
MONOS PCT: 9 %
MPV: 10.3 fL (ref 7.5–12.5)
NEUTROS ABS: 4104 {cells}/uL (ref 1500–7800)
NEUTROS PCT: 57 %
PLATELETS: 307 10*3/uL (ref 140–400)
RBC: 3.48 MIL/uL — AB (ref 3.80–5.10)
RDW: 15 % (ref 11.0–15.0)
WBC: 7.2 10*3/uL (ref 3.8–10.8)

## 2016-04-05 LAB — BASIC METABOLIC PANEL
BUN: 22 mg/dL (ref 7–25)
CO2: 26 mmol/L (ref 20–31)
Calcium: 9.8 mg/dL (ref 8.6–10.4)
Chloride: 100 mmol/L (ref 98–110)
Creat: 1.13 mg/dL — ABNORMAL HIGH (ref 0.60–0.88)
GLUCOSE: 125 mg/dL — AB (ref 65–99)
POTASSIUM: 4.9 mmol/L (ref 3.5–5.3)
SODIUM: 136 mmol/L (ref 135–146)

## 2016-04-05 LAB — C-REACTIVE PROTEIN

## 2016-04-05 MED ORDER — TRAMADOL HCL 50 MG PO TABS: 50.0000 mg | ORAL_TABLET | Freq: Two times a day (BID) | ORAL | Status: DC | PRN

## 2016-04-06 LAB — SEDIMENTATION RATE: SED RATE: 32 mm/h — AB (ref 0–30)

## 2016-04-10 ENCOUNTER — Telehealth: Payer: Self-pay | Admitting: *Deleted

## 2016-04-10 NOTE — Telephone Encounter (Signed)
Patient's daughter calling for results of bloodwork from last office visit.  Please advise. Andree CossHowell, Theadora Noyes M, RN

## 2016-04-11 NOTE — Telephone Encounter (Signed)
i called patient with results

## 2016-04-13 NOTE — Progress Notes (Signed)
Subjective:    Patient ID: Cassandra [name], female    DOB: 02/05/1933, 82 y.o.   MRN: [id number]  HPI  82 yo F with complicated disseminated infection due to MSSA bacteremia in late Jan 2017 which involved likely left foot osteo, found to have large MV vegetation with 2.3cm strand but also evidence of CNS emboli due to multiple punctate infarct. She had evidence on CT of splenic infarct. SHe was treated with nafcillin, plus underwent L TM amputation on 2/5 , but not felt to be a candidate for MVR at that time. She was on track to be on abtx through 3/20 but was readmitted to the hospital on march 9th for flank pain where she was found to have evidence of L1-2 discitis and paraspinal abscess, likely from MSSA infection. Lumbar aspirate on 3/13 had negative cultures. She was discharged on 3 additional weeks of nafcillin which she completed. She finished at mid-late April. She is feeling better that she is back home. Getting her strength back. She was recently fitted for shoe for her left TM amputation. She states back pain somewhat improved  Allergies  Allergen Reactions  . Codeine Shortness Of Breath and Nausea And Vomiting  . Hydrocodone Shortness Of Breath and Nausea And Vomiting   Current Outpatient Prescriptions on File Prior to Visit  Medication Sig Dispense Refill  . apixaban (ELIQUIS) 5 MG TABS tablet Take 2 tablets (10 mg total) by mouth 2 (two) times daily. 8 tablet 0  . atenolol (TENORMIN) 100 MG tablet Take 1 tablet by mouth daily.    . Biotin 5000 MCG CAPS Take 10,000 mcg by mouth daily.     . Calcium Carbonate-Vitamin D (CALCIUM + D) 600-200 MG-UNIT TABS Take 1 tablet by mouth daily.    . carboxymethylcellulose (REFRESH PLUS) 0.5 % SOLN Place 1 drop into both eyes 3 (three) times daily as needed (Dry Eyes).    . Cyanocobalamin (VITAMIN B-12 IJ) Inject as directed every 30 (thirty) days.    . feeding supplement, ENSURE ENLIVE, (ENSURE ENLIVE) LIQD Take 237 mLs by mouth 2 (two)  times daily between meals. 237 mL 12  . furosemide (LASIX) 40 MG tablet Take 1 tablet (40 mg total) by mouth daily. 30 tablet 0  . isosorbide mononitrate (IMDUR) 30 MG 24 hr tablet Take 1 tablet (30 mg total) by mouth daily.    [name]. latanoprost (XALATAN) 0.005 % ophthalmic solution Place 1 drop into both eyes at bedtime.    [name]. losartan (COZAAR) 100 MG tablet Take 100 mg by mouth daily.  11  . Multiple Vitamins-Minerals (PRESERVISION AREDS PO) Take 1 tablet by mouth 2 (two) times daily.    [name]. nystatin (MYCOSTATIN/NYSTOP) 100000 UNIT/GM POWD Apply 1 Bottle topically 2 (two) times daily.    . ondansetron (ZOFRAN ODT) 4 MG disintegrating tablet 4mg  ODT q4 hours prn nausea/vomit 4 tablet 0  . potassium chloride (KLOR-CON) 20 MEQ packet Take 20 mEq by mouth daily.    . ranitidine (ZANTAC) 300 MG tablet Take 1 tablet (300 mg total) by mouth at bedtime.    . saccharomyces boulardii (FLORASTOR) 250 MG capsule Take 250 mg by mouth 2 (two) times daily.    . sertraline (ZOLOFT) 100 MG tablet Take 100 mg by mouth daily.     [name]. SIMBRINZA 1-0.2 % SUSP Place 1 drop into both eyes 3 (three) times daily.    . simvastatin (ZOCOR) 10 MG tablet Take 10 mg by mouth at bedtime.     No  current facility-administered medications on file prior to visit.   Active Ambulatory Problems    Diagnosis Date Noted  . Hypertension   . Hyperlipidemia   . PFO (patent foramen ovale)   . H/O echocardiogram   . Heart palpitations   . Septic arthritis of interphalangeal joint of toe of left foot (HCC) 09/16/2014  . Cellulitis and abscess of toe of left foot 09/16/2014  . Osteomyelitis of toe of left foot (HCC) 09/16/2014  . Normocytic anemia 09/17/2014  . Gastroenteritis 12/29/2015  . Glaucoma 12/29/2015  . Staphylococcus aureus bacteremia 01/01/2016  . Diminished pulses in lower extremity   . Emesis   . Bacteremia   . Sepsis (HCC) 01/05/2016  . Cerebral embolism with cerebral infarction 01/06/2016  . Bacterial endocarditis   .  Chronic osteomyelitis of left foot (HCC)   . Chest tightness 01/08/2016  . Chest pain   . Shortness of breath   . Loose stools   . Altered mental status   . Open wound of left foot   . S/P amputation of foot (HCC)   . GERD (gastroesophageal reflux disease) 02/09/2016  . Acute pulmonary embolism (HCC) 02/10/2016  . PE (pulmonary embolism) 02/10/2016  . Discitis of lumbar region    Resolved Ambulatory Problems    Diagnosis Date Noted  . Cellulitis 07/18/2014  . Ulcer of right foot (HCC) 07/18/2014  . UTI (urinary tract infection) 07/18/2014   Past Medical History  Diagnosis Date  . Arthritis   . Depression   . H/O cardiovascular stress test 10/13/2007  . Hiatal hernia    Social History  Substance Use Topics  . Smoking status: Never Smoker   . Smokeless tobacco: Never Used  . Alcohol Use: No  family history includes Cancer in her father; Heart disease in her mother; Hyperlipidemia in her son; Hypertension in her daughter; Kidney disease in her brother.  Review of Systems Deconditioned.otherwise 10 point ros is negative    Objective:   Physical Exam  BP 112/68 mmHg  Pulse 64  Temp(Src) 97.3 F (36.3 C) (Oral)  Ht 5' 8.5" (1.74 m)  Wt 157 lb (71.215 kg)  BMI 23.52 kg/m2 Physical Exam  Constitutional:  oriented to person, place, and time. appears well-developed and well-nourished. No distress.  HENT: [place]/AT, PERRLA, no scleral icterus Mouth/Throat: Oropharynx is clear and moist. No oropharyngeal exudate.  Cardiovascular: Normal rate, regular rhythm and normal heart sounds. Exam reveals no gallop and no friction rub. Soft 2/6 murmur BH apex Pulmonary/Chest: Effort normal and breath sounds normal. No respiratory distress.  has no wheezes.  Neck = supple, no nuchal rigidity Skin: Skin is warm and dry. No rash noted. No erythema. Well healed left TM amputaiton Psychiatric: a normal mood and affect.  behavior is normal.   Lab Results  Component Value Date   ESRSEDRATE  32* 04/05/2016   Lab Results  Component Value Date   CRP <0.5 04/05/2016      Assessment & Plan:  MSSA disseminated infection including NVE of mitral valve with emboli to CNS, spleen, lung, and lumbar discitis, and left foot osteo/sp TM amputation. Had been on 8wk of IV abtx+ - slowly improving - will check inflammatory markers to see that they are improving - she has been on prolonged abtx, may need addn 4 wk of oral abt if sed rate still elevated - will establish follow up with cardiology for repeat TTE

## 2016-05-03 DIAGNOSIS — D519 Vitamin B12 deficiency anemia, unspecified: Secondary | ICD-10-CM | POA: Diagnosis not present

## 2016-05-03 DIAGNOSIS — Z6824 Body mass index (BMI) 24.0-24.9, adult: Secondary | ICD-10-CM | POA: Diagnosis not present

## 2016-05-03 DIAGNOSIS — M5136 Other intervertebral disc degeneration, lumbar region: Secondary | ICD-10-CM | POA: Diagnosis not present

## 2016-05-03 DIAGNOSIS — Z1389 Encounter for screening for other disorder: Secondary | ICD-10-CM | POA: Diagnosis not present

## 2016-05-07 ENCOUNTER — Telehealth: Payer: Self-pay | Admitting: *Deleted

## 2016-05-07 NOTE — Telephone Encounter (Signed)
Patient asking if she needs to keep her appointment 6/8.  She states she saw her primary physician last week and "everything looks great."  Patient states this is a financial issue, as she is seeing many physicians since her discharge and has to pay a copay at each one.  She has also had to pay for additional medications.  She is unsure if she is still on medication prescribed by [facility]. Please advise if she needs to be seen 6/8, if she can push the follow up out further, or if she can just follow up with her primary physician. Andree CossHowell, Winfield Caba M, RN

## 2016-05-08 NOTE — Telephone Encounter (Signed)
Please advise so patient can arrange transportation/copay coverage.

## 2016-05-09 NOTE — Telephone Encounter (Signed)
Per Dr. Drue SecondSnider, patient can push back her follow up appointment.  Notified patient. Patient states she is feeling better, slowly regaining strength, is eating better. She is not yet driving. She has lost most of her hair, but feels it is starting to grow back.  She is returning to her primary physician for bloodwork in August. Would you like any labwork at that time? When should she return to [facility]?

## 2016-05-10 ENCOUNTER — Ambulatory Visit: Payer: Medicare Other | Admitting: Internal Medicine

## 2016-05-24 DIAGNOSIS — H401133 Primary open-angle glaucoma, bilateral, severe stage: Secondary | ICD-10-CM | POA: Diagnosis not present

## 2016-05-24 DIAGNOSIS — H04123 Dry eye syndrome of bilateral lacrimal glands: Secondary | ICD-10-CM | POA: Diagnosis not present

## 2016-06-01 ENCOUNTER — Other Ambulatory Visit: Payer: Self-pay | Admitting: Family Medicine

## 2016-06-01 DIAGNOSIS — Z1231 Encounter for screening mammogram for malignant neoplasm of breast: Secondary | ICD-10-CM

## 2016-06-07 DIAGNOSIS — E538 Deficiency of other specified B group vitamins: Secondary | ICD-10-CM | POA: Diagnosis not present

## 2016-06-15 ENCOUNTER — Ambulatory Visit
Admission: RE | Admit: 2016-06-15 | Discharge: 2016-06-15 | Disposition: A | Discharge status: Home / Self Care | Payer: Medicare Other | Source: Ambulatory Visit | Attending: Family Medicine | Admitting: Family Medicine

## 2016-06-15 DIAGNOSIS — Z1231 Encounter for screening mammogram for malignant neoplasm of breast: Secondary | ICD-10-CM

## 2016-06-18 DIAGNOSIS — S98312D Complete traumatic amputation of left midfoot, subsequent encounter: Secondary | ICD-10-CM | POA: Diagnosis not present

## 2016-07-06 DIAGNOSIS — D519 Vitamin B12 deficiency anemia, unspecified: Secondary | ICD-10-CM | POA: Diagnosis not present

## 2016-07-23 DIAGNOSIS — E538 Deficiency of other specified B group vitamins: Secondary | ICD-10-CM | POA: Diagnosis not present

## 2016-07-23 DIAGNOSIS — R7309 Other abnormal glucose: Secondary | ICD-10-CM | POA: Diagnosis not present

## 2016-07-23 DIAGNOSIS — I10 Essential (primary) hypertension: Secondary | ICD-10-CM | POA: Diagnosis not present

## 2016-07-23 DIAGNOSIS — Z6825 Body mass index (BMI) 25.0-25.9, adult: Secondary | ICD-10-CM | POA: Diagnosis not present

## 2016-07-23 DIAGNOSIS — Z1389 Encounter for screening for other disorder: Secondary | ICD-10-CM | POA: Diagnosis not present

## 2016-07-25 ENCOUNTER — Other Ambulatory Visit: Payer: Self-pay | Admitting: Internal Medicine

## 2016-08-07 DIAGNOSIS — D519 Vitamin B12 deficiency anemia, unspecified: Secondary | ICD-10-CM | POA: Diagnosis not present

## 2016-08-13 DIAGNOSIS — N289 Disorder of kidney and ureter, unspecified: Secondary | ICD-10-CM | POA: Diagnosis not present

## 2016-08-13 DIAGNOSIS — N390 Urinary tract infection, site not specified: Secondary | ICD-10-CM | POA: Diagnosis not present

## 2016-08-13 DIAGNOSIS — Z1389 Encounter for screening for other disorder: Secondary | ICD-10-CM | POA: Diagnosis not present

## 2016-08-13 DIAGNOSIS — Z6825 Body mass index (BMI) 25.0-25.9, adult: Secondary | ICD-10-CM | POA: Diagnosis not present

## 2016-09-10 DIAGNOSIS — D519 Vitamin B12 deficiency anemia, unspecified: Secondary | ICD-10-CM | POA: Diagnosis not present

## 2016-09-20 DIAGNOSIS — H04123 Dry eye syndrome of bilateral lacrimal glands: Secondary | ICD-10-CM | POA: Diagnosis not present

## 2016-09-20 DIAGNOSIS — H401133 Primary open-angle glaucoma, bilateral, severe stage: Secondary | ICD-10-CM | POA: Diagnosis not present

## 2016-10-05 DIAGNOSIS — Z23 Encounter for immunization: Secondary | ICD-10-CM | POA: Diagnosis not present

## 2016-10-05 DIAGNOSIS — D510 Vitamin B12 deficiency anemia due to intrinsic factor deficiency: Secondary | ICD-10-CM | POA: Diagnosis not present

## 2016-11-07 ENCOUNTER — Encounter: Payer: Self-pay | Admitting: Cardiovascular Disease

## 2016-11-07 ENCOUNTER — Ambulatory Visit (INDEPENDENT_AMBULATORY_CARE_PROVIDER_SITE_OTHER): Payer: Medicare Other | Admitting: Cardiovascular Disease

## 2016-11-07 VITALS — BP 126/68 | HR 62 | Ht 68.5 in | Wt 165.2 lb

## 2016-11-07 DIAGNOSIS — I10 Essential (primary) hypertension: Secondary | ICD-10-CM | POA: Diagnosis not present

## 2016-11-07 DIAGNOSIS — I330 Acute and subacute infective endocarditis: Secondary | ICD-10-CM | POA: Diagnosis not present

## 2016-11-07 DIAGNOSIS — E7800 Pure hypercholesterolemia, unspecified: Secondary | ICD-10-CM | POA: Diagnosis not present

## 2016-11-07 NOTE — Assessment & Plan Note (Signed)
History of hypertension blood pressure measures 126/68. She is on atenolol and losartan. Continue current meds at current dosing

## 2016-11-07 NOTE — Assessment & Plan Note (Signed)
History of bacterial endocarditis most likely from osteomyelitis on prolonged anticoagulation. Recent 2-D echo performed 02/14/16 revealed normal LV systolic function with moderate MR and TR.

## 2016-11-07 NOTE — Assessment & Plan Note (Signed)
History of pulmonary embolus in March of this year on Eliquis oral anticoagulation.

## 2016-11-07 NOTE — Progress Notes (Signed)
11/07/2016 Cassandra [name]   05/17/1933  [id number]  Primary Physician Colette RibasGOLDING, JOHN CABOT, MD Primary Cardiologist: Runell GessJonathan J Lahna Nath MD Roseanne RenoFACP, FACC, FAHA, FSCAI  HPI:  The patient is a 82 year old, mildly overweight, married Caucasian female, mother of 3, grandmother to 4 grandchildren who I last saw in the office 11/08/15.[name]. Her risk factors for heart disease are positive for hyperlipidemia as well as hypertension. She was cath'd November 2003 revealing normal coronary arteries and normal left ventricular function. She does have a history of a small PFO which she is asymptomatic from. Her last echo performed in 2008 was normal. She is otherwise asymptomatic. Dr. Phillips OdorGolding apparently follows her lab work including lipid profile in his office. She continues to have issues with a slowly healing ulcer on the bottom of her left foot which is followed by her podiatrist, Dr. Nolen MuMcKinney with "offloading". Since I saw her last she has had an uneventful year. She's had osteomyelitis and transmetatarsal amputation by Dr. Victorino DikeHewitt. She had subacute bacterial endocarditis on long-term IV antibiotic therapy via PICC line as well as pulmonary embolus currently on oral anticoagulation.   Current Outpatient Prescriptions  Medication Sig Dispense Refill  . apixaban (ELIQUIS) 5 MG TABS tablet Take 2 tablets (10 mg total) by mouth 2 (two) times daily. 8 tablet 0  . atenolol (TENORMIN) 100 MG tablet Take 1 tablet by mouth daily.    . Biotin 5000 MCG CAPS Take 10,000 mcg by mouth daily.     . Calcium Carbonate-Vitamin D (CALCIUM + D) 600-200 MG-UNIT TABS Take 1 tablet by mouth daily.    . carboxymethylcellulose (REFRESH PLUS) 0.5 % SOLN Place 1 drop into both eyes 3 (three) times daily as needed (Dry Eyes).    . Cyanocobalamin (VITAMIN B-12 IJ) Inject as directed every 30 (thirty) days.    . feeding supplement, ENSURE ENLIVE, (ENSURE ENLIVE) LIQD Take 237 mLs by mouth 2 (two) times daily between meals. 237 mL 12  .  furosemide (LASIX) 40 MG tablet Take 1 tablet (40 mg total) by mouth daily. 30 tablet 0  . isosorbide mononitrate (IMDUR) 30 MG 24 hr tablet Take 1 tablet (30 mg total) by mouth daily.    [name]. latanoprost (XALATAN) 0.005 % ophthalmic solution Place 1 drop into both eyes at bedtime.    [name]. losartan (COZAAR) 100 MG tablet Take 100 mg by mouth daily.  11  . Multiple Vitamins-Minerals (PRESERVISION AREDS PO) Take 1 tablet by mouth 2 (two) times daily.    . naproxen (NAPROSYN) 500 MG tablet Take 1 tablet by mouth as directed.  0  . nystatin (MYCOSTATIN/NYSTOP) 100000 UNIT/GM POWD Apply 1 Bottle topically 2 (two) times daily.    . ondansetron (ZOFRAN ODT) 4 MG disintegrating tablet 4mg  ODT q4 hours prn nausea/vomit 4 tablet 0  . pantoprazole (PROTONIX) 40 MG tablet Take 1 tablet by mouth daily.  1  . ranitidine (ZANTAC) 300 MG tablet Take 1 tablet (300 mg total) by mouth at bedtime.    . sertraline (ZOLOFT) 100 MG tablet Take 100 mg by mouth daily.     [name]. SIMBRINZA 1-0.2 % SUSP Place 1 drop into both eyes 3 (three) times daily.    . simvastatin (ZOCOR) 10 MG tablet Take 10 mg by mouth at bedtime.    . traMADol (ULTRAM) 50 MG tablet Take 1 tablet (50 mg total) by mouth every 12 (twelve) hours as needed for moderate pain. 30 tablet 0   No current facility-administered medications for this visit.  Allergies  Allergen Reactions  . Codeine Shortness Of Breath and Nausea And Vomiting  . Hydrocodone Shortness Of Breath and Nausea And Vomiting    Social History   Social History  . Marital status: Married    Spouse name: N/A  . Number of children: N/A  . Years of education: N/A   Occupational History  . Not on file.   Social History Main Topics  . Smoking status: Never Smoker  . Smokeless tobacco: Never Used  . Alcohol use No  . Drug use: No  . Sexual activity: Not on file   Other Topics Concern  . Not on file   Social History Narrative  . No narrative on file     Review of  Systems: General: negative for chills, fever, night sweats or weight changes.  Cardiovascular: negative for chest pain, dyspnea on exertion, edema, orthopnea, palpitations, paroxysmal nocturnal dyspnea or shortness of breath Dermatological: negative for rash Respiratory: negative for cough or wheezing Urologic: negative for hematuria Abdominal: negative for nausea, vomiting, diarrhea, bright red blood per rectum, melena, or hematemesis Neurologic: negative for visual changes, syncope, or dizziness All other systems reviewed and are otherwise negative except as noted above.    Blood pressure 126/68, pulse 62, height 5' 8.5" (1.74 m), weight 165 lb 3.2 oz (74.9 kg).  General appearance: alert and no distress Neck: no adenopathy, no carotid bruit, no JVD, supple, symmetrical, trachea midline and thyroid not enlarged, symmetric, no tenderness/mass/nodules Lungs: clear to auscultation bilaterally Heart: regular rate and rhythm, S1, S2 normal, no murmur, click, rub or gallop Extremities: extremities normal, atraumatic, no cyanosis or edema  EKG normal sinus rhythm at 62 without ST or T-wave changes. . I personally reviewed this EKG  ASSESSMENT AND PLAN:   Hypertension History of hypertension blood pressure measures 126/68. She is on atenolol and losartan. Continue current meds at current dosing    Hyperlipidemia History of hyperlipidemia on statin therapy with recent lipid profile performed 01/06/16 revealing a total cholesterol 132, LDL 71 and HDL of 19  Pulmonary embolism (HCC) History of pulmonary embolus in March of this year on Eliquis oral anticoagulation.  Bacterial endocarditis History of bacterial endocarditis most likely from osteomyelitis on prolonged anticoagulation. Recent 2-D echo performed 02/14/16 revealed normal LV systolic function with moderate MR and TR.      Runell GessJonathan J. Lucciana Head MD FACP,FACC,FAHA, [facility] Center[facility]SCAI 11/07/2016 10:27 AM

## 2016-11-07 NOTE — Assessment & Plan Note (Signed)
History of hyperlipidemia on statin therapy with recent lipid profile performed 01/06/16 revealing a total cholesterol 132, LDL 71 and HDL of 19

## 2016-11-07 NOTE — Patient Instructions (Signed)

## 2016-11-09 DIAGNOSIS — Z1389 Encounter for screening for other disorder: Secondary | ICD-10-CM | POA: Diagnosis not present

## 2016-11-09 DIAGNOSIS — N179 Acute kidney failure, unspecified: Secondary | ICD-10-CM | POA: Diagnosis not present

## 2016-11-09 DIAGNOSIS — E538 Deficiency of other specified B group vitamins: Secondary | ICD-10-CM | POA: Diagnosis not present

## 2016-11-09 DIAGNOSIS — D519 Vitamin B12 deficiency anemia, unspecified: Secondary | ICD-10-CM | POA: Diagnosis not present

## 2016-11-09 DIAGNOSIS — Z0001 Encounter for general adult medical examination with abnormal findings: Secondary | ICD-10-CM | POA: Diagnosis not present

## 2016-11-09 DIAGNOSIS — N390 Urinary tract infection, site not specified: Secondary | ICD-10-CM | POA: Diagnosis not present

## 2016-11-09 DIAGNOSIS — Z6824 Body mass index (BMI) 24.0-24.9, adult: Secondary | ICD-10-CM | POA: Diagnosis not present

## 2016-11-09 DIAGNOSIS — R7309 Other abnormal glucose: Secondary | ICD-10-CM | POA: Diagnosis not present

## 2016-11-19 DIAGNOSIS — N390 Urinary tract infection, site not specified: Secondary | ICD-10-CM | POA: Diagnosis not present

## 2016-11-19 DIAGNOSIS — I10 Essential (primary) hypertension: Secondary | ICD-10-CM | POA: Diagnosis not present

## 2016-11-19 DIAGNOSIS — Z9181 History of falling: Secondary | ICD-10-CM | POA: Diagnosis not present

## 2016-11-19 DIAGNOSIS — E538 Deficiency of other specified B group vitamins: Secondary | ICD-10-CM | POA: Diagnosis not present

## 2016-11-19 DIAGNOSIS — Z89432 Acquired absence of left foot: Secondary | ICD-10-CM | POA: Diagnosis not present

## 2016-12-05 DIAGNOSIS — Z1389 Encounter for screening for other disorder: Secondary | ICD-10-CM | POA: Diagnosis not present

## 2016-12-05 DIAGNOSIS — E538 Deficiency of other specified B group vitamins: Secondary | ICD-10-CM | POA: Diagnosis not present

## 2016-12-05 DIAGNOSIS — Z6825 Body mass index (BMI) 25.0-25.9, adult: Secondary | ICD-10-CM | POA: Diagnosis not present

## 2016-12-05 DIAGNOSIS — S8390XA Sprain of unspecified site of unspecified knee, initial encounter: Secondary | ICD-10-CM | POA: Diagnosis not present

## 2016-12-05 DIAGNOSIS — D638 Anemia in other chronic diseases classified elsewhere: Secondary | ICD-10-CM | POA: Diagnosis not present

## 2017-01-10 DIAGNOSIS — D510 Vitamin B12 deficiency anemia due to intrinsic factor deficiency: Secondary | ICD-10-CM | POA: Diagnosis not present

## 2017-02-21 DIAGNOSIS — H353131 Nonexudative age-related macular degeneration, bilateral, early dry stage: Secondary | ICD-10-CM | POA: Diagnosis not present

## 2017-02-21 DIAGNOSIS — H04123 Dry eye syndrome of bilateral lacrimal glands: Secondary | ICD-10-CM | POA: Diagnosis not present

## 2017-02-21 DIAGNOSIS — H401133 Primary open-angle glaucoma, bilateral, severe stage: Secondary | ICD-10-CM | POA: Diagnosis not present

## 2017-05-07 ENCOUNTER — Other Ambulatory Visit: Payer: Self-pay | Admitting: Family Medicine

## 2017-05-07 DIAGNOSIS — Z1231 Encounter for screening mammogram for malignant neoplasm of breast: Secondary | ICD-10-CM

## 2017-06-17 ENCOUNTER — Ambulatory Visit
Admission: RE | Admit: 2017-06-17 | Discharge: 2017-06-17 | Disposition: A | Discharge status: Home / Self Care | Payer: Medicare Other | Source: Ambulatory Visit | Attending: Family Medicine | Admitting: Family Medicine

## 2017-06-17 DIAGNOSIS — Z1231 Encounter for screening mammogram for malignant neoplasm of breast: Secondary | ICD-10-CM | POA: Diagnosis not present

## 2017-07-04 DIAGNOSIS — H401133 Primary open-angle glaucoma, bilateral, severe stage: Secondary | ICD-10-CM | POA: Diagnosis not present

## 2017-07-04 DIAGNOSIS — H353131 Nonexudative age-related macular degeneration, bilateral, early dry stage: Secondary | ICD-10-CM | POA: Diagnosis not present

## 2017-07-04 DIAGNOSIS — H26491 Other secondary cataract, right eye: Secondary | ICD-10-CM | POA: Diagnosis not present

## 2017-07-04 DIAGNOSIS — H04123 Dry eye syndrome of bilateral lacrimal glands: Secondary | ICD-10-CM | POA: Diagnosis not present

## 2017-08-12 DIAGNOSIS — Z6824 Body mass index (BMI) 24.0-24.9, adult: Secondary | ICD-10-CM | POA: Diagnosis not present

## 2017-08-12 DIAGNOSIS — R7309 Other abnormal glucose: Secondary | ICD-10-CM | POA: Diagnosis not present

## 2017-08-12 DIAGNOSIS — M1991 Primary osteoarthritis, unspecified site: Secondary | ICD-10-CM | POA: Diagnosis not present

## 2017-08-12 DIAGNOSIS — I052 Rheumatic mitral stenosis with insufficiency: Secondary | ICD-10-CM | POA: Diagnosis not present

## 2017-10-09 DIAGNOSIS — D649 Anemia, unspecified: Secondary | ICD-10-CM | POA: Diagnosis not present

## 2017-10-09 DIAGNOSIS — R300 Dysuria: Secondary | ICD-10-CM | POA: Diagnosis not present

## 2017-10-09 DIAGNOSIS — R809 Proteinuria, unspecified: Secondary | ICD-10-CM | POA: Diagnosis not present

## 2017-10-09 DIAGNOSIS — N390 Urinary tract infection, site not specified: Secondary | ICD-10-CM | POA: Diagnosis not present

## 2017-10-09 DIAGNOSIS — N183 Chronic kidney disease, stage 3 (moderate): Secondary | ICD-10-CM | POA: Diagnosis not present

## 2017-10-21 ENCOUNTER — Other Ambulatory Visit (HOSPITAL_COMMUNITY): Payer: Self-pay | Admitting: Nephrology

## 2017-10-21 DIAGNOSIS — N182 Chronic kidney disease, stage 2 (mild): Secondary | ICD-10-CM

## 2017-10-31 ENCOUNTER — Ambulatory Visit (HOSPITAL_COMMUNITY)
Admission: RE | Admit: 2017-10-31 | Discharge: 2017-10-31 | Disposition: A | Discharge status: Home / Self Care | Payer: Medicare Other | Source: Ambulatory Visit | Attending: Nephrology | Admitting: Nephrology

## 2017-10-31 DIAGNOSIS — E559 Vitamin D deficiency, unspecified: Secondary | ICD-10-CM | POA: Diagnosis not present

## 2017-10-31 DIAGNOSIS — N183 Chronic kidney disease, stage 3 (moderate): Secondary | ICD-10-CM | POA: Diagnosis not present

## 2017-10-31 DIAGNOSIS — N182 Chronic kidney disease, stage 2 (mild): Secondary | ICD-10-CM | POA: Diagnosis present

## 2017-10-31 DIAGNOSIS — Z79899 Other long term (current) drug therapy: Secondary | ICD-10-CM | POA: Diagnosis not present

## 2017-10-31 DIAGNOSIS — I10 Essential (primary) hypertension: Secondary | ICD-10-CM | POA: Diagnosis not present

## 2017-10-31 DIAGNOSIS — D509 Iron deficiency anemia, unspecified: Secondary | ICD-10-CM | POA: Diagnosis not present

## 2017-11-12 ENCOUNTER — Ambulatory Visit: Payer: Medicare Other | Admitting: Cardiovascular Disease

## 2017-11-21 DIAGNOSIS — D638 Anemia in other chronic diseases classified elsewhere: Secondary | ICD-10-CM | POA: Diagnosis not present

## 2017-11-21 DIAGNOSIS — N184 Chronic kidney disease, stage 4 (severe): Secondary | ICD-10-CM | POA: Diagnosis not present

## 2017-11-21 DIAGNOSIS — Z0001 Encounter for general adult medical examination with abnormal findings: Secondary | ICD-10-CM | POA: Diagnosis not present

## 2017-11-21 DIAGNOSIS — R7309 Other abnormal glucose: Secondary | ICD-10-CM | POA: Diagnosis not present

## 2017-11-21 DIAGNOSIS — Z6824 Body mass index (BMI) 24.0-24.9, adult: Secondary | ICD-10-CM | POA: Diagnosis not present

## 2017-11-28 DIAGNOSIS — H04123 Dry eye syndrome of bilateral lacrimal glands: Secondary | ICD-10-CM | POA: Diagnosis not present

## 2017-11-28 DIAGNOSIS — H401133 Primary open-angle glaucoma, bilateral, severe stage: Secondary | ICD-10-CM | POA: Diagnosis not present

## 2017-11-28 DIAGNOSIS — H353131 Nonexudative age-related macular degeneration, bilateral, early dry stage: Secondary | ICD-10-CM | POA: Diagnosis not present

## 2017-12-04 DIAGNOSIS — I509 Heart failure, unspecified: Secondary | ICD-10-CM | POA: Diagnosis not present

## 2017-12-04 DIAGNOSIS — N183 Chronic kidney disease, stage 3 (moderate): Secondary | ICD-10-CM | POA: Diagnosis not present

## 2017-12-04 DIAGNOSIS — N2581 Secondary hyperparathyroidism of renal origin: Secondary | ICD-10-CM | POA: Diagnosis not present

## 2017-12-11 ENCOUNTER — Encounter (HOSPITAL_COMMUNITY)
Admission: RE | Admit: 2017-12-11 | Discharge: 2017-12-11 | Disposition: A | Discharge status: Home / Self Care | Payer: Medicare Other | Source: Ambulatory Visit | Attending: Nephrology | Admitting: Nephrology

## 2017-12-11 DIAGNOSIS — D509 Iron deficiency anemia, unspecified: Secondary | ICD-10-CM | POA: Diagnosis not present

## 2017-12-11 MED ORDER — SODIUM CHLORIDE 0.9 % IV SOLN
510.0000 mg | INTRAVENOUS | Status: DC
  Administered 2017-12-11: 510 mg via INTRAVENOUS
  Filled 2017-12-11: qty 17

## 2017-12-11 MED ORDER — SODIUM CHLORIDE 0.9 % IV SOLN
INTRAVENOUS | Status: DC
  Administered 2017-12-11: 250 mL via INTRAVENOUS

## 2017-12-13 ENCOUNTER — Encounter: Payer: Self-pay | Admitting: Cardiovascular Disease

## 2017-12-13 ENCOUNTER — Ambulatory Visit: Payer: Medicare Other | Admitting: Cardiovascular Disease

## 2017-12-13 VITALS — BP 138/62 | HR 60 | Ht 68.0 in | Wt 161.2 lb

## 2017-12-13 DIAGNOSIS — I10 Essential (primary) hypertension: Secondary | ICD-10-CM

## 2017-12-13 DIAGNOSIS — E7800 Pure hypercholesterolemia, unspecified: Secondary | ICD-10-CM

## 2017-12-13 NOTE — Assessment & Plan Note (Signed)
History of DVT and pulmonary embolism in past currently on Eliquis oral anticoagulation.

## 2017-12-13 NOTE — Assessment & Plan Note (Signed)
History of hyperlipidemia on statin therapy followed by her PCP. 

## 2017-12-13 NOTE — Patient Instructions (Signed)

## 2017-12-13 NOTE — Assessment & Plan Note (Signed)
History of essential hypertension blood pressure measured 138/62. She is on atenolol. Continue current meds at current dosing.

## 2017-12-13 NOTE — Progress Notes (Signed)
12/13/2017 Cassandra [name]   1933-04-07  [id number]  Primary Physician [name], MD Primary Cardiologist: Runell Gess MD FACP, [place], [place], [place]  HPI:  Cassandra [name] is a 84 y.o.  mildly overweight, married Caucasian female, mother of 3, grandmother to 4 grandchildren who I last saw in the office 11/07/16. She is accompanied by her granddaughter Cassandra [name]. Her risk factors for heart disease are positive for hyperlipidemia as well as hypertension. She was cath'd November 2003 revealing normal coronary arteries and normal left ventricular function. She does have a history of a small PFO which she is asymptomatic from. Her last echo performed in 2008 was normal. She is otherwise asymptomatic. Dr. Phillips Odor apparently follows her lab work including lipid profile in his office. She continues to have issues with a slowly healing ulcer on the bottom of her left foot which is followed by her podiatrist, Dr. Nolen Mu with "offloading". Since I saw her last she has had an uneventful year. She's had osteomyelitis and transmetatarsal amputation by Dr. Victorino Dike. She had subacute bacterial endocarditis on long-term IV antibiotic therapy via PICC line as well as pulmonary embolus currently on oral anticoagulation. Since I saw her a year ago she's had symptomatic iron deficiency anemia requiring transfusion and is scheduled for GI evaluation in the near future.    Current Meds  Medication Sig  . apixaban (ELIQUIS) 5 MG TABS tablet Take 2 tablets (10 mg total) by mouth 2 (two) times daily.  [name] atenolol (TENORMIN) 100 MG tablet Take 1 tablet by mouth daily.  . Biotin 5000 MCG CAPS Take 10,000 mcg by mouth daily.   . Calcium Carbonate-Vitamin D (CALCIUM + D) 600-200 MG-UNIT TABS Take 1 tablet by mouth daily.  . carboxymethylcellulose (REFRESH PLUS) 0.5 % SOLN Place 1 drop into both eyes 3 (three) times daily as needed (Dry Eyes).  . Cyanocobalamin (VITAMIN B-12 IJ) Inject as directed every 30 (thirty)  days.  . feeding supplement, ENSURE ENLIVE, (ENSURE ENLIVE) LIQD Take 237 mLs by mouth 2 (two) times daily between meals.  . furosemide (LASIX) 40 MG tablet Take 1 tablet (40 mg total) by mouth daily.  . isosorbide mononitrate (IMDUR) 30 MG 24 hr tablet Take 1 tablet (30 mg total) by mouth daily.  [name] latanoprost (XALATAN) 0.005 % ophthalmic solution Place 1 drop into both eyes at bedtime.  . Multiple Vitamins-Minerals (PRESERVISION AREDS PO) Take 1 tablet by mouth 2 (two) times daily.  [name] nystatin (MYCOSTATIN/NYSTOP) 100000 UNIT/GM POWD Apply 1 Bottle topically 2 (two) times daily.  . ondansetron (ZOFRAN ODT) 4 MG disintegrating tablet 4mg  ODT q4 hours prn nausea/vomit  . pantoprazole (PROTONIX) 40 MG tablet Take 1 tablet by mouth daily.  . ranitidine (ZANTAC) 300 MG tablet Take 1 tablet (300 mg total) by mouth at bedtime.  . sertraline (ZOLOFT) 100 MG tablet Take 100 mg by mouth daily.   [name] SIMBRINZA 1-0.2 % SUSP Place 1 drop into both eyes 3 (three) times daily.  . simvastatin (ZOCOR) 10 MG tablet Take 10 mg by mouth at bedtime.  . traMADol (ULTRAM) 50 MG tablet Take 1 tablet (50 mg total) by mouth every 12 (twelve) hours as needed for moderate pain.     Allergies  Allergen Reactions  . Codeine Shortness Of Breath and Nausea And Vomiting  . Hydrocodone Shortness Of Breath and Nausea And Vomiting    Social History   Socioeconomic History  . Marital status: Married    Spouse name: Not on file  .  Number of children: Not on file  . Years of education: Not on file  . Highest education level: Not on file  Social Needs  . Financial resource strain: Not on file  . Food insecurity - worry: Not on file  . Food insecurity - inability: Not on file  . Transportation needs - medical: Not on file  . Transportation needs - non-medical: Not on file  Occupational History  . Not on file  Tobacco Use  . Smoking status: Never Smoker  . Smokeless tobacco: Never Used  Substance and Sexual Activity    . Alcohol use: No  . Drug use: No  . Sexual activity: Not on file  Other Topics Concern  . Not on file  Social History Narrative  . Not on file     Review of Systems: General: negative for chills, fever, night sweats or weight changes.  Cardiovascular: negative for chest pain, dyspnea on exertion, edema, orthopnea, palpitations, paroxysmal nocturnal dyspnea or shortness of breath Dermatological: negative for rash Respiratory: negative for cough or wheezing Urologic: negative for hematuria Abdominal: negative for nausea, vomiting, diarrhea, bright red blood per rectum, melena, or hematemesis Neurologic: negative for visual changes, syncope, or dizziness All other systems reviewed and are otherwise negative except as noted above.    Blood pressure 138/62, pulse 60, height 5\' 8"  (1.727 m), weight 161 lb 3.2 oz (73.1 kg).  General appearance: alert and no distress Neck: no adenopathy, no carotid bruit, no JVD, supple, symmetrical, trachea midline and thyroid not enlarged, symmetric, no tenderness/mass/nodules Lungs: clear to auscultation bilaterally Heart: regular rate and rhythm, S1, S2 normal, no murmur, click, rub or gallop Extremities: extremities normal, atraumatic, no cyanosis or edema Pulses: 2+ and symmetric Skin: Skin color, texture, turgor normal. No rashes or lesions Neurologic: Alert and oriented X 3, normal strength and tone. Normal symmetric reflexes. Normal coordination and gait  EKG sinus rhythm at 60 with right bundle branch block. I personally reviewed this EKG.  ASSESSMENT AND PLAN:   Hypertension History of essential hypertension blood pressure measured 138/62. She is on atenolol. Continue current meds at current dosing.  Hyperlipidemia History of hyperlipidemia on statin therapy followed by her PCP  Pulmonary embolism (HCC) History of DVT and pulmonary embolism in past currently on Eliquis oral anticoagulation.      Runell GessJonathan J. Yeng Frankie MD  FACP,FACC,FAHA, [facility] 12/13/2017 4:40 PM

## 2017-12-18 ENCOUNTER — Encounter (HOSPITAL_COMMUNITY)
Admission: RE | Admit: 2017-12-18 | Discharge: 2017-12-18 | Disposition: A | Discharge status: Home / Self Care | Payer: Medicare Other | Source: Ambulatory Visit | Attending: Nephrology | Admitting: Nephrology

## 2017-12-18 DIAGNOSIS — D509 Iron deficiency anemia, unspecified: Secondary | ICD-10-CM | POA: Diagnosis not present

## 2017-12-18 MED ORDER — SODIUM CHLORIDE 0.9 % IV SOLN
510.0000 mg | Freq: Once | INTRAVENOUS | Status: AC
  Administered 2017-12-18: 510 mg via INTRAVENOUS
  Filled 2017-12-18: qty 17

## 2017-12-18 MED ORDER — SODIUM CHLORIDE 0.9 % IV SOLN
INTRAVENOUS | Status: DC
  Administered 2017-12-18: 12:00:00 via INTRAVENOUS

## 2018-01-08 ENCOUNTER — Encounter (INDEPENDENT_AMBULATORY_CARE_PROVIDER_SITE_OTHER): Payer: Self-pay | Admitting: Internal Medicine

## 2018-01-08 ENCOUNTER — Ambulatory Visit (INDEPENDENT_AMBULATORY_CARE_PROVIDER_SITE_OTHER): Payer: Medicare Other | Admitting: Internal Medicine

## 2018-01-08 VITALS — BP 132/68 | HR 64 | Temp 97.8°F | Ht 68.0 in | Wt 157.4 lb

## 2018-01-08 DIAGNOSIS — D508 Other iron deficiency anemias: Secondary | ICD-10-CM | POA: Diagnosis not present

## 2018-01-08 NOTE — Progress Notes (Addendum)
Subjective:    Patient ID: Cassandra [name], female    DOB: [birth year], 84 y.o.   MRN: [id number] Patient did not bring her medications and I could not verify HPI Referred by Dr. Fausto Skillern for anemia.Hx of same. Has received 2 iron transfusions in January at [facility].  States her stools have been dark  (black).  No change in her stools. BMs daily usually. Appetite is good. No weight loss. No family hx of colon cancer.     Hx of CKD stage 3 and anemia.  Hx of PE and maintained on Eliquis.    10/31/2017 TIBC 391, UIBC 358, Iron 33, Iron saturation 8, Vitamin B12 963, Folate 16.9, Ferritin 13, Hemoglobin 10.5, Hemocrit 33.7. HBsAg negative.     04/05/2011 EGD with ED and colonoscopy. GERD, Screening colonoscopy Dr. Laural Benes: Normal Colonoscopy.  EGD:  ASSESSMENT: 1. Benign peptic stricture at the esophagogastric junction, dilated to     16.5 mm with the CRE balloon dilator. 2. Several small superficial mucosal erosions are noted in the     prepyloric gastric antrum probably related to the use of aspirin     and naproxen.  Review of Systems     Past Medical History:  Diagnosis Date  . Arthritis   . Cellulitis and abscess of toe of left foot 09/16/2014  . Depression   . H/O cardiovascular stress test 10/13/2007   low risk scan, no changes from previous test  . H/O echocardiogram 2008   prolapse of post. mitral leaflet, tr MR, tr TR, EF >55%, mild concentric LVH  . Heart palpitations    controlled on BB  . Hiatal hernia   . Hyperlipidemia   . Hypertension   . PFO (patent foramen ovale)    small    Past Surgical History:  Procedure Laterality Date  . AMPUTATION Left 09/20/2014   Procedure: AMPUTATION DIGIT (LEFT GREAT TOE);  Surgeon: Dallas Schimke, DPM;  Location: [facility];  Service: Podiatry;  Laterality: Left;  . AMPUTATION Left 01/08/2016   Procedure: AMPUTATION TRANSMETATARSAL LEFT FOOT and achilles tendon lengthening;  Surgeon: Toni Arthurs, MD;  Location: [facility] OR;   Service: Orthopedics;  Laterality: Left;  . BONE BIOPSY Left 09/17/2014   Procedure: BONE BIOPSY;  Surgeon: Dallas Schimke, DPM;  Location: [facility];  Service: Podiatry;  Laterality: Left;  . CARDIAC CATHETERIZATION  10/2002   normal coronary arteries, normal LV function  . CHOLECYSTECTOMY    . INCISION AND DRAINAGE Right 07/21/2014   Procedure: INCISION AND DRAINAGE;  Surgeon: Dallas Schimke, DPM;  Location: [facility];  Service: Podiatry;  Laterality: Right;  . INCISION AND DRAINAGE Left 09/17/2014   Procedure: INCISION AND DRAINAGE 1ST TOE LEFT FOOT;  Surgeon: Dallas Schimke, DPM;  Location: [facility];  Service: Podiatry;  Laterality: Left;  . TEE WITHOUT CARDIOVERSION N/A 01/05/2016   Procedure: TRANSESOPHAGEAL ECHOCARDIOGRAM (TEE);  Surgeon: Thurmon Fair, MD;  Location: [facility] END[facility]COPY;  Service: Cardiovascular;  Laterality: N/A;    Allergies  Allergen Reactions  . Codeine Shortness Of Breath and Nausea And Vomiting  . Hydrocodone Shortness Of Breath and Nausea And Vomiting    Current Outpatient Medications on File Prior to Visit  Medication Sig Dispense Refill  . apixaban (ELIQUIS) 5 MG TABS tablet Take 2 tablets (10 mg total) by mouth 2 (two) times daily. 8 tablet 0  . atenolol (TENORMIN) 100 MG tablet Take 1 tablet by mouth daily.    . Biotin 5000 MCG CAPS Take 10,000 mcg by  mouth daily.     . Calcium Carbonate-Vitamin D (CALCIUM + D) 600-200 MG-UNIT TABS Take 1 tablet by mouth daily.    . carboxymethylcellulose (REFRESH PLUS) 0.5 % SOLN Place 1 drop into both eyes 3 (three) times daily as needed (Dry Eyes).    . Cyanocobalamin (VITAMIN B-12 IJ) Inject as directed every 30 (thirty) days.    . feeding supplement, ENSURE ENLIVE, (ENSURE ENLIVE) LIQD Take 237 mLs by mouth 2 (two) times daily between meals. 237 mL 12  . furosemide (LASIX) 40 MG tablet Take 1 tablet (40 mg total) by mouth daily. 30 tablet 0  . isosorbide mononitrate (IMDUR) 30 MG 24 hr tablet Take 1 tablet  (30 mg total) by mouth daily.    [name]. latanoprost (XALATAN) 0.005 % ophthalmic solution Place 1 drop into both eyes at bedtime.    . Multiple Vitamins-Minerals (PRESERVISION AREDS PO) Take 1 tablet by mouth 2 (two) times daily.    [name]. nystatin (MYCOSTATIN/NYSTOP) 100000 UNIT/GM POWD Apply 1 Bottle topically 2 (two) times daily.    . ondansetron (ZOFRAN ODT) 4 MG disintegrating tablet 4mg  ODT q4 hours prn nausea/vomit 4 tablet 0  . pantoprazole (PROTONIX) 40 MG tablet Take 1 tablet by mouth daily.  1  . ranitidine (ZANTAC) 300 MG tablet Take 1 tablet (300 mg total) by mouth at bedtime.    . sertraline (ZOLOFT) 100 MG tablet Take 100 mg by mouth daily.     [name]. SIMBRINZA 1-0.2 % SUSP Place 1 drop into both eyes 3 (three) times daily.    . simvastatin (ZOCOR) 10 MG tablet Take 10 mg by mouth at bedtime.    . traMADol (ULTRAM) 50 MG tablet Take 1 tablet (50 mg total) by mouth every 12 (twelve) hours as needed for moderate pain. 30 tablet 0   No current facility-administered medications on file prior to visit.          Objective:   Physical Exam Blood pressure 132/68, pulse 64, temperature 97.8 F (36.6 C), height 5\' 8"  (1.727 m), weight 157 lb 6.4 oz (71.4 kg). Alert and oriented. Skin warm and dry. Oral mucosa is moist.   . Sclera anicteric, conjunctivae is pink. Thyroid not enlarged. No cervical lymphadenopathy. Lungs clear. Heart regular rate and rhythm.  Abdomen is soft. Bowel sounds are positive. No hepatomegaly. No abdominal masses felt. No tenderness.  No edema to lower extremities.  Stool brown and guaiac negative        Assessment & Plan:  IDA. Am going to repeat Iron studies, CBC. 2 stool cards home with patient.

## 2018-01-08 NOTE — Patient Instructions (Addendum)
CBC, Iron studies. 2 stool cards home with patient.  Need a list of her medications.

## 2018-01-09 LAB — CBC WITH DIFFERENTIAL/PLATELET
BASOS ABS: 37 {cells}/uL (ref 0–200)
Basophils Relative: 0.6 %
Eosinophils Absolute: 180 cells/uL (ref 15–500)
Eosinophils Relative: 2.9 %
HEMATOCRIT: 35.3 % (ref 35.0–45.0)
HEMOGLOBIN: 11.7 g/dL (ref 11.7–15.5)
LYMPHS ABS: 1463 {cells}/uL (ref 850–3900)
MCH: 29.1 pg (ref 27.0–33.0)
MCHC: 33.1 g/dL (ref 32.0–36.0)
MCV: 87.8 fL (ref 80.0–100.0)
MPV: 11.2 fL (ref 7.5–12.5)
Monocytes Relative: 9.3 %
NEUTROS ABS: 3943 {cells}/uL (ref 1500–7800)
NEUTROS PCT: 63.6 %
Platelets: 192 10*3/uL (ref 140–400)
RBC: 4.02 10*6/uL (ref 3.80–5.10)
RDW: 17.2 % — AB (ref 11.0–15.0)
Total Lymphocyte: 23.6 %
WBC: 6.2 10*3/uL (ref 3.8–10.8)
WBCMIX: 577 {cells}/uL (ref 200–950)

## 2018-01-09 LAB — FERRITIN: Ferritin: 263 ng/mL (ref 20–288)

## 2018-01-09 LAB — IRON, TOTAL/TOTAL IRON BINDING CAP
%SAT: 34 % (calc) (ref 11–50)
Iron: 83 ug/dL (ref 45–160)
TIBC: 246 ug/dL — AB (ref 250–450)

## 2018-01-14 ENCOUNTER — Telehealth (INDEPENDENT_AMBULATORY_CARE_PROVIDER_SITE_OTHER): Payer: Self-pay | Admitting: *Deleted

## 2018-01-14 NOTE — Telephone Encounter (Signed)
   Diagnosis:    Result(s)   Card 1: Negative: 01/12/18    Card 2: :Negative:01/13/18      Completed by: Larose Hiresammy Raymont Andreoni , LPN   HEMOCCULT SENSA DEVELOPER: LOT#: [id number]  S EXPIRATION DATE: 2021-11   HEMOCCULT SENSA CARD:LOT#:[id number] EXPIRATION DATE: 05/21   CARD CONTROL RESULTS:POSITIVE: Positive NEGATIVE: Negative    ADDITIONAL COMMENTS: Forwarded to the Ordering Provider , Dorene Arerri Setzer NP-C

## 2018-01-17 NOTE — Telephone Encounter (Signed)
Patient is scheduled for 04/16/18 at 10:15 patient is aware.

## 2018-01-17 NOTE — Telephone Encounter (Signed)
Results given to patient. Stool cards are negative.  Iron studies look good.   Chelsey, OV in 3 months.

## 2018-01-29 DIAGNOSIS — D509 Iron deficiency anemia, unspecified: Secondary | ICD-10-CM | POA: Diagnosis not present

## 2018-01-29 DIAGNOSIS — R809 Proteinuria, unspecified: Secondary | ICD-10-CM | POA: Diagnosis not present

## 2018-01-29 DIAGNOSIS — I10 Essential (primary) hypertension: Secondary | ICD-10-CM | POA: Diagnosis not present

## 2018-01-29 DIAGNOSIS — E559 Vitamin D deficiency, unspecified: Secondary | ICD-10-CM | POA: Diagnosis not present

## 2018-02-05 DIAGNOSIS — I509 Heart failure, unspecified: Secondary | ICD-10-CM | POA: Diagnosis not present

## 2018-02-05 DIAGNOSIS — N183 Chronic kidney disease, stage 3 (moderate): Secondary | ICD-10-CM | POA: Diagnosis not present

## 2018-04-15 DIAGNOSIS — M1991 Primary osteoarthritis, unspecified site: Secondary | ICD-10-CM | POA: Diagnosis not present

## 2018-04-15 DIAGNOSIS — Z6824 Body mass index (BMI) 24.0-24.9, adult: Secondary | ICD-10-CM | POA: Diagnosis not present

## 2018-04-15 DIAGNOSIS — M25561 Pain in right knee: Secondary | ICD-10-CM | POA: Diagnosis not present

## 2018-04-15 DIAGNOSIS — M7121 Synovial cyst of popliteal space [Baker], right knee: Secondary | ICD-10-CM | POA: Diagnosis not present

## 2018-04-16 ENCOUNTER — Ambulatory Visit (INDEPENDENT_AMBULATORY_CARE_PROVIDER_SITE_OTHER): Payer: Medicare Other | Admitting: Internal Medicine

## 2018-04-17 ENCOUNTER — Other Ambulatory Visit (HOSPITAL_COMMUNITY): Payer: Self-pay | Admitting: Family Medicine

## 2018-04-17 DIAGNOSIS — E2839 Other primary ovarian failure: Secondary | ICD-10-CM

## 2018-04-21 ENCOUNTER — Ambulatory Visit (HOSPITAL_COMMUNITY)
Admission: RE | Admit: 2018-04-21 | Discharge: 2018-04-21 | Disposition: A | Discharge status: Home / Self Care | Payer: Medicare Other | Source: Ambulatory Visit | Attending: Family Medicine | Admitting: Family Medicine

## 2018-04-21 DIAGNOSIS — M85852 Other specified disorders of bone density and structure, left thigh: Secondary | ICD-10-CM | POA: Diagnosis not present

## 2018-04-21 DIAGNOSIS — M85851 Other specified disorders of bone density and structure, right thigh: Secondary | ICD-10-CM | POA: Diagnosis not present

## 2018-04-21 DIAGNOSIS — Z780 Asymptomatic menopausal state: Secondary | ICD-10-CM | POA: Diagnosis not present

## 2018-04-21 DIAGNOSIS — E2839 Other primary ovarian failure: Secondary | ICD-10-CM | POA: Diagnosis present

## 2018-04-29 DIAGNOSIS — M7121 Synovial cyst of popliteal space [Baker], right knee: Secondary | ICD-10-CM | POA: Diagnosis not present

## 2018-05-08 ENCOUNTER — Other Ambulatory Visit: Payer: Self-pay | Admitting: Family Medicine

## 2018-05-08 DIAGNOSIS — Z1231 Encounter for screening mammogram for malignant neoplasm of breast: Secondary | ICD-10-CM

## 2018-05-28 DIAGNOSIS — H524 Presbyopia: Secondary | ICD-10-CM | POA: Diagnosis not present

## 2018-05-28 DIAGNOSIS — H401133 Primary open-angle glaucoma, bilateral, severe stage: Secondary | ICD-10-CM | POA: Diagnosis not present

## 2018-05-28 DIAGNOSIS — H04123 Dry eye syndrome of bilateral lacrimal glands: Secondary | ICD-10-CM | POA: Diagnosis not present

## 2018-05-28 DIAGNOSIS — H353131 Nonexudative age-related macular degeneration, bilateral, early dry stage: Secondary | ICD-10-CM | POA: Diagnosis not present

## 2018-06-16 DIAGNOSIS — I10 Essential (primary) hypertension: Secondary | ICD-10-CM | POA: Diagnosis not present

## 2018-06-16 DIAGNOSIS — R809 Proteinuria, unspecified: Secondary | ICD-10-CM | POA: Diagnosis not present

## 2018-06-16 DIAGNOSIS — Z79899 Other long term (current) drug therapy: Secondary | ICD-10-CM | POA: Diagnosis not present

## 2018-06-16 DIAGNOSIS — N183 Chronic kidney disease, stage 3 (moderate): Secondary | ICD-10-CM | POA: Diagnosis not present

## 2018-06-18 DIAGNOSIS — D508 Other iron deficiency anemias: Secondary | ICD-10-CM | POA: Diagnosis not present

## 2018-06-18 DIAGNOSIS — N179 Acute kidney failure, unspecified: Secondary | ICD-10-CM | POA: Diagnosis not present

## 2018-06-19 ENCOUNTER — Ambulatory Visit
Admission: RE | Admit: 2018-06-19 | Discharge: 2018-06-19 | Disposition: A | Discharge status: Home / Self Care | Payer: Medicare Other | Source: Ambulatory Visit | Attending: Family Medicine | Admitting: Family Medicine

## 2018-06-19 DIAGNOSIS — Z1231 Encounter for screening mammogram for malignant neoplasm of breast: Secondary | ICD-10-CM

## 2018-08-27 ENCOUNTER — Other Ambulatory Visit (HOSPITAL_COMMUNITY): Payer: Self-pay | Admitting: Physician Assistant

## 2018-08-27 DIAGNOSIS — Z6824 Body mass index (BMI) 24.0-24.9, adult: Secondary | ICD-10-CM | POA: Diagnosis not present

## 2018-08-27 DIAGNOSIS — I872 Venous insufficiency (chronic) (peripheral): Secondary | ICD-10-CM | POA: Diagnosis not present

## 2018-08-27 DIAGNOSIS — M7989 Other specified soft tissue disorders: Secondary | ICD-10-CM | POA: Diagnosis not present

## 2018-08-27 DIAGNOSIS — Z23 Encounter for immunization: Secondary | ICD-10-CM | POA: Diagnosis not present

## 2018-09-08 ENCOUNTER — Ambulatory Visit (HOSPITAL_COMMUNITY): Payer: Medicare Other

## 2018-09-10 ENCOUNTER — Ambulatory Visit (HOSPITAL_COMMUNITY): Admission: RE | Admit: 2018-09-10 | Payer: Medicare Other | Source: Ambulatory Visit

## 2018-09-16 ENCOUNTER — Other Ambulatory Visit (HOSPITAL_COMMUNITY): Payer: Self-pay | Admitting: Physician Assistant

## 2018-09-16 ENCOUNTER — Ambulatory Visit (HOSPITAL_COMMUNITY)
Admission: RE | Admit: 2018-09-16 | Discharge: 2018-09-16 | Disposition: A | Discharge status: Home / Self Care | Payer: Medicare Other | Source: Ambulatory Visit | Attending: Physician Assistant | Admitting: Physician Assistant

## 2018-09-16 DIAGNOSIS — M7989 Other specified soft tissue disorders: Secondary | ICD-10-CM

## 2018-09-16 DIAGNOSIS — I872 Venous insufficiency (chronic) (peripheral): Secondary | ICD-10-CM | POA: Diagnosis not present

## 2018-09-16 DIAGNOSIS — I70223 Atherosclerosis of native arteries of extremities with rest pain, bilateral legs: Secondary | ICD-10-CM | POA: Diagnosis not present

## 2018-10-02 DIAGNOSIS — H04123 Dry eye syndrome of bilateral lacrimal glands: Secondary | ICD-10-CM | POA: Diagnosis not present

## 2018-10-02 DIAGNOSIS — H401133 Primary open-angle glaucoma, bilateral, severe stage: Secondary | ICD-10-CM | POA: Diagnosis not present

## 2018-10-02 DIAGNOSIS — H353131 Nonexudative age-related macular degeneration, bilateral, early dry stage: Secondary | ICD-10-CM | POA: Diagnosis not present

## 2018-10-15 DIAGNOSIS — N183 Chronic kidney disease, stage 3 (moderate): Secondary | ICD-10-CM | POA: Diagnosis not present

## 2018-10-15 DIAGNOSIS — Z79899 Other long term (current) drug therapy: Secondary | ICD-10-CM | POA: Diagnosis not present

## 2018-10-15 DIAGNOSIS — R809 Proteinuria, unspecified: Secondary | ICD-10-CM | POA: Diagnosis not present

## 2018-10-15 DIAGNOSIS — I10 Essential (primary) hypertension: Secondary | ICD-10-CM | POA: Diagnosis not present

## 2018-10-22 DIAGNOSIS — N183 Chronic kidney disease, stage 3 (moderate): Secondary | ICD-10-CM | POA: Diagnosis not present

## 2018-10-22 DIAGNOSIS — D509 Iron deficiency anemia, unspecified: Secondary | ICD-10-CM | POA: Diagnosis not present

## 2018-10-22 DIAGNOSIS — R809 Proteinuria, unspecified: Secondary | ICD-10-CM | POA: Diagnosis not present

## 2018-10-22 DIAGNOSIS — I10 Essential (primary) hypertension: Secondary | ICD-10-CM | POA: Diagnosis not present

## 2018-10-27 DIAGNOSIS — Z713 Dietary counseling and surveillance: Secondary | ICD-10-CM | POA: Diagnosis not present

## 2018-10-27 DIAGNOSIS — E782 Mixed hyperlipidemia: Secondary | ICD-10-CM | POA: Diagnosis not present

## 2018-10-27 DIAGNOSIS — Z6824 Body mass index (BMI) 24.0-24.9, adult: Secondary | ICD-10-CM | POA: Diagnosis not present

## 2018-10-27 DIAGNOSIS — I10 Essential (primary) hypertension: Secondary | ICD-10-CM | POA: Diagnosis not present

## 2018-12-16 DIAGNOSIS — M170 Bilateral primary osteoarthritis of knee: Secondary | ICD-10-CM | POA: Diagnosis not present

## 2018-12-29 DIAGNOSIS — M170 Bilateral primary osteoarthritis of knee: Secondary | ICD-10-CM | POA: Diagnosis not present

## 2019-01-05 DIAGNOSIS — M170 Bilateral primary osteoarthritis of knee: Secondary | ICD-10-CM | POA: Diagnosis not present

## 2019-01-12 DIAGNOSIS — I10 Essential (primary) hypertension: Secondary | ICD-10-CM | POA: Diagnosis not present

## 2019-01-12 DIAGNOSIS — Z79899 Other long term (current) drug therapy: Secondary | ICD-10-CM | POA: Diagnosis not present

## 2019-01-12 DIAGNOSIS — R809 Proteinuria, unspecified: Secondary | ICD-10-CM | POA: Diagnosis not present

## 2019-01-12 DIAGNOSIS — D509 Iron deficiency anemia, unspecified: Secondary | ICD-10-CM | POA: Diagnosis not present

## 2019-01-12 DIAGNOSIS — M170 Bilateral primary osteoarthritis of knee: Secondary | ICD-10-CM | POA: Diagnosis not present

## 2019-01-15 DIAGNOSIS — Z0001 Encounter for general adult medical examination with abnormal findings: Secondary | ICD-10-CM | POA: Diagnosis not present

## 2019-01-15 DIAGNOSIS — Z6824 Body mass index (BMI) 24.0-24.9, adult: Secondary | ICD-10-CM | POA: Diagnosis not present

## 2019-01-15 DIAGNOSIS — Z1389 Encounter for screening for other disorder: Secondary | ICD-10-CM | POA: Diagnosis not present

## 2019-01-21 DIAGNOSIS — D509 Iron deficiency anemia, unspecified: Secondary | ICD-10-CM | POA: Diagnosis not present

## 2019-01-21 DIAGNOSIS — N183 Chronic kidney disease, stage 3 (moderate): Secondary | ICD-10-CM | POA: Diagnosis not present

## 2019-01-21 DIAGNOSIS — I10 Essential (primary) hypertension: Secondary | ICD-10-CM | POA: Diagnosis not present

## 2019-05-12 ENCOUNTER — Other Ambulatory Visit: Payer: Self-pay | Admitting: Family Medicine

## 2019-05-12 DIAGNOSIS — Z1231 Encounter for screening mammogram for malignant neoplasm of breast: Secondary | ICD-10-CM

## 2019-05-28 DIAGNOSIS — H524 Presbyopia: Secondary | ICD-10-CM | POA: Diagnosis not present

## 2019-05-28 DIAGNOSIS — H04123 Dry eye syndrome of bilateral lacrimal glands: Secondary | ICD-10-CM | POA: Diagnosis not present

## 2019-05-28 DIAGNOSIS — H353131 Nonexudative age-related macular degeneration, bilateral, early dry stage: Secondary | ICD-10-CM | POA: Diagnosis not present

## 2019-05-28 DIAGNOSIS — H401133 Primary open-angle glaucoma, bilateral, severe stage: Secondary | ICD-10-CM | POA: Diagnosis not present

## 2019-06-30 ENCOUNTER — Other Ambulatory Visit: Payer: Self-pay

## 2019-06-30 ENCOUNTER — Ambulatory Visit
Admission: RE | Admit: 2019-06-30 | Discharge: 2019-06-30 | Disposition: A | Discharge status: Home / Self Care | Payer: Medicare Other | Source: Ambulatory Visit | Attending: Family Medicine | Admitting: Family Medicine

## 2019-06-30 DIAGNOSIS — Z1231 Encounter for screening mammogram for malignant neoplasm of breast: Secondary | ICD-10-CM | POA: Diagnosis not present

## 2019-09-10 DIAGNOSIS — Z23 Encounter for immunization: Secondary | ICD-10-CM | POA: Diagnosis not present

## 2019-10-01 DIAGNOSIS — H353131 Nonexudative age-related macular degeneration, bilateral, early dry stage: Secondary | ICD-10-CM | POA: Diagnosis not present

## 2019-10-01 DIAGNOSIS — H401133 Primary open-angle glaucoma, bilateral, severe stage: Secondary | ICD-10-CM | POA: Diagnosis not present

## 2019-10-01 DIAGNOSIS — H04123 Dry eye syndrome of bilateral lacrimal glands: Secondary | ICD-10-CM | POA: Diagnosis not present

## 2019-12-09 DIAGNOSIS — I052 Rheumatic mitral stenosis with insufficiency: Secondary | ICD-10-CM | POA: Diagnosis not present

## 2019-12-09 DIAGNOSIS — R600 Localized edema: Secondary | ICD-10-CM | POA: Diagnosis not present

## 2019-12-09 DIAGNOSIS — Z6826 Body mass index (BMI) 26.0-26.9, adult: Secondary | ICD-10-CM | POA: Diagnosis not present

## 2019-12-09 DIAGNOSIS — F329 Major depressive disorder, single episode, unspecified: Secondary | ICD-10-CM | POA: Diagnosis not present

## 2020-01-13 DIAGNOSIS — D631 Anemia in chronic kidney disease: Secondary | ICD-10-CM | POA: Diagnosis not present

## 2020-01-13 DIAGNOSIS — N1831 Chronic kidney disease, stage 3a: Secondary | ICD-10-CM | POA: Diagnosis not present

## 2020-01-13 DIAGNOSIS — R809 Proteinuria, unspecified: Secondary | ICD-10-CM | POA: Diagnosis not present

## 2020-01-13 DIAGNOSIS — E559 Vitamin D deficiency, unspecified: Secondary | ICD-10-CM | POA: Diagnosis not present

## 2020-01-14 DIAGNOSIS — H401133 Primary open-angle glaucoma, bilateral, severe stage: Secondary | ICD-10-CM | POA: Diagnosis not present

## 2020-01-14 DIAGNOSIS — H353131 Nonexudative age-related macular degeneration, bilateral, early dry stage: Secondary | ICD-10-CM | POA: Diagnosis not present

## 2020-01-14 DIAGNOSIS — H04123 Dry eye syndrome of bilateral lacrimal glands: Secondary | ICD-10-CM | POA: Diagnosis not present

## 2020-01-22 DIAGNOSIS — N1831 Chronic kidney disease, stage 3a: Secondary | ICD-10-CM | POA: Diagnosis not present

## 2020-01-22 DIAGNOSIS — E870 Hyperosmolality and hypernatremia: Secondary | ICD-10-CM | POA: Diagnosis not present

## 2020-01-22 DIAGNOSIS — R809 Proteinuria, unspecified: Secondary | ICD-10-CM | POA: Diagnosis not present

## 2020-02-02 DIAGNOSIS — R7309 Other abnormal glucose: Secondary | ICD-10-CM | POA: Diagnosis not present

## 2020-02-02 DIAGNOSIS — I10 Essential (primary) hypertension: Secondary | ICD-10-CM | POA: Diagnosis not present

## 2020-02-02 DIAGNOSIS — D519 Vitamin B12 deficiency anemia, unspecified: Secondary | ICD-10-CM | POA: Diagnosis not present

## 2020-02-02 DIAGNOSIS — Z6825 Body mass index (BMI) 25.0-25.9, adult: Secondary | ICD-10-CM | POA: Diagnosis not present

## 2020-02-02 DIAGNOSIS — Z0001 Encounter for general adult medical examination with abnormal findings: Secondary | ICD-10-CM | POA: Diagnosis not present

## 2020-02-02 DIAGNOSIS — Z1389 Encounter for screening for other disorder: Secondary | ICD-10-CM | POA: Diagnosis not present

## 2020-02-11 DIAGNOSIS — H04123 Dry eye syndrome of bilateral lacrimal glands: Secondary | ICD-10-CM | POA: Diagnosis not present

## 2020-02-11 DIAGNOSIS — H401133 Primary open-angle glaucoma, bilateral, severe stage: Secondary | ICD-10-CM | POA: Diagnosis not present

## 2020-02-11 DIAGNOSIS — H353131 Nonexudative age-related macular degeneration, bilateral, early dry stage: Secondary | ICD-10-CM | POA: Diagnosis not present

## 2020-04-01 DIAGNOSIS — F329 Major depressive disorder, single episode, unspecified: Secondary | ICD-10-CM | POA: Diagnosis not present

## 2020-04-01 DIAGNOSIS — I10 Essential (primary) hypertension: Secondary | ICD-10-CM | POA: Diagnosis not present

## 2020-04-01 DIAGNOSIS — M1991 Primary osteoarthritis, unspecified site: Secondary | ICD-10-CM | POA: Diagnosis not present

## 2020-04-01 DIAGNOSIS — E7849 Other hyperlipidemia: Secondary | ICD-10-CM | POA: Diagnosis not present

## 2020-04-14 DIAGNOSIS — E870 Hyperosmolality and hypernatremia: Secondary | ICD-10-CM | POA: Diagnosis not present

## 2020-04-14 DIAGNOSIS — N1831 Chronic kidney disease, stage 3a: Secondary | ICD-10-CM | POA: Diagnosis not present

## 2020-04-14 DIAGNOSIS — R809 Proteinuria, unspecified: Secondary | ICD-10-CM | POA: Diagnosis not present

## 2020-04-26 DIAGNOSIS — N1832 Chronic kidney disease, stage 3b: Secondary | ICD-10-CM | POA: Diagnosis not present

## 2020-04-26 DIAGNOSIS — R809 Proteinuria, unspecified: Secondary | ICD-10-CM | POA: Diagnosis not present

## 2020-04-26 DIAGNOSIS — E870 Hyperosmolality and hypernatremia: Secondary | ICD-10-CM | POA: Diagnosis not present

## 2020-05-20 ENCOUNTER — Other Ambulatory Visit: Payer: Self-pay | Admitting: Family Medicine

## 2020-05-20 DIAGNOSIS — Z1231 Encounter for screening mammogram for malignant neoplasm of breast: Secondary | ICD-10-CM

## 2020-06-01 DIAGNOSIS — M1991 Primary osteoarthritis, unspecified site: Secondary | ICD-10-CM | POA: Diagnosis not present

## 2020-06-01 DIAGNOSIS — I10 Essential (primary) hypertension: Secondary | ICD-10-CM | POA: Diagnosis not present

## 2020-06-01 DIAGNOSIS — E7849 Other hyperlipidemia: Secondary | ICD-10-CM | POA: Diagnosis not present

## 2020-06-01 DIAGNOSIS — F329 Major depressive disorder, single episode, unspecified: Secondary | ICD-10-CM | POA: Diagnosis not present

## 2020-06-21 DIAGNOSIS — M170 Bilateral primary osteoarthritis of knee: Secondary | ICD-10-CM | POA: Diagnosis not present

## 2020-06-28 DIAGNOSIS — H04123 Dry eye syndrome of bilateral lacrimal glands: Secondary | ICD-10-CM | POA: Diagnosis not present

## 2020-06-28 DIAGNOSIS — H353131 Nonexudative age-related macular degeneration, bilateral, early dry stage: Secondary | ICD-10-CM | POA: Diagnosis not present

## 2020-06-28 DIAGNOSIS — H401133 Primary open-angle glaucoma, bilateral, severe stage: Secondary | ICD-10-CM | POA: Diagnosis not present

## 2020-06-30 ENCOUNTER — Ambulatory Visit
Admission: RE | Admit: 2020-06-30 | Discharge: 2020-06-30 | Disposition: A | Discharge status: Home / Self Care | Payer: Medicare Other | Source: Ambulatory Visit | Attending: Family Medicine | Admitting: Family Medicine

## 2020-06-30 ENCOUNTER — Other Ambulatory Visit: Payer: Self-pay

## 2020-06-30 DIAGNOSIS — Z1231 Encounter for screening mammogram for malignant neoplasm of breast: Secondary | ICD-10-CM

## 2020-07-22 DIAGNOSIS — N1832 Chronic kidney disease, stage 3b: Secondary | ICD-10-CM | POA: Diagnosis not present

## 2020-07-22 DIAGNOSIS — E870 Hyperosmolality and hypernatremia: Secondary | ICD-10-CM | POA: Diagnosis not present

## 2020-07-22 DIAGNOSIS — R809 Proteinuria, unspecified: Secondary | ICD-10-CM | POA: Diagnosis not present

## 2020-07-29 DIAGNOSIS — I5032 Chronic diastolic (congestive) heart failure: Secondary | ICD-10-CM | POA: Diagnosis not present

## 2020-07-29 DIAGNOSIS — N1832 Chronic kidney disease, stage 3b: Secondary | ICD-10-CM | POA: Diagnosis not present

## 2020-07-29 DIAGNOSIS — E870 Hyperosmolality and hypernatremia: Secondary | ICD-10-CM | POA: Diagnosis not present

## 2020-07-29 DIAGNOSIS — N170 Acute kidney failure with tubular necrosis: Secondary | ICD-10-CM | POA: Diagnosis not present

## 2020-08-16 DIAGNOSIS — N170 Acute kidney failure with tubular necrosis: Secondary | ICD-10-CM | POA: Diagnosis not present

## 2020-08-16 DIAGNOSIS — I5032 Chronic diastolic (congestive) heart failure: Secondary | ICD-10-CM | POA: Diagnosis not present

## 2020-08-16 DIAGNOSIS — N1832 Chronic kidney disease, stage 3b: Secondary | ICD-10-CM | POA: Diagnosis not present

## 2020-08-16 DIAGNOSIS — E870 Hyperosmolality and hypernatremia: Secondary | ICD-10-CM | POA: Diagnosis not present

## 2020-08-19 DIAGNOSIS — R809 Proteinuria, unspecified: Secondary | ICD-10-CM | POA: Diagnosis not present

## 2020-08-19 DIAGNOSIS — N170 Acute kidney failure with tubular necrosis: Secondary | ICD-10-CM | POA: Diagnosis not present

## 2020-08-19 DIAGNOSIS — N1832 Chronic kidney disease, stage 3b: Secondary | ICD-10-CM | POA: Diagnosis not present

## 2020-08-19 DIAGNOSIS — I5032 Chronic diastolic (congestive) heart failure: Secondary | ICD-10-CM | POA: Diagnosis not present

## 2020-09-01 DIAGNOSIS — M1991 Primary osteoarthritis, unspecified site: Secondary | ICD-10-CM | POA: Diagnosis not present

## 2020-09-01 DIAGNOSIS — E782 Mixed hyperlipidemia: Secondary | ICD-10-CM | POA: Diagnosis not present

## 2020-09-01 DIAGNOSIS — I10 Essential (primary) hypertension: Secondary | ICD-10-CM | POA: Diagnosis not present

## 2020-09-14 DIAGNOSIS — Z23 Encounter for immunization: Secondary | ICD-10-CM | POA: Diagnosis not present

## 2020-10-01 DIAGNOSIS — E7849 Other hyperlipidemia: Secondary | ICD-10-CM | POA: Diagnosis not present

## 2020-10-01 DIAGNOSIS — I10 Essential (primary) hypertension: Secondary | ICD-10-CM | POA: Diagnosis not present

## 2020-10-01 DIAGNOSIS — M1991 Primary osteoarthritis, unspecified site: Secondary | ICD-10-CM | POA: Diagnosis not present

## 2020-10-14 DIAGNOSIS — N1832 Chronic kidney disease, stage 3b: Secondary | ICD-10-CM | POA: Diagnosis not present

## 2020-10-14 DIAGNOSIS — N170 Acute kidney failure with tubular necrosis: Secondary | ICD-10-CM | POA: Diagnosis not present

## 2020-10-14 DIAGNOSIS — R809 Proteinuria, unspecified: Secondary | ICD-10-CM | POA: Diagnosis not present

## 2020-10-14 DIAGNOSIS — I5032 Chronic diastolic (congestive) heart failure: Secondary | ICD-10-CM | POA: Diagnosis not present

## 2020-10-21 DIAGNOSIS — R809 Proteinuria, unspecified: Secondary | ICD-10-CM | POA: Diagnosis not present

## 2020-10-21 DIAGNOSIS — N1832 Chronic kidney disease, stage 3b: Secondary | ICD-10-CM | POA: Diagnosis not present

## 2020-10-21 DIAGNOSIS — E870 Hyperosmolality and hypernatremia: Secondary | ICD-10-CM | POA: Diagnosis not present

## 2020-10-21 DIAGNOSIS — I5032 Chronic diastolic (congestive) heart failure: Secondary | ICD-10-CM | POA: Diagnosis not present

## 2020-11-03 DIAGNOSIS — M170 Bilateral primary osteoarthritis of knee: Secondary | ICD-10-CM | POA: Diagnosis not present

## 2020-11-08 DIAGNOSIS — H401133 Primary open-angle glaucoma, bilateral, severe stage: Secondary | ICD-10-CM | POA: Diagnosis not present

## 2020-11-10 DIAGNOSIS — M170 Bilateral primary osteoarthritis of knee: Secondary | ICD-10-CM | POA: Diagnosis not present

## 2020-11-17 DIAGNOSIS — M170 Bilateral primary osteoarthritis of knee: Secondary | ICD-10-CM | POA: Diagnosis not present

## 2020-11-24 DIAGNOSIS — M79671 Pain in right foot: Secondary | ICD-10-CM | POA: Diagnosis not present

## 2020-11-24 DIAGNOSIS — M25561 Pain in right knee: Secondary | ICD-10-CM | POA: Diagnosis not present

## 2020-11-24 DIAGNOSIS — M170 Bilateral primary osteoarthritis of knee: Secondary | ICD-10-CM | POA: Diagnosis not present

## 2020-11-24 DIAGNOSIS — M25571 Pain in right ankle and joints of right foot: Secondary | ICD-10-CM | POA: Diagnosis not present

## 2020-11-28 DIAGNOSIS — M79671 Pain in right foot: Secondary | ICD-10-CM | POA: Diagnosis not present

## 2020-12-05 ENCOUNTER — Other Ambulatory Visit: Payer: Self-pay | Admitting: Orthopaedic Surgery

## 2020-12-05 ENCOUNTER — Other Ambulatory Visit: Payer: Self-pay

## 2020-12-05 ENCOUNTER — Encounter (HOSPITAL_COMMUNITY): Payer: Self-pay | Admitting: Orthopaedic Surgery

## 2020-12-05 DIAGNOSIS — M79674 Pain in right toe(s): Secondary | ICD-10-CM | POA: Diagnosis not present

## 2020-12-05 NOTE — Progress Notes (Signed)
Spoke with pt's daughter, Cassandra [name] for pre-op call. She states pt has hx of blood clots and a PE. Pt is on Eliquis. [name] states her last dose was this AM. Was told not to take tonight's dose or tomorrow morning dose. She states pt is followed by Dr. Phillips Odor. Pt is not diabetic.   Covid test will be done in AM on arrival.

## 2020-12-06 ENCOUNTER — Encounter (HOSPITAL_COMMUNITY): Payer: Self-pay | Admitting: Orthopaedic Surgery

## 2020-12-06 ENCOUNTER — Ambulatory Visit (HOSPITAL_COMMUNITY): Payer: Medicare Other

## 2020-12-06 ENCOUNTER — Ambulatory Visit (HOSPITAL_COMMUNITY): Payer: Medicare Other | Admitting: Certified Registered Nurse Anesthetist

## 2020-12-06 ENCOUNTER — Other Ambulatory Visit: Payer: Self-pay

## 2020-12-06 ENCOUNTER — Ambulatory Visit (HOSPITAL_COMMUNITY)
Admission: RE | Admit: 2020-12-06 | Discharge: 2020-12-07 | Disposition: A | Discharge status: Home / Self Care | Payer: Medicare Other | Attending: Orthopaedic Surgery | Admitting: Orthopaedic Surgery

## 2020-12-06 ENCOUNTER — Encounter (HOSPITAL_COMMUNITY)
Admission: RE | Disposition: A | Discharge status: Home / Self Care | Payer: Self-pay | Source: Home / Self Care | Attending: Orthopaedic Surgery

## 2020-12-06 DIAGNOSIS — Z8673 Personal history of transient ischemic attack (TIA), and cerebral infarction without residual deficits: Secondary | ICD-10-CM | POA: Diagnosis not present

## 2020-12-06 DIAGNOSIS — L02611 Cutaneous abscess of right foot: Secondary | ICD-10-CM | POA: Diagnosis not present

## 2020-12-06 DIAGNOSIS — M869 Osteomyelitis, unspecified: Secondary | ICD-10-CM | POA: Diagnosis not present

## 2020-12-06 DIAGNOSIS — Z8349 Family history of other endocrine, nutritional and metabolic diseases: Secondary | ICD-10-CM | POA: Insufficient documentation

## 2020-12-06 DIAGNOSIS — I129 Hypertensive chronic kidney disease with stage 1 through stage 4 chronic kidney disease, or unspecified chronic kidney disease: Secondary | ICD-10-CM | POA: Diagnosis not present

## 2020-12-06 DIAGNOSIS — Z79899 Other long term (current) drug therapy: Secondary | ICD-10-CM | POA: Insufficient documentation

## 2020-12-06 DIAGNOSIS — Z7901 Long term (current) use of anticoagulants: Secondary | ICD-10-CM | POA: Insufficient documentation

## 2020-12-06 DIAGNOSIS — L089 Local infection of the skin and subcutaneous tissue, unspecified: Secondary | ICD-10-CM | POA: Diagnosis present

## 2020-12-06 DIAGNOSIS — Z20822 Contact with and (suspected) exposure to covid-19: Secondary | ICD-10-CM | POA: Insufficient documentation

## 2020-12-06 DIAGNOSIS — Z809 Family history of malignant neoplasm, unspecified: Secondary | ICD-10-CM | POA: Diagnosis not present

## 2020-12-06 DIAGNOSIS — N1832 Chronic kidney disease, stage 3b: Secondary | ICD-10-CM | POA: Diagnosis not present

## 2020-12-06 DIAGNOSIS — Z89432 Acquired absence of left foot: Secondary | ICD-10-CM | POA: Diagnosis not present

## 2020-12-06 DIAGNOSIS — E785 Hyperlipidemia, unspecified: Secondary | ICD-10-CM | POA: Diagnosis not present

## 2020-12-06 DIAGNOSIS — Z8249 Family history of ischemic heart disease and other diseases of the circulatory system: Secondary | ICD-10-CM | POA: Insufficient documentation

## 2020-12-06 DIAGNOSIS — X58XXXA Exposure to other specified factors, initial encounter: Secondary | ICD-10-CM | POA: Insufficient documentation

## 2020-12-06 DIAGNOSIS — L97512 Non-pressure chronic ulcer of other part of right foot with fat layer exposed: Secondary | ICD-10-CM | POA: Diagnosis not present

## 2020-12-06 DIAGNOSIS — E1169 Type 2 diabetes mellitus with other specified complication: Secondary | ICD-10-CM | POA: Diagnosis not present

## 2020-12-06 DIAGNOSIS — S96011A Strain of muscle and tendon of long flexor muscle of toe at ankle and foot level, right foot, initial encounter: Secondary | ICD-10-CM | POA: Diagnosis not present

## 2020-12-06 DIAGNOSIS — Z885 Allergy status to narcotic agent status: Secondary | ICD-10-CM | POA: Insufficient documentation

## 2020-12-06 DIAGNOSIS — Z86711 Personal history of pulmonary embolism: Secondary | ICD-10-CM | POA: Diagnosis not present

## 2020-12-06 DIAGNOSIS — M009 Pyogenic arthritis, unspecified: Secondary | ICD-10-CM | POA: Insufficient documentation

## 2020-12-06 DIAGNOSIS — L97419 Non-pressure chronic ulcer of right heel and midfoot with unspecified severity: Secondary | ICD-10-CM | POA: Diagnosis not present

## 2020-12-06 DIAGNOSIS — M89371 Hypertrophy of bone, right ankle and foot: Secondary | ICD-10-CM | POA: Diagnosis not present

## 2020-12-06 HISTORY — DX: Cerebral infarction, unspecified: I63.9

## 2020-12-06 HISTORY — DX: Chronic kidney disease, unspecified: N18.9

## 2020-12-06 HISTORY — DX: Other pulmonary embolism without acute cor pulmonale: I26.99

## 2020-12-06 HISTORY — PX: FASCIOTOMY: SHX132

## 2020-12-06 HISTORY — DX: Gastro-esophageal reflux disease without esophagitis: K21.9

## 2020-12-06 LAB — BASIC METABOLIC PANEL
Anion gap: 14 (ref 5–15)
BUN: 21 mg/dL (ref 8–23)
CO2: 25 mmol/L (ref 22–32)
Calcium: 9.6 mg/dL (ref 8.9–10.3)
Chloride: 100 mmol/L (ref 98–111)
Creatinine, Ser: 1.37 mg/dL — ABNORMAL HIGH (ref 0.44–1.00)
GFR, Estimated: 38 mL/min — ABNORMAL LOW (ref >60)
Glucose, Bld: 90 mg/dL (ref 70–99)
Potassium: 3.7 mmol/L (ref 3.5–5.1)
Sodium: 139 mmol/L (ref 135–145)

## 2020-12-06 LAB — CBC
HCT: 42.9 % (ref 36.0–46.0)
Hemoglobin: 14.1 g/dL (ref 12.0–15.0)
MCH: 30.9 pg (ref 26.0–34.0)
MCHC: 32.9 g/dL (ref 30.0–36.0)
MCV: 93.9 fL (ref 80.0–100.0)
Platelets: 268 10*3/uL (ref 150–400)
RBC: 4.57 MIL/uL (ref 3.87–5.11)
RDW: 12.8 % (ref 11.5–15.5)
WBC: 11.6 10*3/uL — ABNORMAL HIGH (ref 4.0–10.5)
nRBC: 0 % (ref 0.0–0.2)

## 2020-12-06 LAB — SARS CORONAVIRUS 2 BY RT PCR (HOSPITAL ORDER, PERFORMED IN ~~LOC~~ HOSPITAL LAB): SARS Coronavirus 2: NEGATIVE

## 2020-12-06 SURGERY — FASCIOTOMY, UPPER EXTREMITY
Anesthesia: General | Laterality: Right

## 2020-12-06 MED ORDER — ONDANSETRON HCL 4 MG/2ML IJ SOLN
INTRAMUSCULAR | Status: DC | PRN
  Administered 2020-12-06: 4 mg via INTRAVENOUS

## 2020-12-06 MED ORDER — LIDOCAINE 2% (20 MG/ML) 5 ML SYRINGE
INTRAMUSCULAR | Status: DC | PRN
  Administered 2020-12-06: 60 mg via INTRAVENOUS

## 2020-12-06 MED ORDER — ACETAMINOPHEN 500 MG PO TABS
500.0000 mg | ORAL_TABLET | Freq: Four times a day (QID) | ORAL | Status: DC
  Administered 2020-12-07: 500 mg via ORAL
  Filled 2020-12-06: qty 1

## 2020-12-06 MED ORDER — VANCOMYCIN HCL 1000 MG IV SOLR
INTRAVENOUS | Status: DC | PRN
  Administered 2020-12-06: 1000 mg

## 2020-12-06 MED ORDER — OXYCODONE HCL 5 MG PO TABS: 5.0000 mg | ORAL_TABLET | Freq: Once | ORAL | Status: DC | PRN

## 2020-12-06 MED ORDER — DEXAMETHASONE SODIUM PHOSPHATE 10 MG/ML IJ SOLN
INTRAMUSCULAR | Status: DC | PRN
  Administered 2020-12-06: 10 mg via INTRAVENOUS

## 2020-12-06 MED ORDER — POLYSACCHARIDE IRON COMPLEX 150 MG PO CAPS
150.0000 mg | ORAL_CAPSULE | Freq: Every day | ORAL | Status: DC
  Administered 2020-12-07: 150 mg via ORAL
  Filled 2020-12-06 (×2): qty 1

## 2020-12-06 MED ORDER — ONDANSETRON HCL 4 MG PO TABS: 4.0000 mg | ORAL_TABLET | Freq: Four times a day (QID) | ORAL | Status: DC | PRN

## 2020-12-06 MED ORDER — SERTRALINE HCL 100 MG PO TABS
100.0000 mg | ORAL_TABLET | Freq: Every day | ORAL | Status: DC
  Administered 2020-12-07: 100 mg via ORAL
  Filled 2020-12-06: qty 1

## 2020-12-06 MED ORDER — LACTATED RINGERS IV SOLN: INTRAVENOUS | Status: DC

## 2020-12-06 MED ORDER — ORAL CARE MOUTH RINSE: 15.0000 mL | Freq: Once | OROMUCOSAL | Status: AC

## 2020-12-06 MED ORDER — DIPHENHYDRAMINE HCL 12.5 MG/5ML PO ELIX: 12.5000 mg | ORAL_SOLUTION | ORAL | Status: DC | PRN

## 2020-12-06 MED ORDER — METOCLOPRAMIDE HCL 5 MG/ML IJ SOLN: 5.0000 mg | Freq: Three times a day (TID) | INTRAMUSCULAR | Status: DC | PRN

## 2020-12-06 MED ORDER — CEFAZOLIN SODIUM-DEXTROSE 1-4 GM/50ML-% IV SOLN
1.0000 g | Freq: Four times a day (QID) | INTRAVENOUS | Status: AC
  Administered 2020-12-06 – 2020-12-07 (×3): 1 g via INTRAVENOUS
  Filled 2020-12-06 (×3): qty 50

## 2020-12-06 MED ORDER — FENTANYL CITRATE (PF) 250 MCG/5ML IJ SOLN
INTRAMUSCULAR | Status: AC
  Filled 2020-12-06: qty 5

## 2020-12-06 MED ORDER — VANCOMYCIN HCL 1000 MG IV SOLR
INTRAVENOUS | Status: AC
  Filled 2020-12-06: qty 1000

## 2020-12-06 MED ORDER — CEFAZOLIN SODIUM-DEXTROSE 2-4 GM/100ML-% IV SOLN
2.0000 g | INTRAVENOUS | Status: AC
  Administered 2020-12-06: 2 g via INTRAVENOUS

## 2020-12-06 MED ORDER — ACETAMINOPHEN 325 MG PO TABS
325.0000 mg | ORAL_TABLET | Freq: Four times a day (QID) | ORAL | Status: DC | PRN
Start: 2020-12-07 — End: 2020-12-07
  Administered 2020-12-07: 650 mg via ORAL
  Filled 2020-12-06: qty 2

## 2020-12-06 MED ORDER — FAMOTIDINE 20 MG PO TABS
20.0000 mg | ORAL_TABLET | Freq: Every day | ORAL | Status: DC
Start: 2020-12-07 — End: 2020-12-07
  Administered 2020-12-07: 20 mg via ORAL
  Filled 2020-12-06: qty 1

## 2020-12-06 MED ORDER — APIXABAN 5 MG PO TABS
5.0000 mg | ORAL_TABLET | Freq: Two times a day (BID) | ORAL | Status: DC
  Administered 2020-12-07: 5 mg via ORAL
  Filled 2020-12-06: qty 1

## 2020-12-06 MED ORDER — OXYCODONE HCL 5 MG/5ML PO SOLN: 5.0000 mg | Freq: Once | ORAL | Status: DC | PRN

## 2020-12-06 MED ORDER — NETARSUDIL-LATANOPROST 0.02-0.005 % OP SOLN: 1.0000 [drp] | Freq: Every day | OPHTHALMIC | Status: DC

## 2020-12-06 MED ORDER — FENTANYL CITRATE (PF) 100 MCG/2ML IJ SOLN: 25.0000 ug | INTRAMUSCULAR | Status: DC | PRN

## 2020-12-06 MED ORDER — METOPROLOL SUCCINATE ER 50 MG PO TB24
50.0000 mg | ORAL_TABLET | Freq: Every day | ORAL | Status: DC
  Administered 2020-12-07: 50 mg via ORAL
  Filled 2020-12-06: qty 1

## 2020-12-06 MED ORDER — ONDANSETRON HCL 4 MG/2ML IJ SOLN: 4.0000 mg | Freq: Once | INTRAMUSCULAR | Status: DC | PRN

## 2020-12-06 MED ORDER — AMISULPRIDE (ANTIEMETIC) 5 MG/2ML IV SOLN: 10.0000 mg | Freq: Once | INTRAVENOUS | Status: DC | PRN

## 2020-12-06 MED ORDER — METOCLOPRAMIDE HCL 5 MG PO TABS: 5.0000 mg | ORAL_TABLET | Freq: Three times a day (TID) | ORAL | Status: DC | PRN

## 2020-12-06 MED ORDER — PROPOFOL 10 MG/ML IV BOLUS
INTRAVENOUS | Status: DC | PRN
  Administered 2020-12-06: 130 mg via INTRAVENOUS

## 2020-12-06 MED ORDER — NAPROXEN 250 MG PO TABS
250.0000 mg | ORAL_TABLET | Freq: Two times a day (BID) | ORAL | Status: DC
  Administered 2020-12-07: 250 mg via ORAL
  Filled 2020-12-06: qty 1

## 2020-12-06 MED ORDER — ONDANSETRON HCL 4 MG/2ML IJ SOLN: 4.0000 mg | Freq: Four times a day (QID) | INTRAMUSCULAR | Status: DC | PRN

## 2020-12-06 MED ORDER — BUPIVACAINE HCL (PF) 0.5 % IJ SOLN
INTRAMUSCULAR | Status: AC
  Filled 2020-12-06: qty 30

## 2020-12-06 MED ORDER — BUPIVACAINE HCL (PF) 0.5 % IJ SOLN
INTRAMUSCULAR | Status: DC | PRN
  Administered 2020-12-06: 10 mL

## 2020-12-06 MED ORDER — FUROSEMIDE 40 MG PO TABS: 40.0000 mg | ORAL_TABLET | Freq: Every day | ORAL | Status: DC | PRN

## 2020-12-06 MED ORDER — FENTANYL CITRATE (PF) 250 MCG/5ML IJ SOLN
INTRAMUSCULAR | Status: DC | PRN
  Administered 2020-12-06: 50 ug via INTRAVENOUS

## 2020-12-06 MED ORDER — 0.9 % SODIUM CHLORIDE (POUR BTL) OPTIME
TOPICAL | Status: DC | PRN
  Administered 2020-12-06: 1000 mL

## 2020-12-06 MED ORDER — SODIUM CHLORIDE 0.9 % IR SOLN
Status: DC | PRN
  Administered 2020-12-06: 3000 mL

## 2020-12-06 MED ORDER — FAMOTIDINE 20 MG PO TABS: 20.0000 mg | ORAL_TABLET | Freq: Every day | ORAL | Status: DC

## 2020-12-06 MED ORDER — DOCUSATE SODIUM 100 MG PO CAPS
100.0000 mg | ORAL_CAPSULE | Freq: Two times a day (BID) | ORAL | Status: DC
  Administered 2020-12-06 – 2020-12-07 (×2): 100 mg via ORAL
  Filled 2020-12-06 (×2): qty 1

## 2020-12-06 MED ORDER — CHLORHEXIDINE GLUCONATE 0.12 % MT SOLN
15.0000 mL | Freq: Once | OROMUCOSAL | Status: AC
  Administered 2020-12-06: 15 mL via OROMUCOSAL
  Filled 2020-12-06: qty 15

## 2020-12-06 MED ORDER — PHENYLEPHRINE HCL-NACL 10-0.9 MG/250ML-% IV SOLN
INTRAVENOUS | Status: DC | PRN
  Administered 2020-12-06: 15 ug/min via INTRAVENOUS

## 2020-12-06 SURGICAL SUPPLY — 46 items
BANDAGE ESMARK 6X9 LF (GAUZE/BANDAGES/DRESSINGS) IMPLANT
BNDG CMPR 9X4 STRL LF SNTH (GAUZE/BANDAGES/DRESSINGS)
BNDG CMPR 9X6 STRL LF SNTH (GAUZE/BANDAGES/DRESSINGS)
BNDG CMPR MED 10X6 ELC LF (GAUZE/BANDAGES/DRESSINGS) ×1
BNDG COHESIVE 4X5 TAN STRL (GAUZE/BANDAGES/DRESSINGS) IMPLANT
BNDG ELASTIC 4X5.8 VLCR STR LF (GAUZE/BANDAGES/DRESSINGS) ×1 IMPLANT
BNDG ELASTIC 6X10 VLCR STRL LF (GAUZE/BANDAGES/DRESSINGS) ×2 IMPLANT
BNDG ESMARK 4X9 LF (GAUZE/BANDAGES/DRESSINGS) IMPLANT
BNDG ESMARK 6X9 LF (GAUZE/BANDAGES/DRESSINGS)
BNDG GAUZE ELAST 4 BULKY (GAUZE/BANDAGES/DRESSINGS) ×2 IMPLANT
COVER SURGICAL LIGHT HANDLE (MISCELLANEOUS) ×4 IMPLANT
COVER WAND RF STERILE (DRAPES) IMPLANT
CUFF TOURN SGL QUICK 34 (TOURNIQUET CUFF) ×2
CUFF TRNQT CYL 34X4.125X (TOURNIQUET CUFF) ×1 IMPLANT
DRAPE U-SHAPE 47X51 STRL (DRAPES) ×2 IMPLANT
DRSG PAD ABDOMINAL 8X10 ST (GAUZE/BANDAGES/DRESSINGS) IMPLANT
DRSG XEROFORM 1X8 (GAUZE/BANDAGES/DRESSINGS) IMPLANT
DURAPREP 26ML APPLICATOR (WOUND CARE) ×2 IMPLANT
ELECT REM PT RETURN 9FT ADLT (ELECTROSURGICAL) ×2
ELECTRODE REM PT RTRN 9FT ADLT (ELECTROSURGICAL) ×1 IMPLANT
GAUZE SPONGE 4X4 12PLY STRL (GAUZE/BANDAGES/DRESSINGS) ×1 IMPLANT
GAUZE SPONGE 4X4 12PLY STRL LF (GAUZE/BANDAGES/DRESSINGS) ×2 IMPLANT
GAUZE XEROFORM 1X8 LF (GAUZE/BANDAGES/DRESSINGS) ×2 IMPLANT
GLOVE BIOGEL M STRL SZ7.5 (GLOVE) ×2 IMPLANT
GLOVE INDICATOR 8.0 STRL GRN (GLOVE) ×2 IMPLANT
GOWN STRL REUS W/ TWL LRG LVL3 (GOWN DISPOSABLE) ×1 IMPLANT
GOWN STRL REUS W/ TWL XL LVL3 (GOWN DISPOSABLE) ×1 IMPLANT
GOWN STRL REUS W/TWL LRG LVL3 (GOWN DISPOSABLE) ×2
GOWN STRL REUS W/TWL XL LVL3 (GOWN DISPOSABLE) ×2
HANDPIECE INTERPULSE COAX TIP (DISPOSABLE) ×2
KIT BASIN OR (CUSTOM PROCEDURE TRAY) ×2 IMPLANT
KIT TURNOVER KIT B (KITS) ×2 IMPLANT
MANIFOLD NEPTUNE II (INSTRUMENTS) ×2 IMPLANT
NEEDLE 22X1 1/2 (OR ONLY) (NEEDLE) IMPLANT
NS IRRIG 1000ML POUR BTL (IV SOLUTION) ×2 IMPLANT
PACK ORTHO EXTREMITY (CUSTOM PROCEDURE TRAY) ×2 IMPLANT
PAD ARMBOARD 7.5X6 YLW CONV (MISCELLANEOUS) ×4 IMPLANT
PAD CAST 4YDX4 CTTN HI CHSV (CAST SUPPLIES) ×1 IMPLANT
PADDING CAST COTTON 4X4 STRL (CAST SUPPLIES) ×2
SET HNDPC FAN SPRY TIP SCT (DISPOSABLE) ×1 IMPLANT
SPONGE LAP 18X18 RF (DISPOSABLE) IMPLANT
STOCKINETTE IMPERVIOUS 9X36 MD (GAUZE/BANDAGES/DRESSINGS) IMPLANT
SUT ETHILON 2 0 FS 18 (SUTURE) IMPLANT
TUBE CONNECTING 12X1/4 (SUCTIONS) ×2 IMPLANT
UNDERPAD 30X36 HEAVY ABSORB (UNDERPADS AND DIAPERS) ×2 IMPLANT
YANKAUER SUCT BULB TIP NO VENT (SUCTIONS) ×2 IMPLANT

## 2020-12-06 NOTE — H&P (Signed)
PREOPERATIVE H&P  Chief Complaint: Right forefoot infection with plantar callus, ulcer and sesamoid hypertrophy  HPI: Cassandra [name] is a 87 y.o. female who presents for preoperative history and physical with a diagnosis of left foot infection. She was seen in my office yesterday after being referred from an outside orthopedist. She had been on antibiotics for 10 days. There was aspiration performed of her forefoot abscess that revealed group B strep. MRI after antibiotics revealed continued abscess and some bone edema within the medial sesamoid and first metatarsal. She had improvement in pain with oral antibiotics but had continued swelling and there was concern for incomplete eradication of the infection. She is here today for incision and drainage of her abscess with first MTP arthrotomy and partial sesamoidectomy and possible first metatarsal bone biopsy. She denies systemic symptoms. She does not have a history of diabetes. She does take Eliquis.. Symptoms are rated as moderate to severe, and have been worsening.  This is significantly impairing activities of daily living.  She has elected for surgical management.   Past Medical History:  Diagnosis Date  . Arthritis   . Cellulitis and abscess of toe of left foot 09/16/2014  . Chronic kidney disease    Stage 3B  . Depression   . GERD (gastroesophageal reflux disease)   . H/O cardiovascular stress test 10/13/2007   low risk scan, no changes from previous test  . H/O echocardiogram 2008   prolapse of post. mitral leaflet, tr MR, tr TR, EF >55%, mild concentric LVH  . Heart palpitations    controlled on BB  . Hiatal hernia   . Hyperlipidemia   . Hypertension   . PFO (patent foramen ovale)    small  . Pulmonary embolus (HCC)   . Stroke [facility])    mini strokes    Past Surgical History:  Procedure Laterality Date  . AMPUTATION Left 09/20/2014   Procedure: AMPUTATION DIGIT (LEFT GREAT TOE);  Surgeon: Dallas Schimke, DPM;   Location: [facility];  Service: Podiatry;  Laterality: Left;  . AMPUTATION Left 01/08/2016   Procedure: AMPUTATION TRANSMETATARSAL LEFT FOOT and achilles tendon lengthening;  Surgeon: Toni Arthurs, MD;  Location: [facility] OR;  Service: Orthopedics;  Laterality: Left;  . BONE BIOPSY Left 09/17/2014   Procedure: BONE BIOPSY;  Surgeon: Dallas Schimke, DPM;  Location: [facility];  Service: Podiatry;  Laterality: Left;  . CARDIAC CATHETERIZATION  10/2002   normal coronary arteries, normal LV function  . CHOLECYSTECTOMY    . INCISION AND DRAINAGE Right 07/21/2014   Procedure: INCISION AND DRAINAGE;  Surgeon: Dallas Schimke, DPM;  Location: [facility];  Service: Podiatry;  Laterality: Right;  . INCISION AND DRAINAGE Left 09/17/2014   Procedure: INCISION AND DRAINAGE 1ST TOE LEFT FOOT;  Surgeon: Dallas Schimke, DPM;  Location: [facility];  Service: Podiatry;  Laterality: Left;  . TEE WITHOUT CARDIOVERSION N/A 01/05/2016   Procedure: TRANSESOPHAGEAL ECHOCARDIOGRAM (TEE);  Surgeon: Thurmon Fair, MD;  Location: [facility] ENDOSCOPY;  Service: Cardiovascular;  Laterality: N/A;   Social History   Socioeconomic History  . Marital status: Married    Spouse name: Not on file  . Number of children: Not on file  . Years of education: Not on file  . Highest education level: Not on file  Occupational History  . Not on file  Tobacco Use  . Smoking status: Never Smoker  . Smokeless tobacco: Never Used  Vaping Use  . Vaping Use: Never used  Substance and Sexual Activity  .  Alcohol use: No  . Drug use: No  . Sexual activity: Not on file  Other Topics Concern  . Not on file  Social History Narrative  . Not on file   Social Determinants of Health   Financial Resource Strain: Not on file  Food Insecurity: Not on file  Transportation Needs: Not on file  Physical Activity: Not on file  Stress: Not on file  Social Connections: Not on file   Family History  Problem Relation Age of Onset  . Heart disease  Mother   . Cancer Father   . Kidney disease Brother   . Hypertension Daughter   . Hyperlipidemia Son   . Breast cancer Neg Hx    Allergies  Allergen Reactions  . Codeine Shortness Of Breath and Nausea And Vomiting  . Hydrocodone Shortness Of Breath and Nausea And Vomiting   Prior to Admission medications   Medication Sig Start Date End Date Taking? Authorizing Provider  apixaban (ELIQUIS) 5 MG TABS tablet Take 2 tablets (10 mg total) by mouth 2 (two) times daily. Patient taking differently: Take 5 mg by mouth 2 (two) times daily. 02/20/16  Yes Velvet Bathe, MD  calcium carbonate (OS-CAL - DOSED IN MG OF ELEMENTAL CALCIUM) 1250 (500 Ca) MG tablet Take 2 tablets by mouth daily with breakfast.   Yes [provider]  cholecalciferol (VITAMIN D3) 25 MCG (1000 UNIT) tablet Take 1,000 Units by mouth daily.   Yes [provider]  famotidine (PEPCID) 20 MG tablet Take 20 mg by mouth daily.   Yes [provider]  furosemide (LASIX) 40 MG tablet Take 1 tablet (40 mg total) by mouth daily. Patient taking differently: Take 40 mg by mouth daily as needed for fluid. 02/16/16  Yes Velvet Bathe, MD  iron polysaccharides (NIFEREX) 150 MG capsule Take 150 mg by mouth daily. 08/24/20  Yes [provider]  metoprolol succinate (TOPROL-XL) 50 MG 24 hr tablet Take 50 mg by mouth daily. 09/28/20  Yes [provider]  Multiple Vitamins-Minerals (ICAPS PO) Take 1 capsule by mouth daily.   Yes [provider]  ROCKLATAN 0.02-0.005 % SOLN Place 1 drop into both eyes at bedtime. 11/21/20  Yes [provider]  sertraline (ZOLOFT) 100 MG tablet Take 100 mg by mouth daily.  08/11/13  Yes [provider]     Positive ROS: All other systems have been reviewed and were otherwise negative with the exception of those mentioned in the HPI and as above.  Physical Exam:  Vitals:   12/06/20 1340  BP: (!) 121/98  Pulse: (!) 58  Resp: 18  Temp: 97.6 F  (36.4 C)  SpO2: 99%   General: Alert, no acute distress Cardiovascular: No pedal edema Respiratory: No cyanosis, no use of accessory musculature GI: No organomegaly, abdomen is soft and non-tender Skin: No lesions in the area of chief complaint Neurologic: Sensation intact distally Psychiatric: Patient is competent for consent with normal mood and affect Lymphatic: No axillary or cervical lymphadenopathy  MUSCULOSKELETAL: Right foot demonstrates swelling about the first MTP joint with fluctuance noted medially. She has tenderness to palpation in this area. She has pain with manipulation of the first MTP joint. She does have plantar callus and hypertrophic bone notable. There is a small ulcer on the plantar forefoot as well. She endorses sensation to light touch. Foot is warm and well-perfused.  Assessment: Right forefoot abscess with first MTP septic arthritis Flexor hallucis longus tenosynovitis Medial sesamoid hypertrophy and possible osteomyelitis  Plan: Plan for incision and drainage of her abscess and debridement including soft tissue, tendon and bone We will plan for sesamoid shaving versus sesamoidectomy due to hypertrophic bone and send this for culture as well. We will plan for first MTP arthrotomy and washout.  He did have an aspiration done by an outside provider which grew group B strep. We will send further cultures and consult hospitalist for assistance with medical management and antibiotic treatment of her infection.  We discussed the risks, benefits and alternatives of surgery which include but are not limited to wound healing complications, infection, nonunion, malunion, need for further surgery, damage to surrounding structures and continued pain.  They understand there is no guarantees to an acceptable outcome.  After weighing these risks they opted to proceed with surgery.     Terance Hart, MD    12/06/2020 4:06 PM

## 2020-12-06 NOTE — Anesthesia Postprocedure Evaluation (Signed)
Anesthesia Post Note  Patient: Cassandra [name]  Procedure(s) Performed: INCISION AND DRAINAGE OF RIGHT FOOT WITH HALLUX ARTHROTOMY, MEDIAL SESAMOIECTOMY (Right )     Patient location during evaluation: PACU Anesthesia Type: General Level of consciousness: awake and alert Pain management: pain level controlled Vital Signs Assessment: post-procedure vital signs reviewed and stable Respiratory status: spontaneous breathing, nonlabored ventilation, respiratory function stable and patient connected to nasal cannula oxygen Cardiovascular status: blood pressure returned to baseline and stable Postop Assessment: no apparent nausea or vomiting Anesthetic complications: no   No complications documented.  Last Vitals:  Vitals:   12/06/20 1340  BP: (!) 121/98  Pulse: (!) 58  Resp: 18  Temp: 36.4 C  SpO2: 99%    Last Pain:  Vitals:   12/06/20 1340  TempSrc: Oral  PainSc: 0-No pain                 Trevor Iha

## 2020-12-06 NOTE — Transfer of Care (Signed)
Immediate Anesthesia Transfer of Care Note  Patient: Cassandra [name]  Procedure(s) Performed: INCISION AND DRAINAGE OF RIGHT FOOT WITH HALLUX ARTHROTOMY, MEDIAL SESAMOIECTOMY (Right )  Patient Location: PACU  Anesthesia Type:General  Level of Consciousness: awake, alert  and oriented  Airway & Oxygen Therapy: Patient Spontanous Breathing  Post-op Assessment: Report given to RN and Post -op Vital signs reviewed and stable  Post vital signs: Reviewed and stable  Last Vitals:  Vitals Value Taken Time  BP 140/61 12/06/20 1722  Temp    Pulse 52 12/06/20 1726  Resp 13 12/06/20 1726  SpO2 98 % 12/06/20 1726  Vitals shown include unvalidated device data.  Last Pain:  Vitals:   12/06/20 1340  TempSrc: Oral  PainSc: 0-No pain      Patients Stated Pain Goal: 2 (12/06/20 1340)  Complications: No complications documented.

## 2020-12-06 NOTE — Anesthesia Procedure Notes (Signed)
Procedure Name: LMA Insertion Date/Time: 12/06/2020 4:25 PM Performed by: Dairl Ponder, CRNA Pre-anesthesia Checklist: Patient identified, Emergency Drugs available, Suction available, Patient being monitored and Timeout performed Patient Re-evaluated:Patient Re-evaluated prior to induction Oxygen Delivery Method: Circle system utilized Preoxygenation: Pre-oxygenation with 100% oxygen Induction Type: IV induction LMA: LMA flexible inserted LMA Size: 4.0 Number of attempts: 1 Placement Confirmation: positive ETCO2 and breath sounds checked- equal and bilateral Tube secured with: Tape Dental Injury: Teeth and Oropharynx as per pre-operative assessment

## 2020-12-06 NOTE — Anesthesia Preprocedure Evaluation (Addendum)
Anesthesia Evaluation  Patient identified by MRN, date of birth, ID band Patient awake    Reviewed: Allergy & Precautions, NPO status , Patient's Chart, lab work & pertinent test results  Airway Mallampati: II  TM Distance: >3 FB Neck ROM: Full    Dental  (+) Upper Dentures, Lower Dentures   Pulmonary shortness of breath and with exertion, PE   Pulmonary exam normal        Cardiovascular hypertension, Pt. on home beta blockers +CHF (grade 1 diastolic dysfunction)  Normal cardiovascular exam+ Valvular Problems/Murmurs (mod MR, mod TR) MR   Echo 2017: - Left ventricle: The cavity size was normal. There was mild  concentric hypertrophy. Systolic function was normal. The  estimated ejection fraction was in the range of 60% to 65%. Wall  motion was normal; there were no regional wall motion  abnormalities. Doppler parameters are consistent with abnormal  left ventricular relaxation (grade 1 diastolic dysfunction).  Doppler parameters are consistent with high ventricular filling  pressure.  - Aortic valve: Trileaflet; mildly thickened, mildly calcified  leaflets.  - Mitral valve: Calcified annulus. Mildly thickened leaflets .  There was moderate regurgitation.  - Tricuspid valve: There was moderate regurgitation.    Neuro/Psych PSYCHIATRIC DISORDERS Depression CVA, No Residual Symptoms    GI/Hepatic Neg liver ROS, hiatal hernia, GERD  Medicated and Controlled,  Endo/Other  negative endocrine ROS  Renal/GU CRFRenal diseaseCKD 3     Musculoskeletal  (+) Arthritis , Osteoarthritis,  Right foot abscess, osetomyelitis   Abdominal   Peds  Hematology negative hematology ROS (+)   Anesthesia Other Findings   Reproductive/Obstetrics                           Anesthesia Physical Anesthesia Plan  ASA: III  Anesthesia Plan: General   Post-op Pain Management:    Induction:  Intravenous  PONV Risk Score and Plan: 3 and Ondansetron, Dexamethasone, Midazolam and Treatment may vary due to age or medical condition  Airway Management Planned: LMA  Additional Equipment: None  Intra-op Plan:   Post-operative Plan: Extubation in OR  Informed Consent: I have reviewed the patients History and Physical, chart, labs and discussed the procedure including the risks, benefits and alternatives for the proposed anesthesia with the patient or authorized representative who has indicated his/her understanding and acceptance.     Dental advisory given  Plan Discussed with:   Anesthesia Plan Comments:         Anesthesia Quick Evaluation

## 2020-12-06 NOTE — Op Note (Signed)
Cassandra [name] female 87 y.o. 12/06/2020  PreOperative Diagnosis: Right foot plantar ulcer with underlying abscess Right foot medial sesamoid hypertrophy Right foot first MTP septic arthritis  PostOperative Diagnosis: Same  PROCEDURE: Debridement of right foot ulcer including skin, subcutaneous tissue, tendon and bone, 5 x 2 cm Medial sesamoidectomy Right first MTP arthrotomy and synovectomy  SURGEON: Dub Mikes, MD  ASSISTANT: Danielle Rankin, OPA-C  (Present and scrubbed throughout the case, critical for assistance with exposure, retraction, and closure.)     ANESTHESIA: General LMA anesthesia with infiltration of quarter percent Marcaine plain  FINDINGS: Ulcer on the plantar aspect of the right forefoot, medial sesamoid hypertrophy at the level of the ulceration with underlying necrotic tissue and synovial tissue.  No gross purulence noted.  Increased synovial fluid within the first MTP joint  IMPLANTS: None  INDICATIONS:87 y.o. female was seen in my office yesterday with worsening swelling about her forefoot.  She had an ulcer under the plantar aspect of her forefoot near the level of the medial sesamoid.  She has been seen by an outside provider where MRI was concerning for soft tissue abscess and flexor hallucis longus septic tenosynovitis and first MTP septic arthritis with concern for osteomyelitis within the medial sesamoid that was hypertrophic.  She had been placed on antibiotics for 10 days and had had some improvement but not complete improvement.  Given the MRI findings were after the antibiotic she was indicated for surgery.   Patient understood the risks, benefits and alternatives to surgery which include but are not limited to wound healing complications, infection, nonunion, malunion, need for further surgery as well as damage to surrounding structures. They also understood the potential for continued pain in that there were no guarantees of acceptable  outcome After weighing these risks the patient opted to proceed with surgery.  PROCEDURE: Patient was identified in the preoperative holding area.  The right foot was marked by myself.  Consent was signed by myself and the patient.   Patient was taken to the operative suite and placed supine on the operative table.  General LMA anesthesia was induced without difficulty. Bump was placed under the operative hip and bone foam was used.  All bony prominences were well padded.  Preoperative antibiotics were given. The extremity was prepped and draped in the usual sterile fashion and surgical timeout was performed.  4 inch Esmarch tourniquet was placed around the ankle  We began by making a longitudinal incision on the medial aspect of the foot at the level of the first MTP joint.  It was taken sharply down through skin and subcutaneous tissue.  Blunt dissection was used to mobilize skin flaps.  Below the skin there was necrotic fatty tissue that was notable within the abscess cavity.  There was no rush of purulence.  The fatty tissue was debrided sharply with a 15 blade and sent for culture.  There was thickened tissue below the level of the ulceration.  We turned our attention to the ulcer and debrided the skin and subcutaneous tissue in this area.  The cavity tracked to the plantar forefoot region under the medial sesamoid.  There was notable hypertrophic sesamoid.  We did careful excisional debridement of the underlying necrotic fat, tendon sheath, portion of the abductor hallucis tendon and flexor houses longus tendon sheath as well as first metatarsal.  Tissue and bone were sent for culture.  The abscess cavity was approximately 2 x 5 cm.  We then turned our attention to the medial  aspect of the first MTP joint.  Separate deep incision was created through the capsular tissue along the medial eminence into the ankle joint performing an arthrotomy.  There was a rush of synovial fluid.  No gross purulence.   Synovial tissue from the joint was removed via sharp excision with a 15 blade and dissecting scissors and sent for culture separately.  The joint was further inspected and found to be without evidence of damage.  Using direct palpation it was notable that the ulceration was on the plantar aspect of the hypertrophic medial sesamoid.  The medial sesamoid was accessed through the medial incision and grabbed with a Coker.  Using a 15 blade the sesamoid was removed sharply.  It was then pulverized and sent for culture.  The soft tissue within this area was further debrided excisionally with a 15 blade and sent for culture.  3 separate culture containers were sent.  Using a dissecting scissor the flexor houses longus tendon sheath was incised in line with the tendon proximally.  Then the wound was copiously irrigated with normal saline.  Approximately 1500 cc of normal saline was infiltrated throughout the wound.  Further inspection found adequate debridement of the soft tissues.  Then using a 4-0 Monocryl suture the flexor hallucis brevis tendon was repaired back to the capsular tissue attachment at the site of the proximal phalanx of the hallux.  Vancomycin powder was placed within the ulcer site and deep cavity at the site of the sesamoidectomy and flexor hallucis longus tendon sheath.  Then the deep tissue was closed further using 4-0 Monocryl suture.  Then the skin was closed in a layered fashion using 4-0 Monocryl suture and three 2-0 nylon suture.  She was placed in a soft dressing.  All counts were correct at the end the case.  She was awake from anesthesia and taken recovery in stable condition.  There were no complications.  POST OPERATIVE INSTRUCTIONS: Patient will be admitted to the hospital overnight for IV antibiotics She will be discharged tomorrow on oral antibiotics Keep dressing in place Heel weightbearing  TOURNIQUET TIME: Less than 1 hour  BLOOD LOSS:  Minimal         DRAINS:  none         SPECIMEN: none       COMPLICATIONS:  * No complications entered in OR log *         Disposition: PACU - hemodynamically stable.         Condition: stable

## 2020-12-07 ENCOUNTER — Encounter (HOSPITAL_COMMUNITY): Payer: Self-pay | Admitting: Orthopaedic Surgery

## 2020-12-07 DIAGNOSIS — Z89432 Acquired absence of left foot: Secondary | ICD-10-CM | POA: Diagnosis not present

## 2020-12-07 DIAGNOSIS — L97419 Non-pressure chronic ulcer of right heel and midfoot with unspecified severity: Secondary | ICD-10-CM | POA: Diagnosis not present

## 2020-12-07 DIAGNOSIS — L02611 Cutaneous abscess of right foot: Secondary | ICD-10-CM | POA: Diagnosis not present

## 2020-12-07 DIAGNOSIS — M89371 Hypertrophy of bone, right ankle and foot: Secondary | ICD-10-CM | POA: Diagnosis not present

## 2020-12-07 DIAGNOSIS — Z8249 Family history of ischemic heart disease and other diseases of the circulatory system: Secondary | ICD-10-CM | POA: Diagnosis not present

## 2020-12-07 DIAGNOSIS — Z7901 Long term (current) use of anticoagulants: Secondary | ICD-10-CM | POA: Diagnosis not present

## 2020-12-07 DIAGNOSIS — N1832 Chronic kidney disease, stage 3b: Secondary | ICD-10-CM | POA: Diagnosis not present

## 2020-12-07 DIAGNOSIS — Z8673 Personal history of transient ischemic attack (TIA), and cerebral infarction without residual deficits: Secondary | ICD-10-CM | POA: Diagnosis not present

## 2020-12-07 DIAGNOSIS — Z86711 Personal history of pulmonary embolism: Secondary | ICD-10-CM | POA: Diagnosis not present

## 2020-12-07 DIAGNOSIS — Z8349 Family history of other endocrine, nutritional and metabolic diseases: Secondary | ICD-10-CM | POA: Diagnosis not present

## 2020-12-07 DIAGNOSIS — M009 Pyogenic arthritis, unspecified: Secondary | ICD-10-CM | POA: Diagnosis not present

## 2020-12-07 DIAGNOSIS — Z809 Family history of malignant neoplasm, unspecified: Secondary | ICD-10-CM | POA: Diagnosis not present

## 2020-12-07 DIAGNOSIS — Z885 Allergy status to narcotic agent status: Secondary | ICD-10-CM | POA: Diagnosis not present

## 2020-12-07 DIAGNOSIS — Z20822 Contact with and (suspected) exposure to covid-19: Secondary | ICD-10-CM | POA: Diagnosis not present

## 2020-12-07 DIAGNOSIS — I129 Hypertensive chronic kidney disease with stage 1 through stage 4 chronic kidney disease, or unspecified chronic kidney disease: Secondary | ICD-10-CM | POA: Diagnosis not present

## 2020-12-07 DIAGNOSIS — Z79899 Other long term (current) drug therapy: Secondary | ICD-10-CM | POA: Diagnosis not present

## 2020-12-07 MED ORDER — CEPHALEXIN 500 MG PO CAPS: 500.0000 mg | ORAL_CAPSULE | Freq: Four times a day (QID) | ORAL | 0 refills | Status: AC

## 2020-12-07 NOTE — TOC Initial Note (Addendum)
Transition of Care [facility]) - Initial/Assessment Note    Patient Details  Name: Cassandra [name] MRN: [id number] Date of Birth: [birth year]  [facility]) CM/SW Contact:    Mearl Latin, LCSW Phone Number: 12/07/2020, 1:55 PM  Clinical Narrative:                 CSW received consult for possible SNF at time of discharge. CSW spoke with patient. Patient reported that she would like home health services and does not want to go to SNF. Patient reports preference for [facility] since she has used them before and liked their care. CSW sent referral for review. CSW provided Medicare HH ratings list. CSW discussed equipment needs with patient and she reported she already has a walker and bedside commode. CSW confirmed PCP and address with patient. Patient states her daughter will come pick her up at discharge. She provided permission for CSW to contact her daughter. CSW spoke with her daughter, Cassandra [name], who confirmed plan for patient to return home with home health and can be called to pick patient up.   Update: [facility] is able to accept patient for PT.   Expected Discharge Plan: Home w Home Health Services Barriers to Discharge: No Barriers Identified   Patient Goals and CMS Choice Patient states their goals for this hospitalization and ongoing recovery are:: Return home CMS Medicare.gov Compare Post Acute Care list provided to:: Patient Choice offered to / list presented to : Patient  Expected Discharge Plan and Services Expected Discharge Plan: Home w Home Health Services In-house Referral: Clinical Social Work Discharge Planning Services: CM Consult Post Acute Care Choice: Home Health Living arrangements for the past 2 months: Single Family Home Expected Discharge Date: 12/07/20               DME Arranged: N/A         HH Arranged: PT HH Agency: [facility] Date HH Agency Contacted: 12/07/20 Time HH Agency Contacted:  [phone] Representative spoke with at [facility] Agency: Pearson Grippe  Prior Living Arrangements/Services Living arrangements for the past 2 months: Single Family Home Lives with:: Adult Children Patient language and need for interpreter reviewed:: Yes Do you feel safe going back to the place where you live?: Yes      Need for Family Participation in Patient Care: No (Comment) Care giver support system in place?: Yes (comment) Current home services: DME (walker, [facility]) Criminal Activity/Legal Involvement Pertinent to Current Situation/Hospitalization: No - Comment as needed  Activities of Daily Living Home Assistive Devices/Equipment: Walker (specify type),Wheelchair,Cane (specify quad or straight),Bedside commode/3-in-1,Shower chair with back,Blood pressure cuff ADL Screening (condition at time of admission) Patient's cognitive ability adequate to safely complete daily activities?: Yes Is the patient deaf or have difficulty hearing?: No Does the patient have difficulty seeing, even when wearing glasses/contacts?: No Does the patient have difficulty concentrating, remembering, or making decisions?: No Patient able to express need for assistance with ADLs?: Yes Does the patient have difficulty dressing or bathing?: Yes Independently performs ADLs?: No Communication: Independent Dressing (OT): Needs assistance Is this a change from baseline?: Pre-admission baseline Grooming: Needs assistance Is this a change from baseline?: Pre-admission baseline Bathing: Independent In/Out Bed: Needs assistance Is this a change from baseline?: Pre-admission baseline Walks in Home: Needs assistance Is this a change from baseline?: Pre-admission baseline Does the patient have difficulty walking or climbing stairs?: Yes Weakness of Legs: Both Weakness of Arms/Hands: Both  Permission Sought/Granted Permission sought to  share information with : Facility Industrial/product designer granted to share information with  : Yes, Verbal Permission Granted  Share Information with NAME: Cassandra [name]  Permission granted to share info w AGENCY: HH  Permission granted to share info w Relationship: Daughter  Permission granted to share info w Contact Information: [phone]  Emotional Assessment Appearance:: Appears stated age Attitude/Demeanor/Rapport: Engaged,Charismatic,Gracious Affect (typically observed): Accepting,Appropriate,Pleasant Orientation: : Oriented to Self,Oriented to Place,Oriented to  Time,Oriented to Situation Alcohol / Substance Use: Not Applicable Psych Involvement: No (comment)  Admission diagnosis:  Right foot infection [L08.9] Patient Active Problem List   Diagnosis Date Noted  . Right foot infection 12/06/2020  . Discitis of lumbar region   . Acute pulmonary embolism (HCC) 02/10/2016  . Pulmonary embolism (HCC) 02/10/2016  . GERD (gastroesophageal reflux disease) 02/09/2016  . Altered mental status   . Open wound of left foot   . S/P amputation of foot (HCC)   . Chest tightness 01/08/2016  . Chest pain   . Shortness of breath   . Loose stools   . Chronic osteomyelitis of left foot (HCC)   . Cerebral embolism with cerebral infarction 01/06/2016  . Bacterial endocarditis   . Sepsis (HCC) 01/05/2016  . Bacteremia   . Diminished pulses in lower extremity   . Emesis   . Staphylococcus aureus bacteremia 01/01/2016  . Gastroenteritis 12/29/2015  . Glaucoma 12/29/2015  . Normocytic anemia 09/17/2014  . Septic arthritis of interphalangeal joint of toe of left foot (HCC) 09/16/2014  . Cellulitis and abscess of toe of left foot 09/16/2014  . Osteomyelitis of toe of left foot (HCC) 09/16/2014  . Hypertension   . Hyperlipidemia   . PFO (patent foramen ovale)   . H/O echocardiogram   . Heart palpitations    PCP:  [name], MD Pharmacy:   [facility] [phone] - [place],  - [address] [place] [address] Phone:  [phone] Fax: [phone]  [facility] ORDER) [facility] - [place], [place] - [address] [place] [address] Phone: [phone] Fax: [phone]     Social Determinants of Health (SDOH) Interventions    Readmission Risk Interventions No flowsheet data found.

## 2020-12-07 NOTE — Progress Notes (Signed)
Orthopedic Tech Progress Note Patient Details:  Cassandra [name] July 22, 1933 [id number]  Ortho Devices Type of Ortho Device: Postop shoe/boot Ortho Device/Splint Location: RLE Ortho Device/Splint Interventions: Ordered,Application,Adjustment   Post Interventions Patient Tolerated: Well Instructions Provided: Care of device   Donald Pore 12/07/2020, 11:45 AM

## 2020-12-07 NOTE — Progress Notes (Signed)
  PATIENT ID: Cassandra [name]  MRN: [id number]  DOB/AGE:  85-Nov-1934 / 85 y.o.  1 Day Post-Op Procedure(s) (LRB): INCISION AND DRAINAGE OF RIGHT FOOT WITH HALLUX ARTHROTOMY, MEDIAL SESAMOIECTOMY (Right)  Subjective: No c/o chest pain or SOB.    No complaints of pain. Patient states that she thinks she may have a yeast infection.Patient is excited about being discharged to home today.   Objective: Vital signs in last 24 hours: Temp:  [97.5 F (36.4 C)-98.5 F (36.9 C)] 97.9 F (36.6 C) (01/05 0457) Pulse Rate:  [51-66] 55 (01/05 0457) Resp:  [11-18] 15 (01/05 0457) BP: (112-147)/(51-98) 112/54 (01/05 0457) SpO2:  [92 %-100 %] 95 % (01/05 0457) Weight:  [78 kg] 78 kg (01/04 1340)   Toes are warm and light touch intact. No additionally signs of infection about the margins of the bandage which is saturated with blood. Calf is soft and pliable with no signs of DVT.   Intake/Output from previous day: 01/04 0701 - 01/05 0700 In: 1290 [P.O.:440; I.V.:800; IV Piggyback:50] Out: 5 [Blood:5] Intake/Output this shift: No intake/output data recorded.   Assessment/Plan: 1 Day Post-Op Procedure(s) (LRB): INCISION AND DRAINAGE OF RIGHT FOOT WITH HALLUX ARTHROTOMY, MEDIAL SESAMOIECTOMY (Right)    VTE prophylaxis:Patient is on Eliquis at baseline and may resume.  We will follow up on microbiology results and adjust in the buttocks as needed.  She will follow up with me in 1 week's time for wound check.  She is heel weightbearing on the operative foot with postoperative shoe. Leave dressing in place until follow-up once changed prior to discharge.  Patient will be discharged to home today with oral antibiotics.   We have asked for her bandages to be changed prior to discharge. We have recommended that she follow up with her primary physician for signs and symptoms of yeast infection or try OTC remedies.     Danielle Rankin, OPA-C     [facility] [address] [place], [place]  [address] Office: [phone]  12/07/2020, 9:01 AM

## 2020-12-07 NOTE — Evaluation (Signed)
Physical Therapy Evaluation Patient Details Name: Cassandra [name] MRN: [id number] DOB: 1933/03/03 Today's Date: 12/07/2020   History of Present Illness  Patient is an 87 y/o female with diagnosis of R foot infection. Patient s/p I&D R foot, sesamoidectomy, R 1st MTP arthrotomy and synovectomy on 1/4. PMH, includes chronic kidney disease, GERD, HTN, HLD, PFO, hx of TIAs.  Clinical Impression  PTA, patient required assistance for bathing and ambulated with RW. Patient presents with deficits in strength, balance, endurance, and safety awareness. Patient unsteady with OOB mobility and requires cues/assistance for RW management with ambulation. Patient unable to maintain WB restrictions throughout session with cueing. Patient will benefit from skilled PT services to address listed deficits. Recommend SNF and 24/7 supervision/assistance following discharge to maximize functional mobility and safety. If patient/family declines, recommend HHPT at discharge.    Follow Up Recommendations SNF;Supervision/Assistance - 24 hour (family will more than likely decline SNF, recommend HHPT if that's the case)    Equipment Recommendations  Rolling Daiel Strohecker with 5" wheels    Recommendations for Other Services       Precautions / Restrictions Precautions Precautions: Fall Precaution Comments: WB through heel Restrictions Weight Bearing Restrictions: Yes RLE Weight Bearing:  (WB through heel)      Mobility  Bed Mobility Overal bed mobility: Needs Assistance Bed Mobility: Supine to Sit     Supine to sit: Supervision          Transfers Overall transfer level: Needs assistance Equipment used: Rolling Cheikh Bramble (2 wheeled) Transfers: Sit to/from Stand Sit to Stand: Min guard         General transfer comment: min guard for safety. Cues for sequencing and WB precautions, unable to maintain WB restrictions with t/f  Ambulation/Gait Ambulation/Gait assistance: Min assist Gait Distance (Feet): 10  Feet Assistive device: Rolling Yael Coppess (2 wheeled) Gait Pattern/deviations: Step-to pattern;Decreased stride length     General Gait Details: verbal/tactile cues required for RW management and maintaining WB restrictions during gait. Unable to maintain WB restrictions with cueing  Stairs            Wheelchair Mobility    Modified Rankin (Stroke Patients Only)       Balance Overall balance assessment: Needs assistance Sitting-balance support: Bilateral upper extremity supported;Feet supported Sitting balance-Leahy Scale: Fair     Standing balance support: Bilateral upper extremity supported;During functional activity Standing balance-Leahy Scale: Poor Standing balance comment: reliant on external support                             Pertinent Vitals/Pain Pain Assessment: Faces Faces Pain Scale: No hurt    Home Living Family/patient expects to be discharged to:: Private residence Living Arrangements: Spouse/significant other Available Help at Discharge: Family;Available 24 hours/day Type of Home: House Home Access: Stairs to enter Entrance Stairs-Rails: Doctor, general practice of Steps: 6 Home Layout: One level Home Equipment: Nakeya Adinolfi - 4 wheels;Bedside commode;Tub bench (lift chair) Additional Comments: difficulty obtaining hx from patient with repeating herself and changing responses throughout conversation.    Prior Function Level of Independence: Needs assistance   Gait / Transfers Assistance Needed: ambulates with RW  ADL's / Homemaking Assistance Needed: daughter assists with bathing (in/out of tub)        Hand Dominance        Extremity/Trunk Assessment   Upper Extremity Assessment Upper Extremity Assessment: Generalized weakness    Lower Extremity Assessment Lower Extremity Assessment: Generalized weakness  Communication      Cognition Arousal/Alertness: Awake/alert Behavior During Therapy: Impulsive Overall  Cognitive Status: No family/caregiver present to determine baseline cognitive functioning Area of Impairment: Attention;Memory;Following commands;Safety/judgement;Awareness;Problem solving                   Current Attention Level: Selective Memory: Decreased short-term memory;Decreased recall of precautions Following Commands: Follows one step commands inconsistently Safety/Judgement: Decreased awareness of safety;Decreased awareness of deficits Awareness: Emergent Problem Solving: Slow processing;Difficulty sequencing;Requires verbal cues;Requires tactile cues        General Comments General comments (skin integrity, edema, etc.): Discussed with daughter about recommendation for SNF, however daughter adamently against SNF and prefers HHPT at discharge    Exercises     Assessment/Plan    PT Assessment Patient needs continued PT services  PT Problem List Decreased strength;Decreased activity tolerance;Decreased balance;Decreased mobility;Decreased safety awareness       PT Treatment Interventions Gait training;DME instruction;Functional mobility training;Stair training;Therapeutic activities;Therapeutic exercise;Balance training;Patient/family education    PT Goals (Current goals can be found in the Care Plan section)  Acute Rehab PT Goals Patient Stated Goal: to go home PT Goal Formulation: With patient Time For Goal Achievement: 12/21/20 Potential to Achieve Goals: Fair    Frequency Min 2X/week   Barriers to discharge        Co-evaluation               AM-PAC PT "6 Clicks" Mobility  Outcome Measure Help needed turning from your back to your side while in a flat bed without using bedrails?: A Little Help needed moving from lying on your back to sitting on the side of a flat bed without using bedrails?: A Little Help needed moving to and from a bed to a chair (including a wheelchair)?: A Little Help needed standing up from a chair using your arms (e.g.,  wheelchair or bedside chair)?: A Little Help needed to walk in hospital room?: A Little Help needed climbing 3-5 steps with a railing? : A Lot 6 Click Score: 17    End of Session Equipment Utilized During Treatment: Gait belt Activity Tolerance: Patient tolerated treatment well Patient left: in bed;with nursing/sitter in room (sitting EOB with RN for dressing change) Nurse Communication: Mobility status PT Visit Diagnosis: Unsteadiness on feet (R26.81);Muscle weakness (generalized) (M62.81);Other abnormalities of gait and mobility (R26.89)    Time: 4696-2952 PT Time Calculation (min) (ACUTE ONLY): 37 min   Charges:   PT Evaluation $PT Eval Moderate Complexity: 1 Mod          Elzie Knisley A. Dan Humphreys PT, DPT Acute Rehabilitation Services Pager [phone] Office [phone]   Zannie Kehr Allred 12/07/2020, 10:40 AM

## 2020-12-07 NOTE — Discharge Instructions (Signed)

## 2020-12-07 NOTE — Plan of Care (Signed)

## 2020-12-09 DIAGNOSIS — F32A Depression, unspecified: Secondary | ICD-10-CM | POA: Diagnosis not present

## 2020-12-09 DIAGNOSIS — K449 Diaphragmatic hernia without obstruction or gangrene: Secondary | ICD-10-CM | POA: Diagnosis not present

## 2020-12-09 DIAGNOSIS — M89371 Hypertrophy of bone, right ankle and foot: Secondary | ICD-10-CM | POA: Diagnosis not present

## 2020-12-09 DIAGNOSIS — Z8673 Personal history of transient ischemic attack (TIA), and cerebral infarction without residual deficits: Secondary | ICD-10-CM | POA: Diagnosis not present

## 2020-12-09 DIAGNOSIS — K219 Gastro-esophageal reflux disease without esophagitis: Secondary | ICD-10-CM | POA: Diagnosis not present

## 2020-12-09 DIAGNOSIS — Z7901 Long term (current) use of anticoagulants: Secondary | ICD-10-CM | POA: Diagnosis not present

## 2020-12-09 DIAGNOSIS — L02611 Cutaneous abscess of right foot: Secondary | ICD-10-CM | POA: Diagnosis not present

## 2020-12-09 DIAGNOSIS — M1990 Unspecified osteoarthritis, unspecified site: Secondary | ICD-10-CM | POA: Diagnosis not present

## 2020-12-09 DIAGNOSIS — B951 Streptococcus, group B, as the cause of diseases classified elsewhere: Secondary | ICD-10-CM | POA: Diagnosis not present

## 2020-12-09 DIAGNOSIS — N1832 Chronic kidney disease, stage 3b: Secondary | ICD-10-CM | POA: Diagnosis not present

## 2020-12-09 DIAGNOSIS — Q211 Atrial septal defect: Secondary | ICD-10-CM | POA: Diagnosis not present

## 2020-12-09 DIAGNOSIS — E785 Hyperlipidemia, unspecified: Secondary | ICD-10-CM | POA: Diagnosis not present

## 2020-12-09 DIAGNOSIS — I129 Hypertensive chronic kidney disease with stage 1 through stage 4 chronic kidney disease, or unspecified chronic kidney disease: Secondary | ICD-10-CM | POA: Diagnosis not present

## 2020-12-09 DIAGNOSIS — Z79899 Other long term (current) drug therapy: Secondary | ICD-10-CM | POA: Diagnosis not present

## 2020-12-09 DIAGNOSIS — Z86718 Personal history of other venous thrombosis and embolism: Secondary | ICD-10-CM | POA: Diagnosis not present

## 2020-12-09 DIAGNOSIS — M00271 Other streptococcal arthritis, right ankle and foot: Secondary | ICD-10-CM | POA: Diagnosis not present

## 2020-12-11 LAB — AEROBIC/ANAEROBIC CULTURE W GRAM STAIN (SURGICAL/DEEP WOUND): Gram Stain: NONE SEEN

## 2020-12-12 LAB — AEROBIC/ANAEROBIC CULTURE W GRAM STAIN (SURGICAL/DEEP WOUND)
Culture: NO GROWTH
Culture: NO GROWTH

## 2020-12-14 DIAGNOSIS — E870 Hyperosmolality and hypernatremia: Secondary | ICD-10-CM | POA: Diagnosis not present

## 2020-12-14 DIAGNOSIS — R809 Proteinuria, unspecified: Secondary | ICD-10-CM | POA: Diagnosis not present

## 2020-12-14 DIAGNOSIS — I5032 Chronic diastolic (congestive) heart failure: Secondary | ICD-10-CM | POA: Diagnosis not present

## 2020-12-14 DIAGNOSIS — N1832 Chronic kidney disease, stage 3b: Secondary | ICD-10-CM | POA: Diagnosis not present

## 2020-12-23 DIAGNOSIS — R809 Proteinuria, unspecified: Secondary | ICD-10-CM | POA: Diagnosis not present

## 2020-12-23 DIAGNOSIS — N1832 Chronic kidney disease, stage 3b: Secondary | ICD-10-CM | POA: Diagnosis not present

## 2020-12-23 DIAGNOSIS — I5032 Chronic diastolic (congestive) heart failure: Secondary | ICD-10-CM | POA: Diagnosis not present

## 2020-12-23 DIAGNOSIS — E870 Hyperosmolality and hypernatremia: Secondary | ICD-10-CM | POA: Diagnosis not present

## 2020-12-31 DIAGNOSIS — E782 Mixed hyperlipidemia: Secondary | ICD-10-CM | POA: Diagnosis not present

## 2020-12-31 DIAGNOSIS — I10 Essential (primary) hypertension: Secondary | ICD-10-CM | POA: Diagnosis not present

## 2020-12-31 DIAGNOSIS — M1991 Primary osteoarthritis, unspecified site: Secondary | ICD-10-CM | POA: Diagnosis not present

## 2021-01-11 DIAGNOSIS — N342 Other urethritis: Secondary | ICD-10-CM | POA: Diagnosis not present

## 2021-01-11 DIAGNOSIS — E663 Overweight: Secondary | ICD-10-CM | POA: Diagnosis not present

## 2021-01-11 DIAGNOSIS — R410 Disorientation, unspecified: Secondary | ICD-10-CM | POA: Diagnosis not present

## 2021-01-11 DIAGNOSIS — Z6825 Body mass index (BMI) 25.0-25.9, adult: Secondary | ICD-10-CM | POA: Diagnosis not present

## 2021-02-07 DIAGNOSIS — Z1389 Encounter for screening for other disorder: Secondary | ICD-10-CM | POA: Diagnosis not present

## 2021-02-07 DIAGNOSIS — Z0000 Encounter for general adult medical examination without abnormal findings: Secondary | ICD-10-CM | POA: Diagnosis not present

## 2021-02-07 DIAGNOSIS — Z6825 Body mass index (BMI) 25.0-25.9, adult: Secondary | ICD-10-CM | POA: Diagnosis not present

## 2021-02-07 DIAGNOSIS — R7309 Other abnormal glucose: Secondary | ICD-10-CM | POA: Diagnosis not present

## 2021-02-07 DIAGNOSIS — I330 Acute and subacute infective endocarditis: Secondary | ICD-10-CM | POA: Diagnosis not present

## 2021-02-07 DIAGNOSIS — I10 Essential (primary) hypertension: Secondary | ICD-10-CM | POA: Diagnosis not present

## 2021-02-07 DIAGNOSIS — I872 Venous insufficiency (chronic) (peripheral): Secondary | ICD-10-CM | POA: Diagnosis not present

## 2021-03-01 DIAGNOSIS — I10 Essential (primary) hypertension: Secondary | ICD-10-CM | POA: Diagnosis not present

## 2021-03-01 DIAGNOSIS — E782 Mixed hyperlipidemia: Secondary | ICD-10-CM | POA: Diagnosis not present

## 2021-03-10 DIAGNOSIS — H353131 Nonexudative age-related macular degeneration, bilateral, early dry stage: Secondary | ICD-10-CM | POA: Diagnosis not present

## 2021-03-10 DIAGNOSIS — H04123 Dry eye syndrome of bilateral lacrimal glands: Secondary | ICD-10-CM | POA: Diagnosis not present

## 2021-03-10 DIAGNOSIS — H401133 Primary open-angle glaucoma, bilateral, severe stage: Secondary | ICD-10-CM | POA: Diagnosis not present

## 2021-03-13 DIAGNOSIS — M79671 Pain in right foot: Secondary | ICD-10-CM | POA: Diagnosis not present

## 2021-03-22 DIAGNOSIS — N1832 Chronic kidney disease, stage 3b: Secondary | ICD-10-CM | POA: Diagnosis not present

## 2021-03-22 DIAGNOSIS — R809 Proteinuria, unspecified: Secondary | ICD-10-CM | POA: Diagnosis not present

## 2021-03-22 DIAGNOSIS — E870 Hyperosmolality and hypernatremia: Secondary | ICD-10-CM | POA: Diagnosis not present

## 2021-03-22 DIAGNOSIS — I5032 Chronic diastolic (congestive) heart failure: Secondary | ICD-10-CM | POA: Diagnosis not present

## 2021-03-24 DIAGNOSIS — N1832 Chronic kidney disease, stage 3b: Secondary | ICD-10-CM | POA: Diagnosis not present

## 2021-03-24 DIAGNOSIS — R809 Proteinuria, unspecified: Secondary | ICD-10-CM | POA: Diagnosis not present

## 2021-03-24 DIAGNOSIS — E870 Hyperosmolality and hypernatremia: Secondary | ICD-10-CM | POA: Diagnosis not present

## 2021-03-24 DIAGNOSIS — I129 Hypertensive chronic kidney disease with stage 1 through stage 4 chronic kidney disease, or unspecified chronic kidney disease: Secondary | ICD-10-CM | POA: Diagnosis not present

## 2021-04-07 DIAGNOSIS — I10 Essential (primary) hypertension: Secondary | ICD-10-CM | POA: Diagnosis not present

## 2021-04-07 DIAGNOSIS — Z6825 Body mass index (BMI) 25.0-25.9, adult: Secondary | ICD-10-CM | POA: Diagnosis not present

## 2021-04-07 DIAGNOSIS — N342 Other urethritis: Secondary | ICD-10-CM | POA: Diagnosis not present

## 2021-04-07 DIAGNOSIS — N390 Urinary tract infection, site not specified: Secondary | ICD-10-CM | POA: Diagnosis not present

## 2021-04-24 DIAGNOSIS — R413 Other amnesia: Secondary | ICD-10-CM | POA: Diagnosis not present

## 2021-04-24 DIAGNOSIS — E663 Overweight: Secondary | ICD-10-CM | POA: Diagnosis not present

## 2021-04-24 DIAGNOSIS — N342 Other urethritis: Secondary | ICD-10-CM | POA: Diagnosis not present

## 2021-04-24 DIAGNOSIS — Z6825 Body mass index (BMI) 25.0-25.9, adult: Secondary | ICD-10-CM | POA: Diagnosis not present

## 2021-05-02 DIAGNOSIS — I10 Essential (primary) hypertension: Secondary | ICD-10-CM | POA: Diagnosis not present

## 2021-05-02 DIAGNOSIS — E7849 Other hyperlipidemia: Secondary | ICD-10-CM | POA: Diagnosis not present

## 2021-05-04 DIAGNOSIS — N390 Urinary tract infection, site not specified: Secondary | ICD-10-CM | POA: Diagnosis not present

## 2021-05-22 DIAGNOSIS — Z6825 Body mass index (BMI) 25.0-25.9, adult: Secondary | ICD-10-CM | POA: Diagnosis not present

## 2021-05-22 DIAGNOSIS — E663 Overweight: Secondary | ICD-10-CM | POA: Diagnosis not present

## 2021-05-22 DIAGNOSIS — M1711 Unilateral primary osteoarthritis, right knee: Secondary | ICD-10-CM | POA: Diagnosis not present

## 2021-05-22 DIAGNOSIS — R413 Other amnesia: Secondary | ICD-10-CM | POA: Diagnosis not present

## 2021-05-24 ENCOUNTER — Encounter: Payer: Self-pay | Admitting: Physician Assistant

## 2021-05-31 ENCOUNTER — Ambulatory Visit: Payer: Medicare Other | Admitting: Physician Assistant

## 2021-05-31 ENCOUNTER — Other Ambulatory Visit: Payer: Self-pay

## 2021-05-31 ENCOUNTER — Other Ambulatory Visit (INDEPENDENT_AMBULATORY_CARE_PROVIDER_SITE_OTHER): Payer: Medicare Other

## 2021-05-31 ENCOUNTER — Encounter: Payer: Self-pay | Admitting: Physician Assistant

## 2021-05-31 VITALS — BP 177/78 | HR 57

## 2021-05-31 DIAGNOSIS — R413 Other amnesia: Secondary | ICD-10-CM

## 2021-05-31 DIAGNOSIS — F0390 Unspecified dementia without behavioral disturbance: Secondary | ICD-10-CM

## 2021-05-31 LAB — TSH: TSH: 2.01 u[IU]/mL (ref 0.35–4.50)

## 2021-05-31 LAB — VITAMIN B12: Vitamin B-12: 133 pg/mL — ABNORMAL LOW (ref 211–911)

## 2021-05-31 MED ORDER — MEMANTINE HCL 10 MG PO TABS: ORAL_TABLET | ORAL | 11 refills | Status: DC

## 2021-05-31 NOTE — Progress Notes (Addendum)
Assessment/Plan:   Cassandra [name] is an 87 y.o. year old female with risk factors including age, hypertension, hyperlipidemia, anemia of chronic disease, Vit D deficiency, CKD stage 3b, CAD, depression, stroke in 2017 and prior PE on [facility], seen today for evaluation of memory loss. MoCA today is 6/30 highly suspicious for dementia, with deficiencies in visuospatial/executive, unable to draw a clock, or copy a cube, recall, delayed recall, attention, abstraction and orientation.     Recommendations are as follows:   Dementia with behavioral disturbance, possibly of vascular etiology, versus mixed disease   MRI of the brain to evaluate for bleeding, brain size and other abnormalities Check B12, TSH Due to bradycardia, would avoid Donepezil. Start Memantine 10mg  tablets. Take 1/2 tablet at bedtime for 2 weeks, then 1 tablet twice daily.   Side effects include dizziness, headache, diarrhea or constipation. Call with any questions or concerns.  Discussed safety both in and out of the home.  Discussed the importance of regular daily schedule to maintain brain function.  Continue to monitor mood with PCP.  Stay active as tolerated    Naps should be scheduled and should be no longer than 60 minutes and should not occur after 2 PM.  Folllow up once results above are available   Subjective:    The patient is seen in neurologic consultation at the request of , MD for the evaluation of memory.  The patient is accompanied by daughter Cassandra [name] who supplements the history.  The patient is a 87 y.o. year old female who has had memory issues for about  one year per daughter's report when she began seeing people, mainly women, standing at the door.  About 4 months ago, the patient had a kidney infection accompanied by acute mental status changes, treated with antibiotics with  improvement of her symptoms.  About 3 weeks ago, "her confusion came back ".  She began forgetting names and phone  numbers that she knew by heart.  Her daughter, who lives next door, reported that her mother was beating her own house, "wanted to go home", not recognizing her own environment.  She has been more irritable, has "become meaner, especially over the last 2 weeks.  This is new, since my mother used to be so easygoing "-daughter says. She sleeps "okay", wakes up 1-2 times at night and has very vivid dreams, talking in her sleep according to her husband.  No apparent sleepwalking.  Her mirror had to be covered because she confuses it with another room and wants to walk towards it. She is independent of bathing and her daughter helps her to dress up.  85 has taken over the medications, because she began to forget to take them especially over the last 3 weeks and the finances 3 months ago.  Her appetite is poor at least for 1 week, with failure to thrive.  No trouble swallowing.  She cooks "some" with her husband's supervision.  He denies having left accidentally the stove or the faucet.  She does leave objects in unusual places.  Recently, she placed all the empty Tupperware in the refrigerator.  She ambulates with assistance, especially "since she is due for another knee injection ".  The patient has significant arthritis, and has mobility issues.  Yesterday, she had a mechanical fall, and hit her head in the right occipital area without LOC.  She does admit to falling often especially over the last 4 months. She does not drive, "angry about it"-daughter says.  She denies any history of headaches, double vision, dizziness, focal numbness or tingling, unilateral weakness or tremors.  Denies urine incontinence or retention.  Denies constipation or diarrhea.  Denies anosmia.  Denies any history of OSA, alcohol or tobacco.  Family history remarkable for mother who had dementia    MRI brain 01/04/16 for stroke workup 1. 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 Left MCA,  bilateral PCA, and SCA territories are affected. Therefore, consider recent embolic event from the heart or proximal aorta (less likely posterior circulation only thromboembolism). 2. No associated hemorrhage or mass effect. 3. Otherwise no significant change in the MRI appearance of the brain since 2013.      Allergies  Allergen Reactions   Codeine Shortness Of Breath and Nausea And Vomiting   Hydrocodone Shortness Of Breath and Nausea And Vomiting    Current Outpatient Medications  Medication Instructions   apixaban (ELIQUIS) 10 mg, Oral, 2 times daily   calcium carbonate (OS-CAL - DOSED IN MG OF ELEMENTAL CALCIUM) 1250 (500 Ca) MG tablet 2 tablets, Oral, Daily with breakfast   cholecalciferol (VITAMIN D3) 1,000 Units, Oral, Daily   famotidine (PEPCID) 20 mg, Oral, Daily   furosemide (LASIX) 40 mg, Oral, Daily   iron polysaccharides (NIFEREX) 150 mg, Oral, Daily   memantine (NAMENDA) 10 MG tablet Take 1 tablet (10 mg at night) for 2 weeks, then increase to 1 tablet (10 mg) twice a day   metoprolol succinate (TOPROL-XL) 50 mg, Oral, Daily   Multiple Vitamins-Minerals (ICAPS PO) 1 capsule, Oral, Daily   ROCKLATAN 0.02-0.005 % SOLN 1 drop, Both Eyes, Daily at bedtime   sertraline (ZOLOFT) 100 mg, Daily     VITALS:   Vitals:   05/31/21 0929  BP: (!) 177/78  Pulse: (!) 57  SpO2: 98%   Depression screen [facility] 2/9 04/05/2016 02/08/2016  Decreased Interest 0 0  Down, Depressed, Hopeless 0 0  PHQ - 2 Score 0 0    HEENT:  Normocephalic, small bruise on the R temporo-occipital area  The mucous membranes are moist. The superficial temporal arteries are without ropiness or tenderness. Cardiovascular: Regular rate and rhythm. Lungs: Clear to auscultation bilaterally. Neck: There are no carotid bruits noted bilaterally.  NEUROLOGICAL:  Orientation:   Montreal Cognitive Assessment  05/31/2021  Visuospatial/ Executive (0/5) 1  Naming (0/3) 3  Attention: Read list of digits (0/2) 0   Attention: Read list of letters (0/1) 0  Attention: Serial 7 subtraction starting at 100 (0/3) 0  Language: Repeat phrase (0/2) 0  Language : Fluency (0/1) 0  Abstraction (0/2) 0  Delayed Recall (0/5) 0  Orientation (0/6) 1  Total 5  Adjusted Score (based on education) 6   Alert and oriented to person, place but not to time. No aphasia or dysarthria. Fund of knowledge is appropriate. Recent and remote memory are impaired. Attention and concentration are reduced.  Able to name objects and unable to repeat phrases. Delayed recall 0/5.  Unable to tell date, "July 2008", Monday.  Cranial nerves: There is good facial symmetry. Extraocular muscles are intact and visual fields are full to confrontational testing. Speech is fluent and clear. Soft palate rises symmetrically and there is no tongue deviation. Hearing is intact to conversational tone. Tone: Tone is good throughout. Sensation: Sensation is intact to light touch and pinprick throughout. Vibration is intact at the R big toe, L TM amputation .There is no extinction with double simultaneous stimulation. There is no sensory dermatomal level  identified. Coordination: The patient has no difficulty with RAM's. Difficulty with FNF bilaterally. Normal finger to nose  Motor: Strength is 5/5 in the bilateral upper and lower extremities. There is no pronator drift. There are no fasciculations noted. DTR's: Deep tendon reflexes are 2/4 at the bilateral biceps, triceps, brachioradialis, patella and achilles.  Plantar responses are downgoing bilaterally. Gait and Station: The patient is able to ambulate but with difficulty.The patient is unable to heel toe walk tandem fashion. The patient is unable to stand in the Romberg position.   CBC Latest Ref Rng & Units 12/06/2020 01/08/2018 04/05/2016  WBC 4.0 - 10.5 K/uL 11.6(H) 6.2 7.2  Hemoglobin 12.0 - 15.0 g/dL 66.4 40.3 11.2(L)  Hematocrit 36.0 - 46.0 % 42.9 35.3 34.5(L)  Platelets 150 - 400 K/uL 268 192 307      CMP Latest Ref Rng & Units 12/06/2020 04/05/2016 02/15/2016  Glucose 70 - 99 mg/dL 90 474(Q) 95  BUN 8 - 23 mg/dL 21 22 6   Creatinine 0.44 - 1.00 mg/dL ) 5.95(G) 3.87(F  Sodium 135 - 145 mmol/L 139 136 135  Potassium 3.5 - 5.1 mmol/L 3.7 4.9 3.4(L)  Chloride 98 - 111 mmol/L 100 100 97(L)  CO2 22 - 32 mmol/L 25 26 28   Calcium 8.9 - 10.3 mg/dL 9.6 9.8 6.43)  Total Protein 6.5 - 8.1 g/dL - - 6.1(L)  Total Bilirubin 0.3 - 1.2 mg/dL - - 1.8(H)  Alkaline Phos 38 - 126 U/L - - 78  AST 15 - 41 U/L - - 20  ALT 14 - 54 U/L - - 11(L)      Thank you for allowing the opportunity to participate in the care of this nice patient. Please do not hesitate to contact 3.2(R for any questions or concerns.   Total time spent on today's visit was 60 minutes, including both face-to-face time and nonface-to-face time.  Time included that spent on review of records (prior notes available to me/labs/imaging if pertinent), discussing treatment and goals, answering patient's questions and coordinating care.  Cc:  [place], MD  [place] 05/31/2021 1:04 PM

## 2021-05-31 NOTE — Patient Instructions (Addendum)
It was a pleasure to see you today at our office.   Recommendations:  Follow up 3 months MRI of the brain, the office will call you to arrange you appointment Check B12 and TSH at the lab Start Memantine 10mg  tablets. Take 1/2 tablet at bedtime for 2 weeks, then 1 tablet twice daily.   Side effects include dizziness, headache, diarrhea or constipation.  Call with any questions or concerns.   RECOMMENDATIONS FOR ALL PATIENTS WITH MEMORY PROBLEMS: 1. Continue to exercise (Recommend 30 minutes of walking everyday, or 3 hours every week) 2. Increase social interactions - continue going to [place] and enjoy social gatherings with friends and family 3. Eat healthy, avoid fried foods and eat more fruits and vegetables 4. Maintain adequate blood pressure, blood sugar, and blood cholesterol level. Reducing the risk of stroke and cardiovascular disease also helps promoting better memory. 5. Avoid stressful situations. Live a simple life and avoid aggravations. Organize your time and prepare for the next day in anticipation. 6. Sleep well, avoid any interruptions of sleep and avoid any distractions in the bedroom that may interfere with adequate sleep quality 7. Avoid sugar, avoid sweets as there is a strong link between excessive sugar intake, diabetes, and cognitive impairment We discussed the Mediterranean diet, which has been shown to help patients reduce the risk of progressive memory disorders and reduces cardiovascular risk. This includes eating fish, eat fruits and green leafy vegetables, nuts like almonds and hazelnuts, walnuts, and also use olive oil. Avoid fast foods and fried foods as much as possible. Avoid sweets and sugar as sugar use has been linked to worsening of memory function.  There is always a concern of gradual progression of memory problems. If this is the case, then we may need to adjust level of care according to patient needs. Support, both to the patient and caregiver, should then  be put into place.      You have been referred for a neuropsychological evaluation (i.e., evaluation of memory and thinking abilities). Please bring someone with you to this appointment if possible, as it is helpful for the doctor to hear from both you and another adult who knows you well. Please bring eyeglasses and hearing aids if you wear them.    The evaluation will take approximately 3 hours and has two parts:   The first part is a clinical interview with the neuropsychologist (Dr. Caspe or Dr. Milbert Coulter). During the interview, the neuropsychologist will speak with you and the individual you brought to the appointment.    The second part of the evaluation is testing with the doctor's technician [name] or Annabelle Harman). During the testing, the technician will ask you to remember different types of material, solve problems, and answer some questionnaires. Your family member will not be present for this portion of the evaluation.   Please note: We must reserve several hours of the neuropsychologist's time and the psychometrician's time for your evaluation appointment. As such, there is a No-Show fee of $100. If you are unable to attend any of your appointments, please contact our office as soon as possible to reschedule.    FALL PRECAUTIONS: Be cautious when walking. Scan the area for obstacles that may increase the risk of trips and falls. When getting up in the mornings, sit up at the edge of the bed for a few minutes before getting out of bed. Consider elevating the bed at the head end to avoid drop of blood pressure when getting up. Walk always in  a well-lit room (use night lights in the walls). Avoid area rugs or power cords from appliances in the middle of the walkways. Use a walker or a cane if necessary and consider physical therapy for balance exercise. Get your eyesight checked regularly.  FINANCIAL OVERSIGHT: Supervision, especially oversight when making financial decisions or transactions is also  recommended.  HOME SAFETY: Consider the safety of the kitchen when operating appliances like stoves, microwave oven, and blender. Consider having supervision and share cooking responsibilities until no longer able to participate in those. Accidents with firearms and other hazards in the house should be identified and addressed as well.   ABILITY TO BE LEFT ALONE: If patient is unable to contact 911 operator, consider using LifeLine, or when the need is there, arrange for someone to stay with patients. Smoking is a fire hazard, consider supervision or cessation. Risk of wandering should be assessed by caregiver and if detected at any point, supervision and safe proof recommendations should be instituted.  MEDICATION SUPERVISION: Inability to self-administer medication needs to be constantly addressed. Implement a mechanism to ensure safe administration of the medications.   DRIVING: Regarding driving, in patients with progressive memory problems, driving will be impaired. We advise to have someone else do the driving if trouble finding directions or if minor accidents are reported. Independent driving assessment is available to determine safety of driving.   If you are interested in the driving assessment, you can contact the following:  [employer] in [place] [phone]  [facility] [phone]  [facility] [phone] or [phone]    Mediterranean Diet A Mediterranean diet refers to food and lifestyle choices that are based on the traditions of countries located on the [employer]. This way of eating has been shown to help prevent certain conditions and improve outcomes for people who have chronic diseases, like kidney disease and heart disease. What are tips for following this plan? Lifestyle  Cook and eat meals together with your family, when possible. Drink enough fluid to keep your urine clear  or pale yellow. Be physically active every day. This includes: Aerobic exercise like running or swimming. Leisure activities like gardening, walking, or housework. Get 7-8 hours of sleep each night. If recommended by your health care provider, drink red wine in moderation. This means 1 glass a day for nonpregnant women and 2 glasses a day for men. A glass of wine equals 5 oz (150 mL). Reading food labels  Check the serving size of packaged foods. For foods such as rice and pasta, the serving size refers to the amount of cooked product, not dry. Check the total fat in packaged foods. Avoid foods that have saturated fat or trans fats. Check the ingredients list for added sugars, such as corn syrup. Shopping  At the grocery store, buy most of your food from the areas near the walls of the store. This includes: Fresh fruits and vegetables (produce). Grains, beans, nuts, and seeds. Some of these may be available in unpackaged forms or large amounts (in bulk). Fresh seafood. Poultry and eggs. Low-fat dairy products. Buy whole ingredients instead of prepackaged foods. Buy fresh fruits and vegetables in-season from local farmers markets. Buy frozen fruits and vegetables in resealable bags. If you do not have access to quality fresh seafood, buy precooked frozen shrimp or canned fish, such as tuna, salmon, or sardines. Buy small amounts of raw or cooked vegetables, salads, or olives from the deli or salad bar  at your store. Stock your pantry so you always have certain foods on hand, such as olive oil, canned tuna, canned tomatoes, rice, pasta, and beans. Cooking  Cook foods with extra-virgin olive oil instead of using butter or other vegetable oils. Have meat as a side dish, and have vegetables or grains as your main dish. This means having meat in small portions or adding small amounts of meat to foods like pasta or stew. Use beans or vegetables instead of meat in common dishes like chili or  lasagna. Experiment with different cooking methods. Try roasting or broiling vegetables instead of steaming or sauteing them. Add frozen vegetables to soups, stews, pasta, or rice. Add nuts or seeds for added healthy fat at each meal. You can add these to yogurt, salads, or vegetable dishes. Marinate fish or vegetables using olive oil, lemon juice, garlic, and fresh herbs. Meal planning  Plan to eat 1 vegetarian meal one day each week. Try to work up to 2 vegetarian meals, if possible. Eat seafood 2 or more times a week. Have healthy snacks readily available, such as: Vegetable sticks with hummus. Greek yogurt. Fruit and nut trail mix. Eat balanced meals throughout the week. This includes: Fruit: 2-3 servings a day Vegetables: 4-5 servings a day Low-fat dairy: 2 servings a day Fish, poultry, or lean meat: 1 serving a day Beans and legumes: 2 or more servings a week Nuts and seeds: 1-2 servings a day Whole grains: 6-8 servings a day Extra-virgin olive oil: 3-4 servings a day Limit red meat and sweets to only a few servings a month What are my food choices? Mediterranean diet Recommended Grains: Whole-grain pasta. Brown rice. Bulgar wheat. Polenta. Couscous. Whole-wheat bread. Orpah Cobb. Vegetables: Artichokes. Beets. Broccoli. Cabbage. Carrots. Eggplant. Green beans. Chard. Kale. Spinach. Onions. Leeks. Peas. Squash. Tomatoes. Peppers. Radishes. Fruits: Apples. Apricots. Avocado. Berries. Bananas. Cherries. Dates. Figs. Grapes. Lemons. Melon. Oranges. Peaches. Plums. Pomegranate. Meats and other protein foods: Beans. Almonds. Sunflower seeds. Pine nuts. Peanuts. Cod. Salmon. Scallops. Shrimp. Tuna. Tilapia. Clams. Oysters. Eggs. Dairy: Low-fat milk. Cheese. Greek yogurt. Beverages: Water. Red wine. Herbal tea. Fats and oils: Extra virgin olive oil. Avocado oil. Grape seed oil. Sweets and desserts: [place] yogurt with honey. Baked apples. Poached pears. Trail mix. Seasoning and  other foods: Basil. Cilantro. Coriander. Cumin. Mint. Parsley. Sage. Rosemary. Tarragon. Garlic. Oregano. Thyme. Pepper. Balsalmic vinegar. Tahini. Hummus. Tomato sauce. Olives. Mushrooms. Limit these Grains: Prepackaged pasta or rice dishes. Prepackaged cereal with added sugar. Vegetables: Deep fried potatoes (french fries). Fruits: Fruit canned in syrup. Meats and other protein foods: Beef. Pork. Lamb. Poultry with skin. Hot dogs. Tomasa Blase. Dairy: Ice cream. Sour cream. Whole milk. Beverages: Juice. Sugar-sweetened soft drinks. Beer. Liquor and spirits. Fats and oils: Butter. Canola oil. Vegetable oil. Beef fat (tallow). Lard. Sweets and desserts: Cookies. Cakes. Pies. Candy. Seasoning and other foods: Mayonnaise. Premade sauces and marinades. The items listed may not be a complete list. Talk with your dietitian about what dietary choices are right for you. Summary The Mediterranean diet includes both food and lifestyle choices. Eat a variety of fresh fruits and vegetables, beans, nuts, seeds, and whole grains. Limit the amount of red meat and sweets that you eat. Talk with your health care provider about whether it is safe for you to drink red wine in moderation. This means 1 glass a day for nonpregnant women and 2 glasses a day for men. A glass of wine equals 5 oz (150 mL). This information is not intended  to replace advice given to you by your health care provider. Make sure you discuss any questions you have with your health care provider. Document Released: 07/12/2016 Document Revised: 08/14/2016 Document Reviewed: 07/12/2016 Elsevier Interactive Patient Education  2017 [employer].

## 2021-06-01 ENCOUNTER — Telehealth: Payer: Self-pay | Admitting: Physician Assistant

## 2021-06-01 ENCOUNTER — Telehealth: Payer: Self-pay

## 2021-06-01 NOTE — Telephone Encounter (Signed)
Pt's daughter called back in. She stated she called [facility] and they told her they do not give B12 shots. She wants to see if our office could give her the shot? She also stated the patient had had a B12 shot several years ago and she got sick from it. They stopped giving them to her after that.

## 2021-06-01 NOTE — Telephone Encounter (Signed)
-----   Message from Marcos Eke, PA-C sent at 05/31/2021  2:16 PM EDT ----- If any chance please, let her know that  B12 is very low, will need to start replenishment at  B12  1000 mcg daily to maintain levels above 400 ( currently 133) TSH is normal at 2.01 Please follow up with your primary doctor

## 2021-06-01 NOTE — Telephone Encounter (Signed)
Pt daughter called back and advised that we do not want her to do the shot we wanted her to start taken tablets daily and that she can get them over the counter from [facility], [facility], ect. She stated that she would go and pick them up for her to start taken .

## 2021-06-01 NOTE — Telephone Encounter (Signed)
Pt called and informed B12 is very low, will need to start replenishment at  B12  1000 mcg daily tomaintain levels above 400, TSH is normal follow up with PCP

## 2021-06-07 ENCOUNTER — Other Ambulatory Visit: Payer: Self-pay | Admitting: Physician Assistant

## 2021-06-12 ENCOUNTER — Ambulatory Visit
Admission: RE | Admit: 2021-06-12 | Discharge: 2021-06-12 | Disposition: A | Discharge status: Home / Self Care | Payer: Medicare Other | Source: Ambulatory Visit | Attending: Physician Assistant | Admitting: Physician Assistant

## 2021-06-12 DIAGNOSIS — R413 Other amnesia: Secondary | ICD-10-CM

## 2021-06-15 ENCOUNTER — Telehealth: Payer: Self-pay | Admitting: Physician Assistant

## 2021-06-15 ENCOUNTER — Telehealth: Payer: Self-pay

## 2021-06-15 NOTE — Telephone Encounter (Signed)
Spoke with pt daughter MRI was reviewed, no change since 2017

## 2021-06-15 NOTE — Telephone Encounter (Signed)
Pt's daughter called in wanting to see if we have the patient's MRI results

## 2021-06-15 NOTE — Telephone Encounter (Signed)
-----   Message from Marcos Eke, PA-C sent at 06/13/2021  7:14 AM EDT -----  Please let patient's daughter know that MRI was reviewed, no change since 2017. The changes in the back part of her brain are the same since 2017, if any change in symptoms such as vertigo, headaches, let us know. Thanks

## 2021-06-15 NOTE — Telephone Encounter (Signed)
See result notes. 

## 2021-06-19 ENCOUNTER — Other Ambulatory Visit: Payer: Self-pay | Admitting: Physician Assistant

## 2021-06-19 ENCOUNTER — Other Ambulatory Visit: Payer: Self-pay | Admitting: Family Medicine

## 2021-06-19 ENCOUNTER — Telehealth: Payer: Self-pay | Admitting: Physician Assistant

## 2021-06-19 DIAGNOSIS — Z1231 Encounter for screening mammogram for malignant neoplasm of breast: Secondary | ICD-10-CM

## 2021-06-19 MED ORDER — QUETIAPINE FUMARATE 25 MG PO TABS: 12.5000 mg | ORAL_TABLET | Freq: Every day | ORAL | 3 refills | Status: DC

## 2021-06-19 NOTE — Telephone Encounter (Signed)
[name] daughter advised of side effects, noted cardia arrthmias risk of death, patient is not on Xanax and told Marlowe Kays PA-C noted this to her, verbally. Told her Rx would be at pharmacy for pick up. Thanked me for calling, continue all meds as prescribed per [place] Dec.

## 2021-06-19 NOTE — Progress Notes (Signed)
Prescription for Seroquel 25 mg ( half tablet =12.5 mg) po qhs written to Pharmacy. Risk for cardiac death has been discussed with patient and agreed to proceed

## 2021-06-19 NOTE — Telephone Encounter (Signed)
Can you rx anything or suggestions.

## 2021-06-19 NOTE — Telephone Encounter (Signed)
Pt daughter states her mother tends to get more confused in the evening. Would like to know if she needs something to sleep/ relax her. Would like a call back. [phone].

## 2021-08-11 ENCOUNTER — Ambulatory Visit
Admission: RE | Admit: 2021-08-11 | Discharge: 2021-08-11 | Disposition: A | Discharge status: Home / Self Care | Payer: Medicare HMO | Source: Ambulatory Visit | Attending: Family Medicine | Admitting: Family Medicine

## 2021-08-11 ENCOUNTER — Other Ambulatory Visit: Payer: Self-pay

## 2021-08-11 DIAGNOSIS — Z1231 Encounter for screening mammogram for malignant neoplasm of breast: Secondary | ICD-10-CM

## 2021-08-13 NOTE — Progress Notes (Signed)
Assessment/Plan:    Dementia with Behavioral Disturbance, likely mixed.    05/31/21 MoCA 6/20. MRI brain with age related changes and old moderate chronic microvascular ischemic changes. Small chronic right cerebellar and bilateral deep cerebral white matter infarcts and question  chronic  of dural defect with herniation of CSF and abnormal cerebellar parenchyma posterior to the right sigmoid sinus.  Labs remarkable for low B12, now on replenishment. Patient on Memantine 10 mg bid, tolerating well.    Recommendations:  Discussed safety both in and out of the home.  Discussed the importance of regular daily schedule to maintain brain function.  Continue to monitor mood with Zoloft per PCP  Stay active at least 30 minutes at least 3 times a week.  Recommend the use of a walker for mobilizing, for safety. Naps should be scheduled and should be no longer than 60 minutes and should not occur after 2 PM.  Continue Memantine, 1 tablet (10 mg) twice a day.  Side effects discussed refills sent to the pharmacy Continue B12 injections by PCP  Follow up in 6  months.   Case discussed with Dr. Karel JarvisAquino who agrees with the plan     Subjective:   ED visits since last seen: none  Hospital admissions: none  Cassandra [name] is a 87 y.o. 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 She was last seen on 05/31/21 with MoCA 6/20.  This patient is accompanied in the office by her daughter who supplements the history.  Previous records as well as any outside records available were reviewed prior to todays visit.   Since her last visit, the patient reports that her memory is about the same, "I continue to forget the phone numbers and the names like before ".  She began taking memantine 10 mg  twice daily, and she reports that since, she feels "a little more awake ".  Her hallucinations are less evident, and her daughter denies any issues with agitation.  Seroquel has been changed to Zoloft by her PCP, and her daughter states that since beginning taking these med, she appears calm her, "more pleasant", "and no longer meaner ".  Daughter states that she gets upset however when she wants to go home not realizing that she is actually at home.  She no longer has issues with the mirrors ( which she would walk into thinking it was another room), "I do not cover them anymore, now I know that I am in my room".  She sleeps "okay ", waking up 1-2 times a night, continues to have vivid dreams and talking in her sleep, but does not act out or does any sleepwalking. She gets some assistance with bathing and dressing by her daughter.  Cassandra Sineancy has taken over the medications, and finances, and the patient no longer drives or cooks.  Her appetite is good, without trouble swallowing.  She has not been leaving objects in unusual places. She ambulates with a cane, but has fallen a couple of times without LOC or head trauma," because of her left foot amputation causing her mild loss of balance".  She has been arguing with her daughter because she does not want to use the walker for safety.  In the past, she had physical therapy, "which did not help ". She  denies any  headaches, double vision, dizziness, focal numbness or tingling, unilateral weakness or tremors.  Denies urine incontinence or retention.  Denies constipation or diarrhea.  Denies anosmia.     INITIAL EVALUATION 05/31/21  The patient is seen in neurologic consultation at the request of [name], MD for the evaluation of memory.  The patient is accompanied by daughter Cassandra [name] who supplements the history.   The patient is a 87 y.o. year old female who has had memory issues for about  one year per daughter's report when she began seeing people, mainly  women, standing at the door.  About 4 months ago, the patient had a kidney infection accompanied by acute mental status changes, treated with antibiotics with  improvement of her symptoms.  About 3 weeks ago, "her confusion came back ".  She began forgetting names and phone numbers that she knew by heart.  Her daughter, who lives next door, reported that her mother was beating her own house, "wanted to go home", not recognizing her own environment.  She has been more irritable, has "become meaner, especially over the last 2 weeks.  This is new, since my mother used to be so easygoing "-daughter says. She sleeps "okay", wakes up 1-2 times at night and has very vivid dreams, talking in her sleep according to her husband.  No apparent sleepwalking.  Her mirror had to be covered because she confuses it with another room and wants to walk towards it. She is independent of bathing and her daughter helps her to dress up.  Cassandra [name] has taken over the medications, because she began to forget to take them especially over the last 3 weeks and the finances 3 months ago.  Her appetite is poor at least for 1 week, with failure to thrive.  No trouble swallowing.  She cooks "some" with her husband's supervision.  He denies having left accidentally the stove or the faucet.  She does leave objects in unusual places.  Recently, she placed all the empty Tupperware in the refrigerator.  She ambulates with assistance, especially "since she is due for another knee injection ".  The patient has significant arthritis, and has mobility issues.  Yesterday, she had a mechanical fall, and hit her head in the right occipital area without LOC.  She does admit to falling often especially over the last 4 months. She does not drive, "angry about it"-daughter says.   She denies any history of headaches, double vision, dizziness, focal numbness or tingling, unilateral weakness or tremors.  Denies urine incontinence or retention.  Denies constipation or  diarrhea.  Denies anosmia.  Denies any history of OSA, alcohol or tobacco.  Family history remarkable for mother who had dementia  MRI brain 06/12/21 No acute intracranial abnormality. Moderate chronic microvascular ischemic changes. Small chronic right cerebellar and bilateral deep cerebral white matter infarcts.   Question of dural defect with herniation of CSF and abnormal cerebellar parenchyma posterior to the right sigmoid sinus  (chronic finding).    MRI brain 01/04/16 for stroke workup 1. 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 Left MCA, bilateral PCA, and SCA territories are affected. Therefore, consider recent embolic event from the heart or proximal aorta (less likely posterior circulation only thromboembolism). 2. No associated hemorrhage or mass effect. 3. Otherwise no significant change in the MRI appearance of the brain since 2013.  Labs: 05/31/21  TSH 2.01 B12 133 (now on replenishment with monthly injections by PCP )   PREVIOUS  MEDICATIONS:   CURRENT MEDICATIONS:  Outpatient Encounter Medications as of 08/14/2021  Medication Sig   apixaban (ELIQUIS) 5 MG TABS tablet Take 2 tablets (10 mg total) by mouth 2 (two) times daily. (Patient taking differently: Take 5 mg by mouth 2 (two) times daily.)   calcium carbonate (OS-CAL - DOSED IN MG OF ELEMENTAL CALCIUM) 1250 (500 Ca) MG tablet Take 2 tablets by mouth daily with breakfast.   cholecalciferol (VITAMIN D3) 25 MCG (1000 UNIT) tablet Take 1,000 Units by mouth daily.   famotidine (PEPCID) 20 MG tablet Take 20 mg by mouth daily.   furosemide (LASIX) 40 MG tablet Take 1 tablet (40 mg total) by mouth daily. (Patient taking differently: Take 40 mg by mouth daily as needed for fluid.)   iron polysaccharides (NIFEREX) 150 MG capsule Take 150 mg by mouth daily.   memantine (NAMENDA) 10 MG tablet TAKE 1 TABLET (10 MG AT NIGHT) FOR 2 WEEKS, THEN INCREASE TO 1 TABLET (10 MG) TWICE A DAY   metoprolol  succinate (TOPROL-XL) 50 MG 24 hr tablet Take 50 mg by mouth daily.   Multiple Vitamins-Minerals (ICAPS PO) Take 1 capsule by mouth daily.   ROCKLATAN 0.02-0.005 % SOLN Place 1 drop into both eyes at bedtime.   sertraline (ZOLOFT) 100 MG tablet Take 100 mg by mouth daily.    QUEtiapine (SEROQUEL) 25 MG tablet Take 0.5 tablets (12.5 mg total) by mouth at bedtime. (Patient not taking: Reported on 08/14/2021)   No facility-administered encounter medications on file as of 08/14/2021.     Objective:     PHYSICAL EXAMINATION:    VITALS:   Vitals:   08/14/21 1256  BP: (!) 144/78  Pulse: (!) 55  Resp: 18  SpO2: 98%  Weight: 165 lb (74.8 kg)    GEN:  The patient appears stated age and is in NAD. HEENT:  Normocephalic, atraumatic.   Neurological examination:  General: NAD, well-groomed, appears stated age. Orientation: The patient is alert. Oriented to person, place, not to date Cranial nerves: There is good facial symmetry.The speech is fluent and clear. No aphasia or dysarthria. Fund of knowledge is reduced. Recent and remote memory are impaired. Attention and concentration are reduced.  Able to name objects and unable to repeat phrases.  Hearing is intact to conversational tone.    Sensation: Sensation is intact to light touch throughout. LTM amputation Motor: Strength is at least antigravity x4. Tremors: none  DTR's 2/4 in UE/LE     Montreal Cognitive Assessment  05/31/2021  Visuospatial/ Executive (0/5) 1  Naming (0/3) 3  Attention: Read list of digits (0/2) 0  Attention: Read list of letters (0/1) 0  Attention: Serial 7 subtraction starting at 100 (0/3) 0  Language: Repeat phrase (0/2) 0  Language : Fluency (0/1) 0  Abstraction (0/2) 0  Delayed Recall (0/5) 0  Orientation (0/6) 1  Total 5  Adjusted Score (based on education) 6   No flowsheet data found.  No flowsheet data found.     Movement examination: Patient in a wheelchair  Tone: There is normal tone in the  UE/LE Abnormal movements:  no tremor.  No myoclonus.  No asterixis.   Coordination:  There is no decremation with RAM's. Normal finger to nose  Gait and Station: The patient has some  difficulty arising out of a deep-seated chair has to use her hands. The patient's stride length is short. Gait is cautious and narrow.      Total time spent on today's visit was  30 minutes, including both face-to-face time and nonface-to-face time. Time included that spent on review of records (prior notes available to me/labs/imaging if pertinent), discussing treatment and goals, answering patient's questions and coordinating care.  Cc:  [name], MD Marlowe Kays, PA-C

## 2021-08-14 ENCOUNTER — Ambulatory Visit: Payer: Medicare HMO | Admitting: Physician Assistant

## 2021-08-14 ENCOUNTER — Other Ambulatory Visit: Payer: Self-pay | Admitting: Physician Assistant

## 2021-08-14 ENCOUNTER — Other Ambulatory Visit: Payer: Self-pay

## 2021-08-14 VITALS — BP 144/78 | HR 55 | Resp 18 | Wt 165.0 lb

## 2021-08-14 DIAGNOSIS — F0390 Unspecified dementia without behavioral disturbance: Secondary | ICD-10-CM

## 2021-08-14 MED ORDER — MEMANTINE HCL 10 MG PO TABS: ORAL_TABLET | ORAL | 3 refills | Status: DC

## 2021-08-14 NOTE — Patient Instructions (Addendum)
It was a pleasure to see you today at our office.   Recommendations:  Follow up in  6  months Continue Memantine 10 mg twice a day.  Use the walker to mobilize Continue Zoloft and B12 injections as prescribed by your Doctor   RECOMMENDATIONS FOR ALL PATIENTS WITH MEMORY PROBLEMS: 1. Continue to exercise (Recommend 30 minutes of walking everyday, or 3 hours every week) 2. Increase social interactions - continue going to [place] and enjoy social gatherings with friends and family 3. Eat healthy, avoid fried foods and eat more fruits and vegetables 4. Maintain adequate blood pressure, blood sugar, and blood cholesterol level. Reducing the risk of stroke and cardiovascular disease also helps promoting better memory. 5. Avoid stressful situations. Live a simple life and avoid aggravations. Organize your time and prepare for the next day in anticipation. 6. Sleep well, avoid any interruptions of sleep and avoid any distractions in the bedroom that may interfere with adequate sleep quality 7. Avoid sugar, avoid sweets as there is a strong link between excessive sugar intake, diabetes, and cognitive impairment We discussed the Mediterranean diet, which has been shown to help patients reduce the risk of progressive memory disorders and reduces cardiovascular risk. This includes eating fish, eat fruits and green leafy vegetables, nuts like almonds and hazelnuts, walnuts, and also use olive oil. Avoid fast foods and fried foods as much as possible. Avoid sweets and sugar as sugar use has been linked to worsening of memory function.  There is always a concern of gradual progression of memory problems. If this is the case, then we may need to adjust level of care according to patient needs. Support, both to the patient and caregiver, should then be put into place.     FALL PRECAUTIONS: Be cautious when walking. Scan the area for obstacles that may increase the risk of trips and falls. When getting up in the  mornings, sit up at the edge of the bed for a few minutes before getting out of bed. Consider elevating the bed at the head end to avoid drop of blood pressure when getting up. Walk always in a well-lit room (use night lights in the walls). Avoid area rugs or power cords from appliances in the middle of the walkways. Use a walker or a cane if necessary and consider physical therapy for balance exercise. Get your eyesight checked regularly.  FINANCIAL OVERSIGHT: Supervision, especially oversight when making financial decisions or transactions is also recommended.  HOME SAFETY: Consider the safety of the kitchen when operating appliances like stoves, microwave oven, and blender. Consider having supervision and share cooking responsibilities until no longer able to participate in those. Accidents with firearms and other hazards in the house should be identified and addressed as well.   ABILITY TO BE LEFT ALONE: If patient is unable to contact 911 operator, consider using LifeLine, or when the need is there, arrange for someone to stay with patients. Smoking is a fire hazard, consider supervision or cessation. Risk of wandering should be assessed by caregiver and if detected at any point, supervision and safe proof recommendations should be instituted.  MEDICATION SUPERVISION: Inability to self-administer medication needs to be constantly addressed. Implement a mechanism to ensure safe administration of the medications.      Mediterranean Diet A Mediterranean diet refers to food and lifestyle choices that are based on the traditions of countries located on the [employer] [place]. This way of eating has been shown to help prevent certain conditions and improve outcomes  for people who have chronic diseases, like kidney disease and heart disease. What are tips for following this plan? Lifestyle  Cook and eat meals together with your family, when possible. Drink enough fluid to keep your urine clear or  pale yellow. Be physically active every day. This includes: Aerobic exercise like running or swimming. Leisure activities like gardening, walking, or housework. Get 7-8 hours of sleep each night. If recommended by your health care provider, drink red wine in moderation. This means 1 glass a day for nonpregnant women and 2 glasses a day for men. A glass of wine equals 5 oz (150 mL). Reading food labels  Check the serving size of packaged foods. For foods such as rice and pasta, the serving size refers to the amount of cooked product, not dry. Check the total fat in packaged foods. Avoid foods that have saturated fat or trans fats. Check the ingredients list for added sugars, such as corn syrup. Shopping  At the grocery store, buy most of your food from the areas near the walls of the store. This includes: Fresh fruits and vegetables (produce). Grains, beans, nuts, and seeds. Some of these may be available in unpackaged forms or large amounts (in bulk). Fresh seafood. Poultry and eggs. Low-fat dairy products. Buy whole ingredients instead of prepackaged foods. Buy fresh fruits and vegetables in-season from local farmers markets. Buy frozen fruits and vegetables in resealable bags. If you do not have access to quality fresh seafood, buy precooked frozen shrimp or canned fish, such as tuna, salmon, or sardines. Buy small amounts of raw or cooked vegetables, salads, or olives from the deli or salad bar at your store. Stock your pantry so you always have certain foods on hand, such as olive oil, canned tuna, canned tomatoes, rice, pasta, and beans. Cooking  Cook foods with extra-virgin olive oil instead of using butter or other vegetable oils. Have meat as a side dish, and have vegetables or grains as your main dish. This means having meat in small portions or adding small amounts of meat to foods like pasta or stew. Use beans or vegetables instead of meat in common dishes like chili or  lasagna. Experiment with different cooking methods. Try roasting or broiling vegetables instead of steaming or sauteing them. Add frozen vegetables to soups, stews, pasta, or rice. Add nuts or seeds for added healthy fat at each meal. You can add these to yogurt, salads, or vegetable dishes. Marinate fish or vegetables using olive oil, lemon juice, garlic, and fresh herbs. Meal planning  Plan to eat 1 vegetarian meal one day each week. Try to work up to 2 vegetarian meals, if possible. Eat seafood 2 or more times a week. Have healthy snacks readily available, such as: Vegetable sticks with hummus. Greek yogurt. Fruit and nut trail mix. Eat balanced meals throughout the week. This includes: Fruit: 2-3 servings a day Vegetables: 4-5 servings a day Low-fat dairy: 2 servings a day Fish, poultry, or lean meat: 1 serving a day Beans and legumes: 2 or more servings a week Nuts and seeds: 1-2 servings a day Whole grains: 6-8 servings a day Extra-virgin olive oil: 3-4 servings a day Limit red meat and sweets to only a few servings a month What are my food choices? Mediterranean diet Recommended Grains: Whole-grain pasta. Brown rice. Bulgar wheat. Polenta. Couscous. Whole-wheat bread. Orpah Cobb. Vegetables: Artichokes. Beets. Broccoli. Cabbage. Carrots. Eggplant. Green beans. Chard. Kale. Spinach. Onions. Leeks. Peas. Squash. Tomatoes. Peppers. Radishes. Fruits: Apples. Apricots. Avocado.  Berries. Bananas. Cherries. Dates. Figs. Grapes. Lemons. Melon. Oranges. Peaches. Plums. Pomegranate. Meats and other protein foods: Beans. Almonds. Sunflower seeds. Pine nuts. Peanuts. Cod. Salmon. Scallops. Shrimp. Tuna. Tilapia. Clams. Oysters. Eggs. Dairy: Low-fat milk. Cheese. Greek yogurt. Beverages: Water. Red wine. Herbal tea. Fats and oils: Extra virgin olive oil. Avocado oil. Grape seed oil. Sweets and desserts: [place] yogurt with honey. Baked apples. Poached pears. Trail mix. Seasoning and  other foods: Basil. Cilantro. Coriander. Cumin. Mint. Parsley. Sage. Rosemary. Tarragon. Garlic. Oregano. Thyme. Pepper. Balsalmic vinegar. Tahini. Hummus. Tomato sauce. Olives. Mushrooms. Limit these Grains: Prepackaged pasta or rice dishes. Prepackaged cereal with added sugar. Vegetables: Deep fried potatoes (french fries). Fruits: Fruit canned in syrup. Meats and other protein foods: Beef. Pork. Lamb. Poultry with skin. Hot dogs. Tomasa Blase. Dairy: Ice cream. Sour cream. Whole milk. Beverages: Juice. Sugar-sweetened soft drinks. Beer. Liquor and spirits. Fats and oils: Butter. Canola oil. Vegetable oil. Beef fat (tallow). Lard. Sweets and desserts: Cookies. Cakes. Pies. Candy. Seasoning and other foods: Mayonnaise. Premade sauces and marinades. The items listed may not be a complete list. Talk with your dietitian about what dietary choices are right for you. Summary The Mediterranean diet includes both food and lifestyle choices. Eat a variety of fresh fruits and vegetables, beans, nuts, seeds, and whole grains. Limit the amount of red meat and sweets that you eat. Talk with your health care provider about whether it is safe for you to drink red wine in moderation. This means 1 glass a day for nonpregnant women and 2 glasses a day for men. A glass of wine equals 5 oz (150 mL). This information is not intended to replace advice given to you by your health care provider. Make sure you discuss any questions you have with your health care provider. Document Released: 07/12/2016 Document Revised: 08/14/2016 Document Reviewed: 07/12/2016 Elsevier Interactive Patient Education  2017 [employer].

## 2021-08-21 ENCOUNTER — Ambulatory Visit: Payer: Medicare Other | Admitting: Physician Assistant

## 2021-11-18 ENCOUNTER — Other Ambulatory Visit: Payer: Self-pay | Admitting: Physician Assistant

## 2022-02-14 ENCOUNTER — Encounter: Payer: Self-pay | Admitting: Physician Assistant

## 2022-02-14 ENCOUNTER — Ambulatory Visit: Payer: Medicare HMO | Admitting: Physician Assistant

## 2022-02-14 ENCOUNTER — Other Ambulatory Visit: Payer: Self-pay

## 2022-02-14 VITALS — BP 155/82 | HR 62 | Resp 18 | Ht 69.0 in | Wt 160.0 lb

## 2022-02-14 DIAGNOSIS — G301 Alzheimer's disease with late onset: Secondary | ICD-10-CM

## 2022-02-14 DIAGNOSIS — F02C18 Dementia in other diseases classified elsewhere, severe, with other behavioral disturbance: Secondary | ICD-10-CM | POA: Diagnosis not present

## 2022-02-14 MED ORDER — MEMANTINE HCL 10 MG PO TABS: ORAL_TABLET | ORAL | 3 refills | Status: DC

## 2022-02-14 MED ORDER — QUETIAPINE FUMARATE 25 MG PO TABS: ORAL_TABLET | ORAL | 2 refills | Status: DC

## 2022-02-14 NOTE — Patient Instructions (Addendum)
It was a pleasure to see you today at our office.  ? ?Recommendations: ? ?Follow up in  6  months ?Continue Memantine 10 mg twice a day.  ?Use the walker to mobilize ?Continue Zoloft and B12 injections as prescribed by your Doctor  ?Continue Quetiapine 12.5  mg at night, may increase to 25 mg at night for hallucinations and agitation  ? ?RECOMMENDATIONS FOR ALL PATIENTS WITH MEMORY PROBLEMS: ?1. Continue to exercise (Recommend 30 minutes of walking everyday, or 3 hours every week) ?2. Increase social interactions - continue going to [place] and enjoy social gatherings with friends and family ?3. Eat healthy, avoid fried foods and eat more fruits and vegetables ?4. Maintain adequate blood pressure, blood sugar, and blood cholesterol level. Reducing the risk of stroke and cardiovascular disease also helps promoting better memory. ?5. Avoid stressful situations. Live a simple life and avoid aggravations. Organize your time and prepare for the next day in anticipation. ?6. Sleep well, avoid any interruptions of sleep and avoid any distractions in the bedroom that may interfere with adequate sleep quality ?7. Avoid sugar, avoid sweets as there is a strong link between excessive sugar intake, diabetes, and cognitive impairment ?We discussed the Mediterranean diet, which has been shown to help patients reduce the risk of progressive memory disorders and reduces cardiovascular risk. This includes eating fish, eat fruits and green leafy vegetables, nuts like almonds and hazelnuts, walnuts, and also use olive oil. Avoid fast foods and fried foods as much as possible. Avoid sweets and sugar as sugar use has been linked to worsening of memory function. ? ?There is always a concern of gradual progression of memory problems. If this is the case, then we may need to adjust level of care according to patient needs. Support, both to the patient and caregiver, should then be put into place.  ? ? ? ?FALL PRECAUTIONS: Be cautious when  walking. Scan the area for obstacles that may increase the risk of trips and falls. When getting up in the mornings, sit up at the edge of the bed for a few minutes before getting out of bed. Consider elevating the bed at the head end to avoid drop of blood pressure when getting up. Walk always in a well-lit room (use night lights in the walls). Avoid area rugs or power cords from appliances in the middle of the walkways. Use a walker or a cane if necessary and consider physical therapy for balance exercise. Get your eyesight checked regularly. ? ?FINANCIAL OVERSIGHT: Supervision, especially oversight when making financial decisions or transactions is also recommended. ? ?HOME SAFETY: Consider the safety of the kitchen when operating appliances like stoves, microwave oven, and blender. Consider having supervision and share cooking responsibilities until no longer able to participate in those. Accidents with firearms and other hazards in the house should be identified and addressed as well. ? ? ?ABILITY TO BE LEFT ALONE: If patient is unable to contact 911 operator, consider using LifeLine, or when the need is there, arrange for someone to stay with patients. Smoking is a fire hazard, consider supervision or cessation. Risk of wandering should be assessed by caregiver and if detected at any point, supervision and safe proof recommendations should be instituted. ? ?MEDICATION SUPERVISION: Inability to self-administer medication needs to be constantly addressed. Implement a mechanism to ensure safe administration of the medications. ? ? ? ? ? ?Mediterranean Diet ?A Mediterranean diet refers to food and lifestyle choices that are based on the traditions of countries located  on the [place]. This way of eating has been shown to help prevent certain conditions and improve outcomes for people who have chronic diseases, like kidney disease and heart disease. ?What are tips for following this plan? ?Lifestyle  ?Cook  and eat meals together with your family, when possible. ?Drink enough fluid to keep your urine clear or pale yellow. ?Be physically active every day. This includes: ?Aerobic exercise like running or swimming. ?Leisure activities like gardening, walking, or housework. ?Get 7-8 hours of sleep each night. ?If recommended by your health care provider, drink red wine in moderation. This means 1 glass a day for nonpregnant women and 2 glasses a day for men. A glass of wine equals 5 oz (150 mL). ?Reading food labels  ?Check the serving size of packaged foods. For foods such as rice and pasta, the serving size refers to the amount of cooked product, not dry. ?Check the total fat in packaged foods. Avoid foods that have saturated fat or trans fats. ?Check the ingredients list for added sugars, such as corn syrup. ?Shopping  ?At the grocery store, buy most of your food from the areas near the walls of the store. This includes: ?Fresh fruits and vegetables (produce). ?Grains, beans, nuts, and seeds. Some of these may be available in unpackaged forms or large amounts (in bulk). ?Fresh seafood. ?Poultry and eggs. ?Low-fat dairy products. ?Buy whole ingredients instead of prepackaged foods. ?Buy fresh fruits and vegetables in-season from local farmers markets. ?Buy frozen fruits and vegetables in resealable bags. ?If you do not have access to quality fresh seafood, buy precooked frozen shrimp or canned fish, such as tuna, salmon, or sardines. ?Buy small amounts of raw or cooked vegetables, salads, or olives from the deli or salad bar at your store. ?Stock your pantry so you always have certain foods on hand, such as olive oil, canned tuna, canned tomatoes, rice, pasta, and beans. ?Cooking  ?Cook foods with extra-virgin olive oil instead of using butter or other vegetable oils. ?Have meat as a side dish, and have vegetables or grains as your main dish. This means having meat in small portions or adding small amounts of meat to  foods like pasta or stew. ?Use beans or vegetables instead of meat in common dishes like chili or lasagna. ?Experiment with different cooking methods. Try roasting or broiling vegetables instead of steaming or saut?eing them. ?Add frozen vegetables to soups, stews, pasta, or rice. ?Add nuts or seeds for added healthy fat at each meal. You can add these to yogurt, salads, or vegetable dishes. ?Marinate fish or vegetables using olive oil, lemon juice, garlic, and fresh herbs. ?Meal planning  ?Plan to eat 1 vegetarian meal one day each week. Try to work up to 2 vegetarian meals, if possible. ?Eat seafood 2 or more times a week. ?Have healthy snacks readily available, such as: ?Vegetable sticks with hummus. ?[place] yogurt. ?Fruit and nut trail mix. ?Eat balanced meals throughout the week. This includes: ?Fruit: 2-3 servings a day ?Vegetables: 4-5 servings a day ?Low-fat dairy: 2 servings a day ?Fish, poultry, or lean meat: 1 serving a day ?Beans and legumes: 2 or more servings a week ?Nuts and seeds: 1-2 servings a day ?Whole grains: 6-8 servings a day ?Extra-virgin olive oil: 3-4 servings a day ?Limit red meat and sweets to only a few servings a month ?What are my food choices? ?Mediterranean diet ?Recommended ?Grains: Whole-grain pasta. Brown rice. Bulgar wheat. Polenta. Couscous. Whole-wheat bread. [place]. ?Vegetables: Artichokes. Beets. Broccoli.  Cabbage. Carrots. Eggplant. Green beans. Chard. Kale. Spinach. Onions. Leeks. Peas. Squash. Tomatoes. Peppers. Radishes. ?Fruits: Apples. Apricots. Avocado. Berries. Bananas. Cherries. Dates. Figs. Grapes. Lemons. Melon. Oranges. Peaches. Plums. Pomegranate. ?Meats and other protein foods: Beans. Almonds. Sunflower seeds. Pine nuts. Peanuts. [place]. Salmon. Scallops. Shrimp. [place]. Tilapia. Clams. Oysters. Eggs. ?Dairy: Low-fat milk. Cheese. Greek yogurt. ?Beverages: Water. Red wine. Herbal tea. ?Fats and oils: Extra virgin olive oil. Avocado oil. Grape seed  oil. ?Sweets and desserts: [place] yogurt with honey. Baked apples. Poached pears. Trail mix. ?Seasoning and other foods: Basil. Cilantro. Coriander. Cumin. Mint. Parsley. Sage. Rosemary. Tarragon. Garlic. Oregano. T

## 2022-02-14 NOTE — Progress Notes (Signed)
? ?Assessment/Plan:  ? ? ?Dementia with Behavioral Disturbance, likely mixed.  ? ?Patient stable from cognitive standpoint.  Last MoCA on 05/22/2021 was 6/30 .  She is on memantine 10 mg twice daily tolerating well.  She is also on Seroquel 12.5 mg nightly for agitation and mood changes. ? ? Recommendations:  ?Discussed safety both in and out of the home.  ?Discussed the importance of regular daily schedule to maintain brain function.  ?Continue to monitor mood with Seroquel, may increase to 25 mg nightly, black box warning was discussed.  Continue Zoloft by PCP ?Stay active at least 30 minutes at least 3 times a week.  Recommend the use of a walker for mobilizing, for safety. ?Naps should be scheduled and should be no longer than 60 minutes and should not occur after 2 PM.  ?Continue Memantine, 1 tablet (10 mg) twice a day.  Side effects discussed refills sent to the pharmacy ?Continue B12 supplementation ?Follow up in 6  months. ? ? ?Case discussed with Dr. Delice Lesch who agrees with the plan ? ? ? ? ?Subjective:  ? ?ED visits since last seen: none  ?Hospital admissions: none ? ?Cassandra [name] is a 88 y.o. 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 She was last seen on 08/14/2021.  Last MoCA on 05/31/2021 was 6/30.[name] MRI brain with age related changes and old moderate chronic microvascular ischemic changes. Small chronic right cerebellar and bilateral deep cerebral white matter infarcts and question  chronic  of dural defect with herniation of CSF and abnormal cerebellar parenchyma posterior to the right sigmoid sinus.  Labs remarkable for low B12, now on replenishment. Patient on Memantine 10 mg bid, tolerating well.  This patient is accompanied in the office by her daughter who supplements the  history.  Previous records as well as any outside records available were reviewed prior to todays visit.   ? ?Since her last visit, the patient reports that her memory is about the same, "I continue to forget the phone numbers and the names like before ".  She continues to be on memantine 10 mg twice daily, tolerating well.  She reports her memory being about the same as prior.  Her hallucinations are less evident, and she is on Seroquel 12.5 mg nightly, helping with agitation.  Occasionally, she has an increased moment of irritability, and wonders if she could increase the Seroquel dose during the spirits of time.  Her mirrors are covered, so she is now comfortable in her room.  She sleeps "okay ", continues to have vivid dreams, without REM behavior or sleepwalking.  She needs some assistance with bathing and dressing.  Cassandra [name] is in charge of the medications, finances.she uses a cane to ambulate, but her daughter is going to get her a walker, because one time" she got tangled up and fell ", without loss of consciousness or head injuries.  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  She is not interested in physical therapy. ?She denies any  headaches, double vision, dizziness, focal numbness or tingling, unilateral weakness or tremors.  Denies urine incontinence or retention.  Denies constipation or diarrhea.  Denies anosmia.  ? ? ? INITIAL EVALUATION 05/31/21  ?The patient is seen in neurologic consultation at the request  of Cassandra Sites, MD for the evaluation of memory.  The patient is accompanied by daughter Cassandra [name] who supplements the history. ?  ?The patient is a 88 y.o. year old female who has had memory issues for about  one year per daughter's report when she began seeing people, mainly women, standing at the door.  About 4 months ago, the patient had a kidney infection accompanied by acute mental status changes, treated with antibiotics with  improvement of her symptoms.   About 3 weeks ago, "her confusion came back ".  She began forgetting names and phone numbers that she knew by heart.  Her daughter, who lives next door, reported that her mother was beating her own house, "wanted to go home", not recognizing her own environment.  She has been more irritable, has "become meaner, especially over the last 2 weeks.  This is new, since my mother used to be so easygoing "-daughter says. ?She sleeps "okay", wakes up 1-2 times at night and has very vivid dreams, talking in her sleep according to her husband.  No apparent sleepwalking.  Her mirror had to be covered because she confuses it with another room and wants to walk towards it. She is independent of bathing and her daughter helps her to dress up.  Cassandra [name] has taken over the medications, because she began to forget to take them especially over the last 3 weeks and the finances 3 months ago.  Her appetite is poor at least for 1 week, with failure to thrive.  No trouble swallowing.  She cooks "some" with her husband's supervision.  He denies having left accidentally the stove or the faucet.  She does leave objects in unusual places.  Recently, she placed all the empty Tupperware in the refrigerator.  She ambulates with assistance, especially "since she is due for another knee injection ".  The patient has significant arthritis, and has mobility issues.  Yesterday, she had a mechanical fall, and hit her head in the right occipital area without LOC.  She does admit to falling often especially over the last 4 months. She does not drive, "angry about it"-daughter says. ?  She denies any history of headaches, double vision, dizziness, focal numbness or tingling, unilateral weakness or tremors.  Denies urine incontinence or retention.  Denies constipation or diarrhea.  Denies anosmia.  Denies any history of OSA, alcohol or tobacco.  Family history remarkable for mother who had dementia ? ?MRI brain 06/12/21 ?No acute intracranial abnormality.  Moderate chronic microvascular ischemic changes. Small chronic right cerebellar and bilateral deep cerebral white matter infarcts. ?  ?Question of dural defect with herniation of CSF and abnormal cerebellar parenchyma posterior to the right sigmoid sinus  (chronic finding).  ?  ?MRI brain 01/04/16 for stroke workup ?1. 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 Left MCA, bilateral PCA, and SCA territories are affected. Therefore, consider recent embolic event from the heart or proximal aorta (less likely posterior circulation only thromboembolism). ?2. No associated hemorrhage or mass effect. ?3. Otherwise no significant change in the MRI appearance of the brain since 2013. ? ?Labs: 05/31/21  ?TSH 2.01 ?B12 133 (now on replenishment with monthly injections by PCP )  ? ?PREVIOUS MEDICATIONS:  ? ?CURRENT MEDICATIONS:  ?Outpatient Encounter Medications as of 02/14/2022  ?Medication Sig  ? apixaban (ELIQUIS) 5 MG TABS tablet Take 2 tablets (10 mg total) by mouth 2 (two) times daily. (Patient taking differently: Take 5 mg by mouth 2 (two) times daily.)  ?  calcium carbonate (OS-CAL - DOSED IN MG OF ELEMENTAL CALCIUM) 1250 (500 Ca) MG tablet Take 2 tablets by mouth daily with breakfast.  ? cholecalciferol (VITAMIN D3) 25 MCG (1000 UNIT) tablet Take 1,000 Units by mouth daily.  ? famotidine (PEPCID) 20 MG tablet Take 20 mg by mouth daily.  ? furosemide (LASIX) 40 MG tablet Take 1 tablet (40 mg total) by mouth daily. (Patient taking differently: Take 40 mg by mouth daily as needed for fluid.)  ? iron polysaccharides (NIFEREX) 150 MG capsule Take 150 mg by mouth daily.  ? metoprolol succinate (TOPROL-XL) 50 MG 24 hr tablet Take 50 mg by mouth daily.  ? ROCKLATAN 0.02-0.005 % SOLN Place 1 drop into both eyes at bedtime.  ? sertraline (ZOLOFT) 100 MG tablet Take 100 mg by mouth daily.   ? [DISCONTINUED] memantine (NAMENDA) 10 MG tablet Take 1 tab (10 mg ) twice a day  ? [DISCONTINUED]  QUEtiapine (SEROQUEL) 25 MG tablet TAKE 0.5 TABLETS BY MOUTH AT BEDTIME.  ? memantine (NAMENDA) 10 MG tablet Take 1 tab (10 mg ) twice a day  ? Multiple Vitamins-Minerals (ICAPS PO) Take 1 capsule by mouth daily.  ?

## 2022-04-13 ENCOUNTER — Telehealth: Payer: Self-pay

## 2022-04-13 ENCOUNTER — Other Ambulatory Visit: Payer: Self-pay

## 2022-04-13 MED ORDER — QUETIAPINE FUMARATE 25 MG PO TABS: ORAL_TABLET | ORAL | 1 refills | Status: DC

## 2022-04-13 NOTE — Telephone Encounter (Signed)
?  Continue Quetiapine 12.5  mg at night, may increase to 25 mg at night for hallucinations and agitation. Needs new Rx sent in  ?

## 2022-04-13 NOTE — Telephone Encounter (Signed)
No answer at 4:16 04/13/2022  ?

## 2022-04-17 NOTE — Telephone Encounter (Signed)
Pt's daughter called back in returning Christy's call 

## 2022-04-17 NOTE — Telephone Encounter (Signed)
Telephone call to patient daughter [name], [place] sent please follow up with the Patient PCP for further refills.  ?

## 2022-08-20 NOTE — Progress Notes (Signed)
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 RH female with a history of  hypertension, hyperlipidemia, anemia of chronic disease, Vit D deficiency, CKD stage 3b, CAD, depression, stroke in 2017 and prior PE on AC,  B12 deficiency seen today in follow up for Dementia with Behavioral Disturbance  Patient is currently on memantine 10 mg bid.Cassandra [name] MRI brain personally reviewed was remarkable for  age related changes and old moderate chronic microvascular ischemic changes. Small chronic right cerebellar and bilateral deep cerebral white matter infarcts and question  chronic  of dural defect with herniation of CSF and abnormal cerebellar parenchyma posterior to the right sigmoid sinus MMSE today is   /30  with delayed recall  /3       Follow up in  6 months. Continue memantine10 mg bid Continue to monitor mood with  Seroquel 25 mg nightly and Zoloft by PCP Continue B12 supplements    Subjective:    This patient is accompanied in the office by her daughter who supplements the history.  Previous records as well as any outside records available were reviewed prior to todays visit.    Any changes in memory since last visit? Since her last visit, the patient reports that her memory is about the same, "I continue to forget the phone numbers and the names like before ".  She continues to be on memantine 10 mg twice daily, tolerating well.  She reports her memory being about the same as prior.   repeats oneself?  Endorsed Disoriented when walking into a room?  Patient denies   Leaving objects in unusual places?  Patient denies   Ambulates  with difficulty?   Patient denies   Recent falls?  Patient denies   Any head injuries?  Patient denies   History of seizures?   Patient denies   Wandering behavior?  Patient denies   Patient drives?   Patient no longer drives  Any mood changes since last  visit?  Patient denies   Any worsening depression?:  Patient denies   Hallucinations?  Patient denies    Her hallucinations are less evident, and she is on Seroquel 12.5 mg nightly, helping with agitation.  Occasionally, she has an increased moment of irritability, and wonders if she could increase the Seroquel dose during the spirits of time.  Her mirrors are covered, so she is now comfortable in her room.  Paranoia?  Patient denies   Patient reports that sleeps well with vivid dreams, REM behavior or sleepwalking   History of sleep apnea?  Patient denies   Any hygiene concerns?  Patient denies   Independent of bathing and dressing?  Needs some assistance Does the patient needs help with medications?  Denies Who is in charge of the finances?   is in charge    Any changes in appetite?  Patient denies   Patient have trouble swallowing? Patient denies   Does the patient cook?  Patient denies   Any [name] accidents such as leaving the stove on? Patient denies   Any headaches?  Patient denies   Double vision? Patient denies   Any focal numbness or tingling?  Patient denies   Chronic back pain Patient denies   Unilateral weakness?  Patient denies   Any tremors?  Patient denies   Any history of anosmia?  Patient denies   Any incontinence of urine?  Patient denies   Any bowel  dysfunction?   Patient denies      Patient lives with:     Cassandra [name] is a 88 y.o. female   with risk factors including age,. She was last seen on 08/14/2021.  Last MoCA on 05/31/2021 was 6/30.Cassandra [name] MRI brain with age related changes and old moderate chronic microvascular ischemic changes. Small chronic right cerebellar and bilateral deep cerebral white matter infarcts and question  chronic  of dural defect with herniation of CSF and abnormal cerebellar parenchyma posterior to the right sigmoid sinus.  Labs remarkable for low B12, now on replenishment. Patient on Memantine 10 mg bid, tolerating well.  This patient is  accompanied in the office by her daughter who supplements the history.  Previous records as well as any outside records available were reviewed prior to todays visit.     Since her last visit, the patient reports that her memory is about the same, "I continue to forget the phone numbers and the names like before ".  She continues to be on memantine 10 mg twice daily, tolerating well.  She reports her memory being about the same as prior.  Her hallucinations are less evident, and she is on Seroquel 12.5 mg nightly, helping with agitation.  Occasionally, she has an increased moment of irritability, and wonders if she could increase the Seroquel dose during the spirits of time.  Her mirrors are covered, so she is now comfortable in her room.  She sleeps "okay ", continues to have vivid dreams, without REM behavior or sleepwalking.  She needs some assistance with bathing and dressing.  Cassandra [name] is in charge of the medications, finances.she uses a cane to ambulate, but her daughter is going to get her a walker, because one time" she got tangled up and fell ", without loss of consciousness or head injuries.  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  She is not interested in physical therapy. She denies any  headaches, double vision, dizziness, focal numbness or tingling, unilateral weakness or tremors.  Denies urine incontinence or retention.  Denies constipation or diarrhea.  Denies anosmia.       INITIAL EVALUATION 05/31/21  The patient is seen in neurologic consultation at the request of Sharilyn Sites, MD for the evaluation of memory.  The patient is accompanied by daughter Cassandra [name] who supplements the history.   The patient is a 88 y.o. year old female who has had memory issues for about  one year per daughter's report when she began seeing people, mainly women, standing at the door.  About 4 months ago, the patient had a kidney infection accompanied by acute mental status changes,  treated with antibiotics with  improvement of her symptoms.  About 3 weeks ago, "her confusion came back ".  She began forgetting names and phone numbers that she knew by heart.  Her daughter, who lives next door, reported that her mother was beating her own house, "wanted to go home", not recognizing her own environment.  She has been more irritable, has "become meaner, especially over the last 2 weeks.  This is new, since my mother used to be so easygoing "-daughter says. She sleeps "okay", wakes up 1-2 times at night and has very vivid dreams, talking in her sleep according to her husband.  No apparent sleepwalking.  Her mirror had to be covered because she confuses it with another room and wants to walk towards it. She is independent of bathing and her daughter  helps her to dress up.  Cassandra [name] has taken over the medications, because she began to forget to take them especially over the last 3 weeks and the finances 3 months ago.  Her appetite is poor at least for 1 week, with failure to thrive.  No trouble swallowing.  She cooks "some" with her husband's supervision.  He denies having left accidentally the stove or the faucet.  She does leave objects in unusual places.  Recently, she placed all the empty Tupperware in the refrigerator.  She ambulates with assistance, especially "since she is due for another knee injection ".  The patient has significant arthritis, and has mobility issues.  Yesterday, she had a mechanical fall, and hit her head in the right occipital area without LOC.  She does admit to falling often especially over the last 4 months. She does not drive, "angry about it"-daughter says.   She denies any history of headaches, double vision, dizziness, focal numbness or tingling, unilateral weakness or tremors.  Denies urine incontinence or retention.  Denies constipation or diarrhea.  Denies anosmia.  Denies any history of OSA, alcohol or tobacco.  Family history remarkable for mother who had  dementia   MRI brain 06/12/21 No acute intracranial abnormality. Moderate chronic microvascular ischemic changes. Small chronic right cerebellar and bilateral deep cerebral white matter infarcts.   Question of dural defect with herniation of CSF and abnormal cerebellar parenchyma posterior to the right sigmoid sinus  (chronic finding).    MRI brain 01/04/16 for stroke workup 1. 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 Left MCA, bilateral PCA, and SCA territories are affected. Therefore, consider recent embolic event from the heart or proximal aorta (less likely posterior circulation only thromboembolism). 2. No associated hemorrhage or mass effect. 3. Otherwise no significant change in the MRI appearance of the brain since 2013.   Labs: 05/31/21  TSH 2.01 B12 133 (now on replenishment with monthly injections by PCP ) PREVIOUS MEDICATIONS:   CURRENT MEDICATIONS:  Outpatient Encounter Medications as of 08/21/2022  Medication Sig   apixaban (ELIQUIS) 5 MG TABS tablet Take 2 tablets (10 mg total) by mouth 2 (two) times daily. (Patient taking differently: Take 5 mg by mouth 2 (two) times daily.)   calcium carbonate (OS-CAL - DOSED IN MG OF ELEMENTAL CALCIUM) 1250 (500 Ca) MG tablet Take 2 tablets by mouth daily with breakfast.   cholecalciferol (VITAMIN D3) 25 MCG (1000 UNIT) tablet Take 1,000 Units by mouth daily.   famotidine (PEPCID) 20 MG tablet Take 20 mg by mouth daily.   furosemide (LASIX) 40 MG tablet Take 1 tablet (40 mg total) by mouth daily. (Patient taking differently: Take 40 mg by mouth daily as needed for fluid.)   iron polysaccharides (NIFEREX) 150 MG capsule Take 150 mg by mouth daily.   memantine (NAMENDA) 10 MG tablet Take 1 tab (10 mg ) twice a day   metoprolol succinate (TOPROL-XL) 50 MG 24 hr tablet Take 50 mg by mouth daily.   Multiple Vitamins-Minerals (ICAPS PO) Take 1 capsule by mouth daily.   QUEtiapine (SEROQUEL) 25 MG tablet  TAKE 0.5 TABLETS BY MOUTH AT BEDTIME.   ROCKLATAN 0.02-0.005 % SOLN Place 1 drop into both eyes at bedtime.   sertraline (ZOLOFT) 100 MG tablet Take 100 mg by mouth daily.    No facility-administered encounter medications on file as of 08/21/2022.        No data to display  05/31/2021   12:00 PM  Montreal Cognitive Assessment   Visuospatial/ Executive (0/5) 1  Naming (0/3) 3  Attention: Read list of digits (0/2) 0  Attention: Read list of letters (0/1) 0  Attention: Serial 7 subtraction starting at 100 (0/3) 0  Language: Repeat phrase (0/2) 0  Language : Fluency (0/1) 0  Abstraction (0/2) 0  Delayed Recall (0/5) 0  Orientation (0/6) 1  Total 5  Adjusted Score (based on education) 6    Objective:     PHYSICAL EXAMINATION:    VITALS:  There were no vitals filed for this visit.  GEN:  The patient appears stated age and is in NAD. HEENT:  Normocephalic, atraumatic.   Neurological examination:  General: NAD, well-groomed, appears stated age. Orientation: The patient is alert. Oriented to person, place and date Cranial nerves: There is good facial symmetry.The speech is fluent and clear. No aphasia or dysarthria. Fund of knowledge is appropriate. Recent and remote memory are impaired. Attention and concentration are reduced.  Able to name objects and repeat phrases.  Hearing is intact to conversational tone.    Sensation: Sensation is intact to light touch throughout Motor: Strength is at least antigravity x4. Tremors: none  DTR's 2/4 in UE/LE     Movement examination: Tone: There is normal tone in the UE/LE Abnormal movements:  no tremor.  No myoclonus.  No asterixis.   Coordination:  There is no decremation with RAM's. Normal finger to nose  Gait and Station: The patient has no difficulty arising out of a deep-seated chair without the use of the hands. The patient's stride length is good.  Gait is cautious and narrow.    Thank you for allowing [place] the  opportunity to participate in the care of this nice patient. Please do not hesitate to contact us for any questions or concerns.   Total time spent on today's visit was *** minutes dedicated to this patient today, preparing to see patient, examining the patient, ordering tests and/or medications and counseling the patient, documenting clinical information in the EHR or other health record, independently interpreting results and communicating results to the patient/family, discussing treatment and goals, answering patient's questions and coordinating care.  Cc:  Sharilyn Sites, MD  Sharene Butters 08/20/2022 2:26 PM

## 2022-08-21 ENCOUNTER — Ambulatory Visit: Payer: Medicare HMO | Admitting: Physician Assistant

## 2022-08-21 ENCOUNTER — Encounter: Payer: Self-pay | Admitting: Physician Assistant

## 2022-08-21 VITALS — BP 171/109 | HR 64 | Resp 20 | Ht 69.0 in

## 2022-08-21 DIAGNOSIS — G309 Alzheimer's disease, unspecified: Secondary | ICD-10-CM | POA: Diagnosis not present

## 2022-08-21 DIAGNOSIS — F0280 Dementia in other diseases classified elsewhere without behavioral disturbance: Secondary | ICD-10-CM | POA: Diagnosis not present

## 2022-08-21 NOTE — Patient Instructions (Signed)
It was a pleasure to see you today at our office.   Recommendations:  Follow up in  6 months Continue Memantine 10 mg twice daily.Side effects were discussed  Continue B12 Continue Seroquel and Sertraline   Whom to call:  Memory  decline, memory medications: Call our office [phone]   For psychiatric meds, mood meds: Please have your primary care physician manage these medications.   Counseling regarding caregiver distress, including caregiver depression, anxiety and issues regarding community resources, adult day care programs, adult living facilities, or memory care questions:   Feel free to contact Misty Lisabeth Register, Social Worker at [phone]   For assessment of decision of mental capacity and competency:  Call Dr. Erick Blinks, geriatric psychiatrist at [phone]  For guidance in geriatric dementia issues please call [facility] [phone]  For guidance regarding [facility] and if placement were needed at the facility, contact Sidney Ace, Social Worker tel: [phone]  If you have any severe symptoms of a stroke, or other severe issues such as confusion,severe chills or fever, etc call 911 or go to the ER as you may need to be evaluated further   Feel free to visit Facebook page " [employer]" for tips of how to care for people with memory problems.   Feel free to go to the following database for funded clinical studies conducted around the world: RankChecks.se   https://www.triadclinicaltrials.com/     RECOMMENDATIONS FOR ALL PATIENTS WITH MEMORY PROBLEMS: 1. Continue to exercise (Recommend 30 minutes of walking everyday, or 3 hours every week) 2. Increase social interactions - continue going to [place] and enjoy social gatherings with friends and family 3. Eat healthy, avoid fried foods and eat more fruits and vegetables 4. Maintain adequate blood pressure, blood sugar, and blood cholesterol level. Reducing  the risk of stroke and cardiovascular disease also helps promoting better memory. 5. Avoid stressful situations. Live a simple life and avoid aggravations. Organize your time and prepare for the next day in anticipation. 6. Sleep well, avoid any interruptions of sleep and avoid any distractions in the bedroom that may interfere with adequate sleep quality 7. Avoid sugar, avoid sweets as there is a strong link between excessive sugar intake, diabetes, and cognitive impairment We discussed the Mediterranean diet, which has been shown to help patients reduce the risk of progressive memory disorders and reduces cardiovascular risk. This includes eating fish, eat fruits and green leafy vegetables, nuts like almonds and hazelnuts, walnuts, and also use olive oil. Avoid fast foods and fried foods as much as possible. Avoid sweets and sugar as sugar use has been linked to worsening of memory function.  There is always a concern of gradual progression of memory problems. If this is the case, then we may need to adjust level of care according to patient needs. Support, both to the patient and caregiver, should then be put into place.    The Alzheimer's Association is here all day, every day for people facing Alzheimer's disease through our free 24/7 Helpline: [phone]. The Helpline provides reliable information and support to all those who need assistance, such as individuals living with memory loss, Alzheimer's or other dementia, caregivers, health care professionals and the public.  Our highly trained and knowledgeable staff can help you with: Understanding memory loss, dementia and Alzheimer's  Medications and other treatment options  General information about aging and brain health  Skills to provide quality care and to find the best care from professionals  Legal, financial  and living-arrangement decisions Our Helpline also features: Confidential care consultation provided by master's level  clinicians who can help with decision-making support, crisis assistance and education on issues families face every day  Help in a caller's preferred language using our translation service that features more than 200 languages and dialects  Referrals to local community programs, services and ongoing support     FALL PRECAUTIONS: Be cautious when walking. Scan the area for obstacles that may increase the risk of trips and falls. When getting up in the mornings, sit up at the edge of the bed for a few minutes before getting out of bed. Consider elevating the bed at the head end to avoid drop of blood pressure when getting up. Walk always in a well-lit room (use night lights in the walls). Avoid area rugs or power cords from appliances in the middle of the walkways. Use a walker or a cane if necessary and consider physical therapy for balance exercise. Get your eyesight checked regularly.  FINANCIAL OVERSIGHT: Supervision, especially oversight when making financial decisions or transactions is also recommended.  HOME SAFETY: Consider the safety of the kitchen when operating appliances like stoves, microwave oven, and blender. Consider having supervision and share cooking responsibilities until no longer able to participate in those. Accidents with firearms and other hazards in the house should be identified and addressed as well.   ABILITY TO BE LEFT ALONE: If patient is unable to contact 911 operator, consider using LifeLine, or when the need is there, arrange for someone to stay with patients. Smoking is a fire hazard, consider supervision or cessation. Risk of wandering should be assessed by caregiver and if detected at any point, supervision and safe proof recommendations should be instituted.  MEDICATION SUPERVISION: Inability to self-administer medication needs to be constantly addressed. Implement a mechanism to ensure safe administration of the medications.   DRIVING: Regarding driving, in  patients with progressive memory problems, driving will be impaired. We advise to have someone else do the driving if trouble finding directions or if minor accidents are reported. Independent driving assessment is available to determine safety of driving.   If you are interested in the driving assessment, you can contact the following:  [employer] in [place]  [place] [phone] or [phone]      [place] refers to food and lifestyle choices that are based on the traditions of countries located on the [employer]. This way of eating has been shown to help prevent certain conditions and improve outcomes for people who have chronic diseases, like kidney disease and heart disease. What are tips for following this plan? Lifestyle  Cook and eat meals together with your family, when possible. Drink enough fluid to keep your urine clear or pale yellow. Be physically active every day. This includes: Aerobic exercise like running or swimming. Leisure activities like gardening, walking, or housework. Get 7-8 hours of sleep each night. If recommended by your health care provider, drink red wine in moderation. This means 1 glass a day for nonpregnant women and 2 glasses a day for men. A glass of wine equals 5 oz (150 mL). Reading food labels  Check the serving size of packaged foods. For foods such as rice and pasta, the serving size refers to the amount of cooked product, not dry. Check the total fat in packaged foods. Avoid foods that have saturated fat or trans fats. Check  the ingredients list for added sugars, such as corn syrup. Shopping  At the grocery store, buy most of your food from the areas near the walls of the store. This includes: Fresh fruits and vegetables (produce). Grains, beans, nuts, and seeds. Some of these may be  available in unpackaged forms or large amounts (in bulk). Fresh seafood. Poultry and eggs. Low-fat dairy products. Buy whole ingredients instead of prepackaged foods. Buy fresh fruits and vegetables in-season from local farmers markets. Buy frozen fruits and vegetables in resealable bags. If you do not have access to quality fresh seafood, buy precooked frozen shrimp or canned fish, such as tuna, salmon, or sardines. Buy small amounts of raw or cooked vegetables, salads, or olives from the deli or salad bar at your store. Stock your pantry so you always have certain foods on hand, such as olive oil, canned tuna, canned tomatoes, rice, pasta, and beans. Cooking  Cook foods with extra-virgin olive oil instead of using butter or other vegetable oils. Have meat as a side dish, and have vegetables or grains as your main dish. This means having meat in small portions or adding small amounts of meat to foods like pasta or stew. Use beans or vegetables instead of meat in common dishes like chili or lasagna. Experiment with different cooking methods. Try roasting or broiling vegetables instead of steaming or sauteing them. Add frozen vegetables to soups, stews, pasta, or rice. Add nuts or seeds for added healthy fat at each meal. You can add these to yogurt, salads, or vegetable dishes. Marinate fish or vegetables using olive oil, lemon juice, garlic, and fresh herbs. Meal planning  Plan to eat 1 vegetarian meal one day each week. Try to work up to 2 vegetarian meals, if possible. Eat seafood 2 or more times a week. Have healthy snacks readily available, such as: Vegetable sticks with hummus. Greek yogurt. Fruit and nut trail mix. Eat balanced meals throughout the week. This includes: Fruit: 2-3 servings a day Vegetables: 4-5 servings a day Low-fat dairy: 2 servings a day Fish, poultry, or lean meat: 1 serving a day Beans and legumes: 2 or more servings a week Nuts and seeds: 1-2 servings a  day Whole grains: 6-8 servings a day Extra-virgin olive oil: 3-4 servings a day Limit red meat and sweets to only a few servings a month What are my food choices? Mediterranean diet Recommended Grains: Whole-grain pasta. Brown rice. Bulgar wheat. Polenta. Couscous. Whole-wheat bread. Orpah Cobb. Vegetables: Artichokes. Beets. Broccoli. Cabbage. Carrots. Eggplant. Green beans. Chard. Kale. Spinach. Onions. Leeks. Peas. Squash. Tomatoes. Peppers. Radishes. Fruits: Apples. Apricots. Avocado. Berries. Bananas. Cherries. Dates. Figs. Grapes. Lemons. Melon. Oranges. Peaches. Plums. Pomegranate. Meats and other protein foods: Beans. Almonds. Sunflower seeds. Pine nuts. Peanuts. Cod. Salmon. Scallops. Shrimp. Tuna. Tilapia. Clams. Oysters. Eggs. Dairy: Low-fat milk. Cheese. Greek yogurt. Beverages: Water. Red wine. Herbal tea. Fats and oils: Extra virgin olive oil. Avocado oil. Grape seed oil. Sweets and desserts: [place] yogurt with honey. Baked apples. Poached pears. Trail mix. Seasoning and other foods: Basil. Cilantro. Coriander. Cumin. Mint. Parsley. Sage. Rosemary. Tarragon. Garlic. Oregano. Thyme. Pepper. Balsalmic vinegar. Tahini. Hummus. Tomato sauce. Olives. Mushrooms. Limit these Grains: Prepackaged pasta or rice dishes. Prepackaged cereal with added sugar. Vegetables: Deep fried potatoes (french fries). Fruits: Fruit canned in syrup. Meats and other protein foods: Beef. Pork. [place]. Poultry with skin. Hot dogs. Tomasa Blase. Dairy: Ice cream. Sour cream. Whole milk. Beverages: Juice. Sugar-sweetened soft drinks. Beer. Liquor and spirits. Fats and oils: Butter.  Canola oil. Vegetable oil. Beef fat (tallow). Lard. Sweets and desserts: Cookies. Cakes. Pies. Candy. Seasoning and other foods: Mayonnaise. Premade sauces and marinades. The items listed may not be a complete list. Talk with your dietitian about what dietary choices are right for you. Summary The Mediterranean diet includes both  food and lifestyle choices. Eat a variety of fresh fruits and vegetables, beans, nuts, seeds, and whole grains. Limit the amount of red meat and sweets that you eat. Talk with your health care provider about whether it is safe for you to drink red wine in moderation. This means 1 glass a day for nonpregnant women and 2 glasses a day for men. A glass of wine equals 5 oz (150 mL). This information is not intended to replace advice given to you by your health care provider. Make sure you discuss any questions you have with your health care provider. Document Released: 07/12/2016 Document Revised: 08/14/2016 Document Reviewed: 07/12/2016 Elsevier Interactive Patient Education  2017 [employer].

## 2023-01-08 ENCOUNTER — Other Ambulatory Visit: Payer: Self-pay | Admitting: Physician Assistant

## 2023-02-19 ENCOUNTER — Ambulatory Visit: Payer: Medicare HMO | Admitting: Physician Assistant

## 2023-02-19 ENCOUNTER — Encounter: Payer: Self-pay | Admitting: Physician Assistant

## 2023-02-19 VITALS — BP 149/76 | HR 63 | Resp 18 | Ht 68.0 in | Wt 163.0 lb

## 2023-02-19 DIAGNOSIS — F0280 Dementia in other diseases classified elsewhere without behavioral disturbance: Secondary | ICD-10-CM | POA: Diagnosis not present

## 2023-02-19 DIAGNOSIS — G309 Alzheimer's disease, unspecified: Secondary | ICD-10-CM

## 2023-02-19 NOTE — Progress Notes (Signed)
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 RH female with a history of  hypertension, hyperlipidemia, anemia of chronic disease, Vit D deficiency, CKD stage 3b, CAD, depression, stroke in 2017 and prior PE on AC,  B12 deficiency  seen today in follow up for dementia initially with behavioral disturbance, although these has been well-controlled with current medications.  Dementia is  likely due to Alzheimer's disease with a vascular component, and she is clinically stable. Patient is currently on memantine 10 mg twice daily.  Prior MRI brain personally reviewed was remarkable for age-related changes and old moderate chronic microvascular ischemic changes, along with small chronic right cerebellar and bilateral deep cerebral white matter infarcts, question chronic of dural defect with herniation of CSF and abnormal cerebellar parenchyma posterior to the right sigmoid sinus.   Recommendations   Follow up in 6 months. Continue memantine 10 mg twice daily Continue to monitor mood, patient is on Seroquel 25 mg nightly and Zoloft by PCP Recommend good control of cardiovascular risk factors Agree with resuming physical therapy Continue B12 supplements     Subjective:    This patient is accompanied in the office by her daughter who supplements the history.  Previous records as well as any outside records available were reviewed prior to todays visit. Patient was last seen on 08/21/2022   Any changes in memory since last visit?  "About the same ".  Daughter believes that the medication is maintaining her cognitive status stable.  She may have some difficulties remembering some conversations or names but overall she is able to communicate well. repeats oneself?  Endorsed Disoriented when walking into a room?  Sometimes she does not recognize her home, does not remember if she is in her childhood home or not, although  this does not interfere with her normal life   Leaving objects in unusual places?  Endorsed. Gives the dog regular food instead of dog food, but no significant changes in behavior  wandering behavior?  denies   Any personality changes since last visit?  denies   Any worsening depression?:  denies   Hallucinations or paranoia?  denies, with Seroquel, she has less frequent hallucinations. Seizures?    denies    Any sleep changes?  Most of the time she sleeps well.  She may have some dreams in which she made reenact, "cutting fabric as when she was working at [employer] ".  Denies nightmares or sleepwalking.  Sleep apnea?   denies   Any hygiene concerns?  Denies, uses a shower chair and helps   Independent of bathing and dressing?  She needs assistance  Does the patient needs help with medications?  Daughter is in charge   Who is in charge of the finances?  Daughter is in charge     Any changes in appetite?  denies     Patient have trouble swallowing?  denies   Does the patient cook?  Any kitchen accidents such as leaving the stove on? Patient denies   Any headaches?   denies   Chronic back pain  denies   Ambulates with difficulty?     She has a history of left foot amputation, sometimes causing mild loss of balance. At home she uses a walker, but when she was out she uses a wheelchair.  "In the past, she did physical therapy and she tried it at home, which was making a difference  to her.  She is going to start doing her exercises again.  In addition, she has been receiving injections in her knees for arthritis.  Her Recent falls or head injuries? denies     Unilateral weakness, numbness or tingling?    denies   Any tremors?  denies   Any anosmia?  Patient denies   Any incontinence of urine?  She has urge incontinence, uses depends Any bowel dysfunction?   denies      Patient lives  with her husband Does the patient drive?No longer drives     INITIAL EVALUATION 05/31/21  The patient is  seen in neurologic consultation at the request of Sharilyn Sites, MD for the evaluation of memory.  The patient is accompanied by daughter Cassandra [name] who supplements the history.   The patient is a 89 y.o. year old female who has had memory issues for about  one year per daughter's report when she began seeing people, mainly women, standing at the door.  About 4 months ago, the patient had a kidney infection accompanied by acute mental status changes, treated with antibiotics with  improvement of her symptoms.  About 3 weeks ago, "her confusion came back ".  She began forgetting names and phone numbers that she knew by heart.  Her daughter, who lives next door, reported that her mother was beating her own house, "wanted to go home", not recognizing her own environment.  She has been more irritable, has "become meaner, especially over the last 2 weeks.  This is new, since my mother used to be so easygoing "-daughter says. She sleeps "okay", wakes up 1-2 times at night and has very vivid dreams, talking in her sleep according to her husband.  No apparent sleepwalking.  Her mirror had to be covered because she confuses it with another room and wants to walk towards it. She is independent of bathing and her daughter helps her to dress up.  Cassandra [name] has taken over the medications, because she began to forget to take them especially over the last 3 weeks and the finances 3 months ago.  Her appetite is poor at least for 1 week, with failure to thrive.  No trouble swallowing.  She cooks "some" with her husband's supervision.  He denies having left accidentally the stove or the faucet.  She does leave objects in unusual places.  Recently, she placed all the empty Tupperware in the refrigerator.  She ambulates with assistance, especially "since she is due for another knee injection ".  The patient has significant arthritis, and has mobility issues.  Yesterday, she had a mechanical fall, and hit her head in the right occipital area  without LOC.  She does admit to falling often especially over the last 4 months. She does not drive, "angry about it"-daughter says.   She denies any history of headaches, double vision, dizziness, focal numbness or tingling, unilateral weakness or tremors.  Denies urine incontinence or retention.  Denies constipation or diarrhea.  Denies anosmia.  Denies any history of OSA, alcohol or tobacco.  Family history remarkable for mother who had dementia   MRI brain 06/12/21 No acute intracranial abnormality. Moderate chronic microvascular ischemic changes. Small chronic right cerebellar and bilateral deep cerebral white matter infarcts.Question of dural defect with herniation of CSF and abnormal cerebellar parenchyma posterior to the right sigmoid sinus  (chronic finding).    MRI brain 01/04/16 for stroke workup 1. 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 Left MCA, bilateral  PCA, and SCA territories are affected. Therefore, consider recent embolic event from the heart or proximal aorta (less likely posterior circulation only thromboembolism). 2. No associated hemorrhage or mass effect. 3. Otherwise no significant change in the MRI appearance of the brain since 2013.   Labs: 05/31/21  TSH 2.01 B12 133 (now on replenishment with monthly injections by PCP ) PREVIOUS MEDICATIONS:   CURRENT MEDICATIONS:  Outpatient Encounter Medications as of 02/19/2023  Medication Sig   apixaban (ELIQUIS) 5 MG TABS tablet Take 2 tablets (10 mg total) by mouth 2 (two) times daily. (Patient taking differently: Take 5 mg by mouth 2 (two) times daily.)   calcium carbonate (OS-CAL - DOSED IN MG OF ELEMENTAL CALCIUM) 1250 (500 Ca) MG tablet Take 2 tablets by mouth daily with breakfast.   cholecalciferol (VITAMIN D3) 25 MCG (1000 UNIT) tablet Take 1,000 Units by mouth daily.   famotidine (PEPCID) 20 MG tablet Take 20 mg by mouth daily.   furosemide (LASIX) 40 MG tablet Take 1 tablet (40 mg  total) by mouth daily. (Patient taking differently: Take 40 mg by mouth daily as needed for fluid.)   iron polysaccharides (NIFEREX) 150 MG capsule Take 150 mg by mouth daily.   memantine (NAMENDA) 10 MG tablet TAKE 1 TAB (10 MG ) TWICE A DAY   metoprolol succinate (TOPROL-XL) 50 MG 24 hr tablet Take 50 mg by mouth daily.   Multiple Vitamins-Minerals (ICAPS PO) Take 1 capsule by mouth daily.   QUEtiapine (SEROQUEL) 25 MG tablet TAKE 0.5 TABLETS BY MOUTH AT BEDTIME.   ROCKLATAN 0.02-0.005 % SOLN Place 1 drop into both eyes at bedtime.   sertraline (ZOLOFT) 100 MG tablet Take 100 mg by mouth daily.    No facility-administered encounter medications on file as of 02/19/2023.        No data to display            05/31/2021   12:00 PM  Montreal Cognitive Assessment   Visuospatial/ Executive (0/5) 1  Naming (0/3) 3  Attention: Read list of digits (0/2) 0  Attention: Read list of letters (0/1) 0  Attention: Serial 7 subtraction starting at 100 (0/3) 0  Language: Repeat phrase (0/2) 0  Language : Fluency (0/1) 0  Abstraction (0/2) 0  Delayed Recall (0/5) 0  Orientation (0/6) 1  Total 5  Adjusted Score (based on education) 6    Objective:     PHYSICAL EXAMINATION:    VITALS:   Vitals:   02/19/23 1105  BP: (!) 149/76  Pulse: 63  Resp: 18  SpO2: 97%  Weight: 163 lb (73.9 kg)  Height: 5\' 8"  (1.727 m)    GEN:  The patient appears stated age and is in NAD. HEENT:  Normocephalic, atraumatic.   Neurological examination:  General: NAD, well-groomed, appears stated age. Orientation: The patient is alert. Oriented to person, not to place and date Cranial nerves: There is good facial symmetry.The speech is fluent and clear. No aphasia or dysarthria. Fund of knowledge is reduced. Recent and remote memory are impaired. Attention and concentration are reduced.  Able to name objects and repeat phrases.  Hearing is intact to conversational tone.   Sensation: Sensation is intact to  light touch throughout Motor: Strength is at least antigravity x4. DTR's 2/4 in UE/LE     Movement examination: Tone: There is normal tone in the UE/LE Abnormal movements:  no tremor.  No myoclonus.  No asterixis.   Coordination:  There is no decremation with RAM's. Normal  finger to nose  Gait and Station: The patient has difficulty arising out of a deep-seated chair without the use of the hands.  Her steps are short, and she is hesitant to walk, needs assistance.  She is in a wheelchair today, she has left TM amputation   Thank you for allowing [place] the opportunity to participate in the care of this nice patient. Please do not hesitate to contact us for any questions or concerns.   Total time spent on today's visit was 30 minutes dedicated to this patient today, preparing to see patient, examining the patient, ordering tests and/or medications and counseling the patient, documenting clinical information in the EHR or other health record, independently interpreting results and communicating results to the patient/family, discussing treatment and goals, answering patient's questions and coordinating care.  Cc:  Sharilyn Sites, MD  Sharene Butters 02/19/2023 11:13 AM

## 2023-02-19 NOTE — Patient Instructions (Signed)
It was a pleasure to see you today at our office.   Recommendations:  Follow up in  6 months Continue Memantine 10 mg twice daily.Side effects were discussed  Continue B12 Continue Seroquel and Sertraline  Recommend resuming Physical Therapy   Whom to call:  Memory  decline, memory medications: Call our office [phone]   For psychiatric meds, mood meds: Please have your primary care physician manage these medications.   Counseling regarding caregiver distress, including caregiver depression, anxiety and issues regarding community resources, adult day care programs, adult living facilities, or memory care questions:   Feel free to contact [place], Social Worker at [phone]   For assessment of decision of mental capacity and competency:  Call Dr. Anthoney Harada, geriatric psychiatrist at [phone]  For guidance in geriatric dementia issues please call [facility] [phone]  For guidance regarding [facility] and if placement were needed at the facility, contact Arnell Asal, Social Worker tel: [phone]  If you have any severe symptoms of a stroke, or other severe issues such as confusion,severe chills or fever, etc call 911 or go to the ER as you may need to be evaluated further   Feel free to visit Facebook page " [email or web address]" for tips of how to care for people with memory problems.   Feel free to go to the following database for funded clinical studies conducted around the world: http://saunders.com/   https://www.triadclinicaltrials.com/     RECOMMENDATIONS FOR ALL PATIENTS WITH MEMORY PROBLEMS: 1. Continue to exercise (Recommend 30 minutes of walking everyday, or 3 hours every week) 2. Increase social interactions - continue going to [place] and enjoy social gatherings with friends and family 3. Eat healthy, avoid fried foods and eat more fruits and vegetables 4. Maintain adequate blood pressure, blood sugar,  and blood cholesterol level. Reducing the risk of stroke and cardiovascular disease also helps promoting better memory. 5. Avoid stressful situations. Live a simple life and avoid aggravations. Organize your time and prepare for the next day in anticipation. 6. Sleep well, avoid any interruptions of sleep and avoid any distractions in the bedroom that may interfere with adequate sleep quality 7. Avoid sugar, avoid sweets as there is a strong link between excessive sugar intake, diabetes, and cognitive impairment We discussed the Mediterranean diet, which has been shown to help patients reduce the risk of progressive memory disorders and reduces cardiovascular risk. This includes eating fish, eat fruits and green leafy vegetables, nuts like almonds and hazelnuts, walnuts, and also use olive oil. Avoid fast foods and fried foods as much as possible. Avoid sweets and sugar as sugar use has been linked to worsening of memory function.  There is always a concern of gradual progression of memory problems. If this is the case, then we may need to adjust level of care according to patient needs. Support, both to the patient and caregiver, should then be put into place.    The Alzheimer[employer] Association is here all day, every day for people facing Alzheimer's disease through our free 24/7 Helpline: 737-163-6506. The Helpline provides reliable information and support to all those who need assistance, such as individuals living with memory loss, Alzheimer's or other dementia, caregivers, health care professionals and the public.  Our highly trained and knowledgeable staff can help you with: Understanding memory loss, dementia and Alzheimer's  Medications and other treatment options  General information about aging and brain health  Skills to provide quality care and to find the best care  from Training and development officer, financial and living-arrangement decisions Our Helpline also features: Confidential care  consultation provided by master's level clinicians who can help with decision-making support, crisis assistance and education on issues families face every day  Help in a caller's preferred language using our translation service that features more than 200 languages and dialects  Referrals to local community programs, services and ongoing support     FALL PRECAUTIONS: Be cautious when walking. Scan the area for obstacles that may increase the risk of trips and falls. When getting up in the mornings, sit up at the edge of the bed for a few minutes before getting out of bed. Consider elevating the bed at the head end to avoid drop of blood pressure when getting up. Walk always in a well-lit room (use night lights in the walls). Avoid area rugs or power cords from appliances in the middle of the walkways. Use a walker or a cane if necessary and consider physical therapy for balance exercise. Get your eyesight checked regularly.  FINANCIAL OVERSIGHT: Supervision, especially oversight when making financial decisions or transactions is also recommended.  HOME SAFETY: Consider the safety of the kitchen when operating appliances like stoves, microwave oven, and blender. Consider having supervision and share cooking responsibilities until no longer able to participate in those. Accidents with firearms and other hazards in the house should be identified and addressed as well.   ABILITY TO BE LEFT ALONE: If patient is unable to contact 911 operator, consider using LifeLine, or when the need is there, arrange for someone to stay with patients. Smoking is a fire hazard, consider supervision or cessation. Risk of wandering should be assessed by caregiver and if detected at any point, supervision and safe proof recommendations should be instituted.  MEDICATION SUPERVISION: Inability to self-administer medication needs to be constantly addressed. Implement a mechanism to ensure safe administration of the  medications.   DRIVING: Regarding driving, in patients with progressive memory problems, driving will be impaired. We advise to have someone else do the driving if trouble finding directions or if minor accidents are reported. Independent driving assessment is available to determine safety of driving.   If you are interested in the driving assessment, you can contact the following:  [employer] in [place]  [place] [phone] or [phone]      Atkins refers to food and lifestyle choices that are based on the traditions of countries located on the [employer]. This way of eating has been shown to help prevent certain conditions and improve outcomes for people who have chronic diseases, like kidney disease and heart disease. What are tips for following this plan? Lifestyle  Cook and eat meals together with your family, when possible. Drink enough fluid to keep your urine clear or pale yellow. Be physically active every day. This includes: Aerobic exercise like running or swimming. Leisure activities like gardening, walking, or housework. Get 7-8 hours of sleep each night. If recommended by your health care provider, drink red wine in moderation. This means 1 glass a day for nonpregnant women and 2 glasses a day for men. A glass of wine equals 5 oz (150 mL). Reading food labels  Check the serving size of packaged foods. For foods such as rice and pasta, the serving size refers to the amount of cooked product, not dry. Check the total fat in packaged foods. Avoid foods that have saturated  fat or trans fats. Check the ingredients list for added sugars, such as corn syrup. Shopping  At the grocery store, buy most of your food from the areas near the walls of the store. This includes: Fresh fruits and vegetables  (produce). Grains, beans, nuts, and seeds. Some of these may be available in unpackaged forms or large amounts (in bulk). Fresh seafood. Poultry and eggs. Low-fat dairy products. Buy whole ingredients instead of prepackaged foods. Buy fresh fruits and vegetables in-season from local farmers markets. Buy frozen fruits and vegetables in resealable bags. If you do not have access to quality fresh seafood, buy precooked frozen shrimp or canned fish, such as tuna, salmon, or sardines. Buy small amounts of raw or cooked vegetables, salads, or olives from the deli or salad bar at your store. Stock your pantry so you always have certain foods on hand, such as olive oil, canned tuna, canned tomatoes, rice, pasta, and beans. Cooking  Cook foods with extra-virgin olive oil instead of using butter or other vegetable oils. Have meat as a side dish, and have vegetables or grains as your main dish. This means having meat in small portions or adding small amounts of meat to foods like pasta or stew. Use beans or vegetables instead of meat in common dishes like chili or lasagna. Experiment with different cooking methods. Try roasting or broiling vegetables instead of steaming or sauteing them. Add frozen vegetables to soups, stews, pasta, or rice. Add nuts or seeds for added healthy fat at each meal. You can add these to yogurt, salads, or vegetable dishes. Marinate fish or vegetables using olive oil, lemon juice, garlic, and fresh herbs. Meal planning  Plan to eat 1 vegetarian meal one day each week. Try to work up to 2 vegetarian meals, if possible. Eat seafood 2 or more times a week. Have healthy snacks readily available, such as: Vegetable sticks with hummus. Greek yogurt. Fruit and nut trail mix. Eat balanced meals throughout the week. This includes: Fruit: 2-3 servings a day Vegetables: 4-5 servings a day Low-fat dairy: 2 servings a day Fish, poultry, or lean meat: 1 serving a day Beans and  legumes: 2 or more servings a week Nuts and seeds: 1-2 servings a day Whole grains: 6-8 servings a day Extra-virgin olive oil: 3-4 servings a day Limit red meat and sweets to only a few servings a month What are my food choices? Mediterranean diet Recommended Grains: Whole-grain pasta. Brown rice. Bulgar wheat. Polenta. Couscous. Whole-wheat bread. [place]. Vegetables: Artichokes. Beets. Broccoli. Cabbage. Carrots. Eggplant. Green beans. Chard. Kale. Spinach. Onions. Leeks. Peas. Squash. Tomatoes. Peppers. Radishes. Fruits: Apples. Apricots. Avocado. Berries. Bananas. Cherries. Dates. Figs. Grapes. Lemons. Melon. Oranges. Peaches. Plums. Pomegranate. Meats and other protein foods: Beans. Almonds. Sunflower seeds. Pine nuts. Peanuts. [place]. Salmon. Scallops. Shrimp. [place]. Tilapia. Clams. Oysters. Eggs. Dairy: Low-fat milk. Cheese. Greek yogurt. Beverages: Water. Red wine. Herbal tea. Fats and oils: Extra virgin olive oil. Avocado oil. Grape seed oil. Sweets and desserts: [place] yogurt with honey. Baked apples. Poached pears. Trail mix. Seasoning and other foods: Basil. Cilantro. Coriander. Cumin. Mint. Parsley. Sage. Rosemary. Tarragon. Garlic. Oregano. Thyme. Pepper. Balsalmic vinegar. Tahini. Hummus. Tomato sauce. Olives. Mushrooms. Limit these Grains: Prepackaged pasta or rice dishes. Prepackaged cereal with added sugar. Vegetables: Deep fried potatoes (french fries). Fruits: Fruit canned in syrup. Meats and other protein foods: Beef. Pork. Lamb. Poultry with skin. Hot dogs. [place] Salines. Dairy: Ice cream. Sour cream. Whole milk. Beverages: Juice. Sugar-sweetened soft drinks. Beer. Liquor and  spirits. Fats and oils: Butter. Canola oil. Vegetable oil. Beef fat (tallow). Lard. Sweets and desserts: Cookies. Cakes. Pies. Candy. Seasoning and other foods: Mayonnaise. Premade sauces and marinades. The items listed may not be a complete list. Talk with your dietitian about what dietary choices  are right for you. Summary The Mediterranean diet includes both food and lifestyle choices. Eat a variety of fresh fruits and vegetables, beans, nuts, seeds, and whole grains. Limit the amount of red meat and sweets that you eat. Talk with your health care provider about whether it is safe for you to drink red wine in moderation. This means 1 glass a day for nonpregnant women and 2 glasses a day for men. A glass of wine equals 5 oz (150 mL). This information is not intended to replace advice given to you by your health care provider. Make sure you discuss any questions you have with your health care provider. Document Released: 07/12/2016 Document Revised: 08/14/2016 Document Reviewed: 07/12/2016 Elsevier Interactive Patient Education  2017 [employer].

## 2023-06-16 ENCOUNTER — Other Ambulatory Visit: Payer: Self-pay | Admitting: Physician Assistant

## 2023-08-22 ENCOUNTER — Ambulatory Visit: Payer: Medicare HMO | Admitting: Physician Assistant

## 2023-08-27 ENCOUNTER — Encounter: Payer: Self-pay | Admitting: Physician Assistant

## 2023-08-27 ENCOUNTER — Ambulatory Visit: Payer: Medicare HMO | Admitting: Physician Assistant

## 2023-08-27 VITALS — BP 123/79 | HR 74 | Resp 18 | Ht 68.0 in

## 2023-08-27 DIAGNOSIS — G309 Alzheimer's disease, unspecified: Secondary | ICD-10-CM

## 2023-08-27 DIAGNOSIS — F0280 Dementia in other diseases classified elsewhere without behavioral disturbance: Secondary | ICD-10-CM

## 2023-08-27 NOTE — Patient Instructions (Signed)
It was a pleasure to see you today at our office.   Recommendations:  Follow up in  6 months Continue Memantine 10 mg twice daily.Side effects were discussed  Continue B12 Continue Seroquel and Sertraline  Recommend resuming Physical Therapy   Whom to call:  Memory  decline, memory medications: Call our office [phone]   For psychiatric meds, mood meds: Please have your primary care physician manage these medications.   Counseling regarding caregiver distress, including caregiver depression, anxiety and issues regarding community resources, adult day care programs, adult living facilities, or memory care questions:   Feel free to contact Misty Lisabeth Register, Social Worker at [phone]   For assessment of decision of mental capacity and competency:  Call Dr. Erick Blinks, geriatric psychiatrist at [phone]  For guidance in geriatric dementia issues please call [facility] [phone]  For guidance regarding [facility] and if placement were needed at the facility, contact Sidney Ace, Social Worker tel: [phone]  If you have any severe symptoms of a stroke, or other severe issues such as confusion,severe chills or fever, etc call 911 or go to the ER as you may need to be evaluated further   Feel free to visit Facebook page " [employer]" for tips of how to care for people with memory problems.   Feel free to go to the following database for funded clinical studies conducted around the world: RankChecks.se   https://www.triadclinicaltrials.com/     RECOMMENDATIONS FOR ALL PATIENTS WITH MEMORY PROBLEMS: 1. Continue to exercise (Recommend 30 minutes of walking everyday, or 3 hours every week) 2. Increase social interactions - continue going to [place] and enjoy social gatherings with friends and family 3. Eat healthy, avoid fried foods and eat more fruits and vegetables 4. Maintain adequate blood pressure, blood sugar,  and blood cholesterol level. Reducing the risk of stroke and cardiovascular disease also helps promoting better memory. 5. Avoid stressful situations. Live a simple life and avoid aggravations. Organize your time and prepare for the next day in anticipation. 6. Sleep well, avoid any interruptions of sleep and avoid any distractions in the bedroom that may interfere with adequate sleep quality 7. Avoid sugar, avoid sweets as there is a strong link between excessive sugar intake, diabetes, and cognitive impairment We discussed the Mediterranean diet, which has been shown to help patients reduce the risk of progressive memory disorders and reduces cardiovascular risk. This includes eating fish, eat fruits and green leafy vegetables, nuts like almonds and hazelnuts, walnuts, and also use olive oil. Avoid fast foods and fried foods as much as possible. Avoid sweets and sugar as sugar use has been linked to worsening of memory function.  There is always a concern of gradual progression of memory problems. If this is the case, then we may need to adjust level of care according to patient needs. Support, both to the patient and caregiver, should then be put into place.    The Alzheimer's Association is here all day, every day for people facing Alzheimer's disease through our free 24/7 Helpline: 936-008-7212. The Helpline provides reliable information and support to all those who need assistance, such as individuals living with memory loss, Alzheimer's or other dementia, caregivers, health care professionals and the public.  Our highly trained and knowledgeable staff can help you with: Understanding memory loss, dementia and Alzheimer's  Medications and other treatment options  General information about aging and brain health  Skills to provide quality care and to find the best care  from Nurse, adult, financial and living-arrangement decisions Our Helpline also features: Confidential care  consultation provided by master's level clinicians who can help with decision-making support, crisis assistance and education on issues families face every day  Help in a caller's preferred language using our translation service that features more than 200 languages and dialects  Referrals to local community programs, services and ongoing support     FALL PRECAUTIONS: Be cautious when walking. Scan the area for obstacles that may increase the risk of trips and falls. When getting up in the mornings, sit up at the edge of the bed for a few minutes before getting out of bed. Consider elevating the bed at the head end to avoid drop of blood pressure when getting up. Walk always in a well-lit room (use night lights in the walls). Avoid area rugs or power cords from appliances in the middle of the walkways. Use a walker or a cane if necessary and consider physical therapy for balance exercise. Get your eyesight checked regularly.  FINANCIAL OVERSIGHT: Supervision, especially oversight when making financial decisions or transactions is also recommended.  HOME SAFETY: Consider the safety of the kitchen when operating appliances like stoves, microwave oven, and blender. Consider having supervision and share cooking responsibilities until no longer able to participate in those. Accidents with firearms and other hazards in the house should be identified and addressed as well.   ABILITY TO BE LEFT ALONE: If patient is unable to contact 911 operator, consider using LifeLine, or when the need is there, arrange for someone to stay with patients. Smoking is a fire hazard, consider supervision or cessation. Risk of wandering should be assessed by caregiver and if detected at any point, supervision and safe proof recommendations should be instituted.  MEDICATION SUPERVISION: Inability to self-administer medication needs to be constantly addressed. Implement a mechanism to ensure safe administration of the  medications.   DRIVING: Regarding driving, in patients with progressive memory problems, driving will be impaired. We advise to have someone else do the driving if trouble finding directions or if minor accidents are reported. Independent driving assessment is available to determine safety of driving.   If you are interested in the driving assessment, you can contact the following:  [employer] in [place] [phone]  [facility] [phone]  [facility] [phone] or [phone]      Mediterranean Diet A Mediterranean diet refers to food and lifestyle choices that are based on the traditions of countries located on the [employer]. This way of eating has been shown to help prevent certain conditions and improve outcomes for people who have chronic diseases, like kidney disease and heart disease. What are tips for following this plan? Lifestyle  Cook and eat meals together with your family, when possible. Drink enough fluid to keep your urine clear or pale yellow. Be physically active every day. This includes: Aerobic exercise like running or swimming. Leisure activities like gardening, walking, or housework. Get 7-8 hours of sleep each night. If recommended by your health care provider, drink red wine in moderation. This means 1 glass a day for nonpregnant women and 2 glasses a day for men. A glass of wine equals 5 oz (150 mL). Reading food labels  Check the serving size of packaged foods. For foods such as rice and pasta, the serving size refers to the amount of cooked product, not dry. Check the total fat in packaged foods. Avoid foods that have saturated  fat or trans fats. Check the ingredients list for added sugars, such as corn syrup. Shopping  At the grocery store, buy most of your food from the areas near the walls of the store. This includes: Fresh fruits and vegetables  (produce). Grains, beans, nuts, and seeds. Some of these may be available in unpackaged forms or large amounts (in bulk). Fresh seafood. Poultry and eggs. Low-fat dairy products. Buy whole ingredients instead of prepackaged foods. Buy fresh fruits and vegetables in-season from local farmers markets. Buy frozen fruits and vegetables in resealable bags. If you do not have access to quality fresh seafood, buy precooked frozen shrimp or canned fish, such as tuna, salmon, or sardines. Buy small amounts of raw or cooked vegetables, salads, or olives from the deli or salad bar at your store. Stock your pantry so you always have certain foods on hand, such as olive oil, canned tuna, canned tomatoes, rice, pasta, and beans. Cooking  Cook foods with extra-virgin olive oil instead of using butter or other vegetable oils. Have meat as a side dish, and have vegetables or grains as your main dish. This means having meat in small portions or adding small amounts of meat to foods like pasta or stew. Use beans or vegetables instead of meat in common dishes like chili or lasagna. Experiment with different cooking methods. Try roasting or broiling vegetables instead of steaming or sauteing them. Add frozen vegetables to soups, stews, pasta, or rice. Add nuts or seeds for added healthy fat at each meal. You can add these to yogurt, salads, or vegetable dishes. Marinate fish or vegetables using olive oil, lemon juice, garlic, and fresh herbs. Meal planning  Plan to eat 1 vegetarian meal one day each week. Try to work up to 2 vegetarian meals, if possible. Eat seafood 2 or more times a week. Have healthy snacks readily available, such as: Vegetable sticks with hummus. Greek yogurt. Fruit and nut trail mix. Eat balanced meals throughout the week. This includes: Fruit: 2-3 servings a day Vegetables: 4-5 servings a day Low-fat dairy: 2 servings a day Fish, poultry, or lean meat: 1 serving a day Beans and  legumes: 2 or more servings a week Nuts and seeds: 1-2 servings a day Whole grains: 6-8 servings a day Extra-virgin olive oil: 3-4 servings a day Limit red meat and sweets to only a few servings a month What are my food choices? Mediterranean diet Recommended Grains: Whole-grain pasta. Brown rice. Bulgar wheat. Polenta. Couscous. Whole-wheat bread. Orpah Cobb. Vegetables: Artichokes. Beets. Broccoli. Cabbage. Carrots. Eggplant. Green beans. Chard. Kale. Spinach. Onions. Leeks. Peas. Squash. Tomatoes. Peppers. Radishes. Fruits: Apples. Apricots. Avocado. Berries. Bananas. Cherries. Dates. Figs. Grapes. Lemons. Melon. Oranges. Peaches. Plums. Pomegranate. Meats and other protein foods: Beans. Almonds. Sunflower seeds. Pine nuts. Peanuts. Cod. Salmon. Scallops. Shrimp. Tuna. Tilapia. Clams. Oysters. Eggs. Dairy: Low-fat milk. Cheese. Greek yogurt. Beverages: Water. Red wine. Herbal tea. Fats and oils: Extra virgin olive oil. Avocado oil. Grape seed oil. Sweets and desserts: [place] yogurt with honey. Baked apples. Poached pears. Trail mix. Seasoning and other foods: Basil. Cilantro. Coriander. Cumin. Mint. Parsley. Sage. Rosemary. Tarragon. Garlic. Oregano. Thyme. Pepper. Balsalmic vinegar. Tahini. Hummus. Tomato sauce. Olives. Mushrooms. Limit these Grains: Prepackaged pasta or rice dishes. Prepackaged cereal with added sugar. Vegetables: Deep fried potatoes (french fries). Fruits: Fruit canned in syrup. Meats and other protein foods: Beef. Pork. Lamb. Poultry with skin. Hot dogs. Tomasa Blase. Dairy: Ice cream. Sour cream. Whole milk. Beverages: Juice. Sugar-sweetened soft drinks. Beer. Liquor and  spirits. Fats and oils: Butter. Canola oil. Vegetable oil. Beef fat (tallow). Lard. Sweets and desserts: Cookies. Cakes. Pies. Candy. Seasoning and other foods: Mayonnaise. Premade sauces and marinades. The items listed may not be a complete list. Talk with your dietitian about what dietary choices  are right for you. Summary The Mediterranean diet includes both food and lifestyle choices. Eat a variety of fresh fruits and vegetables, beans, nuts, seeds, and whole grains. Limit the amount of red meat and sweets that you eat. Talk with your health care provider about whether it is safe for you to drink red wine in moderation. This means 1 glass a day for nonpregnant women and 2 glasses a day for men. A glass of wine equals 5 oz (150 mL). This information is not intended to replace advice given to you by your health care provider. Make sure you discuss any questions you have with your health care provider. Document Released: 07/12/2016 Document Revised: 08/14/2016 Document Reviewed: 07/12/2016 Elsevier Interactive Patient Education  2017 [employer].

## 2023-08-27 NOTE — Progress Notes (Signed)
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 RH female with a history of hypertension, hyperlipidemia, anemia of chronic disease, Vit D deficiency, CKD stage 3b, CAD, depression, stroke in 2017 and prior PE on AC,  B12 deficiency  seen today in follow up for memory loss. Patient is currently on memantine 10 mg twice a day. Memory is stable.    Follow up in 6  months. Continue  memantine 10 mg twice daily.  Side effects discussed  Recommend good control of her cardiovascular risk factors Continue to control mood as per PCP, she is on Seroquel, Zoloft Recommend resuming PT for strength and balance, patient declines  Continue B12 supplementation      Subjective:    This patient is accompanied in the office by her daughter who supplements the history.  Previous records as well as any outside records available were reviewed prior to todays visit. Patient was last seen on 02/19/23   Any changes in memory since last visit? "About the same". Patient has some difficulty remembering recent conversations and people names, but overall she is able to communicate fairly well. "She recognizes everybody" repeats oneself?  Endorsed Disoriented when walking into a room? Sometimes she does not recognize her own home, thinking is her childhood home. "Not as bad as before"  Leaving objects in unusual places?  May misplace things but not in unusual places   Wandering behavior?  Denies. Her knees are bad so she cannot walk as much.    Any personality changes since last visit?  denies   Any worsening depression?:  Denies.   Hallucinations or paranoia?  Endorsed, less frequent with Seroquel.    Seizures? denies    Any sleep changes?  Sleeps well, but sometimes she may have vivid dreams with reenactment REM behavior or sleepwalking   Sleep apnea?   Denies.   Any hygiene concerns? Denies. Uses shower chair. Independent of  bathing and dressing?  Endorsed  Does the patient needs help with medications? Daughter  is in charge   Who is in charge of the finances? Daughter  is in charge     Any changes in appetite? Eats fairly good, but sometimes she is not as hungry.  Patient have trouble swallowing? Denies.   Does the patient cook? No. Any headaches?   Denies.   Chronic back pain  denies..    Ambulates with difficulty? She uses the walker at home and wheelchair outside. She has declined PT. She received injections to her knees for arthritis.  Recent falls or head injuries? Denies.      Unilateral weakness, numbness or tingling? denies   Any tremors?  Denies   Any anosmia?  Denies   Any incontinence of urine?  Endorsed, urge incontinence, wears diapers Any bowel dysfunction?   Denies      Patient lives with her husband   Does the patient drive? No longer drives       INITIAL EVALUATION 05/31/21  The patient is seen in neurologic consultation at the request of [name], MD for the evaluation of memory.  The patient is accompanied by daughter Cassandra [name] who supplements the history.   The patient is a 89 y.o. year old female who has had memory issues for about  one year per daughter's report when she began seeing people, mainly women, standing at the door.  About 4 months ago, the patient had a kidney  infection accompanied by acute mental status changes, treated with antibiotics with  improvement of her symptoms.  About 3 weeks ago, "her confusion came back ".  She began forgetting names and phone numbers that she knew by heart.  Her daughter, who lives next door, reported that her mother was beating her own house, "wanted to go home", not recognizing her own environment.  She has been more irritable, has "become meaner, especially over the last 2 weeks.  This is new, since my mother used to be so easygoing "-daughter says. She sleeps "okay", wakes up 1-2 times at night and has very vivid dreams, talking in her sleep  according to her husband.  No apparent sleepwalking.  Her mirror had to be covered because she confuses it with another room and wants to walk towards it. She is independent of bathing and her daughter helps her to dress up.  Cassandra [name] has taken over the medications, because she began to forget to take them especially over the last 3 weeks and the finances 3 months ago.  Her appetite is poor at least for 1 week, with failure to thrive.  No trouble swallowing.  She cooks "some" with her husband's supervision.  He denies having left accidentally the stove or the faucet.  She does leave objects in unusual places.  Recently, she placed all the empty Tupperware in the refrigerator.  She ambulates with assistance, especially "since she is due for another knee injection ".  The patient has significant arthritis, and has mobility issues.  Yesterday, she had a mechanical fall, and hit her head in the right occipital area without LOC.  She does admit to falling often especially over the last 4 months. She does not drive, "angry about it"-daughter says.   She denies any history of headaches, double vision, dizziness, focal numbness or tingling, unilateral weakness or tremors.  Denies urine incontinence or retention.  Denies constipation or diarrhea.  Denies anosmia.  Denies any history of OSA, alcohol or tobacco.  Family history remarkable for mother who had dementia   MRI brain 06/12/21 No acute intracranial abnormality. Moderate chronic microvascular ischemic changes. Small chronic right cerebellar and bilateral deep cerebral white matter infarcts.Question of dural defect with herniation of CSF and abnormal cerebellar parenchyma posterior to the right sigmoid sinus  (chronic finding).    MRI brain 01/04/16 for stroke workup 1. 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 Left MCA, bilateral PCA, and SCA territories are affected. Therefore, consider recent embolic event from the heart or  proximal aorta (less likely posterior circulation only thromboembolism). 2. No associated hemorrhage or mass effect. 3. Otherwise no significant change in the MRI appearance of the brain since 2013.   Labs: 05/31/21  TSH 2.01 B12 133 (now on replenishment with monthly injections by PCP )   PREVIOUS MEDICATIONS:   CURRENT MEDICATIONS:  Outpatient Encounter Medications as of 08/27/2023  Medication Sig   apixaban (ELIQUIS) 5 MG TABS tablet Take 2 tablets (10 mg total) by mouth 2 (two) times daily. (Patient taking differently: Take 5 mg by mouth 2 (two) times daily.)   calcium carbonate (OS-CAL - DOSED IN MG OF ELEMENTAL CALCIUM) 1250 (500 Ca) MG tablet Take 2 tablets by mouth daily with breakfast.   cholecalciferol (VITAMIN D3) 25 MCG (1000 UNIT) tablet Take 1,000 Units by mouth daily.   cyanocobalamin (VITAMIN B12) 1000 MCG/ML injection SMARTSIG:1000 MCG IM Once a Month   famotidine (PEPCID) 20 MG tablet Take 20 mg by mouth  daily.   furosemide (LASIX) 40 MG tablet Take 1 tablet (40 mg total) by mouth daily. (Patient taking differently: Take 40 mg by mouth daily as needed for fluid.)   iron polysaccharides (NIFEREX) 150 MG capsule Take 150 mg by mouth daily.   memantine (NAMENDA) 10 MG tablet TAKE 1 TAB TWICE A DAY   metoprolol succinate (TOPROL-XL) 50 MG 24 hr tablet Take 50 mg by mouth daily.   Multiple Vitamins-Minerals (ICAPS PO) Take 1 capsule by mouth daily.   QUEtiapine (SEROQUEL) 25 MG tablet TAKE 0.5 TABLETS BY MOUTH AT BEDTIME.   ROCKLATAN 0.02-0.005 % SOLN Place 1 drop into both eyes at bedtime.   sertraline (ZOLOFT) 100 MG tablet Take 100 mg by mouth daily.    No facility-administered encounter medications on file as of 08/27/2023.        No data to display            05/31/2021   12:00 PM  Montreal Cognitive Assessment   Visuospatial/ Executive (0/5) 1  Naming (0/3) 3  Attention: Read list of digits (0/2) 0  Attention: Read list of letters (0/1) 0  Attention: Serial  7 subtraction starting at 100 (0/3) 0  Language: Repeat phrase (0/2) 0  Language : Fluency (0/1) 0  Abstraction (0/2) 0  Delayed Recall (0/5) 0  Orientation (0/6) 1  Total 5  Adjusted Score (based on education) 6    Objective:     PHYSICAL EXAMINATION:    VITALS:   Vitals:   08/27/23 1108  BP: 123/79  Pulse: 74  Resp: 18  SpO2: 98%  Height: 5\' 8"  (1.727 m)    GEN:  The patient appears stated age and is in NAD. HEENT:  Normocephalic, atraumatic.   Neurological examination:  General: NAD, well-groomed, appears stated age. Orientation: The patient is alert. Oriented to person, not to place and date Cranial nerves: There is good facial symmetry.The speech is fluent and clear. No aphasia or dysarthria. Fund of knowledge is appropriate. Recent and remote memory are impaired. Attention and concentration are reduced.  Able to name objects and unable to repeat phrases.  Hearing is intact to conversational tone.  Sensation: Sensation is intact to light touch, has L TM amputation  Motor: Strength is at least antigravity x4. DTR's 2/4 in UE/LE     Movement examination: Tone: There is normal tone in the UE/LE Abnormal movements:  no tremor.  No myoclonus.  No asterixis.   Coordination:  There is no decremation with RAM's. Abnormal finger to nose  Gait and Station: she is in wheelchair, unable to stand, gait has not been tested    Thank you for allowing [place] the opportunity to participate in the care of this nice patient. Please do not hesitate to contact us for any questions or concerns.   Total time spent on today's visit was 21 minutes dedicated to this patient today, preparing to see patient, examining the patient, ordering tests and/or medications and counseling the patient, documenting clinical information in the EHR or other health record, independently interpreting results and communicating results to the patient/family, discussing treatment and goals, answering patient's  questions and coordinating care.  Cc:  [name], MD  Marlowe Kays 08/27/2023 6:06 PM

## 2023-10-17 ENCOUNTER — Other Ambulatory Visit: Payer: Self-pay | Admitting: Physician Assistant

## 2023-11-20 ENCOUNTER — Telehealth: Payer: Self-pay | Admitting: Physician Assistant

## 2023-11-20 NOTE — Telephone Encounter (Signed)
Pt's daughter called in to let us know the pt passed away yesterday. The pt's daughter said Cassandra [name] was always very compassionate and she appreciates that.

## 2023-12-04 DEATH — deceased

## 2024-02-25 ENCOUNTER — Ambulatory Visit: Payer: Medicare HMO | Admitting: Physician Assistant
